# Patient Record
Sex: Male | Born: 1950 | Race: White | Hispanic: No | Marital: Married | Smoking: Never smoker
Health system: Southern US, Community
[De-identification: ages and names within clinical notes are randomized; demographics above are authoritative.]

## PROBLEM LIST (undated history)

## (undated) DIAGNOSIS — I219 Acute myocardial infarction, unspecified: Secondary | ICD-10-CM

## (undated) DIAGNOSIS — E119 Type 2 diabetes mellitus without complications: Secondary | ICD-10-CM

## (undated) DIAGNOSIS — E785 Hyperlipidemia, unspecified: Secondary | ICD-10-CM

## (undated) DIAGNOSIS — K219 Gastro-esophageal reflux disease without esophagitis: Secondary | ICD-10-CM

## (undated) DIAGNOSIS — I10 Essential (primary) hypertension: Secondary | ICD-10-CM

## (undated) DIAGNOSIS — M25512 Pain in left shoulder: Secondary | ICD-10-CM

## (undated) DIAGNOSIS — C4491 Basal cell carcinoma of skin, unspecified: Secondary | ICD-10-CM

## (undated) HISTORY — PX: CATARACT EXTRACTION: SUR2

## (undated) HISTORY — DX: Hyperlipidemia, unspecified: E78.5

## (undated) HISTORY — PX: EYE SURGERY: SHX253

## (undated) HISTORY — PX: CORONARY ANGIOPLASTY: SHX604

## (undated) HISTORY — DX: Pain in left shoulder: M25.512

## (undated) HISTORY — DX: Gastro-esophageal reflux disease without esophagitis: K21.9

## (undated) HISTORY — PX: RETINAL DETACHMENT SURGERY: SHX105

## (undated) HISTORY — DX: Basal cell carcinoma of skin, unspecified: C44.91

## (undated) HISTORY — DX: Acute myocardial infarction, unspecified: I21.9

## (undated) HISTORY — DX: Essential (primary) hypertension: I10

---

## 2006-04-26 ENCOUNTER — Ambulatory Visit: Payer: Self-pay | Admitting: Unknown Physician Specialty

## 2012-05-30 DIAGNOSIS — E669 Obesity, unspecified: Secondary | ICD-10-CM | POA: Insufficient documentation

## 2014-06-06 DIAGNOSIS — I2510 Atherosclerotic heart disease of native coronary artery without angina pectoris: Secondary | ICD-10-CM | POA: Insufficient documentation

## 2014-06-06 DIAGNOSIS — I10 Essential (primary) hypertension: Secondary | ICD-10-CM | POA: Insufficient documentation

## 2017-02-10 DIAGNOSIS — N5203 Combined arterial insufficiency and corporo-venous occlusive erectile dysfunction: Secondary | ICD-10-CM | POA: Insufficient documentation

## 2017-02-11 DIAGNOSIS — E119 Type 2 diabetes mellitus without complications: Secondary | ICD-10-CM | POA: Insufficient documentation

## 2017-04-13 ENCOUNTER — Other Ambulatory Visit: Payer: Self-pay | Admitting: Neurology

## 2017-04-13 DIAGNOSIS — R29898 Other symptoms and signs involving the musculoskeletal system: Secondary | ICD-10-CM

## 2017-04-20 ENCOUNTER — Ambulatory Visit
Admission: RE | Admit: 2017-04-20 | Discharge: 2017-04-20 | Disposition: A | Discharge status: Home / Self Care | Payer: Medicare Other | Source: Ambulatory Visit | Attending: Neurology | Admitting: Neurology

## 2017-04-20 DIAGNOSIS — R29898 Other symptoms and signs involving the musculoskeletal system: Secondary | ICD-10-CM

## 2017-05-04 ENCOUNTER — Encounter: Payer: Self-pay | Admitting: Dietician

## 2017-05-04 ENCOUNTER — Encounter: Payer: Medicare Other | Attending: Internal Medicine | Admitting: Dietician

## 2017-05-04 VITALS — Ht 74.0 in | Wt 260.2 lb

## 2017-05-04 DIAGNOSIS — Z713 Dietary counseling and surveillance: Secondary | ICD-10-CM | POA: Diagnosis present

## 2017-05-04 DIAGNOSIS — E119 Type 2 diabetes mellitus without complications: Secondary | ICD-10-CM

## 2017-05-04 NOTE — Progress Notes (Signed)
Diabetes Self-Management Education  Visit Type: First/Initial  Appt. Start Time: 0920 Appt. End Time: 1020  05/04/2017  Mr. Tammi Klippel, identified by name and date of birth, is a 66 y.o. male with a diagnosis of Diabetes: Type 2.   ASSESSMENT  Height 6\' 2"  (1.88 m), weight 260 lb 3.2 oz (118 kg). Body mass index is 33.41 kg/m.      Diabetes Self-Management Education - 05/04/17 1058      Visit Information   Visit Type First/Initial     Initial Visit   Diabetes Type Type 2     Psychosocial Assessment   Patient Belief/Attitude about Diabetes Motivated to manage diabetes   Self-care barriers None   Self-management support Doctor's office;Family   Other persons present Patient   Patient Concerns Glycemic Control;Weight Control;Monitoring;Healthy Lifestyle;Problem Solving;Nutrition/Meal planning;Medication  become more fit; prevent complications   Special Needs None   Preferred Learning Style Auditory;Visual;Hands on   Learning Readiness Ready   What is the last grade level you completed in school? college      Pre-Education Assessment   Patient understands the diabetes disease and treatment process. Needs Instruction   Patient understands incorporating nutritional management into lifestyle. Needs Instruction   Patient undertands incorporating physical activity into lifestyle. Needs Instruction   Patient understands using medications safely. Needs Instruction   Patient understands monitoring blood glucose, interpreting and using results Needs Review  uses One Touch Ultra 2 meter and checks FBG's daily with recent results mostly 100's-120's   Patient understands prevention, detection, and treatment of acute complications. Needs Instruction   Patient understands prevention, detection, and treatment of chronic complications. Needs Instruction   Patient understands how to develop strategies to address psychosocial issues. Needs Instruction   Patient understands how to develop  strategies to promote health/change behavior. Needs Instruction     Complications   Last HgB A1C per patient/outside source 6 %  04-19-17   How often do you check your blood sugar? 1-2 times/day   Fasting Blood glucose range (mg/dL) 70-129   Have you had a dilated eye exam in the past 12 months? No  spring 2017   Have you had a dental exam in the past 12 months? Yes  12-2016   Are you checking your feet? Yes   How many days per week are you checking your feet? 5     Dietary Intake   Breakfast --  often skips breakfast-eats breakfast at 7a if he eats   Snack (morning) --  none   Lunch --  eats lunch at 12p-eats snack foods 2-3x/wk.; pt limiting intake of carbohydrates and sugars and eating more fruits and vegetabes   Snack (afternoon) --  none   Dinner --  supper time varies 6p-8p   Snack (evening) --  eats occasional fruit    Beverage(s) --  drinks water 4-5x/day     Exercise   Exercise Type ADL's  active job     Patient Education   Previous Diabetes Education No   Disease state  Definition of diabetes, type 1 and 2, and the diagnosis of diabetes;Factors that contribute to the development of diabetes;Explored patient's options for treatment of their diabetes   Nutrition management  Role of diet in the treatment of diabetes and the relationship between the three main macronutrients and blood glucose level;Food label reading, portion sizes and measuring food.;Carbohydrate counting   Physical activity and exercise  Role of exercise on diabetes management, blood pressure control and cardiac health.;Helped patient identify  appropriate exercises in relation to his/her diabetes, diabetes complications and other health issue.   Medications Reviewed patients medication for diabetes, action, purpose, timing of dose and side effects.   Monitoring Purpose and frequency of SMBG.;Taught/discussed recording of test results and interpretation of SMBG.;Identified appropriate SMBG and/or A1C  goals.;Yearly dilated eye exam   Chronic complications Relationship between chronic complications and blood glucose control;Lipid levels, blood glucose control and heart disease;Dental care;Retinopathy and reason for yearly dilated eye exams;Reviewed with patient heart disease, higher risk of, and prevention   Personal strategies to promote health Lifestyle issues that need to be addressed for better diabetes care;Helped patient develop diabetes management plan for (enter comment)     Outcomes   Expected Outcomes Demonstrated interest in learning. Expect positive outcomes      Individualized Plan for Diabetes Self-Management Training:   Learning Objective:  Patient will have a greater understanding of diabetes self-management. Patient education plan is to attend individual and/or group sessions per assessed needs and concerns.   Plan:   Patient Instructions   Check blood sugars 1-2 x day before breakfast and 2 hrs after supper every day and record  Bring blood sugar records to the next appointment/class  Exercise: walk  for    15  minutes  3-4  days a week  Eat 3 meals day and 1  snack a day at bedtime or in afternoon if eating late supper  Eat 3 carbohydrate servings/meal + protein  Eat 1 carbohydrate serving/snack + protein  Space meals 4-6 hours apart  Avoid sugar sweetened drinks (soda, tea, coffee, sports drinks, juices)  Limit intake of fried foods, snack foods and sweets  Make an eye doctor appointment  Return for appointment/classes on:  05-31-17   Expected Outcomes:  Demonstrated interest in learning. Expect positive outcomes  Education material provided: General mel planning guidelines  If problems or questions, patient to contact team via: [phone]  Future DSME appointment:  05-31-17

## 2017-05-04 NOTE — Patient Instructions (Signed)
  Check blood sugars 1-2 x day before breakfast and 2 hrs after supper every day and record  Bring blood sugar records to the next appointment/class  Exercise: walk  for    15  minutes  3-4  days a week  Eat 3 meals day and 1  snack a day at bedtime or in afternoon if eating late supper  Eat 3 carbohydrate servings/meal + protein  Eat 1 carbohydrate serving/snack + protein  Space meals 4-6 hours apart  Avoid sugar sweetened drinks (soda, tea, coffee, sports drinks, juices)  Limit intake of fried foods, snack foods and sweets  Make an eye doctor appointment  Return for appointment/classes on:  05-31-17

## 2017-05-31 ENCOUNTER — Encounter: Payer: Medicare Other | Admitting: Dietician

## 2017-05-31 ENCOUNTER — Encounter: Payer: Self-pay | Admitting: Dietician

## 2017-05-31 VITALS — Ht 74.0 in | Wt 257.4 lb

## 2017-05-31 DIAGNOSIS — E119 Type 2 diabetes mellitus without complications: Secondary | ICD-10-CM

## 2017-05-31 DIAGNOSIS — Z713 Dietary counseling and surveillance: Secondary | ICD-10-CM | POA: Diagnosis not present

## 2017-05-31 NOTE — Progress Notes (Signed)

## 2017-06-04 ENCOUNTER — Other Ambulatory Visit: Payer: Self-pay | Admitting: Neurology

## 2017-06-04 DIAGNOSIS — M542 Cervicalgia: Secondary | ICD-10-CM

## 2017-06-04 DIAGNOSIS — M79602 Pain in left arm: Secondary | ICD-10-CM

## 2017-06-04 DIAGNOSIS — R29898 Other symptoms and signs involving the musculoskeletal system: Secondary | ICD-10-CM

## 2017-06-07 ENCOUNTER — Encounter: Payer: Self-pay | Admitting: Dietician

## 2017-06-07 ENCOUNTER — Encounter: Payer: Medicare Other | Attending: Internal Medicine | Admitting: Dietician

## 2017-06-07 VITALS — Wt 259.0 lb

## 2017-06-07 DIAGNOSIS — Z713 Dietary counseling and surveillance: Secondary | ICD-10-CM | POA: Insufficient documentation

## 2017-06-07 DIAGNOSIS — E119 Type 2 diabetes mellitus without complications: Secondary | ICD-10-CM

## 2017-06-07 NOTE — Progress Notes (Signed)

## 2017-06-14 ENCOUNTER — Encounter: Payer: Self-pay | Admitting: Dietician

## 2017-06-14 ENCOUNTER — Ambulatory Visit: Payer: Medicare Other

## 2017-06-14 ENCOUNTER — Encounter: Payer: Medicare Other | Admitting: Dietician

## 2017-06-14 VITALS — BP 142/66 | Ht 74.0 in | Wt 263.9 lb

## 2017-06-14 DIAGNOSIS — Z713 Dietary counseling and surveillance: Secondary | ICD-10-CM | POA: Diagnosis not present

## 2017-06-14 DIAGNOSIS — E119 Type 2 diabetes mellitus without complications: Secondary | ICD-10-CM

## 2017-06-14 NOTE — Progress Notes (Signed)

## 2017-06-15 ENCOUNTER — Ambulatory Visit
Admission: RE | Admit: 2017-06-15 | Discharge: 2017-06-15 | Disposition: A | Discharge status: Home / Self Care | Payer: Medicare Other | Source: Ambulatory Visit | Attending: Neurology | Admitting: Neurology

## 2017-06-15 DIAGNOSIS — M5021 Other cervical disc displacement,  high cervical region: Secondary | ICD-10-CM | POA: Diagnosis not present

## 2017-06-15 DIAGNOSIS — M50222 Other cervical disc displacement at C5-C6 level: Secondary | ICD-10-CM | POA: Diagnosis not present

## 2017-06-15 DIAGNOSIS — R29898 Other symptoms and signs involving the musculoskeletal system: Secondary | ICD-10-CM | POA: Diagnosis not present

## 2017-06-15 DIAGNOSIS — M4802 Spinal stenosis, cervical region: Secondary | ICD-10-CM | POA: Insufficient documentation

## 2017-06-15 DIAGNOSIS — M542 Cervicalgia: Secondary | ICD-10-CM

## 2017-06-15 DIAGNOSIS — M79602 Pain in left arm: Secondary | ICD-10-CM | POA: Diagnosis present

## 2017-06-21 ENCOUNTER — Encounter: Payer: Self-pay | Admitting: Dietician

## 2018-03-28 IMAGING — MR MR CERVICAL SPINE W/O CM
5 series · 33 of 48 positions shown · non-contrast
Comparison: None.

CLINICAL DATA: Neck and shoulder pain. Left greater than right.
Pain radiates to the elbow. Weakness in both arms.

EXAM:
MRI CERVICAL SPINE WITHOUT CONTRAST
TECHNIQUE: Multiplanar, multisequence MR imaging of the cervical spine was
performed. No intravenous contrast was administered.

[Series 4: T2 · sagittal · 3.0mm · 0.70mm/px · 6 of 13 slices shown (1 of 2)]
[im 1/13]
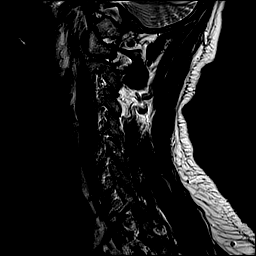
[im 3/13]
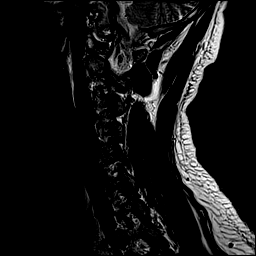
[im 5/13]
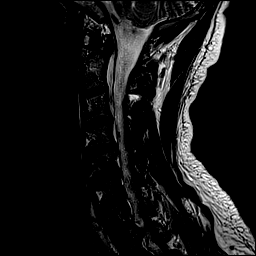
[im 8/13]
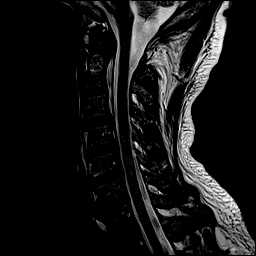
[im 10/13]
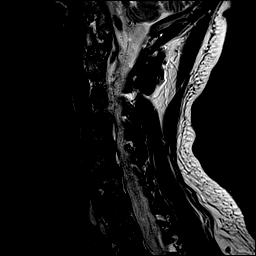
[im 13/13]
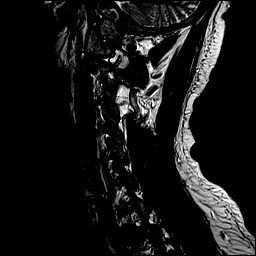

[Series 5: T1 · sagittal · 3.0mm · 0.70mm/px · 7 of 13 slices shown]
[im 1/13]
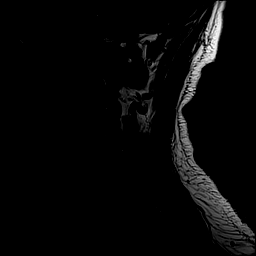
[im 3/13]
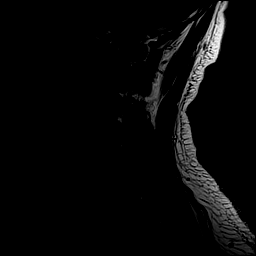
[im 5/13]
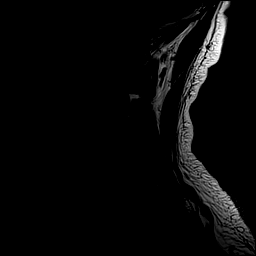
[im 7/13]
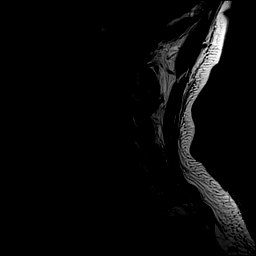
[im 9/13]
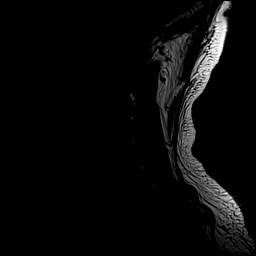
[im 11/13]
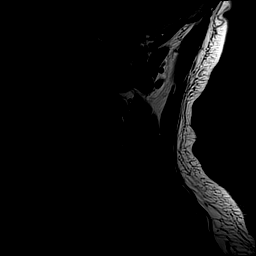
[im 13/13]
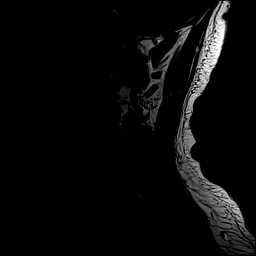

[Series 6: STIR · sagittal · 3.0mm · 0.35mm/px · 7 of 13 slices shown]
[im 1/13]
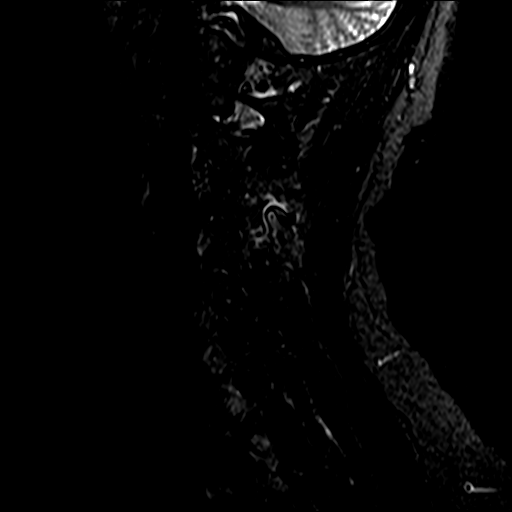
[im 3/13]
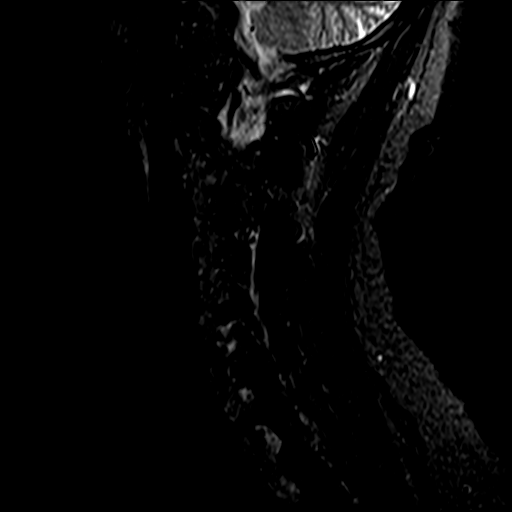
[im 5/13]
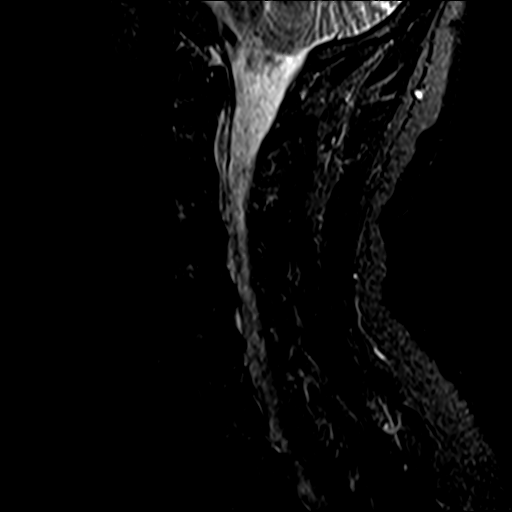
[im 7/13]
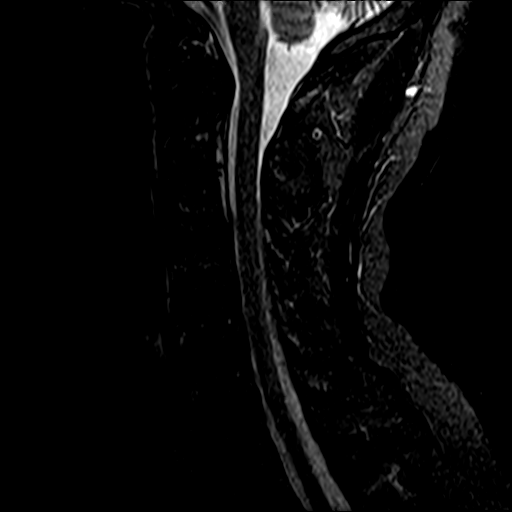
[im 9/13]
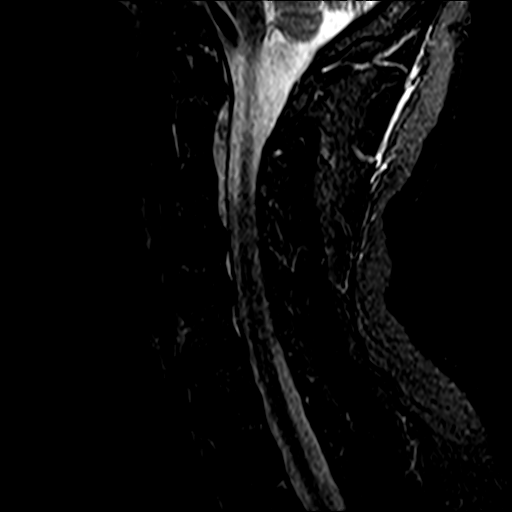
[im 11/13]
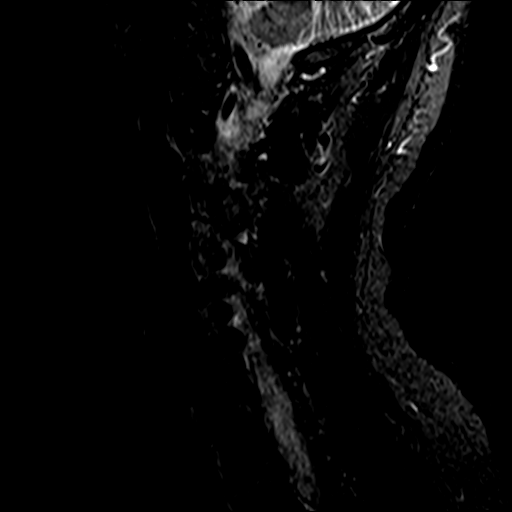
[im 13/13]
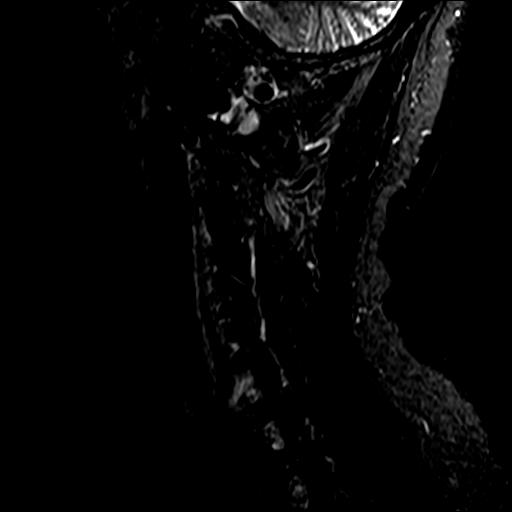

[Series 7: T2 · axial · 3.0mm · 0.70mm/px · z∈[-23,+80]mm · 8 of 28 slices shown (2 of 2)]
[im 1/28]
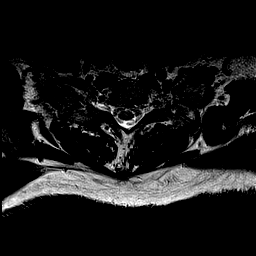
[im 5/28]
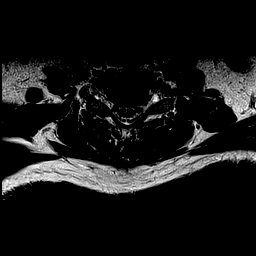
[im 9/28]
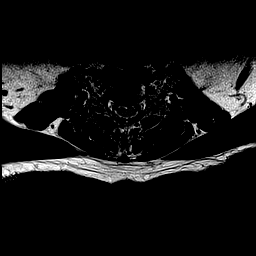
[im 13/28]
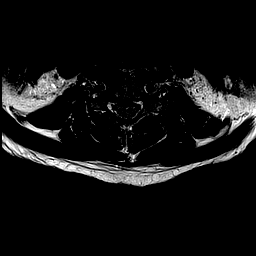
[im 15/28]
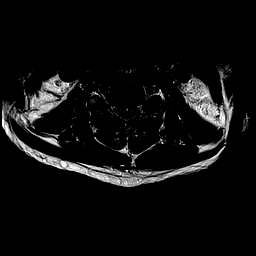
[im 19/28]
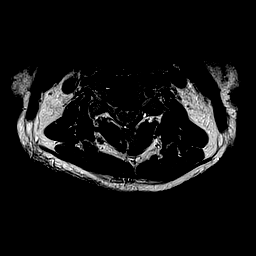
[im 23/28]
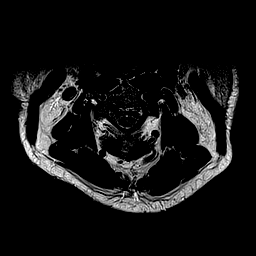
[im 28/28]
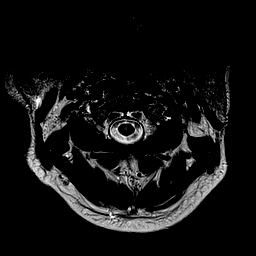

[Series 8: mpgr ax · axial · 3.0mm · 0.35mm/px · z∈[-23,+31]mm · 5 of 28 slices shown]
[im 1/28]
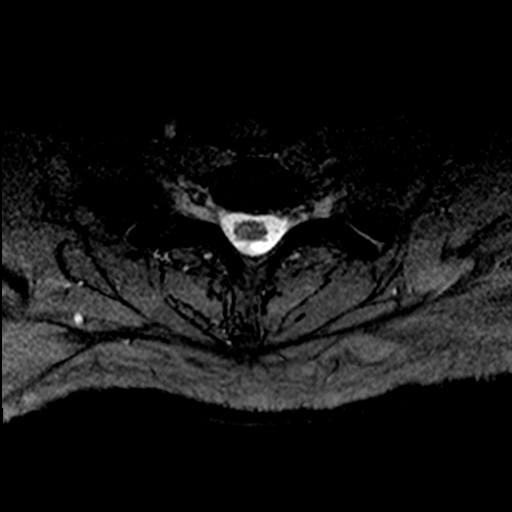
[im 5/28]
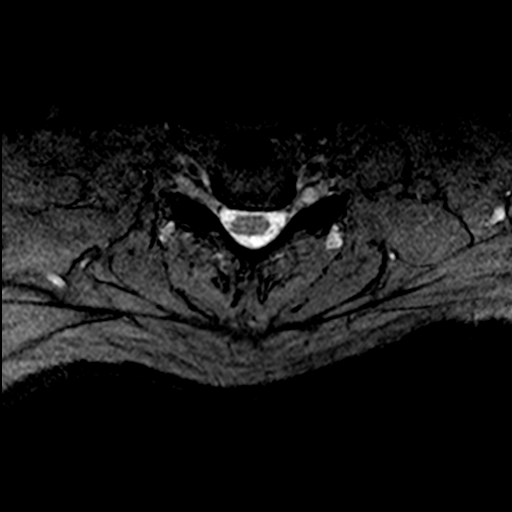
[im 9/28]
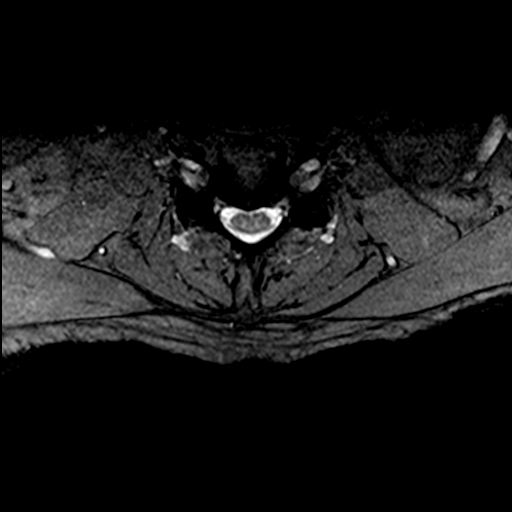
[im 13/28]
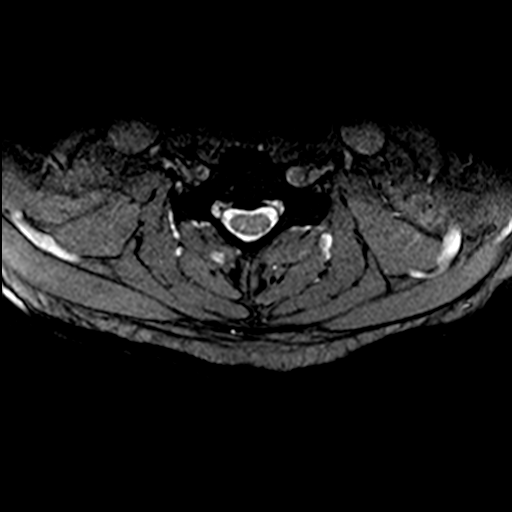
[im 15/28]
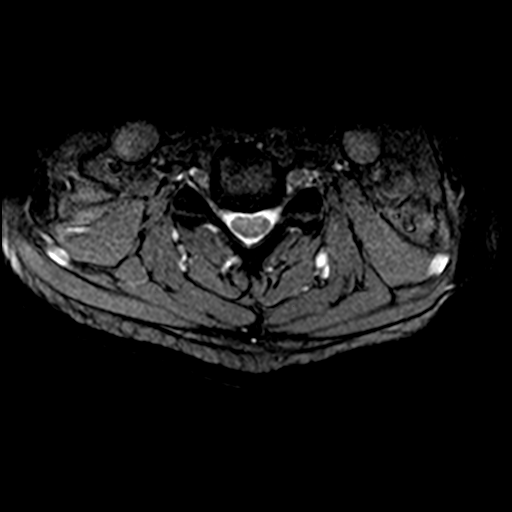

[33 of 48 positions shown; findings below may reference images not displayed]

FINDINGS: Alignment: Physiologic.

Vertebrae: No fracture, evidence of discitis, or bone lesion.

Cord: Normal signal and morphology.

Posterior Fossa, vertebral arteries, paraspinal tissues: Posterior
fossa demonstrates no focal abnormality. Vertebral artery flow voids
are maintained. Paraspinal soft tissues are unremarkable.

Disc levels:

Discs: Degenerative disc disease with disc height loss at C5-6.

C2-3: No significant disc bulge. No neural foraminal stenosis. No
central canal stenosis.

C3-4: Small central disc protrusion. Left uncovertebral degenerative
changes. Mild left foraminal stenosis. No right foraminal stenosis.
No central canal stenosis.

C4-5: Broad-based disc bulge. Bilateral uncovertebral degenerative
changes. Moderate right and mild-moderate left foraminal stenosis.
No central canal stenosis.

C5-6: Broad-based disc bulge. Bilateral uncovertebral degenerative
changes. Moderate bilateral foraminal stenosis. No central canal
stenosis.

C6-7: No significant disc bulge. No neural foraminal stenosis. No
central canal stenosis.

C7-T1: No significant disc bulge. No neural foraminal stenosis. No
central canal stenosis.
IMPRESSION: 1. At C3-4 there is a small central disc protrusion. Left
uncovertebral degenerative changes with mild left foraminal
stenosis.
2. At C4-5 there is a broad-based disc bulge. Bilateral
uncovertebral degenerative changes. Moderate right and mild-moderate
left foraminal stenosis.
3. At C5-6 there is a broad-based disc bulge. Bilateral
uncovertebral degenerative changes. Moderate bilateral foraminal
stenosis.

## 2019-08-21 ENCOUNTER — Other Ambulatory Visit: Payer: Self-pay | Admitting: Family Medicine

## 2019-08-21 ENCOUNTER — Other Ambulatory Visit: Payer: Self-pay | Admitting: Gastroenterology

## 2019-08-21 DIAGNOSIS — Z860101 Personal history of adenomatous and serrated colon polyps: Secondary | ICD-10-CM

## 2019-08-21 DIAGNOSIS — Z86010 Personal history of colonic polyps: Secondary | ICD-10-CM

## 2019-08-22 ENCOUNTER — Other Ambulatory Visit: Payer: Self-pay | Admitting: Gastroenterology

## 2019-08-22 DIAGNOSIS — Z86010 Personal history of colonic polyps: Secondary | ICD-10-CM

## 2019-08-22 DIAGNOSIS — R748 Abnormal levels of other serum enzymes: Secondary | ICD-10-CM

## 2019-08-30 ENCOUNTER — Ambulatory Visit
Admission: RE | Admit: 2019-08-30 | Discharge: 2019-08-30 | Disposition: A | Discharge status: Home / Self Care | Payer: Medicare Other | Source: Ambulatory Visit | Attending: Gastroenterology | Admitting: Gastroenterology

## 2019-08-30 ENCOUNTER — Other Ambulatory Visit: Payer: Self-pay

## 2019-08-30 DIAGNOSIS — R748 Abnormal levels of other serum enzymes: Secondary | ICD-10-CM | POA: Diagnosis present

## 2019-08-30 DIAGNOSIS — Z86010 Personal history of colonic polyps: Secondary | ICD-10-CM | POA: Insufficient documentation

## 2019-11-02 ENCOUNTER — Emergency Department: Payer: Medicare Other

## 2019-11-02 ENCOUNTER — Other Ambulatory Visit: Payer: Self-pay

## 2019-11-02 ENCOUNTER — Inpatient Hospital Stay
Admission: EM | Admit: 2019-11-02 | Discharge: 2019-11-05 | DRG: 871 | Disposition: A | Discharge status: Home / Self Care | Payer: Medicare Other | Attending: Internal Medicine | Admitting: Internal Medicine

## 2019-11-02 DIAGNOSIS — Z7984 Long term (current) use of oral hypoglycemic drugs: Secondary | ICD-10-CM

## 2019-11-02 DIAGNOSIS — K219 Gastro-esophageal reflux disease without esophagitis: Secondary | ICD-10-CM | POA: Diagnosis present

## 2019-11-02 DIAGNOSIS — Z79899 Other long term (current) drug therapy: Secondary | ICD-10-CM

## 2019-11-02 DIAGNOSIS — E872 Acidosis: Secondary | ICD-10-CM | POA: Diagnosis present

## 2019-11-02 DIAGNOSIS — E162 Hypoglycemia, unspecified: Secondary | ICD-10-CM | POA: Diagnosis not present

## 2019-11-02 DIAGNOSIS — E785 Hyperlipidemia, unspecified: Secondary | ICD-10-CM | POA: Diagnosis present

## 2019-11-02 DIAGNOSIS — B9620 Unspecified Escherichia coli [E. coli] as the cause of diseases classified elsewhere: Secondary | ICD-10-CM | POA: Diagnosis present

## 2019-11-02 DIAGNOSIS — G9341 Metabolic encephalopathy: Secondary | ICD-10-CM | POA: Diagnosis present

## 2019-11-02 DIAGNOSIS — L03115 Cellulitis of right lower limb: Secondary | ICD-10-CM | POA: Diagnosis not present

## 2019-11-02 DIAGNOSIS — E669 Obesity, unspecified: Secondary | ICD-10-CM | POA: Diagnosis present

## 2019-11-02 DIAGNOSIS — E876 Hypokalemia: Secondary | ICD-10-CM | POA: Diagnosis present

## 2019-11-02 DIAGNOSIS — I252 Old myocardial infarction: Secondary | ICD-10-CM

## 2019-11-02 DIAGNOSIS — Z20822 Contact with and (suspected) exposure to covid-19: Secondary | ICD-10-CM | POA: Diagnosis present

## 2019-11-02 DIAGNOSIS — A419 Sepsis, unspecified organism: Secondary | ICD-10-CM | POA: Diagnosis not present

## 2019-11-02 DIAGNOSIS — Z7289 Other problems related to lifestyle: Secondary | ICD-10-CM

## 2019-11-02 DIAGNOSIS — E119 Type 2 diabetes mellitus without complications: Secondary | ICD-10-CM

## 2019-11-02 DIAGNOSIS — M25473 Effusion, unspecified ankle: Secondary | ICD-10-CM

## 2019-11-02 DIAGNOSIS — R7401 Elevation of levels of liver transaminase levels: Secondary | ICD-10-CM | POA: Diagnosis present

## 2019-11-02 DIAGNOSIS — I10 Essential (primary) hypertension: Secondary | ICD-10-CM | POA: Diagnosis present

## 2019-11-02 DIAGNOSIS — Z79890 Hormone replacement therapy (postmenopausal): Secondary | ICD-10-CM

## 2019-11-02 DIAGNOSIS — I2510 Atherosclerotic heart disease of native coronary artery without angina pectoris: Secondary | ICD-10-CM | POA: Diagnosis present

## 2019-11-02 DIAGNOSIS — Z7982 Long term (current) use of aspirin: Secondary | ICD-10-CM

## 2019-11-02 DIAGNOSIS — E11649 Type 2 diabetes mellitus with hypoglycemia without coma: Secondary | ICD-10-CM | POA: Diagnosis not present

## 2019-11-02 DIAGNOSIS — D696 Thrombocytopenia, unspecified: Secondary | ICD-10-CM | POA: Diagnosis present

## 2019-11-02 DIAGNOSIS — Z888 Allergy status to other drugs, medicaments and biological substances status: Secondary | ICD-10-CM

## 2019-11-02 DIAGNOSIS — Z6835 Body mass index (BMI) 35.0-35.9, adult: Secondary | ICD-10-CM

## 2019-11-02 DIAGNOSIS — L0390 Cellulitis, unspecified: Secondary | ICD-10-CM | POA: Diagnosis present

## 2019-11-02 DIAGNOSIS — N390 Urinary tract infection, site not specified: Secondary | ICD-10-CM | POA: Diagnosis present

## 2019-11-02 HISTORY — DX: Type 2 diabetes mellitus without complications: E11.9

## 2019-11-02 LAB — CBC WITH DIFFERENTIAL/PLATELET
Abs Immature Granulocytes: 0.3 10*3/uL — ABNORMAL HIGH (ref 0.00–0.07)
Basophils Absolute: 0.1 10*3/uL (ref 0.0–0.1)
Basophils Relative: 0 %
Eosinophils Absolute: 0 10*3/uL (ref 0.0–0.5)
Eosinophils Relative: 0 %
HCT: 43.5 % (ref 39.0–52.0)
Hemoglobin: 14.8 g/dL (ref 13.0–17.0)
Immature Granulocytes: 2 %
Lymphocytes Relative: 18 %
Lymphs Abs: 3.2 10*3/uL (ref 0.7–4.0)
MCH: 35.2 pg — ABNORMAL HIGH (ref 26.0–34.0)
MCHC: 34 g/dL (ref 30.0–36.0)
MCV: 103.6 fL — ABNORMAL HIGH (ref 80.0–100.0)
Monocytes Absolute: 1.5 10*3/uL — ABNORMAL HIGH (ref 0.1–1.0)
Monocytes Relative: 8 %
Neutro Abs: 13.1 10*3/uL — ABNORMAL HIGH (ref 1.7–7.7)
Neutrophils Relative %: 72 %
Platelets: 80 10*3/uL — ABNORMAL LOW (ref 150–400)
RBC: 4.2 MIL/uL — ABNORMAL LOW (ref 4.22–5.81)
RDW: 14.4 % (ref 11.5–15.5)
WBC: 18.2 10*3/uL — ABNORMAL HIGH (ref 4.0–10.5)
nRBC: 0 % (ref 0.0–0.2)

## 2019-11-02 LAB — URINALYSIS, COMPLETE (UACMP) WITH MICROSCOPIC
Bacteria, UA: NONE SEEN
Bilirubin Urine: NEGATIVE
Glucose, UA: 50 mg/dL — AB
Ketones, ur: NEGATIVE mg/dL
Leukocytes,Ua: NEGATIVE
Nitrite: NEGATIVE
Protein, ur: NEGATIVE mg/dL
Specific Gravity, Urine: 1.012 (ref 1.005–1.030)
Squamous Epithelial / LPF: NONE SEEN (ref 0–5)
pH: 6 (ref 5.0–8.0)

## 2019-11-02 LAB — COMPREHENSIVE METABOLIC PANEL
ALT: 67 U/L — ABNORMAL HIGH (ref 0–44)
AST: 98 U/L — ABNORMAL HIGH (ref 15–41)
Albumin: 3.1 g/dL — ABNORMAL LOW (ref 3.5–5.0)
Alkaline Phosphatase: 45 U/L (ref 38–126)
Anion gap: 7 (ref 5–15)
BUN: 24 mg/dL — ABNORMAL HIGH (ref 8–23)
CO2: 24 mmol/L (ref 22–32)
Calcium: 8.6 mg/dL — ABNORMAL LOW (ref 8.9–10.3)
Chloride: 103 mmol/L (ref 98–111)
Creatinine, Ser: 0.99 mg/dL (ref 0.61–1.24)
GFR calc Af Amer: >60 mL/min (ref >60)
GFR calc non Af Amer: >60 mL/min (ref >60)
Glucose, Bld: 243 mg/dL — ABNORMAL HIGH (ref 70–99)
Potassium: 4.1 mmol/L (ref 3.5–5.1)
Sodium: 134 mmol/L — ABNORMAL LOW (ref 135–145)
Total Bilirubin: 1.7 mg/dL — ABNORMAL HIGH (ref 0.3–1.2)
Total Protein: 6.6 g/dL (ref 6.5–8.1)

## 2019-11-02 LAB — PROTIME-INR
INR: 1.3 — ABNORMAL HIGH (ref 0.8–1.2)
Prothrombin Time: 16.5 seconds — ABNORMAL HIGH (ref 11.4–15.2)

## 2019-11-02 LAB — APTT: aPTT: 37 seconds — ABNORMAL HIGH (ref 24–36)

## 2019-11-02 LAB — RESPIRATORY PANEL BY RT PCR (FLU A&B, COVID)
Influenza A by PCR: NEGATIVE
Influenza B by PCR: NEGATIVE
SARS Coronavirus 2 by RT PCR: NEGATIVE

## 2019-11-02 LAB — GLUCOSE, CAPILLARY
Glucose-Capillary: 34 mg/dL — CL (ref 70–99)
Glucose-Capillary: 125 mg/dL — ABNORMAL HIGH (ref 70–99)
Glucose-Capillary: 44 mg/dL — CL (ref 70–99)
Glucose-Capillary: 112 mg/dL — ABNORMAL HIGH (ref 70–99)
Glucose-Capillary: 61 mg/dL — ABNORMAL LOW (ref 70–99)
Glucose-Capillary: 75 mg/dL (ref 70–99)

## 2019-11-02 LAB — LACTATE DEHYDROGENASE: LDH: 169 U/L (ref 98–192)

## 2019-11-02 LAB — LACTIC ACID, PLASMA: Lactic Acid, Venous: 2.8 mmol/L (ref 0.5–1.9)

## 2019-11-02 LAB — SEDIMENTATION RATE: Sed Rate: 70 mm/hr — ABNORMAL HIGH (ref 0–20)

## 2019-11-02 LAB — SAVE SMEAR(SSMR), FOR PROVIDER SLIDE REVIEW

## 2019-11-02 LAB — HIV ANTIBODY (ROUTINE TESTING W REFLEX): HIV Screen 4th Generation wRfx: NONREACTIVE

## 2019-11-02 LAB — C-REACTIVE PROTEIN: CRP: 8.9 mg/dL — ABNORMAL HIGH (ref <1.0)

## 2019-11-02 MED ORDER — VITAMIN B-12 1000 MCG PO TABS
2000.0000 ug | ORAL_TABLET | Freq: Every day | ORAL | Status: DC
  Administered 2019-11-02 – 2019-11-05 (×4): 2000 ug via ORAL
  Filled 2019-11-02 (×4): qty 2

## 2019-11-02 MED ORDER — HYDROMORPHONE HCL 1 MG/ML IJ SOLN: 1.0000 mg | INTRAMUSCULAR | Status: DC | PRN

## 2019-11-02 MED ORDER — LEVOTHYROXINE SODIUM 50 MCG PO TABS
50.0000 ug | ORAL_TABLET | Freq: Every morning | ORAL | Status: DC
  Administered 2019-11-03 – 2019-11-05 (×3): 50 ug via ORAL
  Filled 2019-11-02 (×3): qty 1

## 2019-11-02 MED ORDER — RAMIPRIL 2.5 MG PO CAPS
2.5000 mg | ORAL_CAPSULE | Freq: Every day | ORAL | Status: DC
  Administered 2019-11-02 – 2019-11-05 (×4): 2.5 mg via ORAL
  Filled 2019-11-02 (×4): qty 1

## 2019-11-02 MED ORDER — FOLIC ACID 1 MG PO TABS
2.5000 mg | ORAL_TABLET | Freq: Every day | ORAL | Status: DC
  Administered 2019-11-02 – 2019-11-05 (×4): 2.5 mg via ORAL
  Filled 2019-11-02 (×4): qty 3

## 2019-11-02 MED ORDER — INSULIN ASPART 100 UNIT/ML ~~LOC~~ SOLN: 0.0000 [IU] | Freq: Every day | SUBCUTANEOUS | Status: DC

## 2019-11-02 MED ORDER — SIMVASTATIN 40 MG PO TABS
40.0000 mg | ORAL_TABLET | Freq: Every day | ORAL | Status: DC
  Administered 2019-11-02 – 2019-11-04 (×3): 40 mg via ORAL
  Filled 2019-11-02: qty 1
  Filled 2019-11-02: qty 4
  Filled 2019-11-02 (×2): qty 1

## 2019-11-02 MED ORDER — FA-PYRIDOXINE-CYANOCOBALAMIN 2.5-25-2 MG PO TABS: 1.0000 | ORAL_TABLET | Freq: Every day | ORAL | Status: DC

## 2019-11-02 MED ORDER — DEXTROSE 50 % IV SOLN
50.0000 mL | INTRAVENOUS | Status: DC | PRN
  Administered 2019-11-02: 50 mL via INTRAVENOUS
  Filled 2019-11-02: qty 50

## 2019-11-02 MED ORDER — GLUCOSE 40 % PO GEL
ORAL | Status: AC
  Administered 2019-11-02: 37.5 g
  Filled 2019-11-02: qty 1

## 2019-11-02 MED ORDER — OXYCODONE-ACETAMINOPHEN 5-325 MG PO TABS: 1.0000 | ORAL_TABLET | ORAL | Status: DC | PRN

## 2019-11-02 MED ORDER — SENNOSIDES-DOCUSATE SODIUM 8.6-50 MG PO TABS: 1.0000 | ORAL_TABLET | Freq: Every evening | ORAL | Status: DC | PRN

## 2019-11-02 MED ORDER — VANCOMYCIN HCL IN DEXTROSE 1-5 GM/200ML-% IV SOLN
1000.0000 mg | Freq: Once | INTRAVENOUS | Status: AC
  Administered 2019-11-02: 1000 mg via INTRAVENOUS
  Filled 2019-11-02: qty 200

## 2019-11-02 MED ORDER — SODIUM CHLORIDE 0.9 % IV SOLN
2.0000 g | Freq: Once | INTRAVENOUS | Status: AC
  Administered 2019-11-02: 2 g via INTRAVENOUS
  Filled 2019-11-02: qty 2

## 2019-11-02 MED ORDER — SODIUM CHLORIDE 0.9 % IV SOLN: INTRAVENOUS | Status: DC

## 2019-11-02 MED ORDER — DEXTROSE-NACL 5-0.9 % IV SOLN: INTRAVENOUS | Status: DC

## 2019-11-02 MED ORDER — INSULIN ASPART 100 UNIT/ML ~~LOC~~ SOLN: 0.0000 [IU] | Freq: Three times a day (TID) | SUBCUTANEOUS | Status: DC

## 2019-11-02 MED ORDER — ONDANSETRON HCL 4 MG/2ML IJ SOLN: 4.0000 mg | Freq: Three times a day (TID) | INTRAMUSCULAR | Status: DC | PRN

## 2019-11-02 MED ORDER — VANCOMYCIN HCL 1500 MG/300ML IV SOLN
1500.0000 mg | INTRAVENOUS | Status: DC
  Administered 2019-11-02 – 2019-11-04 (×3): 1500 mg via INTRAVENOUS
  Filled 2019-11-02 (×3): qty 300

## 2019-11-02 MED ORDER — SODIUM CHLORIDE 0.9 % IV BOLUS
1000.0000 mL | Freq: Once | INTRAVENOUS | Status: AC
  Administered 2019-11-02: 1000 mL via INTRAVENOUS

## 2019-11-02 MED ORDER — DEXTROSE 50 % IV SOLN: 50.0000 mL | INTRAVENOUS | Status: DC | PRN

## 2019-11-02 MED ORDER — VITAMIN B-6 50 MG PO TABS
25.0000 mg | ORAL_TABLET | Freq: Every day | ORAL | Status: DC
  Administered 2019-11-02 – 2019-11-05 (×4): 25 mg via ORAL
  Filled 2019-11-02 (×4): qty 0.5

## 2019-11-02 MED ORDER — VITAMIN B-12 1000 MCG PO TABS
1000.0000 ug | ORAL_TABLET | Freq: Every day | ORAL | Status: DC
  Administered 2019-11-02: 1000 ug via ORAL
  Filled 2019-11-02 (×2): qty 1

## 2019-11-02 MED ORDER — ACETAMINOPHEN 325 MG PO TABS
650.0000 mg | ORAL_TABLET | Freq: Four times a day (QID) | ORAL | Status: DC | PRN
  Administered 2019-11-02: 650 mg via ORAL
  Filled 2019-11-02: qty 2

## 2019-11-02 MED ORDER — HYDRALAZINE HCL 25 MG PO TABS: 25.0000 mg | ORAL_TABLET | Freq: Three times a day (TID) | ORAL | Status: DC | PRN

## 2019-11-02 NOTE — Progress Notes (Signed)
Pharmacy Antibiotic Note  Nicholas Abbott is a 68 y.o. male admitted on 11/02/2019 with cellulitis.  Pharmacy has been consulted for Vancomycin dosing.  Plan: Vancomycin 1500 mg IV Q 18 hrs. Goal AUC 400-550. Expected AUC: 435.3 SCr used: 0.99 Expected Cmin: 9.8 mcg/ml   Height: 6\' 2"  (188 cm) Weight: 280 lb (127 kg) IBW/kg (Calculated) : 82.2  Temp (24hrs), Avg:99 F (37.2 C), Min:99 F (37.2 C), Max:99 F (37.2 C)  Recent Labs  Lab 11/02/19 0941 11/02/19 0956  WBC 18.2*  --   CREATININE 0.99  --   LATICACIDVEN  --  2.8*    Estimated Creatinine Clearance: 99.7 mL/min (by C-G formula based on SCr of 0.99 mg/dL).    Allergies  Allergen Reactions  . Pioglitazone Rash    Antimicrobials this admission: Cefepime 12/31 >>  Vancomycin 12/31 >>   Dose adjustments this admission:  Microbiology results:  Thank you for allowing pharmacy to be a part of this patient's care.  Paulina Fusi, PharmD, BCPS 11/02/2019 3:09 PM

## 2019-11-02 NOTE — ED Notes (Signed)
Dr. Blaine Hamper notified of pt CBG, order to give D50 now

## 2019-11-02 NOTE — ED Notes (Signed)
Pt CBG 44. Pt alert and oriented, not symptomatic. Given graham crackers and peanut butter. Dr. Blaine Hamper notified. Will keep rechecking pt CBG

## 2019-11-02 NOTE — ED Provider Notes (Signed)
[facility] Emergency Department Provider Note  ____________________________________________   First MD Initiated Contact with Patient 11/02/19 1157     (approximate)  I have reviewed the triage vital signs and the nursing notes.   HISTORY  Chief Complaint Knee Pain and Hypoglycemia    HPI Nicholas Abbott is a 68 y.o. male  Here with right leg pain and swelling. Pt reports that over the past week, he's had mild increase in R>L leg pain and swelling. Over the past 2 days, he's noticed redness and warmth that has spread from his ankle up to his knee. He's had associated fevers to 104F at home with chills. The redness Is rapidly spreading. He has some mild anterior knee pain 2/2 swelling but can passively  range it. Denies any trauma. No h/o DVT/PE. No h/o recurrent cellulitis. Pain worse w/ movement, weightbearing. No alleviating factors.       Past Medical History:  Diagnosis Date  . Diabetes mellitus without complication ([place])   . GERD (gastroesophageal reflux disease)   . Hyperlipidemia   . Hypertension   . Left shoulder pain   . Myocardial infarction [facility])     Patient Active Problem List   Diagnosis Date Noted  . Hyperlipidemia   . Hypertension   . Diabetes mellitus without complication ([place])   . CAD (coronary artery disease)   . Thrombocytopenia ([place])   . Acute metabolic encephalopathy   . Hypoglycemia   . Sepsis ([place])   . Cellulitis of right lower extremity     Past Surgical History:  Procedure Laterality Date  . CATARACT EXTRACTION    . RETINAL DETACHMENT SURGERY      Prior to Admission medications   Medication Sig Start Date End Date Taking? Authorizing Provider  folic acid-pyridoxine-cyancobalamin (FOLBIC) 2.5-25-2 MG TABS tablet Take 1 tablet by mouth daily. 02/08/17  Yes [provider]  furosemide (LASIX) 40 MG tablet Take 40 mg by mouth daily. 09/15/19  Yes [provider]  glipiZIDE (GLUCOTROL XL) 10 MG 24  hr tablet Take 10 mg by mouth 2 (two) times daily. 08/08/19  Yes [provider]  levothyroxine (SYNTHROID) 50 MCG tablet Take 50 mcg by mouth every morning.  10/03/19  Yes [provider]  metFORMIN (GLUCOPHAGE-XR) 500 MG 24 hr tablet Take 1,000 tablets by mouth 2 (two) times daily. At supper 01/14/17 11/02/19 Yes [provider]  Nutritional Supplements (ANTIOXIDANT WITH CO Q-10 PO) Take 1 tablet by mouth daily.   Yes [provider]  ramipril (ALTACE) 2.5 MG capsule Take 2.5 mg by mouth daily. 10/03/19  Yes [provider]  sildenafil (REVATIO) 20 MG tablet Take 1 tablet by mouth daily.  11/16/16  Yes [provider]  simvastatin (ZOCOR) 40 MG tablet Take 1 tablet by mouth daily at 6 PM.  01/30/16  Yes [provider]  vitamin B-12 (CYANOCOBALAMIN) 1000 MCG tablet Take 1,000 mcg by mouth daily.   Yes [provider]    Allergies Pioglitazone  No family history on file.  Social History Social History   Tobacco Use  . Smoking status: Never Smoker  . Smokeless tobacco: Never Used  Substance Use Topics  . Alcohol use: Yes    Alcohol/week: 5.0 standard drinks    Types: 5 Shots of liquor per week  . Drug use: Not on file    Review of Systems  Review of Systems  Constitutional: Positive for chills, fatigue and fever.  HENT: Negative for sore throat.  Respiratory: Negative for shortness of breath.   Cardiovascular: Positive for leg swelling. Negative for chest pain.  Gastrointestinal: Positive for nausea. Negative for abdominal pain.  Genitourinary: Negative for flank pain.  Musculoskeletal: Negative for neck pain.  Skin: Positive for rash. Negative for wound.  Allergic/Immunologic: Negative for immunocompromised state.  Neurological: Positive for weakness. Negative for numbness.  Hematological: Does not bruise/bleed easily.  All other systems reviewed and are negative.     ____________________________________________  PHYSICAL EXAM:      VITAL SIGNS: ED Triage Vitals  Enc Vitals Group     BP --      Pulse Rate 11/02/19 0930 72     Resp 11/02/19 0930 17     Temp 11/02/19 0935 99 F (37.2 C)     Temp Source 11/02/19 0930 Oral     SpO2 11/02/19 0930 97 %     Weight 11/02/19 0930 280 lb (127 kg)     Height 11/02/19 0930 6\' 2"  (1.88 m)     Head Circumference --      Peak Flow --      Pain Score 11/02/19 0930 9     Pain Loc --      Pain Edu? --      Excl. in [place]? --      Physical Exam Vitals and nursing note reviewed.  Constitutional:      General: He is not in acute distress.    Appearance: He is well-developed.  HENT:     Head: Normocephalic and atraumatic.     Mouth/Throat:     Mouth: Mucous membranes are dry.  Eyes:     Conjunctiva/sclera: Conjunctivae normal.  Cardiovascular:     Rate and Rhythm: Normal rate and regular rhythm.     Heart sounds: Normal heart sounds. No murmur. No friction rub.     Comments: 1+ DP/PT bilaterally Pulmonary:     Effort: Pulmonary effort is normal. No respiratory distress.     Breath sounds: Normal breath sounds. No wheezing or rales.  Abdominal:     General: Abdomen is flat. There is no distension.     Palpations: Abdomen is soft.     Tenderness: There is no abdominal tenderness.  Musculoskeletal:     Cervical back: Neck supple.  Skin:    General: Skin is warm.     Capillary Refill: Capillary refill takes less than 2 seconds.     Comments: Bilateral 2+ pitting edema, but R>L. Erythema extending from right knee to foot with marked warmth, tenderness. There is slight increase in redness overlying the knee but no appreciable joint effusion, and patient is able to passively range knee >45 degrees w/o significant pain.   Neurological:     Mental Status: He is alert and oriented to person, place, and time.     Motor: No abnormal muscle tone.       ____________________________________________    LABS (all labs ordered are listed, but only abnormal results are displayed)  Labs Reviewed  COMPREHENSIVE METABOLIC PANEL - Abnormal; Notable for the following components:      Result Value   Sodium 134 (*)    Glucose, Bld 243 (*)    BUN 24 (*)    Calcium 8.6 (*)    Albumin 3.1 (*)    AST 98 (*)    ALT 67 (*)    Total Bilirubin 1.7 (*)    All other components within normal limits  LACTIC ACID, PLASMA - Abnormal; Notable for the following  components:   Lactic Acid, Venous 2.8 (*)    All other components within normal limits  CBC WITH DIFFERENTIAL/PLATELET - Abnormal; Notable for the following components:   WBC 18.2 (*)    RBC 4.20 (*)    MCV 103.6 (*)    MCH 35.2 (*)    Platelets 80 (*)    Neutro Abs 13.1 (*)    Monocytes Absolute 1.5 (*)    Abs Immature Granulocytes 0.30 (*)    All other components within normal limits  PROTIME-INR - Abnormal; Notable for the following components:   Prothrombin Time 16.5 (*)    INR 1.3 (*)    All other components within normal limits  URINALYSIS, COMPLETE (UACMP) WITH MICROSCOPIC - Abnormal; Notable for the following components:   Color, Urine YELLOW (*)    APPearance HAZY (*)    Glucose, UA 50 (*)    Hgb urine dipstick LARGE (*)    All other components within normal limits  APTT - Abnormal; Notable for the following components:   aPTT 37 (*)    All other components within normal limits  SEDIMENTATION RATE - Abnormal; Notable for the following components:   Sed Rate 70 (*)    All other components within normal limits  RESPIRATORY PANEL BY RT PCR (FLU A&B, COVID)  CULTURE, BLOOD (ROUTINE X 2)  CULTURE, BLOOD (ROUTINE X 2)  URINE CULTURE  LACTATE DEHYDROGENASE  SAVE SMEAR (SSMR)  LACTIC ACID, PLASMA  URINALYSIS, ROUTINE W REFLEX MICROSCOPIC  PATHOLOGIST SMEAR REVIEW  C-REACTIVE PROTEIN  HIV ANTIBODY (ROUTINE TESTING W REFLEX)  BASIC METABOLIC PANEL  CBC    ____________________________________________  EKG: Normal sinus  rhythm, VR 80. QRS 110, QTc 430. Multiform PVCs. No acute ST elevations.   EKG Interpretation  Date/Time:  Thursday November 02 2019 12:47:38 EST Ventricular Rate:  80 PR Interval:    QRS Duration: 110 QT Interval:  372 QTC Calculation: 430 R Axis:   107 Text Interpretation: Sinus rhythm Multiform ventricular premature complexes Short PR interval Anterior infarct, old Confirmed by UNCONFIRMED, DOCTOR ([id number]), editor Mel Almond, Tammy [phone]) on 11/02/2019 12:57:42 PM       ________________________________________  RADIOLOGY All imaging, including plain films, CT scans, and ultrasounds, independently reviewed by me, and interpretations confirmed via formal radiology reads.  ED MD interpretation:   DVT study: No evidence of DVT XR Knee: no fx, no effusion  Official radiology report(s): US Venous Img Lower Unilateral Right  Result Date: 11/02/2019 CLINICAL DATA:  Right leg swelling and pain EXAM: RIGHT LOWER EXTREMITY VENOUS DOPPLER ULTRASOUND TECHNIQUE: Gray-scale sonography with graded compression, as well as color Doppler and duplex ultrasound were performed to evaluate the lower extremity deep venous systems from the level of the common femoral vein and including the common femoral, femoral, profunda femoral, popliteal and calf veins including the posterior tibial, peroneal and gastrocnemius veins when visible. The superficial great saphenous vein was also interrogated. Spectral Doppler was utilized to evaluate flow at rest and with distal augmentation maneuvers in the common femoral, femoral and popliteal veins. COMPARISON:  None. FINDINGS: Contralateral Common Femoral Vein: Respiratory phasicity is normal and symmetric with the symptomatic side. No evidence of thrombus. Normal compressibility. Common Femoral Vein: No evidence of thrombus. Normal compressibility, respiratory phasicity and response to augmentation. Saphenofemoral Junction: No evidence of thrombus. Normal compressibility and  flow on color Doppler imaging. Profunda Femoral Vein: No evidence of thrombus. Normal compressibility and flow on color Doppler imaging. Femoral Vein: No evidence of thrombus. Normal compressibility, respiratory  phasicity and response to augmentation. Popliteal Vein: No evidence of thrombus. Normal compressibility, respiratory phasicity and response to augmentation. Calf Veins: No evidence of thrombus. Normal compressibility and flow on color Doppler imaging. IMPRESSION: No evidence of deep venous thrombosis. Electronically Signed   By: Jerilynn Mages.  Shick M.D.   On: 11/02/2019 14:56   DG Knee Complete 4 Views Right  Result Date: 11/02/2019 CLINICAL DATA:  Redness, swelling of right knee EXAM: RIGHT KNEE - COMPLETE 4+ VIEW COMPARISON:  None. FINDINGS: No acute bony abnormality. Specifically, no fracture, subluxation, or dislocation. No joint effusion. IMPRESSION: No acute bony abnormality. Electronically Signed   By: Rolm Baptise M.D.   On: 11/02/2019 10:41    ____________________________________________  PROCEDURES   Procedure(s) performed (including Critical Care):  .Critical Care Performed by: Duffy Bruce, MD Authorized by: Duffy Bruce, MD   Critical care provider statement:    Critical care time (minutes):  35   Critical care time was exclusive of:  Separately billable procedures and treating other patients and teaching time   Critical care was necessary to treat or prevent imminent or life-threatening deterioration of the following conditions:  Cardiac failure, circulatory failure and sepsis   Critical care was time spent personally by me on the following activities:  Development of treatment plan with patient or surrogate, discussions with consultants, evaluation of patient's response to treatment, examination of patient, obtaining history from patient or surrogate, ordering and performing treatments and interventions, ordering and review of laboratory studies, ordering and review of  radiographic studies, pulse oximetry, re-evaluation of patient's condition and review of old charts   I assumed direction of critical care for this patient from another provider in my specialty: no      ____________________________________________  INITIAL IMPRESSION / MDM / Agenda / ED COURSE  As part of my medical decision making, I reviewed the following data within the [place] notes reviewed and incorporated, Old chart reviewed, Notes from prior ED visits, and [place] Controlled Substance Database       *Nicholas Abbott was evaluated in Emergency Department on 11/02/2019 for the symptoms described in the history of present illness. He was evaluated in the context of the global COVID-19 pandemic, which necessitated consideration that the patient might be at risk for infection with the SARS-CoV-2 virus that causes COVID-19. Institutional protocols and algorithms that pertain to the evaluation of patients at risk for COVID-19 are in a state of rapid change based on information released by regulatory bodies including the CDC and federal and state organizations. These policies and algorithms were followed during the patient's care in the ED.  Some ED evaluations and interventions may be delayed as a result of limited staffing during the pandemic.*     Medical Decision Making:  68 yo M here with RLE redness, warmth, and swelling with fevers to >104F at home. Suspect early sepsis 2/2 cellulitis of RLE. There is no appreciable joint effusion on XR or exam, and he is able to passively range it w/o marked pain - doubt septic arthritis at this time. Will admit for IV ABX, fluids. LA mildly elevated. Labs show leukocytosis c/w sepsis but no signs of end-organ dysfunction or severe sepsis. Will be cautious with fluids in setting of CHF, edema and cellulitis.   ____________________________________________  FINAL CLINICAL IMPRESSION(S) / ED DIAGNOSES  Final diagnoses:   Sepsis due to cellulitis ([place])     MEDICATIONS GIVEN DURING THIS VISIT:  Medications  0.9 %  sodium  chloride infusion ( Intravenous New Bag/Given 11/02/19 1540)  oxyCODONE-acetaminophen (PERCOCET/ROXICET) 5-325 MG per tablet 1 tablet (has no administration in time range)  ondansetron (ZOFRAN) injection 4 mg (has no administration in time range)  hydrALAZINE (APRESOLINE) tablet 25 mg (has no administration in time range)  acetaminophen (TYLENOL) tablet 650 mg (has no administration in time range)  dextrose 50 % solution 50 mL (has no administration in time range)  insulin aspart (novoLOG) injection 0-9 Units (has no administration in time range)  insulin aspart (novoLOG) injection 0-5 Units (has no administration in time range)  senna-docusate (Senokot-S) tablet 1 tablet (has no administration in time range)  vancomycin (VANCOREADY) IVPB 1500 mg/300 mL (has no administration in time range)  HYDROmorphone (DILAUDID) injection 1 mg (has no administration in time range)  sodium chloride 0.9 % bolus 1,000 mL (0 mLs Intravenous Stopped 11/02/19 1417)  sodium chloride 0.9 % bolus 1,000 mL (0 mLs Intravenous Stopped 11/02/19 1531)  ceFEPIme (MAXIPIME) 2 g in sodium chloride 0.9 % 100 mL IVPB (0 g Intravenous Stopped 11/02/19 1417)  vancomycin (VANCOCIN) IVPB 1000 mg/200 mL premix (0 mg Intravenous Stopped 11/02/19 1527)     ED Discharge Orders    None       Note:  This document was prepared using Dragon voice recognition software and may include unintentional dictation errors.   Duffy Bruce, MD 11/02/19 [phone]

## 2019-11-02 NOTE — ED Notes (Addendum)
Pt CBG 34,  Pt alert and oriented, states he "feels ok". 8 oz orange juice given. Will recheck CBG in 10 min

## 2019-11-02 NOTE — ED Notes (Signed)
Pt reports high fever for a couple days max 104, that broke last night. Reports calling EMS for hypoglycemia at 0400 this AM and receiving dextrose. Pt reporting pain to right knee that started on Tuesday, knee is noted to be swollen and red with heat.,  Pt in NAD at this time

## 2019-11-02 NOTE — ED Notes (Signed)
US at bedside

## 2019-11-02 NOTE — Plan of Care (Signed)
Patient is alert and oriented with complaints of redness and swelling to RLE. Patient's blood sugar was 61, gave glucose gel and rechecked in 15 minutes. Blood sugar increased to 75. Patient is on D5 NS @ 75.  Will reach out to NP to see about increasing the rate.  Nicholas Abbott

## 2019-11-02 NOTE — ED Triage Notes (Signed)
Pt sent from [facility], pt c/o having swelling of the right knee with redness since Tuesday, states he has been running a fever over the past couple of days, and the wife noticed he was confused this morning and called EMS, states his CBG was 51 and gave him some gel and it got up to 99, ate a PBJ sandwich.  Pt is a/ox4 on arrival.. states he takes metformin 500mg  bid, states his last dose was last night.

## 2019-11-02 NOTE — ED Notes (Signed)
Pt ambulated to restroom, NAD noted 

## 2019-11-02 NOTE — ED Notes (Signed)
CBG 125. Pt given dinner tray at this time

## 2019-11-02 NOTE — Progress Notes (Signed)
CODE SEPSIS - PHARMACY COMMUNICATION  **Broad Spectrum Antibiotics should be administered within 1 hour of Sepsis diagnosis**  Time Code Sepsis Called/Page Received: 12:33  Antibiotics Ordered: Cefepime and Vancomycin  Time of 1st antibiotic administration: Cefepime given at 12:56  Additional action taken by pharmacy: n/a  If necessary, Name of Provider/Nurse Contacted: n/a   Paulina Fusi, PharmD, BCPS 11/02/2019 1:34 PM

## 2019-11-02 NOTE — ED Notes (Signed)
Pt ambulatory to the bathroom. NAD at this time.

## 2019-11-02 NOTE — H&P (Addendum)
History and Physical    Nicholas Abbott DJS:[id number] DOB: 01-26-1951 DOA: 11/02/2019  Referring MD/NP/PA:   PCP: Valera Castle, MD   Patient coming from:  The patient is coming from home.  At baseline, pt is independent for most of ADL.        Chief Complaint: right lower leg pain and swelling  HPI: Nicholas Abbott is a 68 y.o. male with medical history significant of hypertension, hyperlipidemia, diabetes mellitus, GERD, CAD, who presents with right lower leg pain and swelling.  Patient states that he has chronic mild bilateral lower leg edema.  In the past 2 or 3 days, his right lower leg become more swollen, erythematous and painful. It is spreading from right ankle up to knee level. He had chills and fever of 104.4 at home.  Patient denies chest pain, shortness of breath, cough.  No nausea vomiting, diarrhea, abdominal, symptoms of UTI or unilateral weakness. Per report, his wife noticed he was confused this morning and called EMS, states his CBG was 51 and gave him some gel and it got up to 99, ate a PBJ sandwich. His blood sugar is 243 by BMP. When I saw pt in ED, he is alert, oriented x3.  His mental status is normal currently.  Denies unilateral weakness, numbness or tingling his extremities.  No facial droop or slurred speech.  ED Course: pt was found to have WBC 18.2, lactic acid 2.8, INR 1.3, thrombocytopenia with platelet 80, negative urinalysis, pending RVP for COVID-19, electrolytes renal function okay, temperature 99, blood pressure 153/91, heart rate is 74, oxygen saturation 97 to 100% on room air.  X-ray of right knee is negative.  Right lower extremity venous Dopplers negative for DVT.  Patient is placed on MedSurg bed for observation.  Review of Systems:   General: has fevers, chills, no body weight gain, has poor appetite, has fatigue HEENT: no blurry vision, hearing changes or sore throat Respiratory: no dyspnea, coughing, wheezing CV: no chest pain, no  palpitations GI: no nausea, vomiting, abdominal pain, diarrhea, constipation GU: no dysuria, burning on urination, increased urinary frequency, hematuria  Ext: has leg edema, right lower leg pain and erythema Neuro: no unilateral weakness, numbness, or tingling, no vision change or hearing loss Skin: no rash, no skin tear. MSK: No muscle spasm, no deformity, no limitation of range of movement in spin Heme: No easy bruising.  Travel history: No recent long distant travel.  Allergy:  Allergies  Allergen Reactions  . Pioglitazone Rash    Past Medical History:  Diagnosis Date  . Diabetes mellitus without complication ([place])   . GERD (gastroesophageal reflux disease)   . Hyperlipidemia   . Hypertension   . Left shoulder pain   . Myocardial infarction [facility])     Past Surgical History:  Procedure Laterality Date  . CATARACT EXTRACTION    . RETINAL DETACHMENT SURGERY      Social History:  reports that he has never smoked. He has never used smokeless tobacco. He reports current alcohol use of about 5.0 standard drinks of alcohol per week. No history on file for drug.  Family History:  Family History  Problem Relation Age of Onset  . Leukemia Father      Prior to Admission medications   Medication Sig Start Date End Date Taking? Authorizing Provider  aspirin EC 81 MG tablet Take 1 tablet by mouth daily.    [provider]  folic acid-pyridoxine-cyancobalamin (FOLBIC) 2.5-25-2 MG TABS tablet Take 1  tablet by mouth daily. 02/08/17   [provider]  gabapentin (NEURONTIN) 100 MG capsule Take 100 mg by mouth 2 (two) times daily.    [provider]  metFORMIN (GLUCOPHAGE-XR) 500 MG 24 hr tablet Take 4 tablets by mouth daily. At supper 01/14/17 01/14/18  [provider]  Nutritional Supplements (ANTIOXIDANT WITH CO Q-10 PO) Take 1 tablet by mouth daily.    [provider]  sildenafil (REVATIO) 20 MG tablet Take 1 tablet by mouth as needed.  11/16/16   [provider]  simvastatin (ZOCOR) 40 MG tablet Take 1 tablet by mouth daily. 01/30/16   [provider]    Physical Exam: Vitals:   11/02/19 1230 11/02/19 1300 11/02/19 1330 11/02/19 1400  BP: 139/77 (!) 145/87 (!) 172/86 (!) 153/91  Pulse: 74 74 75 74  Resp: _0 Temp:      TempSrc:      SpO2: 100% 99% 99% 100%  Weight:      Height:       General: Not in acute distress HEENT:       Eyes: PERRL, EOMI, no scleral icterus.       ENT: No discharge from the ears and nose, no pharynx injection, no tonsillar enlargement.        Neck: No JVD, no bruit, no mass felt. Heme: No neck lymph node enlargement. Cardiac: S1/S2, RRR, No murmurs, No gallops or rubs. Respiratory: No rales, wheezing, rhonchi or rubs. GI: Soft, nondistended, nontender, no rebound pain, no organomegaly, BS present. GU: No hematuria Ext: 1+DP/PT pulse bilaterally. Has erythema, warmth, tenderness and swelling in the right lower leg, from ankle to knee level. Musculoskeletal: No joint deformities, No joint redness or warmth, no limitation of ROM in spin. Skin: No rashes.  Neuro: Alert, oriented X3, cranial nerves II-XII grossly intact, moves all extremities normally.  Psych: Patient is not psychotic, no suicidal or hemocidal ideation.  Labs on Admission: I have personally reviewed following labs and imaging studies  CBC: Recent Labs  Lab 11/02/19 0941  WBC 18.2*  NEUTROABS 13.1*  HGB 14.8  HCT 43.5  MCV 103.6*  PLT 80*   Basic Metabolic Panel: Recent Labs  Lab 11/02/19 0941  NA 134*  K 4.1  CL 103  CO2 24  GLUCOSE 243*  BUN 24*  CREATININE 0.99  CALCIUM 8.6*   GFR: Estimated Creatinine Clearance: 99.7 mL/min (by C-G formula based on SCr of 0.99 mg/dL). Liver Function Tests: Recent Labs  Lab 11/02/19 0941  AST 98*  ALT 67*  ALKPHOS 45  BILITOT 1.7*  PROT 6.6  ALBUMIN 3.1*   No results for input(s): LIPASE, AMYLASE in the last 168 hours. No results  for input(s): AMMONIA in the last 168 hours. Coagulation Profile: Recent Labs  Lab 11/02/19 0941  INR 1.3*   Cardiac Enzymes: No results for input(s): CKTOTAL, CKMB, CKMBINDEX, TROPONINI in the last 168 hours. BNP (last 3 results) No results for input(s): PROBNP in the last 8760 hours. HbA1C: No results for input(s): HGBA1C in the last 72 hours. CBG: Recent Labs  Lab 11/02/19 1728  GLUCAP 34*   Lipid Profile: No results for input(s): CHOL, HDL, LDLCALC, TRIG, CHOLHDL, LDLDIRECT in the last 72 hours. Thyroid Function Tests: No results for input(s): TSH, T4TOTAL, FREET4, T3FREE, THYROIDAB in the last 72 hours. Anemia Panel: No results for input(s): VITAMINB12, FOLATE, FERRITIN, TIBC, IRON, RETICCTPCT in the last 72 hours. Urine analysis:    Component Value Date/Time  COLORURINE YELLOW (A) 11/02/2019 0956   APPEARANCEUR HAZY (A) 11/02/2019 0956   LABSPEC 1.012 11/02/2019 0956   PHURINE 6.0 11/02/2019 0956   GLUCOSEU 50 (A) 11/02/2019 0956   HGBUR LARGE (A) 11/02/2019 0956   BILIRUBINUR NEGATIVE 11/02/2019 0956   KETONESUR NEGATIVE 11/02/2019 0956   PROTEINUR NEGATIVE 11/02/2019 0956   NITRITE NEGATIVE 11/02/2019 0956   LEUKOCYTESUR NEGATIVE 11/02/2019 0956   Sepsis Labs: _0 (procalcitonin:4,lacticidven:4) ) Recent Results (from the past 240 hour(s))  Respiratory Panel by RT PCR (Flu A&B, Covid) - Nasopharyngeal Swab     Status: None   Collection Time: 11/02/19  1:42 PM   Specimen: Nasopharyngeal Swab  Result Value Ref Range Status   SARS Coronavirus 2 by RT PCR NEGATIVE NEGATIVE Final    Comment: (NOTE) SARS-CoV-2 target nucleic acids are NOT DETECTED. The SARS-CoV-2 RNA is generally detectable in upper respiratoy specimens during the acute phase of infection. The lowest concentration of SARS-CoV-2 viral copies this assay can detect is 131 copies/mL. A negative result does not preclude SARS-Cov-2 infection and should not be used as the sole basis for  treatment or other patient management decisions. A negative result may occur with  improper specimen collection/handling, submission of specimen other than nasopharyngeal swab, presence of viral mutation(s) within the areas targeted by this assay, and inadequate number of viral copies (<131 copies/mL). A negative result must be combined with clinical observations, patient history, and epidemiological information. The expected result is Negative. Fact Sheet for Patients:  PinkCheek.be Fact Sheet for Healthcare Providers:  GravelBags.it This test is not yet ap proved or cleared by the [place] FDA and  has been authorized for detection and/or diagnosis of SARS-CoV-2 by FDA under an Emergency Use Authorization (EUA). This EUA will remain  in effect (meaning this test can be used) for the duration of the COVID-19 declaration under Section 564(b)(1) of the Act, 21 U.S.C. section 360bbb-3(b)(1), unless the authorization is terminated or revoked sooner.    Influenza A by PCR NEGATIVE NEGATIVE Final   Influenza B by PCR NEGATIVE NEGATIVE Final    Comment: (NOTE) The Xpert Xpress SARS-CoV-2/FLU/RSV assay is intended as an aid in  the diagnosis of influenza from Nasopharyngeal swab specimens and  should not be used as a sole basis for treatment. Nasal washings and  aspirates are unacceptable for Xpert Xpress SARS-CoV-2/FLU/RSV  testing. Fact Sheet for Patients: PinkCheek.be Fact Sheet for Healthcare Providers: GravelBags.it This test is not yet approved or cleared by the [place] FDA and  has been authorized for detection and/or diagnosis of SARS-CoV-2 by  FDA under an Emergency Use Authorization (EUA). This EUA will remain  in effect (meaning this test can be used) for the duration of the  Covid-19 declaration under Section 564(b)(1) of the Act, 21  U.S.C. section  360bbb-3(b)(1), unless the authorization is  terminated or revoked. Performed at [facility], [address] [place], [place] [address]      Radiological Exams on Admission: US Venous Img Lower Unilateral Right  Result Date: 11/02/2019 CLINICAL DATA:  Right leg swelling and pain EXAM: RIGHT LOWER EXTREMITY VENOUS DOPPLER ULTRASOUND TECHNIQUE: Gray-scale sonography with graded compression, as well as color Doppler and duplex ultrasound were performed to evaluate the lower extremity deep venous systems from the level of the common femoral vein and including the common femoral, femoral, profunda femoral, popliteal and calf veins including the posterior tibial, peroneal and gastrocnemius veins when visible. The superficial great saphenous vein was also interrogated. Spectral Doppler was  utilized to evaluate flow at rest and with distal augmentation maneuvers in the common femoral, femoral and popliteal veins. COMPARISON:  None. FINDINGS: Contralateral Common Femoral Vein: Respiratory phasicity is normal and symmetric with the symptomatic side. No evidence of thrombus. Normal compressibility. Common Femoral Vein: No evidence of thrombus. Normal compressibility, respiratory phasicity and response to augmentation. Saphenofemoral Junction: No evidence of thrombus. Normal compressibility and flow on color Doppler imaging. Profunda Femoral Vein: No evidence of thrombus. Normal compressibility and flow on color Doppler imaging. Femoral Vein: No evidence of thrombus. Normal compressibility, respiratory phasicity and response to augmentation. Popliteal Vein: No evidence of thrombus. Normal compressibility, respiratory phasicity and response to augmentation. Calf Veins: No evidence of thrombus. Normal compressibility and flow on color Doppler imaging. IMPRESSION: No evidence of deep venous thrombosis. Electronically Signed   By: Jerilynn Mages.  Shick M.D.   On: 11/02/2019 14:56   DG Knee Complete 4 Views  Right  Result Date: 11/02/2019 CLINICAL DATA:  Redness, swelling of right knee EXAM: RIGHT KNEE - COMPLETE 4+ VIEW COMPARISON:  None. FINDINGS: No acute bony abnormality. Specifically, no fracture, subluxation, or dislocation. No joint effusion. IMPRESSION: No acute bony abnormality. Electronically Signed   By: Rolm Baptise M.D.   On: 11/02/2019 10:41     EKG: Independently reviewed.  Sinus rhythm, QTC 430, RAD, multiple PVC   Assessment/Plan Principal Problem:   Cellulitis of right lower extremity Active Problems:   Hyperlipidemia   Hypertension   Diabetes mellitus without complication (H[facility])   CAD (coronary artery disease)   Thrombocytopenia ([facility])   Acute metabolic encephalopathy   Hypoglycemia   Sepsis ([place])   Sepsis due to cellulitis of right lower extremity: Patient admits critical for sepsis with leukocytosis and fever.  Lactic acid is elevated 2.8.  Currently hemodynamically stable  - will place on med-surg bed for obs - Empiric antimicrobial treatment with vancomycin (pt also received 1 dose of cefepime by ED) - PRN Zofran for nausea, dilaudid and Percocet for pain - Blood cultures x 2  - ESR and CRP - IVF: 2.0L of NS bolus in ED, followed by 125 cc/h  Hyperlipidemia: -zocor  HTN:  -Continue home ramipril -hold lasix since pt needs IVF due to sepsis -hydralazine prn  Diabetes mellitus without complication ([place]): Last A1c 5.5 on 09/05/19, well controled. Patient is taking glipizide, Metformin at home. Now pt has hypoglycemia. -hold home meds  CAD (coronary artery disease): no CP -on ASA and zocor  Thrombocytopenia ([place]): -check LDH and peripheral smear  Acute metabolic encephalopathy: has resolved. -observe -f/u peripheral smear to r/o TTP  Hypoglycemia: resolved initially, but then developed hypoglycemia again with blood sugar 30-40s. -check CBG q1h -D50 prn -changed IVF from NS to D5-NS at 75cc/h     DVT ppx: SCD Code Status: Full code Family  Communication: None at bed side. Disposition Plan:  Anticipate discharge back to previous home environment Consults called:  None Admission status: Med-surg bed for obs    Date of Service 11/02/2019    [facility]   If 7PM-7AM, please contact night-coverage [email or web address] Password [facility] 11/02/2019, 5:34 PM

## 2019-11-02 NOTE — Progress Notes (Signed)
PHARMACY -  BRIEF ANTIBIOTIC NOTE   Pharmacy has received consult(s) for Cefepime and Vancomycin from an ED provider.  The patient's profile has been reviewed for ht/wt/allergies/indication/available labs.    One time order(s) placed for Cefepime and Vancomycin  Further antibiotics/pharmacy consults should be ordered by admitting physician if indicated.                       Thank you, Vira Blanco 11/02/2019  12:36 PM

## 2019-11-02 NOTE — ED Notes (Signed)
Prn tylenol given for 100.5 temp

## 2019-11-03 ENCOUNTER — Inpatient Hospital Stay: Payer: Medicare Other

## 2019-11-03 DIAGNOSIS — Z888 Allergy status to other drugs, medicaments and biological substances status: Secondary | ICD-10-CM | POA: Diagnosis not present

## 2019-11-03 DIAGNOSIS — B9620 Unspecified Escherichia coli [E. coli] as the cause of diseases classified elsewhere: Secondary | ICD-10-CM | POA: Diagnosis present

## 2019-11-03 DIAGNOSIS — E872 Acidosis: Secondary | ICD-10-CM | POA: Diagnosis present

## 2019-11-03 DIAGNOSIS — G9341 Metabolic encephalopathy: Secondary | ICD-10-CM | POA: Diagnosis present

## 2019-11-03 DIAGNOSIS — A419 Sepsis, unspecified organism: Secondary | ICD-10-CM | POA: Diagnosis present

## 2019-11-03 DIAGNOSIS — I2510 Atherosclerotic heart disease of native coronary artery without angina pectoris: Secondary | ICD-10-CM | POA: Diagnosis present

## 2019-11-03 DIAGNOSIS — R7401 Elevation of levels of liver transaminase levels: Secondary | ICD-10-CM | POA: Diagnosis present

## 2019-11-03 DIAGNOSIS — I10 Essential (primary) hypertension: Secondary | ICD-10-CM | POA: Diagnosis present

## 2019-11-03 DIAGNOSIS — Z6835 Body mass index (BMI) 35.0-35.9, adult: Secondary | ICD-10-CM | POA: Diagnosis not present

## 2019-11-03 DIAGNOSIS — D696 Thrombocytopenia, unspecified: Secondary | ICD-10-CM | POA: Diagnosis present

## 2019-11-03 DIAGNOSIS — Z7982 Long term (current) use of aspirin: Secondary | ICD-10-CM | POA: Diagnosis not present

## 2019-11-03 DIAGNOSIS — E669 Obesity, unspecified: Secondary | ICD-10-CM | POA: Diagnosis present

## 2019-11-03 DIAGNOSIS — K219 Gastro-esophageal reflux disease without esophagitis: Secondary | ICD-10-CM | POA: Diagnosis present

## 2019-11-03 DIAGNOSIS — Z79890 Hormone replacement therapy (postmenopausal): Secondary | ICD-10-CM | POA: Diagnosis not present

## 2019-11-03 DIAGNOSIS — I252 Old myocardial infarction: Secondary | ICD-10-CM | POA: Diagnosis not present

## 2019-11-03 DIAGNOSIS — E11649 Type 2 diabetes mellitus with hypoglycemia without coma: Secondary | ICD-10-CM | POA: Diagnosis not present

## 2019-11-03 DIAGNOSIS — L03115 Cellulitis of right lower limb: Secondary | ICD-10-CM | POA: Diagnosis not present

## 2019-11-03 DIAGNOSIS — Z7984 Long term (current) use of oral hypoglycemic drugs: Secondary | ICD-10-CM | POA: Diagnosis not present

## 2019-11-03 DIAGNOSIS — Z7289 Other problems related to lifestyle: Secondary | ICD-10-CM | POA: Diagnosis not present

## 2019-11-03 DIAGNOSIS — Z20822 Contact with and (suspected) exposure to covid-19: Secondary | ICD-10-CM | POA: Diagnosis present

## 2019-11-03 DIAGNOSIS — E876 Hypokalemia: Secondary | ICD-10-CM | POA: Diagnosis present

## 2019-11-03 DIAGNOSIS — L0390 Cellulitis, unspecified: Secondary | ICD-10-CM | POA: Diagnosis present

## 2019-11-03 DIAGNOSIS — N390 Urinary tract infection, site not specified: Secondary | ICD-10-CM | POA: Diagnosis present

## 2019-11-03 DIAGNOSIS — Z79899 Other long term (current) drug therapy: Secondary | ICD-10-CM | POA: Diagnosis not present

## 2019-11-03 DIAGNOSIS — E785 Hyperlipidemia, unspecified: Secondary | ICD-10-CM | POA: Diagnosis present

## 2019-11-03 LAB — GLUCOSE, CAPILLARY
Glucose-Capillary: 108 mg/dL — ABNORMAL HIGH (ref 70–99)
Glucose-Capillary: 125 mg/dL — ABNORMAL HIGH (ref 70–99)
Glucose-Capillary: 175 mg/dL — ABNORMAL HIGH (ref 70–99)
Glucose-Capillary: 72 mg/dL (ref 70–99)
Glucose-Capillary: 85 mg/dL (ref 70–99)
Glucose-Capillary: 93 mg/dL (ref 70–99)
Glucose-Capillary: 95 mg/dL (ref 70–99)

## 2019-11-03 LAB — BASIC METABOLIC PANEL
Anion gap: 4 — ABNORMAL LOW (ref 5–15)
BUN: 16 mg/dL (ref 8–23)
CO2: 21 mmol/L — ABNORMAL LOW (ref 22–32)
Calcium: 8.3 mg/dL — ABNORMAL LOW (ref 8.9–10.3)
Chloride: 113 mmol/L — ABNORMAL HIGH (ref 98–111)
Creatinine, Ser: 0.95 mg/dL (ref 0.61–1.24)
GFR calc Af Amer: >60 mL/min (ref >60)
GFR calc non Af Amer: >60 mL/min (ref >60)
Glucose, Bld: 121 mg/dL — ABNORMAL HIGH (ref 70–99)
Potassium: 3.9 mmol/L (ref 3.5–5.1)
Sodium: 138 mmol/L (ref 135–145)

## 2019-11-03 LAB — CBC
HCT: 36.9 % — ABNORMAL LOW (ref 39.0–52.0)
Hemoglobin: 13.5 g/dL (ref 13.0–17.0)
MCH: 36.1 pg — ABNORMAL HIGH (ref 26.0–34.0)
MCHC: 36.6 g/dL — ABNORMAL HIGH (ref 30.0–36.0)
MCV: 98.7 fL (ref 80.0–100.0)
Platelets: 84 10*3/uL — ABNORMAL LOW (ref 150–400)
RBC: 3.74 MIL/uL — ABNORMAL LOW (ref 4.22–5.81)
RDW: 14.1 % (ref 11.5–15.5)
WBC: 14.4 10*3/uL — ABNORMAL HIGH (ref 4.0–10.5)
nRBC: 0 % (ref 0.0–0.2)

## 2019-11-03 LAB — LACTIC ACID, PLASMA: Lactic Acid, Venous: 1.2 mmol/L (ref 0.5–1.9)

## 2019-11-03 MED ORDER — TRAMADOL HCL 50 MG PO TABS
100.0000 mg | ORAL_TABLET | Freq: Four times a day (QID) | ORAL | Status: DC | PRN
  Administered 2019-11-03 – 2019-11-05 (×4): 100 mg via ORAL
  Filled 2019-11-03 (×4): qty 2

## 2019-11-03 MED ORDER — INSULIN ASPART 100 UNIT/ML ~~LOC~~ SOLN
0.0000 [IU] | Freq: Three times a day (TID) | SUBCUTANEOUS | Status: DC
  Administered 2019-11-04: 2 [IU] via SUBCUTANEOUS
  Filled 2019-11-03: qty 1

## 2019-11-03 MED ORDER — FUROSEMIDE 40 MG PO TABS
40.0000 mg | ORAL_TABLET | Freq: Every day | ORAL | Status: DC
  Administered 2019-11-03: 40 mg via ORAL
  Filled 2019-11-03 (×2): qty 1

## 2019-11-03 MED ORDER — TRAMADOL HCL 50 MG PO TABS
50.0000 mg | ORAL_TABLET | Freq: Four times a day (QID) | ORAL | Status: DC | PRN
  Administered 2019-11-03: 50 mg via ORAL
  Filled 2019-11-03: qty 1

## 2019-11-03 MED ORDER — DEXTROSE 10 % IV SOLN: INTRAVENOUS | Status: DC

## 2019-11-03 MED ORDER — SODIUM CHLORIDE 0.9 % IV SOLN: INTRAVENOUS | Status: DC

## 2019-11-03 NOTE — Progress Notes (Signed)
PROGRESS NOTE    Nicholas Abbott  [id number] DOB: 28-Sep-1951 DOA: 11/02/2019 PCP: Valera Castle, MD      Assessment & Plan:   Principal Problem:   Cellulitis of right lower extremity Active Problems:   Hyperlipidemia   Hypertension   Diabetes mellitus without complication ([place])   CAD (coronary artery disease)   Thrombocytopenia ([place])   Acute metabolic encephalopathy   Hypoglycemia   Sepsis ([place])   Sepsis due to cellulitis of right lower extremity: Continue on IV vanco. Zofran prn for nausea. Tramadol/dilaudid prn for pain. Continue on IVFs. Blood cx pending  RLE cellulitis: still significantly swollen, decreased erythema. Continue on IV vanco. [place] of RLE neg for DVT. XR right ankle ordered.   Lactic acidosis: resolved  Leukocytosis: secondary to infection. Continue on IV abxs  Hyperlipidemia: continue on statin   HTN: continue home dose of ramipril. Will restart home dose of lasix   DM2: without complication. Last A1c 5.5 on 09/05/19, well controled. Will hold home dose of oral hypoglycemic meds. Will start SSI w/ accuchecks   CAD: no CP. Continue on aspirin and statin   Thrombocytopenia: etiology unclear. Trending up slightly today. Unlikely TTP as platelets often <20K w/ MAHA and pt is not anemic. Will continue to monitor   Acute metabolic encephalopathy: resolved.   Hypoglycemia: resolved    DVT prophylaxis: SCDs Code Status:  Full  Family Communication: Disposition Plan:    Consultants:   none   Procedures:    Antimicrobials: vanco   Subjective: Pt c/o right leg pain and swelling   Objective: Vitals:   11/02/19 2000 11/02/19 2055 11/02/19 2123 11/03/19 0410  BP: (!) 177/104  (!) 159/81 132/69  Pulse: 77  70 71  Resp: 16  (!) 24 (!) 24  Temp:  (!) 100.5 F (38.1 C) 98.5 F (36.9 C) 98.8 F (37.1 C)  TempSrc:  Oral Oral Oral  SpO2: 98%  97% 95%  Weight:   126.3 kg   Height:   6\' 2"  (1.88 m)     Intake/Output  Summary (Last 24 hours) at 11/03/2019 0806 Last data filed at 11/03/2019 0714 Gross per 24 hour  Intake 3443.19 ml  Output 1450 ml  Net 1993.19 ml   Filed Weights   11/02/19 0930 11/02/19 2123  Weight: 127 kg 126.3 kg    Examination:  General exam: Appears calm and comfortable. Obese. Respiratory system: diminished breath sounds b/l otherwise clear Cardiovascular system: S1 & S2 +. No , rubs, gallops or clicks. B/L LE edema R>L Gastrointestinal system: Abdomen is obese, soft and nontender.Normal bowel sounds heard. Central nervous system: Alert and oriented. Moves all 4 extremities Psychiatry: Judgement and insight appear normal. Flat mood and affect     Data Reviewed: I have personally reviewed following labs and imaging studies  CBC: Recent Labs  Lab 11/02/19 0941 11/03/19 0623  WBC 18.2* 14.4*  NEUTROABS 13.1*  --   HGB 14.8 13.5  HCT 43.5 36.9*  MCV 103.6* 98.7  PLT 80* 84*   Basic Metabolic Panel: Recent Labs  Lab 11/02/19 0941 11/03/19 0623  NA 134* 138  K 4.1 3.9  CL 103 113*  CO2 24 21*  GLUCOSE 243* 121*  BUN 24* 16  CREATININE 0.99 0.95  CALCIUM 8.6* 8.3*   GFR: Estimated Creatinine Clearance: 103.6 mL/min (by C-G formula based on SCr of 0.95 mg/dL). Liver Function Tests: Recent Labs  Lab 11/02/19 0941  AST 98*  ALT 67*  ALKPHOS 45  BILITOT 1.7*  PROT 6.6  ALBUMIN 3.1*   No results for input(s): LIPASE, AMYLASE in the last 168 hours. No results for input(s): AMMONIA in the last 168 hours. Coagulation Profile: Recent Labs  Lab 11/02/19 0941  INR 1.3*   Cardiac Enzymes: No results for input(s): CKTOTAL, CKMB, CKMBINDEX, TROPONINI in the last 168 hours. BNP (last 3 results) No results for input(s): PROBNP in the last 8760 hours. HbA1C: No results for input(s): HGBA1C in the last 72 hours. CBG: Recent Labs  Lab 11/02/19 2205 11/02/19 2248 11/03/19 0011 11/03/19 0406 11/03/19 0742  GLUCAP 61* 75 85 72 108*   Lipid Profile: No  results for input(s): CHOL, HDL, LDLCALC, TRIG, CHOLHDL, LDLDIRECT in the last 72 hours. Thyroid Function Tests: No results for input(s): TSH, T4TOTAL, FREET4, T3FREE, THYROIDAB in the last 72 hours. Anemia Panel: No results for input(s): VITAMINB12, FOLATE, FERRITIN, TIBC, IRON, RETICCTPCT in the last 72 hours. Sepsis Labs: Recent Labs  Lab 11/02/19 0956 11/03/19 0624  LATICACIDVEN 2.8* 1.2    Recent Results (from the past 240 hour(s))  Respiratory Panel by RT PCR (Flu A&B, Covid) - Nasopharyngeal Swab     Status: None   Collection Time: 11/02/19  1:42 PM   Specimen: Nasopharyngeal Swab  Result Value Ref Range Status   SARS Coronavirus 2 by RT PCR NEGATIVE NEGATIVE Final    Comment: (NOTE) SARS-CoV-2 target nucleic acids are NOT DETECTED. The SARS-CoV-2 RNA is generally detectable in upper respiratoy specimens during the acute phase of infection. The lowest concentration of SARS-CoV-2 viral copies this assay can detect is 131 copies/mL. A negative result does not preclude SARS-Cov-2 infection and should not be used as the sole basis for treatment or other patient management decisions. A negative result may occur with  improper specimen collection/handling, submission of specimen other than nasopharyngeal swab, presence of viral mutation(s) within the areas targeted by this assay, and inadequate number of viral copies (<131 copies/mL). A negative result must be combined with clinical observations, patient history, and epidemiological information. The expected result is Negative. Fact Sheet for Patients:  PinkCheek.be Fact Sheet for Healthcare Providers:  GravelBags.it This test is not yet ap proved or cleared by the [place] FDA and  has been authorized for detection and/or diagnosis of SARS-CoV-2 by FDA under an Emergency Use Authorization (EUA). This EUA will remain  in effect (meaning this test can be used) for  the duration of the COVID-19 declaration under Section 564(b)(1) of the Act, 21 U.S.C. section 360bbb-3(b)(1), unless the authorization is terminated or revoked sooner.    Influenza A by PCR NEGATIVE NEGATIVE Final   Influenza B by PCR NEGATIVE NEGATIVE Final    Comment: (NOTE) The Xpert Xpress SARS-CoV-2/FLU/RSV assay is intended as an aid in  the diagnosis of influenza from Nasopharyngeal swab specimens and  should not be used as a sole basis for treatment. Nasal washings and  aspirates are unacceptable for Xpert Xpress SARS-CoV-2/FLU/RSV  testing. Fact Sheet for Patients: PinkCheek.be Fact Sheet for Healthcare Providers: GravelBags.it This test is not yet approved or cleared by the [place] FDA and  has been authorized for detection and/or diagnosis of SARS-CoV-2 by  FDA under an Emergency Use Authorization (EUA). This EUA will remain  in effect (meaning this test can be used) for the duration of the  Covid-19 declaration under Section 564(b)(1) of the Act, 21  U.S.C. section 360bbb-3(b)(1), unless the authorization is  terminated or revoked. Performed at [facility], [address] [place], [place] [address]  Radiology Studies: US Venous Img Lower Unilateral Right  Result Date: 11/02/2019 CLINICAL DATA:  Right leg swelling and pain EXAM: RIGHT LOWER EXTREMITY VENOUS DOPPLER ULTRASOUND TECHNIQUE: Gray-scale sonography with graded compression, as well as color Doppler and duplex ultrasound were performed to evaluate the lower extremity deep venous systems from the level of the common femoral vein and including the common femoral, femoral, profunda femoral, popliteal and calf veins including the posterior tibial, peroneal and gastrocnemius veins when visible. The superficial great saphenous vein was also interrogated. Spectral Doppler was utilized to evaluate flow at rest and with distal  augmentation maneuvers in the common femoral, femoral and popliteal veins. COMPARISON:  None. FINDINGS: Contralateral Common Femoral Vein: Respiratory phasicity is normal and symmetric with the symptomatic side. No evidence of thrombus. Normal compressibility. Common Femoral Vein: No evidence of thrombus. Normal compressibility, respiratory phasicity and response to augmentation. Saphenofemoral Junction: No evidence of thrombus. Normal compressibility and flow on color Doppler imaging. Profunda Femoral Vein: No evidence of thrombus. Normal compressibility and flow on color Doppler imaging. Femoral Vein: No evidence of thrombus. Normal compressibility, respiratory phasicity and response to augmentation. Popliteal Vein: No evidence of thrombus. Normal compressibility, respiratory phasicity and response to augmentation. Calf Veins: No evidence of thrombus. Normal compressibility and flow on color Doppler imaging. IMPRESSION: No evidence of deep venous thrombosis. Electronically Signed   By: Jerilynn Mages.  Shick M.D.   On: 11/02/2019 14:56   DG Knee Complete 4 Views Right  Result Date: 11/02/2019 CLINICAL DATA:  Redness, swelling of right knee EXAM: RIGHT KNEE - COMPLETE 4+ VIEW COMPARISON:  None. FINDINGS: No acute bony abnormality. Specifically, no fracture, subluxation, or dislocation. No joint effusion. IMPRESSION: No acute bony abnormality. Electronically Signed   By: Rolm Baptise M.D.   On: 11/02/2019 10:41        Scheduled Meds: . folic acid  2.5 mg Oral Daily   And  . vitamin B-6  25 mg Oral Daily   And  . vitamin B-12  2,000 mcg Oral Daily  . levothyroxine  50 mcg Oral q morning - 10a  . ramipril  2.5 mg Oral Daily  . simvastatin  40 mg Oral q1800  . vitamin B-12  1,000 mcg Oral Daily   Continuous Infusions: . sodium chloride 75 mL/hr at 11/03/19 0445  . dextrose 50 mL/hr at 11/03/19 0445  . vancomycin Stopped (11/03/19 0032)     LOS: 0 days    Time spent: 33 mins     Wyvonnia Dusky, MD [facility] Pager [phone]  If 7PM-7AM, please contact night-coverage [email or web address] Password [email or web address] 11/03/2019, 8:06 AM

## 2019-11-03 NOTE — Progress Notes (Signed)
Rechecked blood sugar @ 0000 and it increased to 85. Instructed by NP Randol Kern to keep D5NS at current rate of 75 mL/hr and orders were placed for q4 CBG instead of q1.  Will recheck blood sugar at 0400.  Continue to monitor.  Phoebe Sharps N  11/03/2019  3:15 AM

## 2019-11-03 NOTE — Progress Notes (Signed)
Patient's blood sugar was 72 @ 0400 so messaged NP Randol Kern and she ordered D10 @50  and NS @ 75. I also gave patient some cranberry juice. Will continue to monitor.  Christene Slates  11/03/2019  4:51 AM

## 2019-11-04 LAB — GLUCOSE, CAPILLARY
Glucose-Capillary: 103 mg/dL — ABNORMAL HIGH (ref 70–99)
Glucose-Capillary: 122 mg/dL — ABNORMAL HIGH (ref 70–99)
Glucose-Capillary: 125 mg/dL — ABNORMAL HIGH (ref 70–99)
Glucose-Capillary: 151 mg/dL — ABNORMAL HIGH (ref 70–99)
Glucose-Capillary: 81 mg/dL (ref 70–99)
Glucose-Capillary: 86 mg/dL (ref 70–99)

## 2019-11-04 LAB — BASIC METABOLIC PANEL
Anion gap: 7 (ref 5–15)
BUN: 16 mg/dL (ref 8–23)
CO2: 22 mmol/L (ref 22–32)
Calcium: 7.8 mg/dL — ABNORMAL LOW (ref 8.9–10.3)
Chloride: 110 mmol/L (ref 98–111)
Creatinine, Ser: 0.94 mg/dL (ref 0.61–1.24)
GFR calc Af Amer: >60 mL/min (ref >60)
GFR calc non Af Amer: >60 mL/min (ref >60)
Glucose, Bld: 103 mg/dL — ABNORMAL HIGH (ref 70–99)
Potassium: 3.6 mmol/L (ref 3.5–5.1)
Sodium: 139 mmol/L (ref 135–145)

## 2019-11-04 LAB — URINE CULTURE: Culture: 10000 — AB

## 2019-11-04 LAB — CBC
HCT: 36.3 % — ABNORMAL LOW (ref 39.0–52.0)
Hemoglobin: 13.1 g/dL (ref 13.0–17.0)
MCH: 36.1 pg — ABNORMAL HIGH (ref 26.0–34.0)
MCHC: 36.1 g/dL — ABNORMAL HIGH (ref 30.0–36.0)
MCV: 100 fL (ref 80.0–100.0)
Platelets: 92 10*3/uL — ABNORMAL LOW (ref 150–400)
RBC: 3.63 MIL/uL — ABNORMAL LOW (ref 4.22–5.81)
RDW: 14.3 % (ref 11.5–15.5)
WBC: 11.7 10*3/uL — ABNORMAL HIGH (ref 4.0–10.5)
nRBC: 0 % (ref 0.0–0.2)

## 2019-11-04 MED ORDER — SODIUM CHLORIDE 0.9 % IV SOLN
INTRAVENOUS | Status: DC | PRN
  Administered 2019-11-04: 250 mL via INTRAVENOUS

## 2019-11-04 MED ORDER — FUROSEMIDE 10 MG/ML IJ SOLN
40.0000 mg | Freq: Every day | INTRAMUSCULAR | Status: DC
  Administered 2019-11-04 – 2019-11-05 (×2): 40 mg via INTRAVENOUS
  Filled 2019-11-04 (×2): qty 4

## 2019-11-04 MED ORDER — CEFAZOLIN SODIUM-DEXTROSE 1-4 GM/50ML-% IV SOLN
1.0000 g | Freq: Three times a day (TID) | INTRAVENOUS | Status: DC
  Administered 2019-11-04 – 2019-11-05 (×3): 1 g via INTRAVENOUS
  Filled 2019-11-04 (×5): qty 50

## 2019-11-04 NOTE — Progress Notes (Signed)
PROGRESS NOTE    Nicholas Abbott  [id number] DOB: September 09, 1951 DOA: 11/02/2019 PCP: Valera Castle, MD      Assessment & Plan:   Principal Problem:   Cellulitis of right lower extremity Active Problems:   Hyperlipidemia   Hypertension   Diabetes mellitus without complication ([place])   CAD (coronary artery disease)   Thrombocytopenia ([place])   Acute metabolic encephalopathy   Hypoglycemia   Sepsis ([place])   Cellulitis   Sepsis due to cellulitis of right lower extremity & UTI: resolved. Change abx to IV cefazolin to cover both cellulitis & e.coli UTI. Zofran prn for nausea. Tramadol/dilaudid prn for pain. Continue on IVFs. Blood cx NGTD  RLE cellulitis: still significantly swollen, decreased erythema. Changed abx to IV cefazolin. [place] of RLE neg for DVT. XR right ankle shows diffuse soft tissue swelling and edema, nonspecific. No acute osseous abnormality of the right ankle. Continue on lasix but IV today for significant LE edema   UTI: urine cx is growing e. coli. Changed abx to IV cefazolin as per sens   Lactic acidosis: resolved  Leukocytosis: secondary to infection. Continue on IV abxs  Hyperlipidemia: continue on statin   HTN: continue home dose of ramipril. Will continue on home dose of lasix   DM2: without complication. Last A1c 5.5 on 09/05/19, well controled. Will hold home dose of oral hypoglycemic meds. Will continue on SSI w/ accuchecks   CAD: no CP. Continue on aspirin and statin   Thrombocytopenia: etiology unclear. Trending up again today. Unlikely TTP as platelets often <20K w/ MAHA and pt is not anemic. Will continue to monitor   Transaminitis: etiology unclear. Will continue to monitor   Acute metabolic encephalopathy: resolved.   Hypoglycemia: resolved    DVT prophylaxis: SCDs Code Status:  Full  Family Communication: Disposition Plan:    Consultants:   none   Procedures:    Antimicrobials: cefazolin   Subjective: Pt c/o  right leg pain which slightly improved from day prior.   Objective: Vitals:   11/03/19 0410 11/03/19 1207 11/03/19 2009 11/04/19 0420  BP: 132/69 (!) 158/94 (!) 147/92 136/85  Pulse: 71 66 64 (!) 56  Resp: (!) 24 20 20 20   Temp: 98.8 F (37.1 C) 98.9 F (37.2 C) 98.9 F (37.2 C) 98.2 F (36.8 C)  TempSrc: Oral Oral Oral Oral  SpO2: 95% 97% 96% 98%  Weight:      Height:        Intake/Output Summary (Last 24 hours) at 11/04/2019 0752 Last data filed at 11/03/2019 2003 Gross per 24 hour  Intake 1379.98 ml  Output 1350 ml  Net 29.98 ml   Filed Weights   11/02/19 0930 11/02/19 2123  Weight: 127 kg 126.3 kg    Examination:  General exam: Appears calm and comfortable. Obese. Respiratory system: decreased breath sounds b/l otherwise clear Cardiovascular system: S1 & S2 +. No , rubs, gallops or clicks. B/L +2 pitting LE edema R>L Gastrointestinal system: Abdomen is obese, soft and nontender.Normal bowel sounds heard. Central nervous system: Alert and oriented. Moves all 4 extremities Psychiatry: Judgement and insight appear normal. Flat mood and affect     Data Reviewed: I have personally reviewed following labs and imaging studies  CBC: Recent Labs  Lab 11/02/19 0941 11/03/19 0623 11/04/19 0320  WBC 18.2* 14.4* 11.7*  NEUTROABS 13.1*  --   --   HGB 14.8 13.5 13.1  HCT 43.5 36.9* 36.3*  MCV 103.6* 98.7 100.0  PLT 80* 84* 92*  Basic Metabolic Panel: Recent Labs  Lab 11/02/19 0941 11/03/19 0623 11/04/19 0320  NA 134* 138 139  K 4.1 3.9 3.6  CL 103 113* 110  CO2 24 21* 22  GLUCOSE 243* 121* 103*  BUN 24* 16 16  CREATININE 0.99 0.95 0.94  CALCIUM 8.6* 8.3* 7.8*   GFR: Estimated Creatinine Clearance: 104.7 mL/min (by C-G formula based on SCr of 0.94 mg/dL). Liver Function Tests: Recent Labs  Lab 11/02/19 0941  AST 98*  ALT 67*  ALKPHOS 45  BILITOT 1.7*  PROT 6.6  ALBUMIN 3.1*   No results for input(s): LIPASE, AMYLASE in the last 168 hours. No  results for input(s): AMMONIA in the last 168 hours. Coagulation Profile: Recent Labs  Lab 11/02/19 0941  INR 1.3*   Cardiac Enzymes: No results for input(s): CKTOTAL, CKMB, CKMBINDEX, TROPONINI in the last 168 hours. BNP (last 3 results) No results for input(s): PROBNP in the last 8760 hours. HbA1C: No results for input(s): HGBA1C in the last 72 hours. CBG: Recent Labs  Lab 11/03/19 1722 11/03/19 1957 11/03/19 2216 11/04/19 0007 11/04/19 0420  GLUCAP 125* 95 93 81 103*   Lipid Profile: No results for input(s): CHOL, HDL, LDLCALC, TRIG, CHOLHDL, LDLDIRECT in the last 72 hours. Thyroid Function Tests: No results for input(s): TSH, T4TOTAL, FREET4, T3FREE, THYROIDAB in the last 72 hours. Anemia Panel: No results for input(s): VITAMINB12, FOLATE, FERRITIN, TIBC, IRON, RETICCTPCT in the last 72 hours. Sepsis Labs: Recent Labs  Lab 11/02/19 0956 11/03/19 0624  LATICACIDVEN 2.8* 1.2    Recent Results (from the past 240 hour(s))  Culture, blood (Routine x 2)     Status: None (Preliminary result)   Collection Time: 11/02/19  9:41 AM   Specimen: BLOOD  Result Value Ref Range Status   Specimen Description BLOOD LEFT ANTECUBITAL  Final   Special Requests   Final    BOTTLES DRAWN AEROBIC AND ANAEROBIC Blood Culture adequate volume   Culture   Final    NO GROWTH 2 DAYS Performed at [facility], [address], [place], [place] [address]    Report Status PENDING  Incomplete  Urine culture     Status: Abnormal (Preliminary result)   Collection Time: 11/02/19 12:35 PM   Specimen: In/Out Cath Urine  Result Value Ref Range Status   Specimen Description   Final    IN/OUT CATH URINE Performed at [facility], [address] [place], [place] [address]    Special Requests   Final    NONE Performed at [facility], [address] [place], [place] [address]    Culture (A)  Final    7,000 COLONIES/mL GRAM NEGATIVE RODS IDENTIFICATION AND  SUSCEPTIBILITIES TO FOLLOW Performed at [facility], [place] [address], [place], [place] [address]    Report Status PENDING  Incomplete  Respiratory Panel by RT PCR (Flu A&B, Covid) - Nasopharyngeal Swab     Status: None   Collection Time: 11/02/19  1:42 PM   Specimen: Nasopharyngeal Swab  Result Value Ref Range Status   SARS Coronavirus 2 by RT PCR NEGATIVE NEGATIVE Final    Comment: (NOTE) SARS-CoV-2 target nucleic acids are NOT DETECTED. The SARS-CoV-2 RNA is generally detectable in upper respiratoy specimens during the acute phase of infection. The lowest concentration of SARS-CoV-2 viral copies this assay can detect is 131 copies/mL. A negative result does not preclude SARS-Cov-2 infection and should not be used as the sole basis for treatment or other patient management  decisions. A negative result may occur with  improper specimen collection/handling, submission of specimen other than nasopharyngeal swab, presence of viral mutation(s) within the areas targeted by this assay, and inadequate number of viral copies (<131 copies/mL). A negative result must be combined with clinical observations, patient history, and epidemiological information. The expected result is Negative. Fact Sheet for Patients:  PinkCheek.be Fact Sheet for Healthcare Providers:  GravelBags.it This test is not yet ap proved or cleared by the [place] FDA and  has been authorized for detection and/or diagnosis of SARS-CoV-2 by FDA under an Emergency Use Authorization (EUA). This EUA will remain  in effect (meaning this test can be used) for the duration of the COVID-19 declaration under Section 564(b)(1) of the Act, 21 U.S.C. section 360bbb-3(b)(1), unless the authorization is terminated or revoked sooner.    Influenza A by PCR NEGATIVE NEGATIVE Final   Influenza B by PCR NEGATIVE NEGATIVE Final    Comment: (NOTE) The Xpert Xpress  SARS-CoV-2/FLU/RSV assay is intended as an aid in  the diagnosis of influenza from Nasopharyngeal swab specimens and  should not be used as a sole basis for treatment. Nasal washings and  aspirates are unacceptable for Xpert Xpress SARS-CoV-2/FLU/RSV  testing. Fact Sheet for Patients: PinkCheek.be Fact Sheet for Healthcare Providers: GravelBags.it This test is not yet approved or cleared by the [place] FDA and  has been authorized for detection and/or diagnosis of SARS-CoV-2 by  FDA under an Emergency Use Authorization (EUA). This EUA will remain  in effect (meaning this test can be used) for the duration of the  Covid-19 declaration under Section 564(b)(1) of the Act, 21  U.S.C. section 360bbb-3(b)(1), unless the authorization is  terminated or revoked. Performed at [facility], [address] [place], [place] [address]   Culture, blood (Routine x 2)     Status: None (Preliminary result)   Collection Time: 11/03/19  6:23 AM   Specimen: BLOOD  Result Value Ref Range Status   Specimen Description BLOOD LEFT ANTECUBITAL  Final   Special Requests   Final    BOTTLES DRAWN AEROBIC AND ANAEROBIC Blood Culture adequate volume   Culture   Final    NO GROWTH 1 DAY Performed at [facility], [address] [place], [place] [address]    Report Status PENDING  Incomplete         Radiology Studies: DG Ankle 2 Views Right  Result Date: 11/03/2019 CLINICAL DATA:  Medial right ankle pain and swelling EXAM: RIGHT ANKLE - 2 VIEW COMPARISON:  None. FINDINGS: No acute fracture or malalignment. Ankle mortise is congruent. Tibiotalar joint space is maintained. Small plantar calcaneal spur. Diffuse soft tissue edema, most pronounced over the medial aspect of the ankle. No soft tissue gas. Scattered vascular calcifications. IMPRESSION: 1. Diffuse soft tissue swelling and edema, nonspecific. 2. No acute osseous  abnormality of the right ankle. Electronically Signed   By: Davina Poke D.O.   On: 11/03/2019 14:14   US Venous Img Lower Unilateral Right  Result Date: 11/02/2019 CLINICAL DATA:  Right leg swelling and pain EXAM: RIGHT LOWER EXTREMITY VENOUS DOPPLER ULTRASOUND TECHNIQUE: Gray-scale sonography with graded compression, as well as color Doppler and duplex ultrasound were performed to evaluate the lower extremity deep venous systems from the level of the common femoral vein and including the common femoral, femoral, profunda femoral, popliteal and calf veins including the posterior tibial, peroneal and gastrocnemius veins when visible. The superficial great saphenous vein was also interrogated. Spectral Doppler  was utilized to evaluate flow at rest and with distal augmentation maneuvers in the common femoral, femoral and popliteal veins. COMPARISON:  None. FINDINGS: Contralateral Common Femoral Vein: Respiratory phasicity is normal and symmetric with the symptomatic side. No evidence of thrombus. Normal compressibility. Common Femoral Vein: No evidence of thrombus. Normal compressibility, respiratory phasicity and response to augmentation. Saphenofemoral Junction: No evidence of thrombus. Normal compressibility and flow on color Doppler imaging. Profunda Femoral Vein: No evidence of thrombus. Normal compressibility and flow on color Doppler imaging. Femoral Vein: No evidence of thrombus. Normal compressibility, respiratory phasicity and response to augmentation. Popliteal Vein: No evidence of thrombus. Normal compressibility, respiratory phasicity and response to augmentation. Calf Veins: No evidence of thrombus. Normal compressibility and flow on color Doppler imaging. IMPRESSION: No evidence of deep venous thrombosis. Electronically Signed   By: Jerilynn Mages.  Shick M.D.   On: 11/02/2019 14:56   DG Knee Complete 4 Views Right  Result Date: 11/02/2019 CLINICAL DATA:  Redness, swelling of right knee EXAM: RIGHT  KNEE - COMPLETE 4+ VIEW COMPARISON:  None. FINDINGS: No acute bony abnormality. Specifically, no fracture, subluxation, or dislocation. No joint effusion. IMPRESSION: No acute bony abnormality. Electronically Signed   By: Rolm Baptise M.D.   On: 11/02/2019 10:41        Scheduled Meds: . folic acid  2.5 mg Oral Daily   And  . vitamin B-6  25 mg Oral Daily   And  . vitamin B-12  2,000 mcg Oral Daily  . furosemide  40 mg Oral Daily  . insulin aspart  0-9 Units Subcutaneous TID WC  . levothyroxine  50 mcg Oral q morning - 10a  . ramipril  2.5 mg Oral Daily  . simvastatin  40 mg Oral q1800   Continuous Infusions: . sodium chloride 75 mL/hr at 11/03/19 2015  . dextrose 50 mL/hr at 11/04/19 0128  . vancomycin 1,500 mg (11/03/19 1608)     LOS: 1 day    Time spent: 30 mins     Wyvonnia Dusky, MD [facility] Pager [phone]  If 7PM-7AM, please contact night-coverage [email or web address] Password [facility] 11/04/2019, 7:52 AM

## 2019-11-05 LAB — COMPREHENSIVE METABOLIC PANEL
ALT: 50 U/L — ABNORMAL HIGH (ref 0–44)
AST: 57 U/L — ABNORMAL HIGH (ref 15–41)
Albumin: 2.4 g/dL — ABNORMAL LOW (ref 3.5–5.0)
Alkaline Phosphatase: 49 U/L (ref 38–126)
Anion gap: 5 (ref 5–15)
BUN: 16 mg/dL (ref 8–23)
CO2: 25 mmol/L (ref 22–32)
Calcium: 8.1 mg/dL — ABNORMAL LOW (ref 8.9–10.3)
Chloride: 109 mmol/L (ref 98–111)
Creatinine, Ser: 0.84 mg/dL (ref 0.61–1.24)
GFR calc Af Amer: >60 mL/min (ref >60)
GFR calc non Af Amer: >60 mL/min (ref >60)
Glucose, Bld: 90 mg/dL (ref 70–99)
Potassium: 3.3 mmol/L — ABNORMAL LOW (ref 3.5–5.1)
Sodium: 139 mmol/L (ref 135–145)
Total Bilirubin: 1.3 mg/dL — ABNORMAL HIGH (ref 0.3–1.2)
Total Protein: 5.5 g/dL — ABNORMAL LOW (ref 6.5–8.1)

## 2019-11-05 LAB — CBC
HCT: 39.1 % (ref 39.0–52.0)
Hemoglobin: 13.4 g/dL (ref 13.0–17.0)
MCH: 35.3 pg — ABNORMAL HIGH (ref 26.0–34.0)
MCHC: 34.3 g/dL (ref 30.0–36.0)
MCV: 102.9 fL — ABNORMAL HIGH (ref 80.0–100.0)
Platelets: 99 10*3/uL — ABNORMAL LOW (ref 150–400)
RBC: 3.8 MIL/uL — ABNORMAL LOW (ref 4.22–5.81)
RDW: 13.8 % (ref 11.5–15.5)
WBC: 10.4 10*3/uL (ref 4.0–10.5)
nRBC: 0 % (ref 0.0–0.2)

## 2019-11-05 LAB — GLUCOSE, CAPILLARY
Glucose-Capillary: 132 mg/dL — ABNORMAL HIGH (ref 70–99)
Glucose-Capillary: 86 mg/dL (ref 70–99)
Glucose-Capillary: 94 mg/dL (ref 70–99)

## 2019-11-05 MED ORDER — TRAMADOL HCL 50 MG PO TABS: 50.0000 mg | ORAL_TABLET | Freq: Four times a day (QID) | ORAL | 0 refills | Status: AC | PRN

## 2019-11-05 MED ORDER — ACETAMINOPHEN 325 MG PO TABS: 650.0000 mg | ORAL_TABLET | Freq: Four times a day (QID) | ORAL | 0 refills | Status: AC | PRN

## 2019-11-05 MED ORDER — TRAMADOL HCL ER 100 MG PO TB24: 100.0000 mg | ORAL_TABLET | Freq: Four times a day (QID) | ORAL | 0 refills | Status: DC | PRN

## 2019-11-05 MED ORDER — POTASSIUM CHLORIDE CRYS ER 20 MEQ PO TBCR
40.0000 meq | EXTENDED_RELEASE_TABLET | Freq: Once | ORAL | Status: AC
  Administered 2019-11-05: 40 meq via ORAL
  Filled 2019-11-05: qty 2

## 2019-11-05 MED ORDER — CEPHALEXIN 500 MG PO CAPS: 500.0000 mg | ORAL_CAPSULE | Freq: Four times a day (QID) | ORAL | 0 refills | Status: AC

## 2019-11-05 MED ORDER — METFORMIN HCL ER 500 MG PO TB24: ORAL_TABLET | ORAL | 34 refills | Status: DC

## 2019-11-05 NOTE — Discharge Summary (Signed)
Physician Discharge Summary  Nicholas Abbott [facility] [id number] DOB: [birth year] DOA: 11/02/2019  PCP: Nicholas Castle, MD  Admit date: 11/02/2019 Discharge date: 11/05/2019  Admitted From: home Disposition: home  Recommendations for Outpatient Follow-up:  1. Follow up with PCP in 1-2 weeks  Home Health: no Equipment/Devices: n/a  Discharge Condition: stable CODE STATUS:full  Diet recommendation: Heart Healthy / Carb Modified   Brief/Interim Summary: HPI was taken from Dr. Blaine Abbott: Nicholas Abbott is a 69 y.o. male with medical history significant of hypertension, hyperlipidemia, diabetes mellitus, GERD, CAD, who presents with right lower leg pain and swelling.  Patient states that he has chronic mild bilateral lower leg edema.  In the past 2 or 3 days, his right lower leg become more swollen, erythematous and painful. It is spreading from right ankle up to knee level. He had chills and fever of 104.4 at home.  Patient denies chest pain, shortness of breath, cough.  No nausea vomiting, diarrhea, abdominal, symptoms of UTI or unilateral weakness. Per report, his wife noticed he was confused this morning and called EMS, states his CBG was 51 and gave him some gel and it got up to 99, ate a PBJ sandwich. His blood sugar is 243 by BMP. When I saw pt in ED, he is alert, oriented x3.  His mental status is normal currently.  Denies unilateral weakness, numbness or tingling his extremities.  No facial droop or slurred speech.  ED Course: pt was found to have WBC 18.2, lactic acid 2.8, INR 1.3, thrombocytopenia with platelet 80, negative urinalysis, pending RVP for COVID-19, electrolytes renal function okay, temperature 99, blood pressure 153/91, heart rate is 74, oxygen saturation 97 to 100% on room air.  X-ray of right knee is negative.  Right lower extremity venous Dopplers negative for DVT.  Patient is placed on MedSurg bed for observation.  Hospital Course from Dr. Lenise Abbott 11/03/19-11/05/19: Pt  was treated initially w/ IV vanco for right leg cellulitis. [place] of RLE was done and was neg for DVT. Pt was continued on home dose of lasix for significant LE edema. Of note, pt's urine cx grew e.coli but pt denied any urinary symptoms. Pt was then switched to IV cefazolin. Pt's RLE cellulitis improved while inpatient but was not resolved at the time of d/c. Pt was d/c home po keflex x 5 days more.    Discharge Diagnoses:  Principal Problem:   Cellulitis of right lower extremity Active Problems:   Hyperlipidemia   Hypertension   Diabetes mellitus without complication (HCC)   CAD (coronary artery disease)   Thrombocytopenia (HCC)   Acute metabolic encephalopathy   Hypoglycemia   Sepsis ([place])   Cellulitis   Sepsis due to cellulitis of right lower extremity & US:[id number]. Continue on IV cefazolin cover both cellulitis & e.coli UTI. Zofran prn for nausea. Tramadol/dilaudid prn for pain. Continue on IVFs. Blood cx NGTD  RLE cellulitis: still significantly swollen, decreased erythema. Continue on IV cefazolin. [place] of RLE neg for DVT. XR right ankle shows diffuse soft tissue swelling and edema, nonspecific. No acute osseous abnormality of the right ankle. Continue on lasix but IV today for significant LE edema   UTI: urine cx is growing e. coli. Changed abx to IV cefazolin as per sens   Lactic acidosis: resolved  Leukocytosis: secondary to infection. Continue on IV abxs  Hyperlipidemia: continue on statin   Hypokalemia: KCl repleated. Will continue to monitor   HTN: continue home dose oframipril. Will continue on home dose  of lasix   DM2: without complication.Last A1c5.5 on 09/05/19, well controled. Will hold home dose of oral hypoglycemic meds. Will continue on SSI w/ accuchecks   CAD: no CP. Continue on aspirin and statin   Thrombocytopenia: etiology unclear. Trending up again today. Unlikely TTP as platelets often <20K w/ MAHA and pt is not anemic. Will continue to monitor    Transaminitis: etiology unclear. Elevated still but trending down. Will continue to monitor   Acute metabolic encephalopathy: resolved.   Hypoglycemia: resolved   Discharge Instructions  Discharge Instructions    Diet - low sodium heart healthy   Complete by: As directed    Diet Carb Modified   Complete by: As directed    Discharge instructions   Complete by: As directed    F/u w/ PCP in 1-2 weeks   Increase activity slowly   Complete by: As directed      Allergies as of 11/05/2019      Reactions   Pioglitazone Rash      Medication List    TAKE these medications   acetaminophen 325 MG tablet Commonly known as: TYLENOL Take 2 tablets (650 mg total) by mouth every 6 (six) hours as needed for mild pain or fever. Notes to patient: As needed   ANTIOXIDANT WITH CO Q-10 PO Take 1 tablet by mouth daily. Notes to patient: Morning 11/06/19   cephALEXin 500 MG capsule Commonly known as: KEFLEX Take 1 capsule (500 mg total) by mouth 4 (four) times daily for 5 days. Notes to patient: Afternoon 11/06/19   Folbic 2.5-25-2 MG Tabs tablet Generic drug: folic acid-pyridoxine-cyancobalamin Take 1 tablet by mouth daily. Notes to patient: Morning 11/06/19   furosemide 40 MG tablet Commonly known as: LASIX Take 40 mg by mouth daily. Notes to patient: Morning 11/06/19   glipiZIDE 10 MG 24 hr tablet Commonly known as: GLUCOTROL XL Take 10 mg by mouth 2 (two) times daily. Notes to patient: Before bed 11/05/19   levothyroxine 50 MCG tablet Commonly known as: SYNTHROID Take 50 mcg by mouth every morning. Notes to patient: Morning 11/06/19   metFORMIN 500 MG 24 hr tablet Commonly known as: GLUCOPHAGE-XR Take 500mg  BID or whatever pt was taking at home What changed:   how much to take  how to take this  when to take this  additional instructions Notes to patient: Keep to home schedule and dose   ramipril 2.5 MG capsule Commonly known as: ALTACE Take 2.5 mg by mouth  daily. Notes to patient: Morning 11/06/19   sildenafil 20 MG tablet Commonly known as: REVATIO Take 1 tablet by mouth daily. Notes to patient: Morning 11/06/19   simvastatin 40 MG tablet Commonly known as: ZOCOR Take 1 tablet by mouth daily at 6 PM. Notes to patient: Evening 11/05/19   traMADol 50 MG tablet Commonly known as: Ultram Take 1 tablet (50 mg total) by mouth every 6 (six) hours as needed for up to 5 days for moderate pain or severe pain.   vitamin B-12 1000 MCG tablet Commonly known as: CYANOCOBALAMIN Take 1,000 mcg by mouth daily. Notes to patient: Morning 11/06/19            Durable Medical Equipment  (From admission, onward)         Start     Ordered   11/02/19 2313  For home use only DME continuous positive airway pressure (CPAP)  Once    Question Answer Comment  Length of Need Lifetime   Patient has OSA  or probable OSA Yes   Is the patient currently using CPAP in the home Yes   If yes (to question two) Determine DME provider and inform them of any new orders/settings   Settings Other see comments   CPAP supplies needed Mask, headgear, cushions, filters, heated tubing and water chamber      11/02/19 2316          Allergies  Allergen Reactions  . Pioglitazone Rash    Consultations:  none   Procedures/Studies: DG Ankle 2 Views Right  Result Date: 11/03/2019 CLINICAL DATA:  Medial right ankle pain and swelling EXAM: RIGHT ANKLE - 2 VIEW COMPARISON:  None. FINDINGS: No acute fracture or malalignment. Ankle mortise is congruent. Tibiotalar joint space is maintained. Small plantar calcaneal spur. Diffuse soft tissue edema, most pronounced over the medial aspect of the ankle. No soft tissue gas. Scattered vascular calcifications. IMPRESSION: 1. Diffuse soft tissue swelling and edema, nonspecific. 2. No acute osseous abnormality of the right ankle. Electronically Signed   By: Davina Poke D.O.   On: 11/03/2019 14:14   US Venous Img Lower Unilateral  Right  Result Date: 11/02/2019 CLINICAL DATA:  Right leg swelling and pain EXAM: RIGHT LOWER EXTREMITY VENOUS DOPPLER ULTRASOUND TECHNIQUE: Gray-scale sonography with graded compression, as well as color Doppler and duplex ultrasound were performed to evaluate the lower extremity deep venous systems from the level of the common femoral vein and including the common femoral, femoral, profunda femoral, popliteal and calf veins including the posterior tibial, peroneal and gastrocnemius veins when visible. The superficial great saphenous vein was also interrogated. Spectral Doppler was utilized to evaluate flow at rest and with distal augmentation maneuvers in the common femoral, femoral and popliteal veins. COMPARISON:  None. FINDINGS: Contralateral Common Femoral Vein: Respiratory phasicity is normal and symmetric with the symptomatic side. No evidence of thrombus. Normal compressibility. Common Femoral Vein: No evidence of thrombus. Normal compressibility, respiratory phasicity and response to augmentation. Saphenofemoral Junction: No evidence of thrombus. Normal compressibility and flow on color Doppler imaging. Profunda Femoral Vein: No evidence of thrombus. Normal compressibility and flow on color Doppler imaging. Femoral Vein: No evidence of thrombus. Normal compressibility, respiratory phasicity and response to augmentation. Popliteal Vein: No evidence of thrombus. Normal compressibility, respiratory phasicity and response to augmentation. Calf Veins: No evidence of thrombus. Normal compressibility and flow on color Doppler imaging. IMPRESSION: No evidence of deep venous thrombosis. Electronically Signed   By: Jerilynn Mages.  Shick M.D.   On: 11/02/2019 14:56   DG Knee Complete 4 Views Right  Result Date: 11/02/2019 CLINICAL DATA:  Redness, swelling of right knee EXAM: RIGHT KNEE - COMPLETE 4+ VIEW COMPARISON:  None. FINDINGS: No acute bony abnormality. Specifically, no fracture, subluxation, or dislocation. No  joint effusion. IMPRESSION: No acute bony abnormality. Electronically Signed   By: Rolm Baptise M.D.   On: 11/02/2019 10:41     Subjective: Pt c/o right knee pain   Discharge Exam: Vitals:   11/04/19 2109 11/05/19 0529  BP: (!) 147/82 (!) 142/83  Pulse: 61 (!) 59  Resp: 20 20  Temp: 99.1 F (37.3 C) 98.4 F (36.9 C)  SpO2: 97% 96%   Vitals:   11/04/19 0420 11/04/19 1151 11/04/19 2109 11/05/19 0529  BP: 136/85 (!) 150/81 (!) 147/82 (!) 142/83  Pulse: (!) 56 64 61 (!) 59  Resp: 20 16 20 20   Temp: 98.2 F (36.8 C) 98.3 F (36.8 C) 99.1 F (37.3 C) 98.4 F (36.9 C)  TempSrc: Oral Oral Oral  Oral  SpO2: 98% 95% 97% 96%  Weight:    125.3 kg  Height:        General: Pt is alert, awake, not in acute distress Cardiovascular:  S1/S2 +, no rubs, no gallops. B/l LE edema R>L Respiratory: diminished breath sounds b/l otherwise clear no wheezing, no rhonchi Abdominal: Soft, NT, obese, bowel sounds + Extremities: no edema, no cyanosis    The results of significant diagnostics from this hospitalization (including imaging, microbiology, ancillary and laboratory) are listed below for reference.     Microbiology: Recent Results (from the past 240 hour(s))  Culture, blood (Routine x 2)     Status: None (Preliminary result)   Collection Time: 11/02/19  9:41 AM   Specimen: BLOOD  Result Value Ref Range Status   Specimen Description BLOOD LEFT ANTECUBITAL  Final   Special Requests   Final    BOTTLES DRAWN AEROBIC AND ANAEROBIC Blood Culture adequate volume   Culture   Final    NO GROWTH 3 DAYS Performed at [facility], [address] [place], [place] [address]    Report Status PENDING  Incomplete  Urine culture     Status: Abnormal   Collection Time: 11/02/19 12:35 PM   Specimen: In/Out Cath Urine  Result Value Ref Range Status   Specimen Description   Final    IN/OUT CATH URINE Performed at [facility], [address] [place], [place] [address]     Special Requests   Final    NONE Performed at [facility], [address], [place], [place] [address]    Culture 10,000 COLONIES/mL ESCHERICHIA COLI (A)  Final   Report Status 11/04/2019 FINAL  Final   Organism ID, Bacteria ESCHERICHIA COLI (A)  Final      Susceptibility   Escherichia coli - MIC*    AMPICILLIN <=2 SENSITIVE Sensitive     CEFAZOLIN <=4 SENSITIVE Sensitive     CEFTRIAXONE <=0.25 SENSITIVE Sensitive     CIPROFLOXACIN <=0.25 SENSITIVE Sensitive     GENTAMICIN <=1 SENSITIVE Sensitive     IMIPENEM <=0.25 SENSITIVE Sensitive     NITROFURANTOIN <=16 SENSITIVE Sensitive     TRIMETH/SULFA <=20 SENSITIVE Sensitive     AMPICILLIN/SULBACTAM <=2 SENSITIVE Sensitive     PIP/TAZO <=4 SENSITIVE Sensitive     * 10,000 COLONIES/mL ESCHERICHIA COLI  Respiratory Panel by RT PCR (Flu A&B, Covid) - Nasopharyngeal Swab     Status: None   Collection Time: 11/02/19  1:42 PM   Specimen: Nasopharyngeal Swab  Result Value Ref Range Status   SARS Coronavirus 2 by RT PCR NEGATIVE NEGATIVE Final    Comment: (NOTE) SARS-CoV-2 target nucleic acids are NOT DETECTED. The SARS-CoV-2 RNA is generally detectable in upper respiratoy specimens during the acute phase of infection. The lowest concentration of SARS-CoV-2 viral copies this assay can detect is 131 copies/mL. A negative result does not preclude SARS-Cov-2 infection and should not be used as the sole basis for treatment or other patient management decisions. A negative result may occur with  improper specimen collection/handling, submission of specimen other than nasopharyngeal swab, presence of viral mutation(s) within the areas targeted by this assay, and inadequate number of viral copies (<131 copies/mL). A negative result must be combined with clinical observations, patient history, and epidemiological information. The expected result is Negative. Fact Sheet for Patients:  PinkCheek.be Fact Sheet  for Healthcare Providers:  GravelBags.it This test is not yet ap proved or cleared by the [place] and  has been authorized for detection and/or diagnosis of SARS-CoV-2 by FDA under an Emergency Use Authorization (EUA). This EUA will remain  in effect (meaning this test can be used) for the duration of the COVID-19 declaration under Section 564(b)(1) of the Act, 21 U.S.C. section 360bbb-3(b)(1), unless the authorization is terminated or revoked sooner.    Influenza A by PCR NEGATIVE NEGATIVE Final   Influenza B by PCR NEGATIVE NEGATIVE Final    Comment: (NOTE) The Xpert Xpress SARS-CoV-2/FLU/RSV assay is intended as an aid in  the diagnosis of influenza from Nasopharyngeal swab specimens and  should not be used as a sole basis for treatment. Nasal washings and  aspirates are unacceptable for Xpert Xpress SARS-CoV-2/FLU/RSV  testing. Fact Sheet for Patients: PinkCheek.be Fact Sheet for Healthcare Providers: GravelBags.it This test is not yet approved or cleared by the [place] FDA and  has been authorized for detection and/or diagnosis of SARS-CoV-2 by  FDA under an Emergency Use Authorization (EUA). This EUA will remain  in effect (meaning this test can be used) for the duration of the  Covid-19 declaration under Section 564(b)(1) of the Act, 21  U.S.C. section 360bbb-3(b)(1), unless the authorization is  terminated or revoked. Performed at [facility], [address] [place], [place] [address]   Culture, blood (Routine x 2)     Status: None (Preliminary result)   Collection Time: 11/03/19  6:23 AM   Specimen: BLOOD  Result Value Ref Range Status   Specimen Description BLOOD LEFT ANTECUBITAL  Final   Special Requests   Final    BOTTLES DRAWN AEROBIC AND ANAEROBIC Blood Culture adequate volume   Culture   Final    NO GROWTH 2 DAYS Performed at [facility],  [address], [place], [place] [address]    Report Status PENDING  Incomplete     Labs: BNP (last 3 results) No results for input(s): BNP in the last 8760 hours. Basic Metabolic Panel: Recent Labs  Lab 11/02/19 0941 11/03/19 0623 11/04/19 0320 11/05/19 0536  NA 134* 138 139 139  K 4.1 3.9 3.6 3.3*  CL 103 113* 110 109  CO2 24 21* 22 25  GLUCOSE 243* 121* 103* 90  BUN 24* 16 16 16   CREATININE 0.99 0.95 0.94 0.84  CALCIUM 8.6* 8.3* 7.8* 8.1*   Liver Function Tests: Recent Labs  Lab 11/02/19 0941 11/05/19 0536  AST 98* 57*  ALT 67* 50*  ALKPHOS 45 49  BILITOT 1.7* 1.3*  PROT 6.6 5.5*  ALBUMIN 3.1* 2.4*   No results for input(s): LIPASE, AMYLASE in the last 168 hours. No results for input(s): AMMONIA in the last 168 hours. CBC: Recent Labs  Lab 11/02/19 0941 11/03/19 0623 11/04/19 0320 11/05/19 0536  WBC 18.2* 14.4* 11.7* 10.4  NEUTROABS 13.1*  --   --   --   HGB 14.8 13.5 13.1 13.4  HCT 43.5 36.9* 36.3* 39.1  MCV 103.6* 98.7 100.0 102.9*  PLT 80* 84* 92* 99*   Cardiac Enzymes: No results for input(s): CKTOTAL, CKMB, CKMBINDEX, TROPONINI in the last 168 hours. BNP: Invalid input(s): POCBNP CBG: Recent Labs  Lab 11/04/19 1640 11/04/19 2152 11/05/19 0053 11/05/19 0746 11/05/19 0841  GLUCAP 122* 86 132* 94 86   D-Dimer No results for input(s): DDIMER in the last 72 hours. Hgb A1c No results for input(s): HGBA1C in the last 72 hours. Lipid Profile No results for input(s): CHOL, HDL, LDLCALC, TRIG, CHOLHDL, LDLDIRECT in the last 72 hours. Thyroid function studies  No results for input(s): TSH, T4TOTAL, T3FREE, THYROIDAB in the last 72 hours.  Invalid input(s): FREET3 Anemia work up No results for input(s): VITAMINB12, FOLATE, FERRITIN, TIBC, IRON, RETICCTPCT in the last 72 hours. Urinalysis    Component Value Date/Time   COLORURINE YELLOW (A) 11/02/2019 0956   APPEARANCEUR HAZY (A) 11/02/2019 0956   LABSPEC 1.012 11/02/2019 0956   PHURINE  6.0 11/02/2019 0956   GLUCOSEU 50 (A) 11/02/2019 0956   HGBUR LARGE (A) 11/02/2019 0956   BILIRUBINUR NEGATIVE 11/02/2019 0956   KETONESUR NEGATIVE 11/02/2019 0956   PROTEINUR NEGATIVE 11/02/2019 0956   NITRITE NEGATIVE 11/02/2019 0956   LEUKOCYTESUR NEGATIVE 11/02/2019 0956   Sepsis Labs Invalid input(s): PROCALCITONIN,  WBC,  LACTICIDVEN Microbiology Recent Results (from the past 240 hour(s))  Culture, blood (Routine x 2)     Status: None (Preliminary result)   Collection Time: 11/02/19  9:41 AM   Specimen: BLOOD  Result Value Ref Range Status   Specimen Description BLOOD LEFT ANTECUBITAL  Final   Special Requests   Final    BOTTLES DRAWN AEROBIC AND ANAEROBIC Blood Culture adequate volume   Culture   Final    NO GROWTH 3 DAYS Performed at [facility], [address], [place], [place] [address]    Report Status PENDING  Incomplete  Urine culture     Status: Abnormal   Collection Time: 11/02/19 12:35 PM   Specimen: In/Out Cath Urine  Result Value Ref Range Status   Specimen Description   Final    IN/OUT CATH URINE Performed at [facility], [address] [place], [place] [address]    Special Requests   Final    NONE Performed at [facility], [address] [place], [place] [address]    Culture 10,000 COLONIES/mL ESCHERICHIA COLI (A)  Final   Report Status 11/04/2019 FINAL  Final   Organism ID, Bacteria ESCHERICHIA COLI (A)  Final      Susceptibility   Escherichia coli - MIC*    AMPICILLIN <=2 SENSITIVE Sensitive     CEFAZOLIN <=4 SENSITIVE Sensitive     CEFTRIAXONE <=0.25 SENSITIVE Sensitive     CIPROFLOXACIN <=0.25 SENSITIVE Sensitive     GENTAMICIN <=1 SENSITIVE Sensitive     IMIPENEM <=0.25 SENSITIVE Sensitive     NITROFURANTOIN <=16 SENSITIVE Sensitive     TRIMETH/SULFA <=20 SENSITIVE Sensitive     AMPICILLIN/SULBACTAM <=2 SENSITIVE Sensitive     PIP/TAZO <=4 SENSITIVE Sensitive     * 10,000 COLONIES/mL ESCHERICHIA COLI   Respiratory Panel by RT PCR (Flu A&B, Covid) - Nasopharyngeal Swab     Status: None   Collection Time: 11/02/19  1:42 PM   Specimen: Nasopharyngeal Swab  Result Value Ref Range Status   SARS Coronavirus 2 by RT PCR NEGATIVE NEGATIVE Final    Comment: (NOTE) SARS-CoV-2 target nucleic acids are NOT DETECTED. The SARS-CoV-2 RNA is generally detectable in upper respiratoy specimens during the acute phase of infection. The lowest concentration of SARS-CoV-2 viral copies this assay can detect is 131 copies/mL. A negative result does not preclude SARS-Cov-2 infection and should not be used as the sole basis for treatment or other patient management decisions. A negative result may occur with  improper specimen collection/handling, submission of specimen other than nasopharyngeal swab, presence of viral mutation(s) within the areas targeted by this assay, and inadequate number of viral copies (<131 copies/mL). A negative result must be combined with clinical observations, patient history, and epidemiological information. The expected result is  Negative. Fact Sheet for Patients:  PinkCheek.be Fact Sheet for Healthcare Providers:  GravelBags.it This test is not yet ap proved or cleared by the [place] FDA and  has been authorized for detection and/or diagnosis of SARS-CoV-2 by FDA under an Emergency Use Authorization (EUA). This EUA will remain  in effect (meaning this test can be used) for the duration of the COVID-19 declaration under Section 564(b)(1) of the Act, 21 U.S.C. section 360bbb-3(b)(1), unless the authorization is terminated or revoked sooner.    Influenza A by PCR NEGATIVE NEGATIVE Final   Influenza B by PCR NEGATIVE NEGATIVE Final    Comment: (NOTE) The Xpert Xpress SARS-CoV-2/FLU/RSV assay is intended as an aid in  the diagnosis of influenza from Nasopharyngeal swab specimens and  should not be used as a sole  basis for treatment. Nasal washings and  aspirates are unacceptable for Xpert Xpress SARS-CoV-2/FLU/RSV  testing. Fact Sheet for Patients: PinkCheek.be Fact Sheet for Healthcare Providers: GravelBags.it This test is not yet approved or cleared by the [place] FDA and  has been authorized for detection and/or diagnosis of SARS-CoV-2 by  FDA under an Emergency Use Authorization (EUA). This EUA will remain  in effect (meaning this test can be used) for the duration of the  Covid-19 declaration under Section 564(b)(1) of the Act, 21  U.S.C. section 360bbb-3(b)(1), unless the authorization is  terminated or revoked. Performed at [facility], [address] [place], [place] [address]   Culture, blood (Routine x 2)     Status: None (Preliminary result)   Collection Time: 11/03/19  6:23 AM   Specimen: BLOOD  Result Value Ref Range Status   Specimen Description BLOOD LEFT ANTECUBITAL  Final   Special Requests   Final    BOTTLES DRAWN AEROBIC AND ANAEROBIC Blood Culture adequate volume   Culture   Final    NO GROWTH 2 DAYS Performed at [facility], [address], [place], [place] [address]    Report Status PENDING  Incomplete     Time coordinating discharge: Over 30 minutes  SIGNED:   Wyvonnia Dusky, MD  [facility] 11/05/2019, 3:06 PM Pager   If 7PM-7AM, please contact night-coverage [email or web address] Password [email or web address]

## 2019-11-06 LAB — PATHOLOGIST SMEAR REVIEW

## 2019-11-07 LAB — CULTURE, BLOOD (ROUTINE X 2)
Culture: NO GROWTH
Special Requests: ADEQUATE

## 2019-11-08 LAB — CULTURE, BLOOD (ROUTINE X 2)
Culture: NO GROWTH
Special Requests: ADEQUATE

## 2019-12-28 ENCOUNTER — Other Ambulatory Visit
Admission: RE | Admit: 2019-12-28 | Discharge: 2019-12-28 | Disposition: A | Discharge status: Home / Self Care | Payer: Medicare Other | Source: Ambulatory Visit | Attending: Internal Medicine | Admitting: Internal Medicine

## 2019-12-28 ENCOUNTER — Other Ambulatory Visit: Payer: Self-pay

## 2019-12-28 DIAGNOSIS — Z20822 Contact with and (suspected) exposure to covid-19: Secondary | ICD-10-CM | POA: Insufficient documentation

## 2019-12-28 DIAGNOSIS — Z01812 Encounter for preprocedural laboratory examination: Secondary | ICD-10-CM | POA: Diagnosis present

## 2019-12-28 LAB — SARS CORONAVIRUS 2 (TAT 6-24 HRS): SARS Coronavirus 2: NEGATIVE

## 2019-12-29 ENCOUNTER — Encounter: Payer: Self-pay | Admitting: Internal Medicine

## 2020-01-01 ENCOUNTER — Encounter
Admission: RE | Disposition: A | Discharge status: Home / Self Care | Payer: Self-pay | Source: Home / Self Care | Attending: Internal Medicine

## 2020-01-01 ENCOUNTER — Other Ambulatory Visit: Payer: Self-pay

## 2020-01-01 ENCOUNTER — Ambulatory Visit
Admission: RE | Admit: 2020-01-01 | Discharge: 2020-01-01 | Disposition: A | Discharge status: Home / Self Care | Payer: Medicare Other | Attending: Internal Medicine | Admitting: Internal Medicine

## 2020-01-01 ENCOUNTER — Ambulatory Visit: Payer: Medicare Other | Admitting: Anesthesiology

## 2020-01-01 ENCOUNTER — Encounter: Payer: Self-pay | Admitting: Internal Medicine

## 2020-01-01 DIAGNOSIS — I10 Essential (primary) hypertension: Secondary | ICD-10-CM | POA: Insufficient documentation

## 2020-01-01 DIAGNOSIS — G4730 Sleep apnea, unspecified: Secondary | ICD-10-CM | POA: Diagnosis not present

## 2020-01-01 DIAGNOSIS — K2970 Gastritis, unspecified, without bleeding: Secondary | ICD-10-CM | POA: Diagnosis not present

## 2020-01-01 DIAGNOSIS — Z1211 Encounter for screening for malignant neoplasm of colon: Secondary | ICD-10-CM | POA: Diagnosis not present

## 2020-01-01 DIAGNOSIS — Z9861 Coronary angioplasty status: Secondary | ICD-10-CM | POA: Insufficient documentation

## 2020-01-01 DIAGNOSIS — Z7984 Long term (current) use of oral hypoglycemic drugs: Secondary | ICD-10-CM | POA: Diagnosis not present

## 2020-01-01 DIAGNOSIS — Z79890 Hormone replacement therapy (postmenopausal): Secondary | ICD-10-CM | POA: Diagnosis not present

## 2020-01-01 DIAGNOSIS — I252 Old myocardial infarction: Secondary | ICD-10-CM | POA: Diagnosis not present

## 2020-01-01 DIAGNOSIS — E785 Hyperlipidemia, unspecified: Secondary | ICD-10-CM | POA: Diagnosis not present

## 2020-01-01 DIAGNOSIS — K5730 Diverticulosis of large intestine without perforation or abscess without bleeding: Secondary | ICD-10-CM | POA: Diagnosis not present

## 2020-01-01 DIAGNOSIS — K7469 Other cirrhosis of liver: Secondary | ICD-10-CM | POA: Diagnosis not present

## 2020-01-01 DIAGNOSIS — E119 Type 2 diabetes mellitus without complications: Secondary | ICD-10-CM | POA: Insufficient documentation

## 2020-01-01 DIAGNOSIS — K222 Esophageal obstruction: Secondary | ICD-10-CM | POA: Diagnosis not present

## 2020-01-01 DIAGNOSIS — I2510 Atherosclerotic heart disease of native coronary artery without angina pectoris: Secondary | ICD-10-CM | POA: Diagnosis not present

## 2020-01-01 DIAGNOSIS — Z86010 Personal history of colonic polyps: Secondary | ICD-10-CM | POA: Insufficient documentation

## 2020-01-01 DIAGNOSIS — K640 First degree hemorrhoids: Secondary | ICD-10-CM | POA: Diagnosis not present

## 2020-01-01 DIAGNOSIS — Z79899 Other long term (current) drug therapy: Secondary | ICD-10-CM | POA: Insufficient documentation

## 2020-01-01 DIAGNOSIS — K449 Diaphragmatic hernia without obstruction or gangrene: Secondary | ICD-10-CM | POA: Insufficient documentation

## 2020-01-01 HISTORY — PX: COLONOSCOPY WITH PROPOFOL: SHX5780

## 2020-01-01 HISTORY — PX: ESOPHAGOGASTRODUODENOSCOPY (EGD) WITH PROPOFOL: SHX5813

## 2020-01-01 LAB — GLUCOSE, CAPILLARY: Glucose-Capillary: 137 mg/dL — ABNORMAL HIGH (ref 70–99)

## 2020-01-01 SURGERY — COLONOSCOPY WITH PROPOFOL
Anesthesia: General

## 2020-01-01 MED ORDER — SODIUM CHLORIDE 0.9 % IV SOLN
INTRAVENOUS | Status: DC
  Administered 2020-01-01: 1000 mL via INTRAVENOUS

## 2020-01-01 MED ORDER — MIDAZOLAM HCL 5 MG/5ML IJ SOLN
INTRAMUSCULAR | Status: DC | PRN
  Administered 2020-01-01: 2 mg via INTRAVENOUS

## 2020-01-01 MED ORDER — PROPOFOL 10 MG/ML IV BOLUS
INTRAVENOUS | Status: DC | PRN
  Administered 2020-01-01: 20 mg via INTRAVENOUS
  Administered 2020-01-01: 30 mg via INTRAVENOUS

## 2020-01-01 MED ORDER — LIDOCAINE HCL (PF) 2 % IJ SOLN
INTRAMUSCULAR | Status: DC | PRN
  Administered 2020-01-01: 100 mg

## 2020-01-01 MED ORDER — MIDAZOLAM HCL 2 MG/2ML IJ SOLN
INTRAMUSCULAR | Status: AC
  Filled 2020-01-01: qty 2

## 2020-01-01 MED ORDER — FENTANYL CITRATE (PF) 100 MCG/2ML IJ SOLN
INTRAMUSCULAR | Status: DC | PRN
  Administered 2020-01-01 (×2): 50 ug via INTRAVENOUS

## 2020-01-01 MED ORDER — FENTANYL CITRATE (PF) 100 MCG/2ML IJ SOLN
INTRAMUSCULAR | Status: AC
  Filled 2020-01-01: qty 2

## 2020-01-01 MED ORDER — PROPOFOL 500 MG/50ML IV EMUL
INTRAVENOUS | Status: DC | PRN
  Administered 2020-01-01: 50 ug/kg/min via INTRAVENOUS

## 2020-01-01 NOTE — Transfer of Care (Signed)
Immediate Anesthesia Transfer of Care Note  Patient: Nicholas Abbott  Procedure(s) Performed: COLONOSCOPY WITH PROPOFOL (N/A ) ESOPHAGOGASTRODUODENOSCOPY (EGD) WITH PROPOFOL (N/A )  Patient Location: PACU  Anesthesia Type:General  Level of Consciousness: sedated  Airway & Oxygen Therapy: Patient Spontanous Breathing and Patient connected to nasal cannula oxygen  Post-op Assessment: Report given to RN and Post -op Vital signs reviewed and stable  Post vital signs: Reviewed and stable  Last Vitals:  Vitals Value Taken Time  BP    Temp    Pulse 67 01/01/20 1049  Resp 13 01/01/20 1049  SpO2 97 % 01/01/20 1049  Vitals shown include unvalidated device data.  Last Pain:  Vitals:   01/01/20 0938  TempSrc: Temporal  PainSc: 0-No pain         Complications: No apparent anesthesia complications

## 2020-01-01 NOTE — Op Note (Addendum)
[facility] Patient Name: Nicholas [facility] Procedure Date: 01/01/2020 10:00 AM MRN: [id number] Account #: [id number] Date of Birth: 01-13-1951 Admit Type: Outpatient Age: 69 Room: [facility] ENDO ROOM 3 Gender: Male Note Status: Supervisor Override Procedure:             Colonoscopy Indications:           Personal history of colonic polyps Providers:             Benay Pike. Alice Reichert MD, MD Referring MD:          Wynona Canes. Kym Groom, MD (Referring MD) Medicines:             Propofol per Anesthesia Complications:         No immediate complications. Procedure:             Pre-Anesthesia Assessment:                        - The risks and benefits of the procedure and the                         sedation options and risks were discussed with the                         patient. All questions were answered and informed                         consent was obtained.                        - Patient identification and proposed procedure were                         verified prior to the procedure by the nurse. The                         procedure was verified in the procedure room.                        - ASA Grade Assessment: III - A patient with severe                         systemic disease.                        After obtaining informed consent, the colonoscope was                         passed under direct vision. Throughout the procedure,                         the patient's blood pressure, pulse, and oxygen                         saturations were monitored continuously. The                         Colonoscope was introduced through the anus and  advanced to the the cecum, identified by appendiceal                         orifice and ileocecal valve. The colonoscopy was                         performed without difficulty. The patient tolerated                         the procedure well. The quality of the bowel                          preparation was adequate. The ileocecal valve,                         appendiceal orifice, and rectum were photographed. Findings:      The perianal and digital rectal examinations were normal. Pertinent       negatives include normal sphincter tone and no palpable rectal lesions.      Many medium-mouthed diverticula were found in the sigmoid colon.      Non-bleeding internal hemorrhoids were found during retroflexion. The       hemorrhoids were Grade I (internal hemorrhoids that do not prolapse).      The exam was otherwise without abnormality. Impression:            - Diverticulosis in the sigmoid colon.                        - Non-bleeding internal hemorrhoids.                        - The examination was otherwise normal.                        - No specimens collected. Recommendation:        - Patient has a contact number available for                         emergencies. The signs and symptoms of potential                         delayed complications were discussed with the patient.                         Return to normal activities tomorrow. Written                         discharge instructions were provided to the patient.                        - Resume previous diet.                        - Continue present medications.                        - Repeat colonoscopy in 10 years for screening                         purposes.                        -  Return to nurse practitioner in 6 months.                        - The findings and recommendations were discussed with                         the patient.                        - Follow up with Stephens November, GI Nurse                         Practioner, in office to discuss results and monitor                         progress. Procedure Code(s):     --- Professional ---                        [id number], Colorectal cancer screening; colonoscopy on                         individual not meeting criteria for high  risk Diagnosis Code(s):     --- Professional ---                        K57.30, Diverticulosis of large intestine without                         perforation or abscess without bleeding                        K64.0, First degree hemorrhoids                        Z12.11, Encounter for screening for malignant neoplasm                         of colon CPT copyright 2019 [employer]. All rights reserved. The codes documented in this report are preliminary and upon coder review may  be revised to meet current compliance requirements. Efrain Sella MD, MD 01/01/2020 10:47:59 AM This report has been signed electronically. Number of Addenda: 0 Note Initiated On: 01/01/2020 10:00 AM Scope Withdrawal Time: 0 hours 7 minutes 3 seconds  Total Procedure Duration: 0 hours 15 minutes 25 seconds  Estimated Blood Loss:  Estimated blood loss: none.      [facility]

## 2020-01-01 NOTE — Op Note (Signed)
[facility] Patient Name: Even Polio [facility] Procedure Date: 01/01/2020 10:00 AM MRN: [id number] Account #: [id number] Date of Birth: 1951/06/08 Admit Type: Outpatient Age: 69 Room: [facility] ENDO ROOM 3 Gender: Male Note Status: Finalized Procedure:             Upper GI endoscopy Indications:           Variceal screening (no known varices or prior                         bleeding), Suspected portal hypertensive gastropathy,                         Cirrhosis Providers:             Benay Pike. Alice Reichert MD, MD Referring MD:          Wynona Canes. Kym Groom, MD (Referring MD) Medicines:             Propofol per Anesthesia Complications:         No immediate complications. Procedure:             Pre-Anesthesia Assessment:                        - The risks and benefits of the procedure and the                         sedation options and risks were discussed with the                         patient. All questions were answered and informed                         consent was obtained.                        - Patient identification and proposed procedure were                         verified prior to the procedure by the nurse. The                         procedure was verified in the procedure room.                        - ASA Grade Assessment: III - A patient with severe                         systemic disease.                        - After reviewing the risks and benefits, the patient                         was deemed in satisfactory condition to undergo the                         procedure.                        After obtaining informed consent, the endoscope was  passed under direct vision. Throughout the procedure,                         the patient's blood pressure, pulse, and oxygen                         saturations were monitored continuously. The Endoscope                         was introduced through the mouth, and advanced to  the                         third part of duodenum. The upper GI endoscopy was                         accomplished without difficulty. The patient tolerated                         the procedure well. Findings:      A widely patent Schatzki ring was found in the distal esophagus.      There is no endoscopic evidence of salmon-colored mucosa, stricture or       varices in the entire esophagus.      A 3 cm hiatal hernia was present.      Patchy moderate inflammation characterized by erosions and erythema was       found in the gastric antrum.      The examined duodenum was normal.      The exam was otherwise without abnormality. Impression:            - Widely patent Schatzki ring.                        - 3 cm hiatal hernia.                        - Gastritis.                        - Normal examined duodenum.                        - The examination was otherwise normal.                        - No specimens collected. Recommendation:        - Repeat upper endoscopy in 3 years for screening                         purposes.                        - Proceed with colonoscopy Procedure Code(s):     --- Professional ---                        [id number], Esophagogastroduodenoscopy, flexible,                         transoral; diagnostic, including collection of  specimen(s) by brushing or washing, when performed                         (separate procedure) Diagnosis Code(s):     --- Professional ---                        Z13.810, Encounter for screening for upper                         gastrointestinal disorder                        K29.70, Gastritis, unspecified, without bleeding                        K44.9, Diaphragmatic hernia without obstruction or                         gangrene                        K22.2, Esophageal obstruction CPT copyright 2019 American Medical Association. All rights reserved. The codes documented in this report are preliminary and  upon coder review may  be revised to meet current compliance requirements. Efrain Sella MD, MD 01/01/2020 10:28:39 AM This report has been signed electronically. Number of Addenda: 0 Note Initiated On: 01/01/2020 10:00 AM Estimated Blood Loss:  Estimated blood loss: none.      [facility]

## 2020-01-01 NOTE — H&P (Signed)
Outpatient short stay form Pre-procedure 01/01/2020 9:51 AM Nicholas Abbott K. Alice Reichert, M.D.  Primary Physician: Johny Drilling, M.D.  Reason for visit:  Portal hypertension, cryptogenic cirrhosis, Personal hx of adenomatous colon polyps -04/26/06  History of present illness:  Patient with cryptogenic cirrhosis and elevated liver enzymes presents for screening/surveillance for esophageal varices. Patient denies intractable heartburn, dysphagia, hemetemesis, abdominal pain, nausea or vomiting.                            Patient presents for colonoscopy for a personal hx of colon polyps. The patient denies abdominal pain, abnormal weight loss or rectal bleeding.      Current Facility-Administered Medications:  .  0.9 %  sodium chloride infusion, , Intravenous, Continuous, Liticia Gasior, Benay Pike, MD  Medications Prior to Admission  Medication Sig Dispense Refill Last Dose  . folic acid-pyridoxine-cyancobalamin (FOLBIC) 2.5-25-2 MG TABS tablet Take 1 tablet by mouth daily.   12/31/2019 at Unknown time  . furosemide (LASIX) 40 MG tablet Take 40 mg by mouth daily.   12/31/2019 at Unknown time  . levothyroxine (SYNTHROID) 50 MCG tablet Take 50 mcg by mouth every morning.    01/01/2020 at 0720  . metFORMIN (GLUCOPHAGE-XR) 500 MG 24 hr tablet Take 500mg  BID or whatever pt was taking at home 60 tablet 34 12/31/2019 at Unknown time  . ramipril (ALTACE) 2.5 MG capsule Take 2.5 mg by mouth daily.   01/01/2020 at 0720  . simvastatin (ZOCOR) 40 MG tablet Take 1 tablet by mouth daily at 6 PM.    12/31/2019 at Unknown time  . vitamin B-12 (CYANOCOBALAMIN) 1000 MCG tablet Take 1,000 mcg by mouth daily.   Past Week at Unknown time  . glipiZIDE (GLUCOTROL XL) 10 MG 24 hr tablet Take 10 mg by mouth 2 (two) times daily.   Not Taking at Unknown time  . Nutritional Supplements (ANTIOXIDANT WITH CO Q-10 PO) Take 1 tablet by mouth daily.     . sildenafil (REVATIO) 20 MG tablet Take 1 tablet by mouth daily.      . traMADol (ULTRAM) 50 MG  tablet Take by mouth every 6 (six) hours as needed.   Not Taking at Unknown time     Allergies  Allergen Reactions  . Pioglitazone Rash     Past Medical History:  Diagnosis Date  . Diabetes mellitus without complication ([place])   . GERD (gastroesophageal reflux disease)   . Hyperlipidemia   . Hypertension   . Left shoulder pain   . Myocardial infarction [facility])     Review of systems:  Otherwise negative.    Physical Exam  Gen: Alert, oriented. Appears stated age.  HEENT: [place]/AT. PERRLA. Lungs: CTA, no wheezes. CV: RR nl S1, S2. Abd: soft, benign, no masses. BS+ Ext: No edema. Pulses 2+    Planned procedures: Proceed with EGD and  colonoscopy. The patient understands the nature of the planned procedure, indications, risks, alternatives and potential complications including but not limited to bleeding, infection, perforation, damage to internal organs and possible oversedation/side effects from anesthesia. The patient agrees and gives consent to proceed.  Please refer to procedure notes for findings, recommendations and patient disposition/instructions.     Evelen Vazguez K. Alice Reichert, M.D. Gastroenterology 01/01/2020  9:51 AM

## 2020-01-01 NOTE — Interval H&P Note (Signed)
History and Physical Interval Note:  01/01/2020 9:53 AM  Nicholas Abbott  has presented today for surgery, with the diagnosis of SCREENING ELEVATED LIVER ENZYMES.  The various methods of treatment have been discussed with the patient and family. After consideration of risks, benefits and other options for treatment, the patient has consented to  Procedure(s): COLONOSCOPY WITH PROPOFOL (N/A) ESOPHAGOGASTRODUODENOSCOPY (EGD) WITH PROPOFOL (N/A) as a surgical intervention.  The patient's history has been reviewed, patient examined, no change in status, stable for surgery.  I have reviewed the patient's chart and labs.  Questions were answered to the patient's satisfaction.     [place], [place]

## 2020-01-01 NOTE — Anesthesia Preprocedure Evaluation (Addendum)
Anesthesia Evaluation  Patient identified by MRN, date of birth, ID band Patient awake    Reviewed: Allergy & Precautions, H&P , NPO status , Patient's Chart, lab work & pertinent test results  History of Anesthesia Complications Negative for: history of anesthetic complications  Airway Mallampati: III  TM Distance: >3 FB Neck ROM: full    Dental  (+) Chipped   Pulmonary sleep apnea and Continuous Positive Airway Pressure Ventilation ,           Cardiovascular hypertension, + CAD and + Past MI       Neuro/Psych negative neurological ROS  negative psych ROS   GI/Hepatic Neg liver ROS, GERD  ,  Endo/Other  diabetes  Renal/GU negative Renal ROS  negative genitourinary   Musculoskeletal   Abdominal   Peds  Hematology negative hematology ROS (+) thrombocytopenia   Anesthesia Other Findings Past Medical History: No date: Diabetes mellitus without complication (HCC) No date: GERD (gastroesophageal reflux disease) No date: Hyperlipidemia No date: Hypertension No date: Left shoulder pain No date: Myocardial infarction [facility])  Past Surgical History: No date: CATARACT EXTRACTION No date: CORONARY ANGIOPLASTY No date: RETINAL DETACHMENT SURGERY     Reproductive/Obstetrics negative OB ROS                            Anesthesia Physical Anesthesia Plan  ASA: III  Anesthesia Plan: General   Post-op Pain Management:    Induction:   PONV Risk Score and Plan: Propofol infusion and TIVA  Airway Management Planned:   Additional Equipment:   Intra-op Plan:   Post-operative Plan:   Informed Consent: I have reviewed the patients History and Physical, chart, labs and discussed the procedure including the risks, benefits and alternatives for the proposed anesthesia with the patient or authorized representative who has indicated his/her understanding and acceptance.     Dental Advisory  Given  Plan Discussed with: Anesthesiologist  Anesthesia Plan Comments:         Anesthesia Quick Evaluation

## 2020-01-01 NOTE — Anesthesia Postprocedure Evaluation (Signed)
Anesthesia Post Note  Patient: Tammi Klippel  Procedure(s) Performed: COLONOSCOPY WITH PROPOFOL (N/A ) ESOPHAGOGASTRODUODENOSCOPY (EGD) WITH PROPOFOL (N/A )  Patient location during evaluation: PACU Anesthesia Type: General Level of consciousness: awake and alert Pain management: pain level controlled Vital Signs Assessment: post-procedure vital signs reviewed and stable Respiratory status: spontaneous breathing, nonlabored ventilation and respiratory function stable Cardiovascular status: blood pressure returned to baseline and stable Postop Assessment: no apparent nausea or vomiting Anesthetic complications: no     Last Vitals:  Vitals:   01/01/20 1049 01/01/20 1109  BP: 120/73 (!) 126/95  Pulse:    Resp:    Temp: (!) 36.3 C   SpO2:      Last Pain:  Vitals:   01/01/20 1119  TempSrc:   PainSc: 0-No pain                 Tera Mater

## 2020-01-02 ENCOUNTER — Encounter: Payer: Self-pay | Admitting: *Deleted

## 2020-01-28 ENCOUNTER — Encounter: Payer: Self-pay | Admitting: Emergency Medicine

## 2020-01-28 ENCOUNTER — Other Ambulatory Visit: Payer: Self-pay

## 2020-01-28 ENCOUNTER — Inpatient Hospital Stay
Admission: EM | Admit: 2020-01-28 | Discharge: 2020-01-31 | DRG: 603 | Disposition: A | Discharge status: Home / Self Care | Payer: Medicare Other | Attending: Hospitalist | Admitting: Hospitalist

## 2020-01-28 DIAGNOSIS — R7989 Other specified abnormal findings of blood chemistry: Secondary | ICD-10-CM | POA: Diagnosis present

## 2020-01-28 DIAGNOSIS — L03115 Cellulitis of right lower limb: Principal | ICD-10-CM

## 2020-01-28 DIAGNOSIS — E669 Obesity, unspecified: Secondary | ICD-10-CM | POA: Diagnosis present

## 2020-01-28 DIAGNOSIS — E785 Hyperlipidemia, unspecified: Secondary | ICD-10-CM

## 2020-01-28 DIAGNOSIS — E1165 Type 2 diabetes mellitus with hyperglycemia: Secondary | ICD-10-CM | POA: Diagnosis present

## 2020-01-28 DIAGNOSIS — R739 Hyperglycemia, unspecified: Secondary | ICD-10-CM

## 2020-01-28 DIAGNOSIS — Z79890 Hormone replacement therapy (postmenopausal): Secondary | ICD-10-CM

## 2020-01-28 DIAGNOSIS — I252 Old myocardial infarction: Secondary | ICD-10-CM

## 2020-01-28 DIAGNOSIS — D696 Thrombocytopenia, unspecified: Secondary | ICD-10-CM | POA: Diagnosis not present

## 2020-01-28 DIAGNOSIS — Z888 Allergy status to other drugs, medicaments and biological substances status: Secondary | ICD-10-CM

## 2020-01-28 DIAGNOSIS — L0390 Cellulitis, unspecified: Secondary | ICD-10-CM | POA: Diagnosis present

## 2020-01-28 DIAGNOSIS — I2510 Atherosclerotic heart disease of native coronary artery without angina pectoris: Secondary | ICD-10-CM | POA: Diagnosis present

## 2020-01-28 DIAGNOSIS — K219 Gastro-esophageal reflux disease without esophagitis: Secondary | ICD-10-CM | POA: Diagnosis present

## 2020-01-28 DIAGNOSIS — Z6835 Body mass index (BMI) 35.0-35.9, adult: Secondary | ICD-10-CM

## 2020-01-28 DIAGNOSIS — Z23 Encounter for immunization: Secondary | ICD-10-CM

## 2020-01-28 DIAGNOSIS — Z79899 Other long term (current) drug therapy: Secondary | ICD-10-CM

## 2020-01-28 DIAGNOSIS — E119 Type 2 diabetes mellitus without complications: Secondary | ICD-10-CM | POA: Diagnosis not present

## 2020-01-28 DIAGNOSIS — Z20822 Contact with and (suspected) exposure to covid-19: Secondary | ICD-10-CM | POA: Diagnosis present

## 2020-01-28 DIAGNOSIS — Z806 Family history of leukemia: Secondary | ICD-10-CM

## 2020-01-28 DIAGNOSIS — I10 Essential (primary) hypertension: Secondary | ICD-10-CM | POA: Diagnosis present

## 2020-01-28 LAB — COMPREHENSIVE METABOLIC PANEL
ALT: 63 U/L — ABNORMAL HIGH (ref 0–44)
AST: 67 U/L — ABNORMAL HIGH (ref 15–41)
Albumin: 3.2 g/dL — ABNORMAL LOW (ref 3.5–5.0)
Alkaline Phosphatase: 66 U/L (ref 38–126)
Anion gap: 7 (ref 5–15)
BUN: 24 mg/dL — ABNORMAL HIGH (ref 8–23)
CO2: 21 mmol/L — ABNORMAL LOW (ref 22–32)
Calcium: 8.7 mg/dL — ABNORMAL LOW (ref 8.9–10.3)
Chloride: 108 mmol/L (ref 98–111)
Creatinine, Ser: 0.8 mg/dL (ref 0.61–1.24)
GFR calc Af Amer: >60 mL/min (ref >60)
GFR calc non Af Amer: >60 mL/min (ref >60)
Glucose, Bld: 294 mg/dL — ABNORMAL HIGH (ref 70–99)
Potassium: 4.2 mmol/L (ref 3.5–5.1)
Sodium: 136 mmol/L (ref 135–145)
Total Bilirubin: 2.7 mg/dL — ABNORMAL HIGH (ref 0.3–1.2)
Total Protein: 6.3 g/dL — ABNORMAL LOW (ref 6.5–8.1)

## 2020-01-28 LAB — GLUCOSE, CAPILLARY
Glucose-Capillary: 174 mg/dL — ABNORMAL HIGH (ref 70–99)
Glucose-Capillary: 144 mg/dL — ABNORMAL HIGH (ref 70–99)

## 2020-01-28 LAB — SARS CORONAVIRUS 2 (TAT 6-24 HRS): SARS Coronavirus 2: NEGATIVE

## 2020-01-28 LAB — CBC WITH DIFFERENTIAL/PLATELET
Abs Immature Granulocytes: 0.03 10*3/uL (ref 0.00–0.07)
Basophils Absolute: 0.1 10*3/uL (ref 0.0–0.1)
Basophils Relative: 1 %
Eosinophils Absolute: 0.3 10*3/uL (ref 0.0–0.5)
Eosinophils Relative: 3 %
HCT: 40.2 % (ref 39.0–52.0)
Hemoglobin: 14.3 g/dL (ref 13.0–17.0)
Immature Granulocytes: 0 %
Lymphocytes Relative: 34 %
Lymphs Abs: 4 10*3/uL (ref 0.7–4.0)
MCH: 35.9 pg — ABNORMAL HIGH (ref 26.0–34.0)
MCHC: 35.6 g/dL (ref 30.0–36.0)
MCV: 101 fL — ABNORMAL HIGH (ref 80.0–100.0)
Monocytes Absolute: 0.9 10*3/uL (ref 0.1–1.0)
Monocytes Relative: 7 %
Neutro Abs: 6.5 10*3/uL (ref 1.7–7.7)
Neutrophils Relative %: 55 %
Platelets: 92 10*3/uL — ABNORMAL LOW (ref 150–400)
RBC: 3.98 MIL/uL — ABNORMAL LOW (ref 4.22–5.81)
RDW: 13.8 % (ref 11.5–15.5)
Smear Review: NORMAL
WBC: 11.9 10*3/uL — ABNORMAL HIGH (ref 4.0–10.5)
nRBC: 0 % (ref 0.0–0.2)

## 2020-01-28 LAB — LACTIC ACID, PLASMA
Lactic Acid, Venous: 2.3 mmol/L (ref 0.5–1.9)
Lactic Acid, Venous: 3.1 mmol/L (ref 0.5–1.9)
Lactic Acid, Venous: 1.4 mmol/L (ref 0.5–1.9)
Lactic Acid, Venous: 1.4 mmol/L (ref 0.5–1.9)

## 2020-01-28 LAB — PROCALCITONIN: Procalcitonin: 0.79 ng/mL

## 2020-01-28 LAB — PROTIME-INR
INR: 1.4 — ABNORMAL HIGH (ref 0.8–1.2)
Prothrombin Time: 16.6 seconds — ABNORMAL HIGH (ref 11.4–15.2)

## 2020-01-28 LAB — C-REACTIVE PROTEIN: CRP: 4.2 mg/dL — ABNORMAL HIGH (ref <1.0)

## 2020-01-28 LAB — SEDIMENTATION RATE: Sed Rate: 10 mm/hr (ref 0–20)

## 2020-01-28 LAB — BRAIN NATRIURETIC PEPTIDE: B Natriuretic Peptide: 63 pg/mL (ref 0.0–100.0)

## 2020-01-28 MED ORDER — SODIUM CHLORIDE 0.9 % IV SOLN: INTRAVENOUS | Status: AC

## 2020-01-28 MED ORDER — HYDRALAZINE HCL 25 MG PO TABS
25.0000 mg | ORAL_TABLET | Freq: Three times a day (TID) | ORAL | Status: DC | PRN
  Administered 2020-01-28: 25 mg via ORAL
  Filled 2020-01-28: qty 1

## 2020-01-28 MED ORDER — INSULIN ASPART 100 UNIT/ML ~~LOC~~ SOLN: 0.0000 [IU] | Freq: Every day | SUBCUTANEOUS | Status: DC

## 2020-01-28 MED ORDER — RAMIPRIL 2.5 MG PO CAPS
2.5000 mg | ORAL_CAPSULE | Freq: Every day | ORAL | Status: DC
  Administered 2020-01-29 – 2020-01-31 (×3): 2.5 mg via ORAL
  Filled 2020-01-28 (×3): qty 1

## 2020-01-28 MED ORDER — OXYCODONE-ACETAMINOPHEN 5-325 MG PO TABS: 1.0000 | ORAL_TABLET | ORAL | Status: DC | PRN

## 2020-01-28 MED ORDER — ASPIRIN EC 81 MG PO TBEC
81.0000 mg | DELAYED_RELEASE_TABLET | Freq: Every day | ORAL | Status: DC
  Administered 2020-01-29 – 2020-01-31 (×3): 81 mg via ORAL
  Filled 2020-01-28 (×3): qty 1

## 2020-01-28 MED ORDER — SODIUM CHLORIDE 0.9 % IV BOLUS
1000.0000 mL | Freq: Once | INTRAVENOUS | Status: AC
  Administered 2020-01-28: 1000 mL via INTRAVENOUS

## 2020-01-28 MED ORDER — INSULIN ASPART 100 UNIT/ML ~~LOC~~ SOLN
0.0000 [IU] | Freq: Three times a day (TID) | SUBCUTANEOUS | Status: DC
  Filled 2020-01-28: qty 1

## 2020-01-28 MED ORDER — LEVOTHYROXINE SODIUM 50 MCG PO TABS
75.0000 ug | ORAL_TABLET | Freq: Every day | ORAL | Status: DC
  Administered 2020-01-29 – 2020-01-31 (×3): 75 ug via ORAL
  Filled 2020-01-28 (×3): qty 2

## 2020-01-28 MED ORDER — ACETAMINOPHEN 325 MG PO TABS
650.0000 mg | ORAL_TABLET | Freq: Four times a day (QID) | ORAL | Status: DC | PRN
  Administered 2020-01-28 – 2020-01-30 (×3): 650 mg via ORAL
  Filled 2020-01-28 (×3): qty 2

## 2020-01-28 MED ORDER — VITAMIN B-12 1000 MCG PO TABS
2000.0000 ug | ORAL_TABLET | Freq: Every day | ORAL | Status: DC
  Administered 2020-01-29 – 2020-01-31 (×3): 2000 ug via ORAL
  Filled 2020-01-28 (×3): qty 2

## 2020-01-28 MED ORDER — HYDRALAZINE HCL 20 MG/ML IJ SOLN: 5.0000 mg | INTRAMUSCULAR | Status: DC | PRN

## 2020-01-28 MED ORDER — FA-PYRIDOXINE-CYANOCOBALAMIN 2.5-25-2 MG PO TABS: 1.0000 | ORAL_TABLET | Freq: Every day | ORAL | Status: DC

## 2020-01-28 MED ORDER — SIMVASTATIN 40 MG PO TABS
40.0000 mg | ORAL_TABLET | Freq: Every day | ORAL | Status: DC
  Administered 2020-01-28 – 2020-01-30 (×3): 40 mg via ORAL
  Filled 2020-01-28 (×2): qty 4
  Filled 2020-01-28: qty 1
  Filled 2020-01-28: qty 4
  Filled 2020-01-28: qty 1

## 2020-01-28 MED ORDER — FOLIC ACID 1 MG PO TABS
2.5000 mg | ORAL_TABLET | Freq: Every day | ORAL | Status: DC
  Administered 2020-01-29 – 2020-01-31 (×3): 2.5 mg via ORAL
  Filled 2020-01-28 (×3): qty 3

## 2020-01-28 MED ORDER — VITAMIN B-12 1000 MCG PO TABS
5000.0000 ug | ORAL_TABLET | Freq: Every day | ORAL | Status: DC
  Administered 2020-01-29 – 2020-01-31 (×3): 5000 ug via ORAL
  Filled 2020-01-28 (×3): qty 5

## 2020-01-28 MED ORDER — SODIUM CHLORIDE 0.9 % IV SOLN
2.0000 g | INTRAVENOUS | Status: DC
  Administered 2020-01-28 – 2020-01-31 (×4): 2 g via INTRAVENOUS
  Filled 2020-01-28 (×3): qty 2
  Filled 2020-01-28: qty 20

## 2020-01-28 MED ORDER — PNEUMOCOCCAL VAC POLYVALENT 25 MCG/0.5ML IJ INJ
0.5000 mL | INJECTION | INTRAMUSCULAR | Status: AC
  Administered 2020-01-30: 0.5 mL via INTRAMUSCULAR
  Filled 2020-01-28: qty 0.5

## 2020-01-28 MED ORDER — VITAMIN B-6 50 MG PO TABS
25.0000 mg | ORAL_TABLET | Freq: Every day | ORAL | Status: DC
  Administered 2020-01-29 – 2020-01-31 (×3): 25 mg via ORAL
  Filled 2020-01-28 (×3): qty 0.5

## 2020-01-28 MED ORDER — ONDANSETRON HCL 4 MG/2ML IJ SOLN: 4.0000 mg | Freq: Three times a day (TID) | INTRAMUSCULAR | Status: DC | PRN

## 2020-01-28 NOTE — H&P (Signed)
History and Physical    Nicholas Abbott XJD:[id number] DOB: 11/04/1950 DOA: 01/28/2020  Referring MD/NP/PA:   PCP: Valera Castle, MD   Patient coming from:  The patient is coming from home.  At baseline, pt is independent for most of ADL.        Chief Complaint: right lower leg pain and fever  HPI: Nicholas Abbott is a 69 y.o. male with medical history significant of hypertension, hyperlipidemia, diabetes mellitus, GERD, CAD, who presents with right lower leg pain and fever.  Pt had hx of right lower leg cellulitis.  Patient was hospitalized from 12/30//20-11/05/2019.  Patient states that after he was treated antibiotics, he initially improved significantly, but feels like it never completely resolved.  In the past several days, he developed worsening right lower leg pain and swelling.  The pain is constant, sharp, 5 out of 10 severity, nonradiating.  He developed fever and chills since yesterday.  He had temperature 102 at one time. Patient does not have chest pain, shortness breath, cough.  No nausea, vomiting, diarrhea, abdominal pain, symptoms of UTI or unilateral weakness.  ED Course: pt was found to have WBC 11.9, pending COVID-19 PCR, lactic acid 2.3, INR 1.4, electrolytes renal function okay, temperature 98.8, blood pressure 134/68, heart rate 59, oxygen saturation 97% on room air.  Review of Systems:   General: has fevers, chills, no body weight gain, has fatigue HEENT: no blurry vision, hearing changes or sore throat Respiratory: no dyspnea, coughing, wheezing CV: no chest pain, no palpitations GI: no nausea, vomiting, abdominal pain, diarrhea, constipation GU: no dysuria, burning on urination, increased urinary frequency, hematuria  Ext: has right lower leg swelling, tenderness Neuro: no unilateral weakness, numbness, or tingling, no vision change or hearing loss Skin: no rash, no skin tear. MSK: No muscle spasm, no deformity, no limitation of range of movement in  spin Heme: No easy bruising.  Travel history: No recent long distant travel.  Allergy:  Allergies  Allergen Reactions  . Pioglitazone Rash    Past Medical History:  Diagnosis Date  . Diabetes mellitus without complication ([place])   . GERD (gastroesophageal reflux disease)   . Hyperlipidemia   . Hypertension   . Left shoulder pain   . Myocardial infarction [facility])     Past Surgical History:  Procedure Laterality Date  . CATARACT EXTRACTION    . COLONOSCOPY WITH PROPOFOL N/A 01/01/2020   Procedure: COLONOSCOPY WITH PROPOFOL;  Surgeon: [name], Benay Pike, MD;  Location: [facility] ENDOSCOPY;  Service: Gastroenterology;  Laterality: N/A;  . CORONARY ANGIOPLASTY    . ESOPHAGOGASTRODUODENOSCOPY (EGD) WITH PROPOFOL N/A 01/01/2020   Procedure: ESOPHAGOGASTRODUODENOSCOPY (EGD) WITH PROPOFOL;  Surgeon: [name], Benay Pike, MD;  Location: [facility] ENDOSCOPY;  Service: Gastroenterology;  Laterality: N/A;  . EYE SURGERY    . RETINAL DETACHMENT SURGERY      Social History:  reports that he has never smoked. He has never used smokeless tobacco. He reports current alcohol use of about 5.0 standard drinks of alcohol per week. He reports that he does not use drugs.  Family History:  Family History  Problem Relation Age of Onset  . Leukemia Father      Prior to Admission medications   Medication Sig Start Date End Date Taking? Authorizing Provider  folic acid-pyridoxine-cyancobalamin (FOLBIC) 2.5-25-2 MG TABS tablet Take 1 tablet by mouth daily. 02/08/17   [provider]  furosemide (LASIX) 40 MG tablet Take 40 mg by mouth daily. 09/15/19   [provider]  glipiZIDE (GLUCOTROL XL) 10 MG 24 hr tablet Take 10 mg by mouth 2 (two) times daily. 08/08/19   [provider]  levothyroxine (SYNTHROID) 50 MCG tablet Take 50 mcg by mouth every morning.  10/03/19   [provider]  metFORMIN (GLUCOPHAGE-XR) 500 MG 24 hr tablet Take 545m BID or whatever pt was taking at home 11/05/19    WWyvonnia Dusky MD  Nutritional Supplements (ANTIOXIDANT WITH CO Q-10 PO) Take 1 tablet by mouth daily.    [provider]  ramipril (ALTACE) 2.5 MG capsule Take 2.5 mg by mouth daily. 10/03/19   [provider]  sildenafil (REVATIO) 20 MG tablet Take 1 tablet by mouth daily.  11/16/16   [provider]  simvastatin (ZOCOR) 40 MG tablet Take 1 tablet by mouth daily at 6 PM.  01/30/16   [provider]  traMADol (ULTRAM) 50 MG tablet Take by mouth every 6 (six) hours as needed.    [provider]  vitamin B-12 (CYANOCOBALAMIN) 1000 MCG tablet Take 1,000 mcg by mouth daily.    [provider]    Physical Exam: Vitals:   01/28/20 1148 01/28/20 1150 01/28/20 1320 01/28/20 1558  BP: (!) 154/78  134/68   Pulse: 65  (!) 59 60  Resp: '18  16 16  ' Temp: 98.5 F (36.9 C)  98.8 F (37.1 C)   TempSrc: Oral  Oral   SpO2: 97%  99% 98%  Weight:  127 kg    Height:  '6\' 2"'  (1.88 m)     General: Not in acute distress HEENT:       Eyes: PERRL, EOMI, no scleral icterus.       ENT: No discharge from the ears and nose, no pharynx injection, no tonsillar enlargement.        Neck: No JVD, no bruit, no mass felt. Heme: No neck lymph node enlargement. Cardiac: S1/S2, RRR, No murmurs, No gallops or rubs. Respiratory: No rales, wheezing, rhonchi or rubs. GI: Soft, nondistended, nontender, no rebound pain, no organomegaly, BS present. GU: No hematuria Ext: 1+DP/PT pulse bilaterally.  Has swelling, tenderness, warmth, erythema in right lower leg from ankle to knee Musculoskeletal: No joint deformities, No joint redness or warmth, no limitation of ROM in spin. Skin: No rashes.  Neuro: Alert, oriented X3, cranial nerves II-XII grossly intact, moves all extremities normally.  Psych: Patient is not psychotic, no suicidal or hemocidal ideation.  Labs on Admission: I have personally reviewed following labs and imaging studies  CBC: Recent Labs  Lab  01/28/20 1202  WBC 11.9*  NEUTROABS 6.5  HGB 14.3  HCT 40.2  MCV 101.0*  PLT 92*   Basic Metabolic Panel: Recent Labs  Lab 01/28/20 1202  NA 136  K 4.2  CL 108  CO2 21*  GLUCOSE 294*  BUN 24*  CREATININE 0.80  CALCIUM 8.7*   GFR: Estimated Creatinine Clearance: 121.6 mL/min (by C-G formula based on SCr of 0.8 mg/dL). Liver Function Tests: Recent Labs  Lab 01/28/20 1202  AST 67*  ALT 63*  ALKPHOS 66  BILITOT 2.7*  PROT 6.3*  ALBUMIN 3.2*   No results for input(s): LIPASE, AMYLASE in the last 168 hours. No results for input(s): AMMONIA in the last 168 hours. Coagulation Profile: Recent Labs  Lab 01/28/20 1202  INR 1.4*   Cardiac Enzymes: No results for input(s): CKTOTAL, CKMB, CKMBINDEX, TROPONINI in the last 168 hours. BNP (last 3 results) No results for input(s): PROBNP in the last 8760 hours.  HbA1C: No results for input(s): HGBA1C in the last 72 hours. CBG: No results for input(s): GLUCAP in the last 168 hours. Lipid Profile: No results for input(s): CHOL, HDL, LDLCALC, TRIG, CHOLHDL, LDLDIRECT in the last 72 hours. Thyroid Function Tests: No results for input(s): TSH, T4TOTAL, FREET4, T3FREE, THYROIDAB in the last 72 hours. Anemia Panel: No results for input(s): VITAMINB12, FOLATE, FERRITIN, TIBC, IRON, RETICCTPCT in the last 72 hours. Urine analysis:    Component Value Date/Time   COLORURINE YELLOW (A) 11/02/2019 0956   APPEARANCEUR HAZY (A) 11/02/2019 0956   LABSPEC 1.012 11/02/2019 0956   PHURINE 6.0 11/02/2019 0956   GLUCOSEU 50 (A) 11/02/2019 0956   HGBUR LARGE (A) 11/02/2019 0956   BILIRUBINUR NEGATIVE 11/02/2019 0956   KETONESUR NEGATIVE 11/02/2019 0956   PROTEINUR NEGATIVE 11/02/2019 0956   NITRITE NEGATIVE 11/02/2019 0956   LEUKOCYTESUR NEGATIVE 11/02/2019 0956   Sepsis Labs: '@LABRCNTIP' (procalcitonin:4,lacticidven:4) )No results found for this or any previous visit (from the past 240 hour(s)).   Radiological Exams on  Admission: No results found.   EKG: Not done in ED, will get one.   Assessment/Plan Principal Problem:   Cellulitis of right lower extremity Active Problems:   Hyperlipidemia   Hypertension   Diabetes mellitus without complication (HCC)   CAD (coronary artery disease)   Thrombocytopenia (HCC)   Cellulitis of right leg   Cellulitis of right lower extremity: Patient dose not meet criteria for sepsis, but has elevated lactic acid of 2.3, WBC 11.9, patient is at high risk of developing sepsis.  Currently hemodynamically stable.  This is a recurrent issue.  - will place on med-surg bed for obs - Empiric antimicrobial treatment with Rocephin - PRN Zofran for nausea, Percocet for pain - Blood cultures x 2  - ESR and CRP - LE doppler to r/o DVT - IVF: 2.0L of NS bolus in ED, followed by 75 cc/h  Hyperlipidemia: -zocor  HTN:  -Continue home ramipril -hydralazine prn  Diabetes mellitus without complication ([place]): Last A1c 5.5 on 09/05/19, well controled. Patient is taking glipizide, Metformin at home. - -SSI  CAD (coronary artery disease): no CP -on zocor and ASA  Thrombocytopenia ([place]): This is a chronic issue.  Platelet 92.  Etiology is not clear. -f/u by CBC   DVT ppx: SCD Code Status: Full code Family Communication: not done, no family member is at bed side.    Disposition Plan:  Anticipate discharge back to previous home environment Consults called:  none Admission status: Med-surg bed for obs    Date of Service 01/28/2020    [facility]   If 7PM-7AM, please contact night-coverage [email or web address] 01/28/2020, 4:39 PM

## 2020-01-28 NOTE — ED Notes (Signed)
First Nurse Note: Pt to ED via POV for pain and swelling in his right knee. Hx/o Cellulitis a few months ago. Pt is in NAD.

## 2020-01-28 NOTE — ED Notes (Signed)
Pt notified of admission status

## 2020-01-28 NOTE — ED Triage Notes (Addendum)
Pt arrived via POV with reports of right lower leg cellulitis. Pt states he had temp of 102 yesterday at home and was able to take tylenol to decrease temp.  Pt states he was admittted with cellulitis to right knee in January.  Pt states he noticed the cellulitis in lower leg yesterday. Pt states the redness started in the ankle and streaking up towards knee.

## 2020-01-28 NOTE — ED Notes (Addendum)
Pt's right eyelid appears swollen, pt denies discomfort. Pt states he only takes metformin for diabetes.

## 2020-01-28 NOTE — ED Notes (Signed)
Report given to inpatient RN, transport requested.

## 2020-01-28 NOTE — ED Provider Notes (Signed)
[facility] Emergency Department Provider Note  ____________________________________________   First MD Initiated Contact with Patient 01/28/20 1206     (approximate)  I have reviewed the triage vital signs and the nursing notes.   HISTORY  Chief Complaint Cellulitis    HPI Nicholas Abbott is a 69 y.o. male with diabetes, prior R knee cellulitis unclear cause in Jan who on review of records was getting better on ancef and dc on 5 days keflex who comes in for R leg new cellulitis.  Cellulitis in Jan resolved but continued to have low grade temps. Then yesterday had temps of 102 by ear thermo. And noted new rash on the R ankle and streaking up the leg almost to the knee, constant, has not taking anything for it, nothing makes it worse.  Does have swelling.  Denies prior surgery in leg. When he had similar admission DVT US was negative.          Past Medical History:  Diagnosis Date  . Diabetes mellitus without complication ([place])   . GERD (gastroesophageal reflux disease)   . Hyperlipidemia   . Hypertension   . Left shoulder pain   . Myocardial infarction [facility])     Patient Active Problem List   Diagnosis Date Noted  . Cellulitis 11/03/2019  . Hyperlipidemia   . Hypertension   . Diabetes mellitus without complication ([place])   . CAD (coronary artery disease)   . Thrombocytopenia ([place])   . Acute metabolic encephalopathy   . Hypoglycemia   . Sepsis ([place])   . Cellulitis of right lower extremity     Past Surgical History:  Procedure Laterality Date  . CATARACT EXTRACTION    . COLONOSCOPY WITH PROPOFOL N/A 01/01/2020   Procedure: COLONOSCOPY WITH PROPOFOL;  Surgeon: [name], Benay Pike, MD;  Location: [facility] ENDOSCOPY;  Service: Gastroenterology;  Laterality: N/A;  . CORONARY ANGIOPLASTY    . ESOPHAGOGASTRODUODENOSCOPY (EGD) WITH PROPOFOL N/A 01/01/2020   Procedure: ESOPHAGOGASTRODUODENOSCOPY (EGD) WITH PROPOFOL;  Surgeon: [name], Benay Pike, MD;   Location: [facility] ENDOSCOPY;  Service: Gastroenterology;  Laterality: N/A;  . EYE SURGERY    . RETINAL DETACHMENT SURGERY      Prior to Admission medications   Medication Sig Start Date End Date Taking? Authorizing Provider  folic acid-pyridoxine-cyancobalamin (FOLBIC) 2.5-25-2 MG TABS tablet Take 1 tablet by mouth daily. 02/08/17   [provider]  furosemide (LASIX) 40 MG tablet Take 40 mg by mouth daily. 09/15/19   [provider]  glipiZIDE (GLUCOTROL XL) 10 MG 24 hr tablet Take 10 mg by mouth 2 (two) times daily. 08/08/19   [provider]  levothyroxine (SYNTHROID) 50 MCG tablet Take 50 mcg by mouth every morning.  10/03/19   [provider]  metFORMIN (GLUCOPHAGE-XR) 500 MG 24 hr tablet Take 500mg  BID or whatever pt was taking at home 11/05/19   Wyvonnia Dusky, MD  Nutritional Supplements (ANTIOXIDANT WITH CO Q-10 PO) Take 1 tablet by mouth daily.    [provider]  ramipril (ALTACE) 2.5 MG capsule Take 2.5 mg by mouth daily. 10/03/19   [provider]  sildenafil (REVATIO) 20 MG tablet Take 1 tablet by mouth daily.  11/16/16   [provider]  simvastatin (ZOCOR) 40 MG tablet Take 1 tablet by mouth daily at 6 PM.  01/30/16   [provider]  traMADol (ULTRAM) 50 MG tablet Take by mouth every 6 (six) hours as needed.    [provider]  vitamin B-12 (  CYANOCOBALAMIN) 1000 MCG tablet Take 1,000 mcg by mouth daily.    [provider]    Allergies Pioglitazone  Family History  Problem Relation Age of Onset  . Leukemia Father     Social History Social History   Tobacco Use  . Smoking status: Never Smoker  . Smokeless tobacco: Never Used  Substance Use Topics  . Alcohol use: Yes    Alcohol/week: 5.0 standard drinks    Types: 5 Shots of liquor per week  . Drug use: Never      Review of Systems Constitutional: +fevers Eyes: No visual changes. ENT: No sore throat. Cardiovascular: Denies  chest pain. Respiratory: Denies shortness of breath. Gastrointestinal: No abdominal pain.  No nausea, no vomiting.  No diarrhea.  No constipation. Genitourinary: Negative for dysuria. Musculoskeletal: Negative for back pain. + Left leg swelling Skin: +rash Neurological: Negative for headaches, focal weakness or numbness. All other ROS negative ____________________________________________   PHYSICAL EXAM:  VITAL SIGNS: ED Triage Vitals  Enc Vitals Group     BP 01/28/20 1148 (!) 154/78     Pulse Rate 01/28/20 1148 65     Resp 01/28/20 1148 18     Temp 01/28/20 1148 98.5 F (36.9 C)     Temp Source 01/28/20 1148 Oral     SpO2 01/28/20 1148 97 %     Weight 01/28/20 1150 280 lb (127 kg)     Height 01/28/20 1150 6\' 2"  (1.88 m)     Head Circumference --      Peak Flow --      Pain Score 01/28/20 1150 5     Pain Loc --      Pain Edu? --      Excl. in [place]? --     Constitutional: Alert and oriented. Well appearing and in no acute distress. Eyes: Conjunctivae are normal. EOMI. Head: Atraumatic. Nose: No congestion/rhinnorhea. Mouth/Throat: Mucous membranes are moist.   Neck: No stridor. Trachea Midline. FROM Cardiovascular: Normal rate, regular rhythm. Grossly normal heart sounds.  Good peripheral circulation. Respiratory: Normal respiratory effort.  No retractions. Lungs CTAB. Gastrointestinal: Soft and nontender. No distention. No abdominal bruits.  Musculoskeletal: 1+ edema the right leg with warmth and erythem in ankle going up leg. Does not hit knee. Full ROM of ankle without pain.  Neurologic:  Normal speech and language. No gross focal neurologic deficits are appreciated.  Skin:  Skin is warm, dry and intact. rash noted. Psychiatric: Mood and affect are normal. Speech and behavior are normal. GU: Deferred   ____________________________________________   LABS (all labs ordered are listed, but only abnormal results are displayed)  Labs Reviewed  COMPREHENSIVE METABOLIC  PANEL - Abnormal; Notable for the following components:      Result Value   CO2 21 (*)    Glucose, Bld 294 (*)    BUN 24 (*)    Calcium 8.7 (*)    Total Protein 6.3 (*)    Albumin 3.2 (*)    AST 67 (*)    ALT 63 (*)    Total Bilirubin 2.7 (*)    All other components within normal limits  LACTIC ACID, PLASMA - Abnormal; Notable for the following components:   Lactic Acid, Venous 2.3 (*)    All other components within normal limits  LACTIC ACID, PLASMA - Abnormal; Notable for the following components:   Lactic Acid, Venous 3.1 (*)    All other components within normal limits  CBC WITH DIFFERENTIAL/PLATELET - Abnormal; Notable for the  following components:   WBC 11.9 (*)    RBC 3.98 (*)    MCV 101.0 (*)    MCH 35.9 (*)    Platelets 92 (*)    All other components within normal limits  PROTIME-INR - Abnormal; Notable for the following components:   Prothrombin Time 16.6 (*)    INR 1.4 (*)    All other components within normal limits  CULTURE, BLOOD (ROUTINE X 2)  CULTURE, BLOOD (ROUTINE X 2)  SARS CORONAVIRUS 2 (TAT 6-24 HRS)  SEDIMENTATION RATE  PROCALCITONIN  BRAIN NATRIURETIC PEPTIDE  PROCALCITONIN  BASIC METABOLIC PANEL  CBC  C-REACTIVE PROTEIN  LACTIC ACID, PLASMA  LACTIC ACID, PLASMA   ____________________________________________  PROCEDURES  Procedure(s) performed (including Critical Care):  Procedures   ____________________________________________   INITIAL IMPRESSION / ASSESSMENT AND PLAN / ED COURSE  Tammi Klippel was evaluated in Emergency Department on 01/28/2020 for the symptoms described in the history of present illness. He was evaluated in the context of the global COVID-19 pandemic, which necessitated consideration that the patient might be at risk for infection with the SARS-CoV-2 virus that causes COVID-19. Institutional protocols and algorithms that pertain to the evaluation of patients at risk for COVID-19 are in a state of rapid change based  on information released by regulatory bodies including the CDC and federal and state organizations. These policies and algorithms were followed during the patient's care in the ED.    New cellulitis in same leg. Will get labs to evaluate for bactermia, aki, dehydration> NO signs of septic joint. Prior DVT US negative and this looks exactly like last time so dont think repeating is necessary.  Unclear why he is having recurrent infections.  Labs show elevted white count/lactate. Will give some fluid and start on ceftriaxone.  Pt hyperglycemic. Given extent of cellulitis will dw hospital team for admission.   Admit.       ____________________________________________   FINAL CLINICAL IMPRESSION(S) / ED DIAGNOSES   Final diagnoses:  Cellulitis of leg, right  Hyperglycemia      MEDICATIONS GIVEN DURING THIS VISIT:  Medications  sodium chloride 0.9 % bolus 1,000 mL (has no administration in time range)  cefTRIAXone (ROCEPHIN) 2 g in sodium chloride 0.9 % 100 mL IVPB (has no administration in time range)     ED Discharge Orders    None       Note:  This document was prepared using Dragon voice recognition software and may include unintentional dictation errors.   Vanessa La Minita, MD 01/28/20 [phone]

## 2020-01-29 ENCOUNTER — Observation Stay: Payer: Medicare Other

## 2020-01-29 ENCOUNTER — Inpatient Hospital Stay: Payer: Medicare Other

## 2020-01-29 DIAGNOSIS — E1165 Type 2 diabetes mellitus with hyperglycemia: Secondary | ICD-10-CM | POA: Diagnosis present

## 2020-01-29 DIAGNOSIS — Z6835 Body mass index (BMI) 35.0-35.9, adult: Secondary | ICD-10-CM | POA: Diagnosis not present

## 2020-01-29 DIAGNOSIS — I10 Essential (primary) hypertension: Secondary | ICD-10-CM | POA: Diagnosis present

## 2020-01-29 DIAGNOSIS — Z23 Encounter for immunization: Secondary | ICD-10-CM | POA: Diagnosis not present

## 2020-01-29 DIAGNOSIS — Z79899 Other long term (current) drug therapy: Secondary | ICD-10-CM | POA: Diagnosis not present

## 2020-01-29 DIAGNOSIS — Z888 Allergy status to other drugs, medicaments and biological substances status: Secondary | ICD-10-CM | POA: Diagnosis not present

## 2020-01-29 DIAGNOSIS — I2510 Atherosclerotic heart disease of native coronary artery without angina pectoris: Secondary | ICD-10-CM | POA: Diagnosis present

## 2020-01-29 DIAGNOSIS — D696 Thrombocytopenia, unspecified: Secondary | ICD-10-CM | POA: Diagnosis present

## 2020-01-29 DIAGNOSIS — K219 Gastro-esophageal reflux disease without esophagitis: Secondary | ICD-10-CM | POA: Diagnosis present

## 2020-01-29 DIAGNOSIS — I252 Old myocardial infarction: Secondary | ICD-10-CM | POA: Diagnosis not present

## 2020-01-29 DIAGNOSIS — Z20822 Contact with and (suspected) exposure to covid-19: Secondary | ICD-10-CM | POA: Diagnosis present

## 2020-01-29 DIAGNOSIS — E669 Obesity, unspecified: Secondary | ICD-10-CM | POA: Diagnosis present

## 2020-01-29 DIAGNOSIS — R7989 Other specified abnormal findings of blood chemistry: Secondary | ICD-10-CM | POA: Diagnosis present

## 2020-01-29 DIAGNOSIS — L03115 Cellulitis of right lower limb: Secondary | ICD-10-CM | POA: Diagnosis not present

## 2020-01-29 DIAGNOSIS — Z79890 Hormone replacement therapy (postmenopausal): Secondary | ICD-10-CM | POA: Diagnosis not present

## 2020-01-29 DIAGNOSIS — Z806 Family history of leukemia: Secondary | ICD-10-CM | POA: Diagnosis not present

## 2020-01-29 DIAGNOSIS — E785 Hyperlipidemia, unspecified: Secondary | ICD-10-CM | POA: Diagnosis present

## 2020-01-29 LAB — CBC
HCT: 38.1 % — ABNORMAL LOW (ref 39.0–52.0)
Hemoglobin: 13.2 g/dL (ref 13.0–17.0)
MCH: 36 pg — ABNORMAL HIGH (ref 26.0–34.0)
MCHC: 34.6 g/dL (ref 30.0–36.0)
MCV: 103.8 fL — ABNORMAL HIGH (ref 80.0–100.0)
Platelets: 88 10*3/uL — ABNORMAL LOW (ref 150–400)
RBC: 3.67 MIL/uL — ABNORMAL LOW (ref 4.22–5.81)
RDW: 13.7 % (ref 11.5–15.5)
WBC: 10.8 10*3/uL — ABNORMAL HIGH (ref 4.0–10.5)
nRBC: 0 % (ref 0.0–0.2)

## 2020-01-29 LAB — BASIC METABOLIC PANEL
Anion gap: 6 (ref 5–15)
BUN: 19 mg/dL (ref 8–23)
CO2: 22 mmol/L (ref 22–32)
Calcium: 8 mg/dL — ABNORMAL LOW (ref 8.9–10.3)
Chloride: 111 mmol/L (ref 98–111)
Creatinine, Ser: 0.78 mg/dL (ref 0.61–1.24)
GFR calc Af Amer: >60 mL/min (ref >60)
GFR calc non Af Amer: >60 mL/min (ref >60)
Glucose, Bld: 152 mg/dL — ABNORMAL HIGH (ref 70–99)
Potassium: 4.1 mmol/L (ref 3.5–5.1)
Sodium: 139 mmol/L (ref 135–145)

## 2020-01-29 LAB — GLUCOSE, CAPILLARY
Glucose-Capillary: 124 mg/dL — ABNORMAL HIGH (ref 70–99)
Glucose-Capillary: 222 mg/dL — ABNORMAL HIGH (ref 70–99)
Glucose-Capillary: 200 mg/dL — ABNORMAL HIGH (ref 70–99)
Glucose-Capillary: 201 mg/dL — ABNORMAL HIGH (ref 70–99)

## 2020-01-29 LAB — URINALYSIS, COMPLETE (UACMP) WITH MICROSCOPIC
Bacteria, UA: NONE SEEN
Bilirubin Urine: NEGATIVE
Glucose, UA: 50 mg/dL — AB
Ketones, ur: NEGATIVE mg/dL
Leukocytes,Ua: NEGATIVE
Nitrite: NEGATIVE
Protein, ur: NEGATIVE mg/dL
Specific Gravity, Urine: 1.014 (ref 1.005–1.030)
pH: 6 (ref 5.0–8.0)

## 2020-01-29 LAB — PROCALCITONIN: Procalcitonin: 0.58 ng/mL

## 2020-01-29 MED ORDER — GADOBUTROL 1 MMOL/ML IV SOLN
10.0000 mL | Freq: Once | INTRAVENOUS | Status: AC | PRN
  Administered 2020-01-29: 10 mL via INTRAVENOUS

## 2020-01-29 MED ORDER — ENOXAPARIN SODIUM 40 MG/0.4ML ~~LOC~~ SOLN
40.0000 mg | SUBCUTANEOUS | Status: DC
  Administered 2020-01-29 – 2020-01-30 (×2): 40 mg via SUBCUTANEOUS
  Filled 2020-01-29 (×2): qty 0.4

## 2020-01-29 NOTE — Progress Notes (Addendum)
PROGRESS NOTE    Nicholas Abbott  [id number] DOB: 1951/10/06 DOA: 01/28/2020 PCP: Valera Castle, MD   Brief Narrative:  HPI: Nicholas Abbott is a 69 y.o. male with medical history significant of hypertension, hyperlipidemia, diabetes mellitus, GERD, CAD, who presents with right lower leg pain and fever.  Pt had hx of right lower leg cellulitis.  Patient was hospitalized from 12/30//20-11/05/2019.  Patient states that after he was treated antibiotics, he initially improved significantly, but feels like it never completely resolved.  In the past several days, he developed worsening right lower leg pain and swelling.  The pain is constant, sharp, 5 out of 10 severity, nonradiating.  He developed fever and chills since yesterday.  He had temperature 102 at one time. Patient does not have chest pain, shortness breath, cough.  No nausea, vomiting, diarrhea, abdominal pain, symptoms of UTI or unilateral weakness.  3/29: Patient seen and examined.  Return from right lower extremity ultrasound.  Ultrasound negative for VTE.  Still with significant significant swelling of the right lower extremity and associated cellulitic change on the medial aspect of the right ankle.  Upon further interview patient states that he feels his infection never entirely resolved after his initial hospitalization in late December 2020.  He also endorses low-grade elevated temperatures at home.  Assessment & Plan:   Principal Problem:   Cellulitis of right lower extremity Active Problems:   Hyperlipidemia   Hypertension   Diabetes mellitus without complication (HCC)   CAD (coronary artery disease)   Thrombocytopenia (HCC)   Cellulitis of right leg  Cellulitis of right lower extremity  Patient dose not meet criteria for sepsis, but has elevated lactic acid of 2.3,  WBC 11.9, patient is at high risk of developing sepsis.   Currently hemodynamically stable.  This is a recurrent issue. Patient appears to be  responding to IV Rocephin however the etiology of his recurrent cellulitis is unknown Per the patient's wife the patient has been having low-grade temperatures every night for the past 3 months She has previously addressed this with outpatient physicians but thus far the work-up has been initiated Right lower extremity negative for VTE Plan: Continue Rocephin for now MRI right ankle with and without contrast ordered.  Concern for possible osteomyelitis As needed pain control As needed nausea control Follow-up blood cultures, no growth to date Supplemental IV fluids for now, stop tonight  Hyperlipidemia: -zocor  HTN:  -Continue homeramipril -hydralazine prn  Diabetes mellitus without complication (HCC):Last [id number] on 09/05/19, well controled. Patient is takingglipizide, Metforminat home. - -SSI  CAD (coronary artery disease): no CP -on zocor and ASA  Thrombocytopenia ([place]):  This is a chronic issue.  Platelet 92.   Etiology is not clear. 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  Currently receiving IV antibiotics.  MRI ordered and pending for further investigation of deep tissue infection..  Consultants:   none  Procedures:   none  Antimicrobials:   Ceftriaxone (01/28/2020-  )   Subjective: Patient seen and examined Area of cellulitic change improving No other complaints  Objective: Vitals:   01/28/20 1814 01/28/20 2023 01/28/20 2059 01/29/20 0448  BP: (!) 183/90 (!) 159/80 Marland Kitchen)  155/84 (!) 126/56  Pulse: 64 (!) 58 63 (!) 57  Resp: 20  18 20   Temp: 97.7 F  (36.5 C)  99.1 F (37.3 C) 98 F (36.7 C)  TempSrc: Oral  Oral Oral  SpO2: 98%  99% 98%  Weight:      Height:        Intake/Output Summary (Last 24 hours) at 01/29/2020 1135 Last data filed at 01/29/2020 1124 Gross per 24 hour  Intake 3795 ml  Output --  Net 3795 ml   Filed Weights   01/28/20 1150  Weight: 127 kg    Examination:  General exam: Appears calm and comfortable  Respiratory system: Clear to auscultation. Respiratory effort normal. Cardiovascular system: S1 & S2 heard, RRR. No JVD, murmurs, rubs, gallops or clicks. No pedal edema. Gastrointestinal system: Obese, abdomen is nondistended, soft and nontender. No organomegaly or masses felt. Normal bowel sounds heard. Central nervous system: Alert and oriented. No focal neurological deficits. Extremities: Symmetric 5 x 5 power. Skin: Erythematous changes medial aspect of right ankle.  Right lower extremity swollen and tender to palpation Psychiatry: Judgement and insight appear normal. Mood & affect appropriate.     Data Reviewed: I have personally reviewed following labs and imaging studies  CBC: Recent Labs  Lab 01/28/20 1202 01/29/20 0501  WBC 11.9* 10.8*  NEUTROABS 6.5  --   HGB 14.3 13.2  HCT 40.2 38.1*  MCV 101.0* 103.8*  PLT 92* 88*   Basic Metabolic Panel: Recent Labs  Lab 01/28/20 1202 01/29/20 0501  NA 136 139  K 4.2 4.1  CL 108 111  CO2 21* 22  GLUCOSE 294* 152*  BUN 24* 19  CREATININE 0.80 0.78  CALCIUM 8.7* 8.0*   GFR: Estimated Creatinine Clearance: 121.6 mL/min (by C-G formula based on SCr of 0.78 mg/dL). Liver Function Tests: Recent Labs  Lab 01/28/20 1202  AST 67*  ALT 63*  ALKPHOS 66  BILITOT 2.7*  PROT 6.3*  ALBUMIN 3.2*   No results for input(s): LIPASE, AMYLASE in the last 168 hours. No results for input(s): AMMONIA in the last 168 hours. Coagulation Profile: Recent Labs  Lab 01/28/20 1202  INR 1.4*   Cardiac Enzymes: No results for input(s): CKTOTAL, CKMB,  CKMBINDEX, TROPONINI in the last 168 hours. BNP (last 3 results) No results for input(s): PROBNP in the last 8760 hours. HbA1C: No results for input(s): HGBA1C in the last 72 hours. CBG: Recent Labs  Lab 01/28/20 1804 01/28/20 2215 01/29/20 0810  GLUCAP 174* 144* 124*   Lipid Profile: No results for input(s): CHOL, HDL, LDLCALC, TRIG, CHOLHDL, LDLDIRECT in the last 72 hours. Thyroid Function Tests: No results for input(s): TSH, T4TOTAL, FREET4, T3FREE, THYROIDAB in the last 72 hours. Anemia Panel: No results for input(s): VITAMINB12, FOLATE, FERRITIN, TIBC, IRON, RETICCTPCT in the last 72 hours. Sepsis Labs: Recent Labs  Lab 01/28/20 1202 01/28/20 1400 01/28/20 1743 01/28/20 1945 01/29/20 0501  PROCALCITON 0.79  --   --   --  0.58  LATICACIDVEN 2.3* 3.1* 1.4 1.4  --     Recent Results (from the past 240 hour(s))  Culture, blood (Routine x 2)     Status: None (Preliminary result)   Collection Time: 01/28/20 11:55 AM   Specimen: BLOOD  Result Value Ref Range Status   Specimen Description BLOOD BLOOD RIGHT FOREARM  Final   Special Requests   Final    BOTTLES DRAWN AEROBIC AND ANAEROBIC Blood Culture adequate volume   Culture   Final  NO GROWTH < 24 HOURS Performed at [facility], [address] [place], [place] [address]    Report Status PENDING  Incomplete  Culture, blood (Routine x 2)     Status: None (Preliminary result)   Collection Time: 01/28/20  2:00 PM   Specimen: BLOOD  Result Value Ref Range Status   Specimen Description BLOOD BLOOD LEFT HAND  Final   Special Requests   Final    BOTTLES DRAWN AEROBIC AND ANAEROBIC Blood Culture results may not be optimal due to an inadequate volume of blood received in culture bottles   Culture   Final    NO GROWTH < 24 HOURS Performed at [facility], [address], [place], [place] [address]    Report Status PENDING  Incomplete  SARS CORONAVIRUS 2 (TAT 6-24 HRS) Nasopharyngeal Nasopharyngeal  Swab     Status: None   Collection Time: 01/28/20  2:00 PM   Specimen: Nasopharyngeal Swab  Result Value Ref Range Status   SARS Coronavirus 2 NEGATIVE NEGATIVE Final    Comment: (NOTE) SARS-CoV-2 target nucleic acids are NOT DETECTED. The SARS-CoV-2 RNA is generally detectable in upper and lower respiratory specimens during the acute phase of infection. Negative results do not preclude SARS-CoV-2 infection, do not rule out co-infections with other pathogens, and should not be used as the sole basis for treatment or other patient management decisions. Negative results must be combined with clinical observations, patient history, and epidemiological information. The expected result is Negative. Fact Sheet for Patients: SugarRoll.be Fact Sheet for Healthcare Providers: https://www.woods-mathews.com/ This test is not yet approved or cleared by the [place] FDA and  has been authorized for detection and/or diagnosis of SARS-CoV-2 by FDA under an Emergency Use Authorization (EUA). This EUA will remain  in effect (meaning this test can be used) for the duration of the COVID-19 declaration under Section 56 4(b)(1) of the Act, 21 U.S.C. section 360bbb-3(b)(1), unless the authorization is terminated or revoked sooner. Performed at [facility], [place] [address] [place],  [address]          Radiology Studies: US Venous Img Lower Unilateral Right (DVT)  Result Date: 01/29/2020 CLINICAL DATA:  69 year old with right leg cellulitis and pain. EXAM: RIGHT LOWER EXTREMITY VENOUS DOPPLER ULTRASOUND TECHNIQUE: Gray-scale sonography with graded compression, as well as color Doppler and duplex ultrasound were performed to evaluate the lower extremity deep venous systems from the level of the common femoral vein and including the common femoral, femoral, profunda femoral, popliteal and calf veins including the posterior tibial, peroneal and  gastrocnemius veins when visible. The superficial great saphenous vein was also interrogated. Spectral Doppler was utilized to evaluate flow at rest and with distal augmentation maneuvers in the common femoral, femoral and popliteal veins. COMPARISON:  11/02/2019 FINDINGS: Contralateral Common Femoral Vein: Respiratory phasicity is normal and symmetric with the symptomatic side. No evidence of thrombus. Normal compressibility. Common Femoral Vein: No evidence of thrombus. Normal compressibility, respiratory phasicity and response to augmentation. Saphenofemoral Junction: No evidence of thrombus. Normal compressibility and flow on color Doppler imaging. Profunda Femoral Vein: No evidence of thrombus. Normal compressibility and flow on color Doppler imaging. Femoral Vein: No evidence of thrombus. Normal compressibility, respiratory phasicity and response to augmentation. Popliteal Vein: No evidence of thrombus. Normal compressibility, respiratory phasicity and response to augmentation. Calf Veins: No evidence of thrombus. Normal compressibility and flow on color Doppler imaging. Other Findings: Prominent lymph node in the right groin with a normal fatty hilum.  This lymph node measures 1.3 cm in the short axis and likely reactive based on the history of cellulitis. IMPRESSION: Negative for deep venous thrombosis in right lower extremity. Electronically Signed   By: Markus Daft M.D.   On: 01/29/2020 09:50        Scheduled Meds: . aspirin EC  81 mg Oral Daily  . folic acid  2.5 mg Oral Daily   And  . vitamin B-6  25 mg Oral Daily   And  . vitamin B-12  2,000 mcg Oral Daily  . insulin aspart  0-5 Units Subcutaneous QHS  . insulin aspart  0-9 Units Subcutaneous TID WC  . levothyroxine  75 mcg Oral Q0600  . pneumococcal 23 valent vaccine  0.5 mL Intramuscular Tomorrow-1000  . ramipril  2.5 mg Oral Daily  . simvastatin  40 mg Oral QHS  . vitamin B-12  5,000 mcg Oral Daily   Continuous Infusions: .  sodium chloride 75 mL/hr at 01/29/20 1124  . cefTRIAXone (ROCEPHIN)  IV Stopped (01/28/20 1503)     LOS: 0 days    Time spent: 35 minutes    Sidney Ace, MD [facility] Pager [phone]  If 7PM-7AM, please contact night-coverage 01/29/2020, 11:35 AM

## 2020-01-30 LAB — PATHOLOGIST SMEAR REVIEW

## 2020-01-30 LAB — GLUCOSE, CAPILLARY
Glucose-Capillary: 210 mg/dL — ABNORMAL HIGH (ref 70–99)
Glucose-Capillary: 162 mg/dL — ABNORMAL HIGH (ref 70–99)
Glucose-Capillary: 222 mg/dL — ABNORMAL HIGH (ref 70–99)
Glucose-Capillary: 171 mg/dL — ABNORMAL HIGH (ref 70–99)

## 2020-01-30 LAB — PROCALCITONIN: Procalcitonin: 0.34 ng/mL

## 2020-01-30 MED ORDER — SODIUM CHLORIDE 0.9 % IV SOLN
INTRAVENOUS | Status: DC | PRN
  Administered 2020-01-30: 1000 mL via INTRAVENOUS
  Administered 2020-01-31: 250 mL via INTRAVENOUS

## 2020-01-30 MED ORDER — KETOROLAC TROMETHAMINE 15 MG/ML IJ SOLN
15.0000 mg | Freq: Four times a day (QID) | INTRAMUSCULAR | Status: DC
  Administered 2020-01-30 – 2020-01-31 (×3): 15 mg via INTRAVENOUS
  Filled 2020-01-30 (×3): qty 1

## 2020-01-30 NOTE — Progress Notes (Signed)
PROGRESS NOTE    Nicholas Abbott  [id number] DOB: June 20, 1951 DOA: 01/28/2020 PCP: Valera Castle, MD   Brief Narrative:  HPI: Nicholas Abbott is a 69 y.o. male with medical history significant of hypertension, hyperlipidemia, diabetes mellitus, GERD, CAD, who presents with right lower leg pain and fever.  Pt had hx of right lower leg cellulitis.  Patient was hospitalized from 12/30//20-11/05/2019.  Patient states that after he was treated antibiotics, he initially improved significantly, but feels like it never completely resolved.  In the past several days, he developed worsening right lower leg pain and swelling.  The pain is constant, sharp, 5 out of 10 severity, nonradiating.  He developed fever and chills since yesterday.  He had temperature 102 at one time. Patient does not have chest pain, shortness breath, cough.  No nausea, vomiting, diarrhea, abdominal pain, symptoms of UTI or unilateral weakness.  3/29: Patient seen and examined.  Return from right lower extremity ultrasound.  Ultrasound negative for VTE.  Still with significant significant swelling of the right lower extremity and associated cellulitic change on the medial aspect of the right ankle.  Upon further interview patient states that he feels his infection never entirely resolved after his initial hospitalization in late December 2020.  He also endorses low-grade elevated temperatures at home.  3/30: Patient seen and examined.  Area of cellulitic change on right lower extremity has improved.  The foot is still edematous but erythema nearly resolved.  MRI demonstrates soft tissue edema without evidence of osteomyelitis, septic arthritis, abscess formation.  Assessment & Plan:   Principal Problem:   Cellulitis of right lower extremity Active Problems:   Hyperlipidemia   Hypertension   Diabetes mellitus without complication (HCC)   CAD (coronary artery disease)   Thrombocytopenia (HCC)   Cellulitis   Cellulitis  of right leg  Cellulitis of right lower extremity  Patient dose not meet criteria for sepsis, but has elevated lactic acid of 2.3,  WBC 11.9, patient is at high risk of developing sepsis.   Currently hemodynamically stable.  This is a recurrent issue. Patient appears to be responding to IV Rocephin however the etiology of his recurrent cellulitis is unknown Per the patient's wife the patient has been having low-grade temperatures every night for the past 3 months She has previously addressed this with outpatient physicians but thus far no work-up has been initiated Right lower extremity negative for VTE MRI negative for osteomyelitis, septic arthritis, abscess Plan: Continue Rocephin for now Transition to a oral agent at time of discharge.  I recommend cefdinir or similar third-generation cephalosporin as there is no culture data to guide antibiotic therapy and patient seems to be responding to Rocephin As needed pain control As needed nausea control Follow-up blood cultures, no growth to date  Thrombocytopenia [facility]):  This is a chronic issue.    Etiology is not clear. Possibly related to underlying chronic liver disease? Peripheral blood smear , ordered and pending Consider referral to outpatient hematology at the time of discharge  Hyperlipidemia: -zocor  HTN:  -Continue homeramipril -hydralazine prn  Diabetes mellitus without complication (HCC):Last [id number] on 09/05/19, well controled. Patient is takingglipizide, Metforminat home. 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  Currently receiving IV  antibiotics.  MRI demonstrates only cellulitis with no involvement of bone or septic joint.  Anticipate 1 additional day of IV antibiotics.  If exam continues to improve consider transition to oral antibiotics and discharge home on 01/31/2020  Consultants:   none  Procedures:   none  Antimicrobials:   Ceftriaxone (01/28/2020-  )   Subjective: Patient seen and examined Area of cellulitic change improving No other complaints  Objective: Vitals:   01/29/20 1154 01/29/20 2124 01/30/20 0623 01/30/20 1202  BP: (!) 157/90 (!) 145/71 138/88 (!) 162/78  Pulse: 61 62 (!) 50 (!) 52  Resp: 20 20 20 20   Temp: 97.9 F (36.6 C) 98.6 F (37 C) (!) 97.5 F (36.4 C) 98 F (36.7 C)  TempSrc: Oral Oral Oral Oral  SpO2: 100% 99% 100% 98%  Weight:      Height:        Intake/Output Summary (Last 24 hours) at 01/30/2020 1316 Last data filed at 01/29/2020 2030 Gross per 24 hour  Intake 646.49 ml  Output --  Net 646.49 ml   Filed Weights   01/28/20 1150  Weight: 127 kg    Examination:  General exam: Appears calm and comfortable  Respiratory system: Clear to auscultation. Respiratory effort normal. Cardiovascular system: S1 & S2 heard, RRR. No JVD, murmurs, rubs, gallops or clicks. No pedal edema. Gastrointestinal system: Obese, abdomen is nondistended, soft and nontender. No organomegaly or masses felt. Normal bowel sounds heard. Central nervous system: Alert and oriented. No focal neurological deficits. Extremities: Symmetric 5 x 5 power. Skin: Erythematous changes medial aspect of right ankle.  Right lower extremity swollen and tender to palpation Psychiatry: Judgement and insight appear normal. Mood & affect appropriate.     Data Reviewed: I have personally reviewed following labs and imaging studies  CBC: Recent Labs  Lab 01/28/20 1202 01/29/20 0501  WBC 11.9* 10.8*  NEUTROABS 6.5  --   HGB 14.3 13.2  HCT 40.2 38.1*  MCV 101.0* 103.8*  PLT 92* 88*   Basic Metabolic  Panel: Recent Labs  Lab 01/28/20 1202 01/29/20 0501  NA 136 139  K 4.2 4.1  CL 108 111  CO2 21* 22  GLUCOSE 294* 152*  BUN 24* 19  CREATININE 0.80 0.78  CALCIUM 8.7* 8.0*   GFR: Estimated Creatinine Clearance: 121.6 mL/min (by C-G formula based on SCr of 0.78 mg/dL). Liver Function Tests: Recent Labs  Lab 01/28/20 1202  AST 67*  ALT 63*  ALKPHOS 66  BILITOT 2.7*  PROT 6.3*  ALBUMIN 3.2*   No results for input(s): LIPASE, AMYLASE in the last 168 hours. No results for input(s): AMMONIA in the last 168 hours. Coagulation Profile: Recent Labs  Lab 01/28/20 1202  INR 1.4*   Cardiac Enzymes: No results for input(s): CKTOTAL, CKMB, CKMBINDEX, TROPONINI in the last 168 hours. BNP (last 3 results) No results for input(s): PROBNP in the last 8760 hours. HbA1C: No results for input(s): HGBA1C in the last 72 hours. CBG: Recent Labs  Lab 01/29/20 1152 01/29/20 1644 01/29/20 2125 01/30/20 0900 01/30/20 1158  GLUCAP 222* 200* 201* 210* 162*   Lipid Profile: No results for input(s): CHOL, HDL, LDLCALC, TRIG, CHOLHDL, LDLDIRECT in the last 72 hours. Thyroid Function Tests: No results for input(s): TSH, T4TOTAL, FREET4, T3FREE, THYROIDAB in the last 72 hours. Anemia Panel: No results for input(s): VITAMINB12, FOLATE, FERRITIN, TIBC, IRON, RETICCTPCT in the last 72 hours. Sepsis Labs: Recent Labs  Lab 01/28/20 1202 01/28/20 1400 01/28/20 1743 01/28/20 1945 01/29/20 0501 01/30/20 0614  PROCALCITON 0.79  --   --   --  0.58 0.34  LATICACIDVEN 2.3* 3.1* 1.4 1.4  --   --     Recent Results (from the past 240 hour(s))  Culture, blood (Routine x 2)     Status: None (Preliminary result)   Collection Time: 01/28/20 11:55 AM   Specimen: BLOOD  Result Value Ref Range Status   Specimen Description BLOOD BLOOD RIGHT FOREARM  Final   Special Requests   Final    BOTTLES DRAWN AEROBIC AND ANAEROBIC Blood Culture adequate volume   Culture   Final    NO GROWTH 2  DAYS Performed at [facility], [address], [place], [place] [address]    Report Status PENDING  Incomplete  Culture, blood (Routine x 2)     Status: None (Preliminary result)   Collection Time: 01/28/20  2:00 PM   Specimen: BLOOD  Result Value Ref Range Status   Specimen Description BLOOD BLOOD LEFT HAND  Final   Special Requests   Final    BOTTLES DRAWN AEROBIC AND ANAEROBIC Blood Culture results may not be optimal due to an inadequate volume of blood received in culture bottles   Culture   Final    NO GROWTH 2 DAYS Performed at [facility], [address] [place], [place] [address]    Report Status PENDING  Incomplete  SARS CORONAVIRUS 2 (TAT 6-24 HRS) Nasopharyngeal Nasopharyngeal Swab     Status: None   Collection Time: 01/28/20  2:00 PM   Specimen: Nasopharyngeal Swab  Result Value Ref Range Status   SARS Coronavirus 2 NEGATIVE NEGATIVE Final    Comment: (NOTE) SARS-CoV-2 target nucleic acids are NOT DETECTED. The SARS-CoV-2 RNA is generally detectable in upper and lower respiratory specimens during the acute phase of infection. Negative results do not preclude SARS-CoV-2 infection, do not rule out co-infections with other pathogens, and should not be used as the sole basis for treatment or other patient management decisions. Negative results must be combined with clinical observations, patient history, and epidemiological information. The expected result is Negative. Fact Sheet for Patients: SugarRoll.be Fact Sheet for Healthcare Providers: https://www.woods-mathews.com/ This test is not yet approved or cleared by the [place] FDA and  has been authorized for detection and/or diagnosis of SARS-CoV-2 by FDA under an Emergency Use Authorization (EUA). This EUA will remain  in effect (meaning this test can be used) for the duration of the COVID-19 declaration under Section 56 4(b)(1) of the Act, 21  U.S.C. section 360bbb-3(b)(1), unless the authorization is terminated or revoked sooner. Performed at [facility], [place] [address], [place], [place] [address]          Radiology Studies: MR ANKLE RIGHT W WO CONTRAST  Result Date: 01/29/2020 CLINICAL DATA:  Cellulitis of the medial aspect of the right ankle since January 2021. EXAM: MRI OF THE RIGHT ANKLE WITHOUT AND WITH CONTRAST TECHNIQUE: Multiplanar, multisequence MR imaging of the ankle was performed before and after the administration of intravenous contrast. CONTRAST:  75mL GADAVIST GADOBUTROL 1 MMOL/ML IV SOLN COMPARISON:  Radiographs dated 11/03/2019 FINDINGS: TENDONS Peroneal: There is a small amount of fluid in the peroneal tendon sheath just proximal to the tip of the lateral malleolus. Otherwise normal. Posteromedial: Normal. Anterior: Normal. Achilles: Normal. Plantar Fascia: Normal. LIGAMENTS Lateral: Intact. Medial: Intact. CARTILAGE Ankle Joint: Normal. Subtalar Joints/Sinus Tarsi: Minimal degenerative changes of the subtalar  joint with a small posterior subtalar joint effusion. No significant abnormality of the sinus tarsi. Bones: Negative. Other: Circumferential prominent subcutaneous edema of the distal lower leg and around the ankle and extending onto the dorsum of the foot. No definable abscesses. Slight enhancement of the interstices of the subcutaneous fat around the ankle consistent with cellulitis. IMPRESSION: IMPRESSION Circumferential edema and cellulitis of the lower leg extending around the ankle. No abscess or osteomyelitis or septic arthritis. Electronically Signed   By: Lorriane Shire M.D.   On: 01/29/2020 14:24   US Venous Img Lower Unilateral Right (DVT)  Result Date: 01/29/2020 CLINICAL DATA:  69 year old with right leg cellulitis and pain. EXAM: RIGHT LOWER EXTREMITY VENOUS DOPPLER ULTRASOUND TECHNIQUE: Gray-scale sonography with graded compression, as well as color Doppler and duplex ultrasound were  performed to evaluate the lower extremity deep venous systems from the level of the common femoral vein and including the common femoral, femoral, profunda femoral, popliteal and calf veins including the posterior tibial, peroneal and gastrocnemius veins when visible. The superficial great saphenous vein was also interrogated. Spectral Doppler was utilized to evaluate flow at rest and with distal augmentation maneuvers in the common femoral, femoral and popliteal veins. COMPARISON:  11/02/2019 FINDINGS: Contralateral Common Femoral Vein: Respiratory phasicity is normal and symmetric with the symptomatic side. No evidence of thrombus. Normal compressibility. Common Femoral Vein: No evidence of thrombus. Normal compressibility, respiratory phasicity and response to augmentation. Saphenofemoral Junction: No evidence of thrombus. Normal compressibility and flow on color Doppler imaging. Profunda Femoral Vein: No evidence of thrombus. Normal compressibility and flow on color Doppler imaging. Femoral Vein: No evidence of thrombus. Normal compressibility, respiratory phasicity and response to augmentation. Popliteal Vein: No evidence of thrombus. Normal compressibility, respiratory phasicity and response to augmentation. Calf Veins: No evidence of thrombus. Normal compressibility and flow on color Doppler imaging. Other Findings: Prominent lymph node in the right groin with a normal fatty hilum. This lymph node measures 1.3 cm in the short axis and likely reactive based on the history of cellulitis. IMPRESSION: Negative for deep venous thrombosis in right lower extremity. Electronically Signed   By: Markus Daft M.D.   On: 01/29/2020 09:50        Scheduled Meds: . aspirin EC  81 mg Oral Daily  . enoxaparin (LOVENOX) injection  40 mg Subcutaneous Q24H  . folic acid  2.5 mg Oral Daily   And  . vitamin B-6  25 mg Oral Daily   And  . vitamin B-12  2,000 mcg Oral Daily  . insulin aspart  0-5 Units Subcutaneous QHS   . insulin aspart  0-9 Units Subcutaneous TID WC  . levothyroxine  75 mcg Oral Q0600  . ramipril  2.5 mg Oral Daily  . simvastatin  40 mg Oral QHS  . vitamin B-12  5,000 mcg Oral Daily   Continuous Infusions: . cefTRIAXone (ROCEPHIN)  IV Stopped (01/29/20 1511)     LOS: 1 day    Time spent: 35 minutes    Sidney Ace, MD [facility] Pager [phone]  If 7PM-7AM, please contact night-coverage 01/30/2020, 1:16 PM

## 2020-01-31 LAB — CBC
HCT: 39.5 % (ref 39.0–52.0)
Hemoglobin: 13.8 g/dL (ref 13.0–17.0)
MCH: 35.8 pg — ABNORMAL HIGH (ref 26.0–34.0)
MCHC: 34.9 g/dL (ref 30.0–36.0)
MCV: 102.3 fL — ABNORMAL HIGH (ref 80.0–100.0)
Platelets: 104 10*3/uL — ABNORMAL LOW (ref 150–400)
RBC: 3.86 MIL/uL — ABNORMAL LOW (ref 4.22–5.81)
RDW: 13.7 % (ref 11.5–15.5)
WBC: 7.2 10*3/uL (ref 4.0–10.5)
nRBC: 0 % (ref 0.0–0.2)

## 2020-01-31 LAB — GLUCOSE, CAPILLARY
Glucose-Capillary: 115 mg/dL — ABNORMAL HIGH (ref 70–99)
Glucose-Capillary: 178 mg/dL — ABNORMAL HIGH (ref 70–99)

## 2020-01-31 LAB — BASIC METABOLIC PANEL
Anion gap: 5 (ref 5–15)
BUN: 15 mg/dL (ref 8–23)
CO2: 22 mmol/L (ref 22–32)
Calcium: 8.2 mg/dL — ABNORMAL LOW (ref 8.9–10.3)
Chloride: 110 mmol/L (ref 98–111)
Creatinine, Ser: 0.76 mg/dL (ref 0.61–1.24)
GFR calc Af Amer: >60 mL/min (ref >60)
GFR calc non Af Amer: >60 mL/min (ref >60)
Glucose, Bld: 221 mg/dL — ABNORMAL HIGH (ref 70–99)
Potassium: 4 mmol/L (ref 3.5–5.1)
Sodium: 137 mmol/L (ref 135–145)

## 2020-01-31 LAB — MAGNESIUM: Magnesium: 1.8 mg/dL (ref 1.7–2.4)

## 2020-01-31 MED ORDER — CEPHALEXIN 500 MG PO CAPS: 500.0000 mg | ORAL_CAPSULE | Freq: Three times a day (TID) | ORAL | Status: DC

## 2020-01-31 MED ORDER — CEFDINIR 300 MG PO CAPS: 300.0000 mg | ORAL_CAPSULE | Freq: Two times a day (BID) | ORAL | 0 refills | Status: AC

## 2020-01-31 MED ORDER — CEFDINIR 300 MG PO CAPS: 300.0000 mg | ORAL_CAPSULE | Freq: Two times a day (BID) | ORAL | Status: DC

## 2020-01-31 NOTE — Progress Notes (Signed)
01/31/2020 [place] to be D/C'd Home per MD order.  Discussed prescriptions and follow up appointments with the patient. Prescriptions given to patient, medication list explained in detail. Pt verbalized understanding.  Allergies as of 01/31/2020      Reactions   Pioglitazone Rash      Medication List    TAKE these medications   aspirin EC 81 MG tablet Take 81 mg by mouth daily. Notes to patient: Morning 02/01/20   cefdinir 300 MG capsule Commonly known as: OMNICEF Take 1 capsule (300 mg total) by mouth every 12 (twelve) hours for 10 days. Antibiotic for cellulitis. Start taking on: February 01, 2020 Notes to patient: Night 01/31/20   Cinnamon 500 MG capsule Take 1,000 mg by mouth 2 (two) times daily as needed (blood sugar control). Notes to patient: As needed   Folbic 2.5-25-2 MG Tabs tablet Generic drug: folic acid-pyridoxine-cyancobalamin Take 1 tablet by mouth daily. Notes to patient: Morning 02/01/20   furosemide 40 MG tablet Commonly known as: LASIX Take 40 mg by mouth daily. Notes to patient: Morning 02/01/20   levothyroxine 75 MCG tablet Commonly known as: SYNTHROID Take 75 mcg by mouth daily. Notes to patient: Morning 02/01/20   metFORMIN 500 MG 24 hr tablet Commonly known as: GLUCOPHAGE-XR Take 500 mg by mouth 2 (two) times daily with a meal. Notes to patient: With evening meal 01/31/20   ramipril 2.5 MG capsule Commonly known as: ALTACE Take 2.5 mg by mouth daily. Notes to patient: Morning 02/01/20   sildenafil 20 MG tablet Commonly known as: REVATIO Take 20 mg by mouth daily as needed. Notes to patient: As needed   simvastatin 40 MG tablet Commonly known as: ZOCOR Take 40 mg by mouth at bedtime. Notes to patient: Before bed 01/31/20   Vitamin B-12 5000 MCG Tbdp Take 5,000 mcg by mouth daily. Notes to patient: Morning 02/01/20       Vitals:   01/31/20 0413 01/31/20 1138  BP: (!) 105/40 (!) 155/76  Pulse: (!) 51 (!) 51  Resp: 18   Temp: (!)  97.5 F (36.4 C) 97.9 F (36.6 C)  SpO2: 99% 100%    Skin clean, dry and intact without evidence of skin break down, no evidence of skin tears noted. IV catheter discontinued intact. Site without signs and symptoms of complications. Dressing and pressure applied. Pt denies pain at this time. No complaints noted.  An After Visit Summary was printed and given to the patient. Patient escorted via [place], and D/C home via private auto.  Dola Argyle

## 2020-01-31 NOTE — Discharge Summary (Signed)
Physician Discharge Summary   Nicholas Abbott  male DOB: July 28, 1951  [id number]  PCP: Valera Castle, MD  Admit date: 01/28/2020 Discharge date: 01/31/2020  Admitted From: home Disposition:  home CODE STATUS: Full code  Discharge Instructions    Ambulatory referral to Infectious Disease   Complete by: As directed    Persistent low-grade fevers only at night.   Diet - low sodium heart healthy   Complete by: As directed    Discharge instructions   Complete by: As directed    I am sending you home on Cefdinir 300 mg twice a day for 10 more days, because you took Keflex last time and you said your infection didn't quick go away.    Please follow up with Infectious Disease doctor if your fever returns after finishing 10 more days of antibiotics.  Your blood smear didn't show anything acutely abnormal.   Dr. Enzo Bi - -   Increase activity slowly   Complete by: As directed        Hospital Course:  For full details, please see H&P, progress notes, consult notes and ancillary notes.  Briefly,  Nicholas Abbott a 69 y.o.malewith medical history significant ofhypertension, hyperlipidemia, diabetes mellitus, GERD, CAD, who presented with right lower leg pain andfever.  Pt had hx ofright lower leg cellulitis. Patient was hospitalized from 12/30//20-11/05/2019.Patient stated that after he was treated with antibiotics, he initially improved significantly, but felt like itnever completely resolved. In the past several days prior to presentation, he developed worsening right lower leg pain andswelling.  Cellulitis of right lower extremity Patientdid not meetcriteria for sepsis, but has elevated lactic acid of 2.3,  WBC 11.9.  Right lower extremity negative for VTE.  MRI negative for osteomyelitis, septic arthritis, abscess.  Pt received 4 days of IV ceftriaxone with improvement in erythema, edema and pain in his right leg.  Pt was discharged on 10 more days  of oral abx with cefdinir.  Pt was advised to follow up with outpatient ID if fever and cellulitis symptoms return after he finishes his abx course.  Thrombocytopenia ([place]), chronic Etiology is not clear.  Peripheral blood smear unremarkable.  Plt 104 on the day of discharge.  Hyperlipidemia: Continued zocor  HTN:  Continued homeramipril  Diabetes mellitus without complication ([place]) Last [id number] on 09/05/19, well controled.  Continued home regimen.  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   cefdinir 300 MG capsule Commonly known as: OMNICEF Take 1 capsule (300 mg total) by mouth every 12 (twelve) hours for 10 days. Antibiotic for cellulitis. Start taking on: February 01, 2020   Cinnamon 500 MG capsule Take 1,000 mg by mouth 2 (two) times daily as needed (blood sugar control).   Folbic 2.5-25-2 MG Tabs tablet Generic drug: folic acid-pyridoxine-cyancobalamin Take 1 tablet by mouth daily.   furosemide 40 MG tablet Commonly known as: LASIX  Take 40 mg by mouth daily.   levothyroxine 75 MCG tablet Commonly known as: SYNTHROID Take 75 mcg by mouth daily.   metFORMIN 500 MG 24 hr tablet Commonly known as: GLUCOPHAGE-XR Take 500 mg by mouth 2 (two) times daily with a meal.   ramipril 2.5 MG capsule Commonly known as: ALTACE Take 2.5 mg by mouth daily.   sildenafil 20 MG tablet Commonly known as:  REVATIO Take 20 mg by mouth daily as needed.   simvastatin 40 MG tablet Commonly known as: ZOCOR Take 40 mg by mouth at bedtime.   Vitamin B-12 5000 MCG Tbdp Take 5,000 mcg by mouth daily.       Follow-up Information    Leonel Ramsay, MD Follow up.   Specialty: Infectious Diseases Why: follow up with infection disease if your nightly low-grade fever persists. Contact information: [place] [address] [phone]           Allergies  Allergen Reactions  . Pioglitazone Rash     The results of significant diagnostics from this hospitalization (including imaging, microbiology, ancillary and laboratory) are listed below for reference.   Consultations:   Procedures/Studies: MR ANKLE RIGHT W WO CONTRAST  Result Date: 01/29/2020 CLINICAL DATA:  Cellulitis of the medial aspect of the right ankle since January 2021. EXAM: MRI OF THE RIGHT ANKLE WITHOUT AND WITH CONTRAST TECHNIQUE: Multiplanar, multisequence MR imaging of the ankle was performed before and after the administration of intravenous contrast. CONTRAST:  34mL GADAVIST GADOBUTROL 1 MMOL/ML IV SOLN COMPARISON:  Radiographs dated 11/03/2019 FINDINGS: TENDONS Peroneal: There is a small amount of fluid in the peroneal tendon sheath just proximal to the tip of the lateral malleolus. Otherwise normal. Posteromedial: Normal. Anterior: Normal. Achilles: Normal. Plantar Fascia: Normal. LIGAMENTS Lateral: Intact. Medial: Intact. CARTILAGE Ankle Joint: Normal. Subtalar Joints/Sinus Tarsi: Minimal degenerative changes of the subtalar joint with a small posterior subtalar joint effusion. No significant abnormality of the sinus tarsi. Bones: Negative. Other: Circumferential prominent subcutaneous edema of the distal lower leg and around the ankle and extending onto the dorsum of the foot. No definable abscesses. Slight enhancement of the interstices of the subcutaneous fat around the ankle consistent with  cellulitis. IMPRESSION: IMPRESSION Circumferential edema and cellulitis of the lower leg extending around the ankle. No abscess or osteomyelitis or septic arthritis. Electronically Signed   By: Lorriane Shire M.D.   On: 01/29/2020 14:24   US Venous Img Lower Unilateral Right (DVT)  Result Date: 01/29/2020 CLINICAL DATA:  69 year old with right leg cellulitis and pain. EXAM: RIGHT LOWER EXTREMITY VENOUS DOPPLER ULTRASOUND TECHNIQUE: Gray-scale sonography with graded compression, as well as color Doppler and duplex ultrasound were performed to evaluate the lower extremity deep venous systems from the level of the common femoral vein and including the common femoral, femoral, profunda femoral, popliteal and calf veins including the posterior tibial, peroneal and gastrocnemius veins when visible. The superficial great saphenous vein was also interrogated. Spectral Doppler was utilized to evaluate flow at rest and with distal augmentation maneuvers in the common femoral, femoral and popliteal veins. COMPARISON:  11/02/2019 FINDINGS: Contralateral Common Femoral Vein: Respiratory phasicity is normal and symmetric with the symptomatic side. No evidence of thrombus. Normal compressibility. Common Femoral Vein: No evidence of thrombus. Normal compressibility, respiratory phasicity and response to augmentation. Saphenofemoral Junction: No evidence of thrombus. Normal compressibility and flow on color Doppler imaging. Profunda Femoral Vein: No evidence of thrombus. Normal compressibility and flow on color Doppler imaging. Femoral Vein: No  evidence of thrombus. Normal compressibility, respiratory phasicity and response to augmentation. Popliteal Vein: No evidence of thrombus. Normal compressibility, respiratory phasicity and response to augmentation. Calf Veins: No evidence of thrombus. Normal compressibility and flow on color Doppler imaging. Other Findings: Prominent lymph node in the right groin with a normal fatty  hilum. This lymph node measures 1.3 cm in the short axis and likely reactive based on the history of cellulitis. IMPRESSION: Negative for deep venous thrombosis in right lower extremity. Electronically Signed   By: Markus Daft M.D.   On: 01/29/2020 09:50      Labs: BNP (last 3 results) Recent Labs    01/28/20 1202  BNP 99991111   Basic Metabolic Panel: Recent Labs  Lab 01/28/20 1202 01/29/20 0501 01/31/20 1043  NA 136 139 137  K 4.2 4.1 4.0  CL 108 111 110  CO2 21* 22 22  GLUCOSE 294* 152* 221*  BUN 24* 19 15  CREATININE 0.80 0.78 0.76  CALCIUM 8.7* 8.0* 8.2*  MG  --   --  1.8   Liver Function Tests: Recent Labs  Lab 01/28/20 1202  AST 67*  ALT 63*  ALKPHOS 66  BILITOT 2.7*  PROT 6.3*  ALBUMIN 3.2*   No results for input(s): LIPASE, AMYLASE in the last 168 hours. No results for input(s): AMMONIA in the last 168 hours. CBC: Recent Labs  Lab 01/28/20 1202 01/29/20 0501 01/31/20 1043  WBC 11.9* 10.8* 7.2  NEUTROABS 6.5  --   --   HGB 14.3 13.2 13.8  HCT 40.2 38.1* 39.5  MCV 101.0* 103.8* 102.3*  PLT 92* 88* 104*   Cardiac Enzymes: No results for input(s): CKTOTAL, CKMB, CKMBINDEX, TROPONINI in the last 168 hours. BNP: Invalid input(s): POCBNP CBG: Recent Labs  Lab 01/30/20 1158 01/30/20 1648 01/30/20 2122 01/31/20 0732 01/31/20 1136  GLUCAP 162* 222* 171* 115* 178*   D-Dimer No results for input(s): DDIMER in the last 72 hours. Hgb A1c No results for input(s): HGBA1C in the last 72 hours. Lipid Profile No results for input(s): CHOL, HDL, LDLCALC, TRIG, CHOLHDL, LDLDIRECT in the last 72 hours. Thyroid function studies No results for input(s): TSH, T4TOTAL, T3FREE, THYROIDAB in the last 72 hours.  Invalid input(s): FREET3 Anemia work up No results for input(s): VITAMINB12, FOLATE, FERRITIN, TIBC, IRON, RETICCTPCT in the last 72 hours. Urinalysis    Component Value Date/Time   COLORURINE YELLOW (A) 01/29/2020 1557   APPEARANCEUR CLEAR (A)  01/29/2020 1557   LABSPEC 1.014 01/29/2020 1557   PHURINE 6.0 01/29/2020 1557   GLUCOSEU 50 (A) 01/29/2020 1557   HGBUR MODERATE (A) 01/29/2020 1557   BILIRUBINUR NEGATIVE 01/29/2020 1557   KETONESUR NEGATIVE 01/29/2020 1557   PROTEINUR NEGATIVE 01/29/2020 1557   NITRITE NEGATIVE 01/29/2020 1557   LEUKOCYTESUR NEGATIVE 01/29/2020 1557   Sepsis Labs Invalid input(s): PROCALCITONIN,  WBC,  LACTICIDVEN Microbiology Recent Results (from the past 240 hour(s))  Culture, blood (Routine x 2)     Status: None (Preliminary result)   Collection Time: 01/28/20 11:55 AM   Specimen: BLOOD  Result Value Ref Range Status   Specimen Description BLOOD BLOOD RIGHT FOREARM  Final   Special Requests   Final    BOTTLES DRAWN AEROBIC AND ANAEROBIC Blood Culture adequate volume   Culture   Final    NO GROWTH 3 DAYS Performed at [facility], [address] [place], [place] [address]    Report Status PENDING  Incomplete  Culture, blood (Routine x 2)  Status: None (Preliminary result)   Collection Time: 01/28/20  2:00 PM   Specimen: BLOOD  Result Value Ref Range Status   Specimen Description BLOOD BLOOD LEFT HAND  Final   Special Requests   Final    BOTTLES DRAWN AEROBIC AND ANAEROBIC Blood Culture results may not be optimal due to an inadequate volume of blood received in culture bottles   Culture   Final    NO GROWTH 3 DAYS Performed at [facility], [address] [place], [place] [address]    Report Status PENDING  Incomplete  SARS CORONAVIRUS 2 (TAT 6-24 HRS) Nasopharyngeal Nasopharyngeal Swab     Status: None   Collection Time: 01/28/20  2:00 PM   Specimen: Nasopharyngeal Swab  Result Value Ref Range Status   SARS Coronavirus 2 NEGATIVE NEGATIVE Final    Comment: (NOTE) SARS-CoV-2 target nucleic acids are NOT DETECTED. The SARS-CoV-2 RNA is generally detectable in upper and lower respiratory specimens during the acute phase of infection. Negative results do not  preclude SARS-CoV-2 infection, do not rule out co-infections with other pathogens, and should not be used as the sole basis for treatment or other patient management decisions. Negative results must be combined with clinical observations, patient history, and epidemiological information. The expected result is Negative. Fact Sheet for Patients: SugarRoll.be Fact Sheet for Healthcare Providers: https://www.woods-mathews.com/ This test is not yet approved or cleared by the [place] FDA and  has been authorized for detection and/or diagnosis of SARS-CoV-2 by FDA under an Emergency Use Authorization (EUA). This EUA will remain  in effect (meaning this test can be used) for the duration of the COVID-19 declaration under Section 56 4(b)(1) of the Act, 21 U.S.C. section 360bbb-3(b)(1), unless the authorization is terminated or revoked sooner. Performed at [facility], [place] [address] [place], [place] [address]      Total time spend on discharging this patient, including the last patient exam, discussing the hospital stay, instructions for ongoing care as it relates to all pertinent caregivers, as well as preparing the medical discharge records, prescriptions, and/or referrals as applicable, is 40 minutes.    Enzo Bi, MD  [facility] 01/31/2020, 12:47 PM  If 7PM-7AM, please contact night-coverage

## 2020-02-02 LAB — CULTURE, BLOOD (ROUTINE X 2)
Culture: NO GROWTH
Culture: NO GROWTH
Special Requests: ADEQUATE

## 2020-02-20 ENCOUNTER — Telehealth: Payer: Self-pay | Admitting: Infectious Diseases

## 2020-02-20 NOTE — Telephone Encounter (Signed)
Called patient about setting up an appt - said he will call back to set appt up if he needs [place]

## 2021-04-26 NOTE — Progress Notes (Signed)
[facility] [place], [place] [address]  Pulmonary Sleep Medicine   Office Visit Note  Patient Name: Nicholas Abbott DOB: 1951/09/30 MRN [id number]    Chief Complaint: Obstructive Sleep Apnea visit  Brief History:  Natale is seen today for initial consult to establish care for ongoing PAP therapy. The patient has a 3 year history of sleep apnea and is currently on CPAP @ 10 cmH2O.  Prior to therapy, patient had a history of snoring, HTN, GERD.  Patient is using PAP nightly and feels better after sleeping with PAP.  The patient reports benefiting from PAP use. Reported sleepiness is  no longer a problem and the Epworth Sleepiness Score is 0 out of 24. The patient never take naps. The patient complains of the following: mold in water chmber. Reviewed regular cleaning disinfection.  The compliance download shows  an average usage time of 9:02 hours, 99%. The AHI is 0.5  The patient does not complain of limb movements disrupting sleep.  ROS  General: (-) fever, (-) chills, (-) night sweat Nose and Sinuses: (-) nasal stuffiness or itchiness, (-) postnasal drip, (-) nosebleeds, (-) sinus trouble. Mouth and Throat: (-) sore throat, (-) hoarseness. Neck: (-) swollen glands, (-) enlarged thyroid, (-) neck pain. Respiratory: - cough, - shortness of breath, - wheezing. Neurologic: - numbness, - tingling. Psychiatric: - anxiety, - depression   Current Medication: Outpatient Encounter Medications as of 04/28/2021  Medication Sig   levothyroxine (SYNTHROID) 100 MCG tablet Take 1 tablet by mouth daily.   rosuvastatin (CRESTOR) 20 MG tablet Take by mouth.   sildenafil (REVATIO) 20 MG tablet Take by mouth.   aspirin EC 81 MG tablet Take 81 mg by mouth daily.   Cinnamon 500 MG capsule Take 1,000 mg by mouth 2 (two) times daily as needed (blood sugar control).   Cinnamon 500 MG capsule Take by mouth.   Cyanocobalamin (VITAMIN B-12) 5000 MCG TBDP Take 5,000 mcg by mouth  daily.    cyanocobalamin 1000 MCG tablet Take by mouth.   folic acid-pyridoxine-cyancobalamin (FOLBIC) 2.5-25-2 MG TABS tablet Take 1 tablet by mouth daily.   furosemide (LASIX) 40 MG tablet Take 40 mg by mouth daily.   JARDIANCE 10 MG TABS tablet Take 10 mg by mouth every morning.   levothyroxine (SYNTHROID) 75 MCG tablet Take 75 mcg by mouth daily.    metFORMIN (GLUCOPHAGE-XR) 500 MG 24 hr tablet Take 500 mg by mouth 2 (two) times daily with a meal.   ramipril (ALTACE) 2.5 MG capsule Take 2.5 mg by mouth daily.   sildenafil (REVATIO) 20 MG tablet Take 20 mg by mouth daily as needed.    simvastatin (ZOCOR) 40 MG tablet Take 40 mg by mouth at bedtime.    traMADol (ULTRAM) 50 MG tablet    No facility-administered encounter medications on file as of 04/28/2021.    Surgical History: Past Surgical History:  Procedure Laterality Date   CATARACT EXTRACTION     COLONOSCOPY WITH PROPOFOL N/A 01/01/2020   Procedure: COLONOSCOPY WITH PROPOFOL;  Surgeon: [name], Benay Pike, MD;  Location: [facility] ENDOSCOPY;  Service: Gastroenterology;  Laterality: N/A;   CORONARY ANGIOPLASTY     ESOPHAGOGASTRODUODENOSCOPY (EGD) WITH PROPOFOL N/A 01/01/2020   Procedure: ESOPHAGOGASTRODUODENOSCOPY (EGD) WITH PROPOFOL;  Surgeon: [name], Benay Pike, MD;  Location: [facility] ENDOSCOPY;  Service: Gastroenterology;  Laterality: N/A;   EYE SURGERY     RETINAL DETACHMENT SURGERY      Medical History: Past Medical History:  Diagnosis Date  Diabetes mellitus without complication (HCC)    GERD (gastroesophageal reflux disease)    Hyperlipidemia    Hypertension    Left shoulder pain    Myocardial infarction ([place])     Family History: Non contributory to the present illness  Social History: Social History   Socioeconomic History   Marital status: Married    Spouse name: Not on file   Number of children: Not on file   Years of education: Not on file   Highest education level: Not on file  Occupational History   Not on  file  Tobacco Use   Smoking status: Never   Smokeless tobacco: Never  Vaping Use   Vaping Use: Never used  Substance and Sexual Activity   Alcohol use: Yes    Alcohol/week: 5.0 standard drinks    Types: 5 Shots of liquor per week   Drug use: Never   Sexual activity: Yes    Birth control/protection: Post-menopausal  Other Topics Concern   Not on file  Social History Narrative   Not on file   Social Determinants of Health   Financial Resource Strain: Not on file  Food Insecurity: Not on file  Transportation Needs: Not on file  Physical Activity: Not on file  Stress: Not on file  Social Connections: Not on file  Intimate Partner Violence: Not on file    Vital Signs: Blood pressure (!) 147/72, pulse 64, resp. rate 18, height 6\' 2"  (1.88 m), weight 263 lb 4.8 oz (119.4 kg), SpO2 97 %.  Examination: General Appearance: The patient is well-developed, well-nourished, and in no distress. Neck Circumference: 44 Skin: Gross inspection of skin unremarkable. Head: normocephalic, no gross deformities. Eyes: no gross deformities noted. ENT: ears appear grossly normal Neurologic: Alert and oriented. No involuntary movements.    EPWORTH SLEEPINESS SCALE:  Scale:  (0)= no chance of dozing; (1)= slight chance of dozing; (2)= moderate chance of dozing; (3)= high chance of dozing  Chance  Situtation    Sitting and reading: 0    Watching TV: 0    Sitting Inactive in public: 0    As a passenger in car: 0      Lying down to rest: 0    Sitting and talking: 0    Sitting quielty after lunch: 0    In a car, stopped in traffic: 0   TOTAL SCORE:   0 out of 24    SLEEP STUDIES:  PSG 03/29/17 - AHI 73, low SpO102min 82%   CPAP COMPLIANCE DATA:  Date Range: 04/25/20 - 04/24/21  Average Daily Use: 9:02 hours  Median Use: 9:13 hours  Compliance for > 4 Hours: 99%  AHI: 0.5 respiratory events per hour  Days Used: 360/365  Mask Leak: 19.7 Lpm  95th Percentile  Pressure: 10 cmH2O         LABS: No results found for this or any previous visit (from the past 2160 hour(s)).  Radiology: MR ANKLE RIGHT W WO CONTRAST  Result Date: 01/29/2020 CLINICAL DATA:  Cellulitis of the medial aspect of the right ankle since January 2021. EXAM: MRI OF THE RIGHT ANKLE WITHOUT AND WITH CONTRAST TECHNIQUE: Multiplanar, multisequence MR imaging of the ankle was performed before and after the administration of intravenous contrast. CONTRAST:  75mL GADAVIST GADOBUTROL 1 MMOL/ML IV SOLN COMPARISON:  Radiographs dated 11/03/2019 FINDINGS: TENDONS Peroneal: There is a small amount of fluid in the peroneal tendon sheath just proximal to the tip of the lateral malleolus. Otherwise normal. Posteromedial: Normal. Anterior:  Normal. Achilles: Normal. Plantar Fascia: Normal. LIGAMENTS Lateral: Intact. Medial: Intact. CARTILAGE Ankle Joint: Normal. Subtalar Joints/Sinus Tarsi: Minimal degenerative changes of the subtalar joint with a small posterior subtalar joint effusion. No significant abnormality of the sinus tarsi. Bones: Negative. Other: Circumferential prominent subcutaneous edema of the distal lower leg and around the ankle and extending onto the dorsum of the foot. No definable abscesses. Slight enhancement of the interstices of the subcutaneous fat around the ankle consistent with cellulitis. IMPRESSION: IMPRESSION Circumferential edema and cellulitis of the lower leg extending around the ankle. No abscess or osteomyelitis or septic arthritis. Electronically Signed   By: Lorriane Shire M.D.   On: 01/29/2020 14:24   US Venous Img Lower Unilateral Right (DVT)  Result Date: 01/29/2020 CLINICAL DATA:  70 year old with right leg cellulitis and pain. EXAM: RIGHT LOWER EXTREMITY VENOUS DOPPLER ULTRASOUND TECHNIQUE: Gray-scale sonography with graded compression, as well as color Doppler and duplex ultrasound were performed to evaluate the lower extremity deep venous systems from the  level of the common femoral vein and including the common femoral, femoral, profunda femoral, popliteal and calf veins including the posterior tibial, peroneal and gastrocnemius veins when visible. The superficial great saphenous vein was also interrogated. Spectral Doppler was utilized to evaluate flow at rest and with distal augmentation maneuvers in the common femoral, femoral and popliteal veins. COMPARISON:  11/02/2019 FINDINGS: Contralateral Common Femoral Vein: Respiratory phasicity is normal and symmetric with the symptomatic side. No evidence of thrombus. Normal compressibility. Common Femoral Vein: No evidence of thrombus. Normal compressibility, respiratory phasicity and response to augmentation. Saphenofemoral Junction: No evidence of thrombus. Normal compressibility and flow on color Doppler imaging. Profunda Femoral Vein: No evidence of thrombus. Normal compressibility and flow on color Doppler imaging. Femoral Vein: No evidence of thrombus. Normal compressibility, respiratory phasicity and response to augmentation. Popliteal Vein: No evidence of thrombus. Normal compressibility, respiratory phasicity and response to augmentation. Calf Veins: No evidence of thrombus. Normal compressibility and flow on color Doppler imaging. Other Findings: Prominent lymph node in the right groin with a normal fatty hilum. This lymph node measures 1.3 cm in the short axis and likely reactive based on the history of cellulitis. IMPRESSION: Negative for deep venous thrombosis in right lower extremity. Electronically Signed   By: Markus Daft M.D.   On: 01/29/2020 09:50    No results found.  No results found.    Assessment and Plan: Patient Active Problem List   Diagnosis Date Noted   GERD (gastroesophageal reflux disease) 04/28/2021   Sleep apnea 04/28/2021   Hyperlipidemia 04/28/2021   Cellulitis of right leg 01/28/2020   Cellulitis 11/03/2019   Hyperlipidemia    Hypertension    Diabetes mellitus without  complication (HCC)    CAD (coronary artery disease)    Thrombocytopenia (HCC)    Acute metabolic encephalopathy    Hypoglycemia    Sepsis ([place])    Cellulitis of right lower extremity    Type 2 diabetes mellitus without complication, without long-term current use of insulin ([place]) 02/11/2017   Coronary artery disease 06/06/2014   Hypertension 06/06/2014   Obesity 05/30/2012   Myocardial infarction ([place]) 03/02/2000   1. OSA on CPAP The patient does tolerate PAP and reports  benefit from PAP use. The patient was reminded how to clean equipment  and advised to replace supplies routinely. The patient was also counselled on weight loss. The compliance is excellent. The AHI is 0.5.   OSA- continue excellent compliance with PAP. F/u one year.  2. CPAP use counseling CPAP Counseling: had a lengthy discussion with the patient regarding the importance of PAP therapy in management of the sleep apnea. Patient appears to understand the risk factor reduction and also understands the risks associated with untreated sleep apnea. Patient will try to make a good faith effort to remain compliant with therapy. Also instructed the patient on proper cleaning of the device including the water must be changed daily if possible and use of distilled water is preferred. Patient understands that the machine should be regularly cleaned with appropriate recommended cleaning solutions that do not damage the PAP machine for example given white vinegar and water rinses. Other methods such as ozone treatment may not be as good as these simple methods to achieve cleaning.   3. Class 1 obesity with serious comorbidity and body mass index (BMI) of 33.0 to 33.9 in adult, unspecified obesity type Obesity Counseling: Had a lengthy discussion regarding patients BMI and weight issues. Patient was instructed on portion control as well as increased activity. Also discussed caloric restrictions with trying to maintain intake less than  2000 Kcal. Discussions were made in accordance with the 5As of weight management. Simple actions such as not eating late and if able to, taking a walk is suggested.     The patient does tolerate PAP and reports  benefit from PAP use. The patient was reminded how to clean equipment  and advised to replace supplies routinely. The patient was also counselled on weight loss. The compliance is excellent. The AHI is 0.5.   OSA- continue excellent compliance with PAP. F/u one year.   General Counseling: I have discussed the findings of the evaluation and examination with Lynann Bologna.  I have also discussed any further diagnostic evaluation thatmay be needed or ordered today. Masaichi verbalizes understanding of the findings of todays visit. We also reviewed his medications today and discussed drug interactions and side effects including but not limited excessive drowsiness and altered mental states. We also discussed that there is always a risk not just to him but also people around him. he has been encouraged to call the office with any questions or concerns that should arise related to todays visit.  No orders of the defined types were placed in this encounter.       I have personally obtained a history, examined the patient, evaluated laboratory and imaging results, formulated the assessment and plan and placed orders.   This patient was seen today by Tressie Ellis, PA-C in collaboration with Dr. Devona Konig.    Allyne Gee, MD [facility] Diplomate ABMS Pulmonary and Critical Care Medicine Sleep medicine

## 2021-04-28 ENCOUNTER — Ambulatory Visit (INDEPENDENT_AMBULATORY_CARE_PROVIDER_SITE_OTHER): Payer: Medicare Other | Admitting: Internal Medicine

## 2021-04-28 VITALS — BP 147/72 | HR 64 | Resp 18 | Ht 74.0 in | Wt 263.3 lb

## 2021-04-28 DIAGNOSIS — G4733 Obstructive sleep apnea (adult) (pediatric): Secondary | ICD-10-CM

## 2021-04-28 DIAGNOSIS — Z7189 Other specified counseling: Secondary | ICD-10-CM

## 2021-04-28 DIAGNOSIS — G4730 Sleep apnea, unspecified: Secondary | ICD-10-CM | POA: Insufficient documentation

## 2021-04-28 DIAGNOSIS — E669 Obesity, unspecified: Secondary | ICD-10-CM | POA: Diagnosis not present

## 2021-04-28 DIAGNOSIS — Z9989 Dependence on other enabling machines and devices: Secondary | ICD-10-CM | POA: Diagnosis not present

## 2021-04-28 DIAGNOSIS — K219 Gastro-esophageal reflux disease without esophagitis: Secondary | ICD-10-CM | POA: Insufficient documentation

## 2021-04-28 DIAGNOSIS — E785 Hyperlipidemia, unspecified: Secondary | ICD-10-CM | POA: Insufficient documentation

## 2021-04-28 DIAGNOSIS — Z6833 Body mass index (BMI) 33.0-33.9, adult: Secondary | ICD-10-CM

## 2021-04-28 NOTE — Patient Instructions (Signed)

## 2021-06-19 ENCOUNTER — Ambulatory Visit (INDEPENDENT_AMBULATORY_CARE_PROVIDER_SITE_OTHER): Payer: Medicare Other | Admitting: Dermatology

## 2021-06-19 ENCOUNTER — Other Ambulatory Visit: Payer: Self-pay

## 2021-06-19 DIAGNOSIS — B353 Tinea pedis: Secondary | ICD-10-CM | POA: Diagnosis not present

## 2021-06-19 DIAGNOSIS — L814 Other melanin hyperpigmentation: Secondary | ICD-10-CM

## 2021-06-19 DIAGNOSIS — Z1283 Encounter for screening for malignant neoplasm of skin: Secondary | ICD-10-CM

## 2021-06-19 DIAGNOSIS — L304 Erythema intertrigo: Secondary | ICD-10-CM

## 2021-06-19 DIAGNOSIS — L309 Dermatitis, unspecified: Secondary | ICD-10-CM

## 2021-06-19 DIAGNOSIS — L081 Erythrasma: Secondary | ICD-10-CM

## 2021-06-19 DIAGNOSIS — L570 Actinic keratosis: Secondary | ICD-10-CM | POA: Diagnosis not present

## 2021-06-19 DIAGNOSIS — D1800 Hemangioma unspecified site: Secondary | ICD-10-CM

## 2021-06-19 DIAGNOSIS — D229 Melanocytic nevi, unspecified: Secondary | ICD-10-CM

## 2021-06-19 DIAGNOSIS — Z85828 Personal history of other malignant neoplasm of skin: Secondary | ICD-10-CM

## 2021-06-19 DIAGNOSIS — L821 Other seborrheic keratosis: Secondary | ICD-10-CM

## 2021-06-19 DIAGNOSIS — L578 Other skin changes due to chronic exposure to nonionizing radiation: Secondary | ICD-10-CM

## 2021-06-19 MED ORDER — CLINDAMYCIN PHOSPHATE 1 % EX SOLN: CUTANEOUS | 3 refills | Status: DC

## 2021-06-19 MED ORDER — TERBINAFINE HCL 250 MG PO TABS: 250.0000 mg | ORAL_TABLET | Freq: Every day | ORAL | 0 refills | Status: DC

## 2021-06-19 NOTE — Patient Instructions (Addendum)

## 2021-06-19 NOTE — Progress Notes (Signed)
New Patient Visit  Subjective  Nicholas Abbott is a 70 y.o. male who presents for the following: Annual Exam (Mole check ). 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  The patient presents for Total-Body Skin Exam (TBSE) for skin cancer screening and mole check.   The following portions of the chart were reviewed this encounter and updated as appropriate:   Tobacco  Allergies  Meds  Problems  Med Hx  Surg Hx  Fam Hx     Review of Systems:  No other skin or systemic complaints except as noted in HPI or Assessment and Plan.  Objective  Well appearing patient in no apparent distress; mood and affect are within normal limits.  A full examination was performed including scalp, head, eyes, ears, nose, lips, neck, chest, axillae, abdomen, back, buttocks, bilateral upper extremities, bilateral lower extremities, hands, feet, fingers, toes, fingernails, and toenails. All findings within normal limits unless otherwise noted below.  axillae, groin Mild erythema and scale   scalp (8) Erythematous thin papules/macules with gritty scale.   feet and groin Scaling and maceration web spaces and over distal and lateral soles.   hands, elbows Scaly erythematous papules and plaques +/- edema and vesiculation.    Assessment & Plan  Erythema intertrigo with erythrasma axillae, groin  Intertrigo is a chronic recurrent rash that occurs in skin fold areas that may be associated with friction; heat; moisture; yeast; fungus; and bacteria.  It is exacerbated by increased movement / activity; sweating; and higher atmospheric temperature.   Start Clindamycin sol apply to arms daily for rash   Related Medications clindamycin (CLEOCIN T) 1 % external solution Apply to axillae and groin once  a day  AK (actinic keratosis) (8) scalp  Destruction of lesion - scalp Complexity: simple   Destruction method: cryotherapy   Informed consent: discussed and consent obtained    Timeout:  patient name, date of birth, surgical site, and procedure verified Lesion destroyed using liquid nitrogen: Yes   Region frozen until ice ball extended beyond lesion: Yes   Outcome: patient tolerated procedure well with no complications   Post-procedure details: wound care instructions given    Tinea pedis of feet -severe feet and groin Tinea pedis  Chronic and persistent  Patient report to history of liver problems Current medication list reviewed   Start Lamisil 250 mg take 1 tablet daily   Related Medications terbinafine (LAMISIL) 250 MG tablet Take 1 tablet (250 mg total) by mouth daily.  Hand dermatitis hands, elbows Hand dermatitis/Psoriasis  No treatment today will treat at follow up visit   Skin cancer screening  Lentigines - Scattered tan macules - Due to sun exposure - Benign-appering, observe - Recommend daily broad spectrum sunscreen SPF 30+ to sun-exposed areas, reapply every 2 hours as needed. - Call for any changes  Seborrheic Keratoses - Stuck-on, waxy, tan-brown papules and/or plaques  - Benign-appearing - Discussed benign etiology and prognosis. - Observe - Call for any changes  Melanocytic Nevi - Tan-brown and/or pink-flesh-colored symmetric macules and papules - Benign appearing on exam today - Observation - Call clinic for new or changing moles - Recommend daily use of broad spectrum spf 30+ sunscreen to sun-exposed areas.   Hemangiomas - Red papules - Discussed benign nature - Observe - Call for any changes  Actinic Damage - Chronic condition, secondary to cumulative UV/sun exposure - diffuse scaly erythematous macules with underlying dyspigmentation - Recommend daily broad spectrum sunscreen SPF 30+ to  sun-exposed areas, reapply every 2 hours as needed.  - Staying in the shade or wearing long sleeves, sun glasses (UVA+UVB protection) and wide brim hats (4-inch brim around the entire circumference of the hat) are also  recommended for sun protection.  - Call for new or changing lesions.  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  - Call if any new or changing lesions are noted between office visits  Skin cancer screening performed today.   Return in about 6 weeks (around 07/31/2021) for Tinea pedis .  IMarye Round, CMA, am acting as scribe for Sarina Ser, MD .  Documentation: I have reviewed the above documentation for accuracy and completeness, and I agree with the above.  Sarina Ser, MD

## 2021-06-23 ENCOUNTER — Encounter: Payer: Self-pay | Admitting: Dermatology

## 2021-07-30 ENCOUNTER — Other Ambulatory Visit: Payer: Self-pay

## 2021-07-30 ENCOUNTER — Ambulatory Visit (INDEPENDENT_AMBULATORY_CARE_PROVIDER_SITE_OTHER): Payer: Medicare Other | Admitting: Dermatology

## 2021-07-30 DIAGNOSIS — L409 Psoriasis, unspecified: Secondary | ICD-10-CM

## 2021-07-30 DIAGNOSIS — B356 Tinea cruris: Secondary | ICD-10-CM

## 2021-07-30 DIAGNOSIS — B351 Tinea unguium: Secondary | ICD-10-CM | POA: Diagnosis not present

## 2021-07-30 DIAGNOSIS — B353 Tinea pedis: Secondary | ICD-10-CM

## 2021-07-30 MED ORDER — TERBINAFINE HCL 250 MG PO TABS: 250.0000 mg | ORAL_TABLET | Freq: Every day | ORAL | 1 refills | Status: DC

## 2021-07-30 MED ORDER — HYDROCORTISONE 2.5 % EX CREA: TOPICAL_CREAM | CUTANEOUS | 1 refills | Status: DC

## 2021-07-30 MED ORDER — KETOCONAZOLE 2 % EX CREA: TOPICAL_CREAM | CUTANEOUS | 1 refills | Status: DC

## 2021-07-30 NOTE — Patient Instructions (Signed)

## 2021-07-30 NOTE — Progress Notes (Signed)
   Follow-Up Visit   Subjective  Nicholas Abbott is a 70 y.o. male who presents for the following: tinea pedis, unguium, and cruris  (S/P one months of Lamisil - patient tolerated medication well with no s/e, but rash in groin has been persistent.).  The following portions of the chart were reviewed this encounter and updated as appropriate:   Tobacco  Allergies  Meds  Problems  Med Hx  Surg Hx  Fam Hx     Review of Systems:  No other skin or systemic complaints except as noted in HPI or Assessment and Plan.  Objective  Well appearing patient in no apparent distress; mood and affect are within normal limits.  A focused examination was performed including the groin, feet, . Relevant physical exam findings are noted in the Assessment and Plan.  B/L feet, hands, and elbows Finger and toenail dystrophy, thick scale of the feet, hands, and elbows.  B/L foot and toenails Thickened scale and nail dystrophy.  Groin Erythema of the groin.   Assessment & Plan  Psoriasis B/L feet, hands, and elbows  Psoriasis is a chronic non-curable, but treatable genetic/hereditary disease that may have other systemic features affecting other organ systems such as joints (Psoriatic Arthritis). It is associated with an increased risk of inflammatory bowel disease, heart disease, non-alcoholic fatty liver disease, and depression.    Plan to treat at follow up once patient has finished Terbinafine treatment.  Tinea pedis of both feet B/L foot and toenails And unguium - slight elevation of AST in the past but normal per patient.  Chronic and persistent.  Tolerating oral Lamisil well. Continue Lamisil 250mg  po QD #30 1RF.   Tinea cruris Groin Persistent and not to goal.   Continue oral Lamisil  start Ketoconazole 2% cream QHS.  Start HC 2.5% cream on M, W, F  ketoconazole (NIZORAL) 2 % cream - Groin Apply to the groin QHS.  hydrocortisone 2.5 % cream - Groin Apply to the groin QD on  Monday, Wednesday, and Friday.  Related Medications terbinafine (LAMISIL) 250 MG tablet Take 1 tablet (250 mg total) by mouth daily.  Return in about 4 months (around 11/29/2021) for follow up - tx psoriasis.  Luther Redo, CMA, am acting as scribe for Sarina Ser, MD . Documentation: I have reviewed the above documentation for accuracy and completeness, and I agree with the above.  Sarina Ser, MD

## 2021-07-31 ENCOUNTER — Encounter: Payer: Self-pay | Admitting: Dermatology

## 2021-09-25 ENCOUNTER — Other Ambulatory Visit: Payer: Self-pay | Admitting: Dermatology

## 2021-09-25 DIAGNOSIS — B353 Tinea pedis: Secondary | ICD-10-CM

## 2021-12-04 ENCOUNTER — Other Ambulatory Visit: Payer: Self-pay

## 2021-12-04 ENCOUNTER — Ambulatory Visit (INDEPENDENT_AMBULATORY_CARE_PROVIDER_SITE_OTHER): Payer: Medicare Other | Admitting: Dermatology

## 2021-12-04 DIAGNOSIS — B356 Tinea cruris: Secondary | ICD-10-CM | POA: Diagnosis not present

## 2021-12-04 DIAGNOSIS — L409 Psoriasis, unspecified: Secondary | ICD-10-CM | POA: Diagnosis not present

## 2021-12-04 DIAGNOSIS — B353 Tinea pedis: Secondary | ICD-10-CM | POA: Diagnosis not present

## 2021-12-04 MED ORDER — TERBINAFINE HCL 250 MG PO TABS: 250.0000 mg | ORAL_TABLET | Freq: Every day | ORAL | 0 refills | Status: DC

## 2021-12-04 NOTE — Progress Notes (Incomplete)
° °  Follow-Up Visit   Subjective  Nicholas Abbott is a 71 y.o. male who presents for the following: Follow-up (4 months f/u tinea in the groin area treated with Lamisil tablets x 2 months then stopped, currently using Ketoconazole cream daily  with a good response ).    The following portions of the chart were reviewed this encounter and updated as appropriate:       Review of Systems:  No other skin or systemic complaints except as noted in HPI or Assessment and Plan.  Objective  Well appearing patient in no apparent distress; mood and affect are within normal limits.  A focused examination was performed including feet,groin,hands. Relevant physical exam findings are noted in the Assessment and Plan.  groin Erythema of the groin improved  feet, hands, and elbows. Finger and toenail dystrophy, thick scale of the feet, hands, and elbows.  B/L foot and toenails Thickened scale and nail dystrophy.    Assessment & Plan  Tinea cruris groin  Tinea cruris-improved  Persistent and not to goal   Continue oral Lamisil for 1 month then stop  start Ketoconazole 2% cream QHS for 1 month then stop    Related Medications ketoconazole (NIZORAL) 2 % cream Apply to the groin QHS.  terbinafine (LAMISIL) 250 MG tablet Take 1 tablet (250 mg total) by mouth daily.  Psoriasis feet, hands, and elbows.  Psoriasis is a chronic non-curable, but treatable genetic/hereditary disease that may have other systemic features affecting other organ systems such as joints (Psoriatic Arthritis). It is associated with an increased risk of inflammatory bowel disease, heart disease, non-alcoholic fatty liver disease, and depression.     Samples of Vtama cream use once a day  Pt decline a rx for psoriasis   Tinea pedis of both feet B/L foot and toenails  Chronic and persistent.  Tolerating oral Lamisil well. Continue Lamisil 250mg  po QD #30 0RF  Related Medications terbinafine (LAMISIL) 250 MG  tablet Take 1 tablet (250 mg total) by mouth daily.   Return in about 1 year (around 12/04/2022) for Tinea pedis, tinea cruris, Psoriasis.  IMarye Round, CMA, am acting as scribe for Sarina Ser, MD .

## 2021-12-04 NOTE — Progress Notes (Signed)
° °  Follow-Up Visit   Subjective  Nicholas Abbott is a 71 y.o. male who presents for the following: Follow-up (4 months f/u tinea in the groin area treated with Lamisil tablets x 2 months then stopped, currently using Ketoconazole cream daily  with a good response ). Pt also with Psoriasis - currently no treatment. Recent worsening.  The following portions of the chart were reviewed this encounter and updated as appropriate:   Tobacco   Allergies   Meds   Problems   Med Hx   Surg Hx   Fam Hx      Review of Systems:  No other skin or systemic complaints except as noted in HPI or Assessment and Plan.  Objective  Well appearing patient in no apparent distress; mood and affect are within normal limits.  A focused examination was performed including groin,feet. Relevant physical exam findings are noted in the Assessment and Plan.  groin Erythema of the groin improved  feet, hands, and elbows. Finger and toenail dystrophy, thick scale of the feet, hands, and elbows.  B/L foot and toenails Thickened scale and nail dystrophy.   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  Tolerating oral Lamisil well. Continue Lamisil 250mg  po QD #30 0RF Persistent and not to goal  but improving. Continue oral Lamisil for 1 month then stop  start Ketoconazole 2% cream QHS for 1 month then stop   Related Medications ketoconazole (NIZORAL) 2 % cream Apply to the groin QHS.  terbinafine (LAMISIL) 250 MG tablet Take 1 tablet (250 mg total) by mouth daily.  Psoriasis feet, hands, and elbows. Psoriasis is a chronic non-curable, but treatable genetic/hereditary disease that may have other systemic features affecting other organ systems such as joints (Psoriatic Arthritis). It is associated with an increased risk of inflammatory bowel disease, heart disease, non-alcoholic fatty liver disease, and depression.     Samples of Vtama cream  use once a day  Pt declines a rx for psoriasis   Return in about 1 year (around 12/04/2022) for Tinea pedis, tinea cruris, Psoriasis.  IMarye Round, CMA, am acting as scribe for Sarina Ser, MD .  Documentation: I have reviewed the above documentation for accuracy and completeness, and I agree with the above.  Sarina Ser, MD

## 2021-12-04 NOTE — Patient Instructions (Signed)

## 2021-12-05 ENCOUNTER — Telehealth: Payer: Self-pay

## 2021-12-05 NOTE — Telephone Encounter (Signed)
Pharmacy states insurance will cover Terbinafine 250mg  po QD for a total quantity of 90 per 365 days, and they are requesting an alternative. Please advise.

## 2021-12-07 ENCOUNTER — Encounter: Payer: Self-pay | Admitting: Dermatology

## 2021-12-09 NOTE — Telephone Encounter (Signed)
Patient informed of insurance restrictions. He prefers to wait until August if needed then. He states he is doing fine right now.

## 2022-02-11 ENCOUNTER — Other Ambulatory Visit: Payer: Self-pay | Admitting: Internal Medicine

## 2022-02-11 DIAGNOSIS — E782 Mixed hyperlipidemia: Secondary | ICD-10-CM

## 2022-02-11 DIAGNOSIS — K7400 Hepatic fibrosis, unspecified: Secondary | ICD-10-CM

## 2022-02-17 ENCOUNTER — Ambulatory Visit
Admission: RE | Admit: 2022-02-17 | Discharge: 2022-02-17 | Disposition: A | Discharge status: Home / Self Care | Payer: Medicare Other | Source: Ambulatory Visit | Attending: Internal Medicine | Admitting: Internal Medicine

## 2022-02-17 DIAGNOSIS — K7400 Hepatic fibrosis, unspecified: Secondary | ICD-10-CM | POA: Insufficient documentation

## 2022-02-17 DIAGNOSIS — E782 Mixed hyperlipidemia: Secondary | ICD-10-CM | POA: Insufficient documentation

## 2022-02-24 ENCOUNTER — Other Ambulatory Visit: Payer: Self-pay | Admitting: Dermatology

## 2022-02-24 DIAGNOSIS — B356 Tinea cruris: Secondary | ICD-10-CM

## 2022-04-27 ENCOUNTER — Ambulatory Visit (INDEPENDENT_AMBULATORY_CARE_PROVIDER_SITE_OTHER): Payer: Medicare Other | Admitting: Internal Medicine

## 2022-04-27 VITALS — BP 134/74 | HR 43 | Resp 16 | Ht 74.0 in | Wt 266.8 lb

## 2022-04-27 DIAGNOSIS — G4733 Obstructive sleep apnea (adult) (pediatric): Secondary | ICD-10-CM | POA: Diagnosis not present

## 2022-04-27 DIAGNOSIS — Z7189 Other specified counseling: Secondary | ICD-10-CM

## 2022-04-27 DIAGNOSIS — I10 Essential (primary) hypertension: Secondary | ICD-10-CM | POA: Diagnosis not present

## 2022-04-27 DIAGNOSIS — Z9989 Dependence on other enabling machines and devices: Secondary | ICD-10-CM | POA: Diagnosis not present

## 2022-04-27 NOTE — Progress Notes (Signed)
[facility] [place], [place] [address]  Pulmonary Sleep Medicine   Office Visit Note  Patient Name: Nicholas Abbott DOB: 04/20/1951 MRN [id number]    Chief Complaint: Obstructive Sleep Apnea visit  Brief History:  Fard is seen today for a one year follow up on CPAP'@10cmh20'$ .  The patient has a 5 year history of sleep apnea. Patient is using PAP nightly.  The patient feels somewhat rested after sleeping with PAP. The patient reports benefit from PAP use. Reported sleepiness is  improved and the Epworth Sleepiness Score is 1 out of 24. The patient does not take naps. The patient complains of the following: some oral dryness very occasionally. Struggles sometimes when he changes positions in getting the mask repositioned.  The compliance download shows 98% compliance with an average use time of 8  hours 38 min.  The AHI is 0.5  The patient does not complain of limb movements disrupting sleep.  ROS  General: (-) fever, (-) chills, (-) night sweat Nose and Sinuses: (-) nasal stuffiness or itchiness, (-) postnasal drip, (-) nosebleeds, (-) sinus trouble. Mouth and Throat: (-) sore throat, (-) hoarseness. Neck: (-) swollen glands, (-) enlarged thyroid, (-) neck pain. Respiratory: - cough, - shortness of breath, - wheezing. Neurologic: - numbness, - tingling. Psychiatric: - anxiety, - depression   Current Medication: Outpatient Encounter Medications as of 04/27/2022  Medication Sig   aspirin EC 81 MG tablet Take 81 mg by mouth daily.   Cinnamon 500 MG capsule Take 1,000 mg by mouth 2 (two) times daily as needed (blood sugar control).   Cinnamon 500 MG capsule Take by mouth.   clindamycin (CLEOCIN T) 1 % external solution Apply to axillae and groin once  a day   Cyanocobalamin (VITAMIN B-12) 5000 MCG TBDP Take 5,000 mcg by mouth daily.    cyanocobalamin 1000 MCG tablet Take by mouth.   folic acid-pyridoxine-cyancobalamin (FOLTX) 2.5-25-2 MG TABS tablet Take 1  tablet by mouth daily.   furosemide (LASIX) 40 MG tablet Take 40 mg by mouth daily.   JARDIANCE 10 MG TABS tablet Take 10 mg by mouth every morning.   ketoconazole (NIZORAL) 2 % cream APPLY TO THE GROIN AT BEDTIME   levothyroxine (SYNTHROID) 100 MCG tablet Take 1 tablet by mouth daily.   levothyroxine (SYNTHROID) 75 MCG tablet Take 75 mcg by mouth daily.    metFORMIN (GLUCOPHAGE-XR) 500 MG 24 hr tablet Take 500 mg by mouth 2 (two) times daily with a meal.   ramipril (ALTACE) 2.5 MG capsule Take 2.5 mg by mouth daily.   rosuvastatin (CRESTOR) 20 MG tablet Take by mouth.   sildenafil (REVATIO) 20 MG tablet Take 20 mg by mouth daily as needed.    sildenafil (REVATIO) 20 MG tablet Take by mouth.   simvastatin (ZOCOR) 40 MG tablet Take 40 mg by mouth at bedtime.    terbinafine (LAMISIL) 250 MG tablet Take 1 tablet (250 mg total) by mouth daily.   traMADol (ULTRAM) 50 MG tablet    No facility-administered encounter medications on file as of 04/27/2022.    Surgical History: Past Surgical History:  Procedure Laterality Date   CATARACT EXTRACTION     COLONOSCOPY WITH PROPOFOL N/A 01/01/2020   Procedure: COLONOSCOPY WITH PROPOFOL;  Surgeon: [name], Benay Pike, MD;  Location: [facility] ENDOSCOPY;  Service: Gastroenterology;  Laterality: N/A;   CORONARY ANGIOPLASTY     ESOPHAGOGASTRODUODENOSCOPY (EGD) WITH PROPOFOL N/A 01/01/2020   Procedure: ESOPHAGOGASTRODUODENOSCOPY (EGD) WITH PROPOFOL;  Surgeon: [place], Providence  K, MD;  Location: [facility] ENDOSCOPY;  Service: Gastroenterology;  Laterality: N/A;   EYE SURGERY     RETINAL DETACHMENT SURGERY      Medical History: Past Medical History:  Diagnosis Date   Diabetes mellitus without complication (HCC)    GERD (gastroesophageal reflux disease)    Hyperlipidemia    Hypertension    Left shoulder pain    Myocardial infarction ([place])     Family History: Non contributory to the present illness  Social History: Social History   Socioeconomic History    Marital status: Married    Spouse name: Not on file   Number of children: Not on file   Years of education: Not on file   Highest education level: Not on file  Occupational History   Not on file  Tobacco Use   Smoking status: Never   Smokeless tobacco: Never  Vaping Use   Vaping Use: Never used  Substance and Sexual Activity   Alcohol use: Yes    Alcohol/week: 5.0 standard drinks of alcohol    Types: 5 Shots of liquor per week   Drug use: Never   Sexual activity: Yes    Birth control/protection: Post-menopausal  Other Topics Concern   Not on file  Social History Narrative   Not on file   Social Determinants of Health   Financial Resource Strain: Not on file  Food Insecurity: Not on file  Transportation Needs: Not on file  Physical Activity: Not on file  Stress: Not on file  Social Connections: Not on file  Intimate Partner Violence: Not on file    Vital Signs: There were no vitals taken for this visit. There is no height or weight on file to calculate BMI.    Examination: General Appearance: The patient is well-developed, well-nourished, and in no distress. Neck Circumference: 46cm Skin: Gross inspection of skin unremarkable. Head: normocephalic, no gross deformities. Eyes: no gross deformities noted. ENT: ears appear grossly normal Neurologic: Alert and oriented. No involuntary movements.    EPWORTH SLEEPINESS SCALE:  Scale:  (0)= no chance of dozing; (1)= slight chance of dozing; (2)= moderate chance of dozing; (3)= high chance of dozing  Chance  Situtation    Sitting and reading: 0    Watching TV: 0    Sitting Inactive in public: 0    As a passenger in car: 0      Lying down to rest: 1    Sitting and talking: 0    Sitting quielty after lunch: 0    In a car, stopped in traffic: 0   TOTAL SCORE:   1 out of 24    SLEEP STUDIES:  PSG 03/29/17 - AHI 73, low SpO57mn 82%   CPAP COMPLIANCE DATA:  Date Range:  04/24/2021-04/23/2022  Average Daily Use: 8 hours  49 min  Median Use: 8 hrs 53 min  Compliance for > 4 Hours: 98% days  AHI: 0.5 respiratory events per hour  Days Used: 358/365  Mask Leak: 18.6  95th Percentile Pressure: 10         LABS: No results found for this or any previous visit (from the past 2160 hour(s)).  Radiology: [place]ABDOMEN COMPLETE W/ELASTOGRAPHY  Result Date: 02/17/2022 CLINICAL DATA:  Cirrhosis, hepatic fibrosis.  Mixed hyperlipidemia EXAM: ULTRASOUND ABDOMEN ULTRASOUND HEPATIC ELASTOGRAPHY TECHNIQUE: Sonography of the upper abdomen was performed. In addition, ultrasound elastography evaluation of the liver was performed. A region of interest was placed within the right lobe of the liver. Following application of  a compressive sonographic pulse, tissue compressibility was assessed. Multiple assessments were performed at the selected site. Median tissue compressibility was determined. Previously, hepatic stiffness was assessed by shear wave velocity. Based on recently published Society of Radiologists in Ultrasound consensus article, reporting is now recommended to be performed in the SI units of pressure (kiloPascals) representing hepatic stiffness/elasticity. The obtained result is compared to the published reference standards. (cACLD = compensated Advanced Chronic Liver Disease) COMPARISON:  08/30/2019 FINDINGS: ULTRASOUND ABDOMEN Gallbladder: 13 mm stone in the gallbladder neck. No wall thickening or sonographic Murphy sign. Common bile duct: Diameter: Normal caliber, 6 mm. Liver: No focal lesion identified. Within normal limits in parenchymal echogenicity. Portal vein is patent on color Doppler imaging with normal direction of blood flow towards the liver. IVC: No abnormality visualized. Pancreas: Not well visualized. Spleen: Enlarged with a craniocaudal length of 13 cm and a splenic volume of 688 mL. Scattered calcifications compatible with old granulomatous disease.  Right Kidney: Length: 15.5 cm. 7.3 cm cyst appears simple. Normal echotexture. No hydronephrosis. Left Kidney: Length: 15.0 cm. 1.6 cm cyst in the midpole appears simple. Normal echotexture. No hydronephrosis. Abdominal aorta: No aneurysm visualized. Other findings: Recanalized umbilical vein noted compatible with portal venous hypertension. ULTRASOUND HEPATIC ELASTOGRAPHY Device: Siemens Helix VTQ Patient position: Supine Transducer DAX Number of measurements: 10 Hepatic segment:  8 Median kPa: 6.6 IQR: 3.6 IQR/Median kPa ratio: 0.6 Data quality: IQR/Median kPa ratio of 0.3 or greater indicates reduced accuracy Diagnostic category: < or = 9 kPa: in the absence of other known clinical signs, rules out cACLD The use of hepatic elastography is applicable to patients with viral hepatitis and non-alcoholic fatty liver disease. At this time, there is insufficient data for the referenced cut-off values and use in other causes of liver disease, including alcoholic liver disease. Patients, however, may be assessed by elastography and serve as their own reference standard/baseline. In patients with non-alcoholic liver disease, the values suggesting compensated advanced chronic liver disease (cACLD) may be lower, and patients may need additional testing with elasticity results of 7-9 kPa. Please note that abnormal hepatic elasticity and shear wave velocities may also be identified in clinical settings other than with hepatic fibrosis, such as: acute hepatitis, elevated right heart and central venous pressures including use of beta blockers, veno-occlusive disease (Budd-Chiari), infiltrative processes such as mastocytosis/amyloidosis/infiltrative tumor/lymphoma, extrahepatic cholestasis, with hyperemia in the post-prandial state, and with liver transplantation. Correlation with patient history, laboratory data, and clinical condition recommended. Diagnostic Categories: < or =5 kPa: high probability of being normal < or =9 kPa:  in the absence of other known clinical signs, rules out cACLD >9 kPa and ?13 kPa: suggestive of cACLD, but needs further testing >13 kPa: highly suggestive of cACLD > or =17 kPa: highly suggestive of cACLD with an increased probability of clinically significant portal hypertension IMPRESSION: ULTRASOUND ABDOMEN: Splenomegaly.  Recanalized umbilical vein. Cholelithiasis. Bilateral renal cysts. ULTRASOUND HEPATIC ELASTOGRAPHY: Median kPa:  6.6 Diagnostic category: < or = 9 kPa: in the absence of other known clinical signs, rules out cACLD Electronically Signed   By: Rolm Baptise M.D.   On: 02/17/2022 16:27    No results found.  No results found.    Assessment and Plan: Patient Active Problem List   Diagnosis Date Noted   GERD (gastroesophageal reflux disease) 04/28/2021   Sleep apnea 04/28/2021   Hyperlipidemia 04/28/2021   Cellulitis of right leg 01/28/2020   Cellulitis 11/03/2019   Hyperlipidemia    Hypertension    Diabetes  mellitus without complication (HCC)    CAD (coronary artery disease)    Thrombocytopenia (HCC)    Acute metabolic encephalopathy    Hypoglycemia    Sepsis ([place])    Cellulitis of right lower extremity    Type 2 diabetes mellitus without complication, without long-term current use of insulin ([place]) 02/11/2017   Coronary artery disease 06/06/2014   Hypertension 06/06/2014   Obesity 05/30/2012   Myocardial infarction ([place]) 03/02/2000  1. OSA on CPAP The patient does tolerate PAP and reports  benefit from PAP use. The patient was reminded how to clean equipment and advised to replace supplies routinely. The patient was also counselled on weight loss. The compliance is excellent. The AHI is 0.5.   OSA on cpap- CPAP continues to be medically necessary to treat this patient's OSA.  Continue with excellent compliance with pap. F/u one year.   2. CPAP use counseling CPAP Counseling: had a lengthy discussion with the patient regarding the importance of PAP therapy in  management of the sleep apnea. Patient appears to understand the risk factor reduction and also understands the risks associated with untreated sleep apnea. Patient will try to make a good faith effort to remain compliant with therapy. Also instructed the patient on proper cleaning of the device including the water must be changed daily if possible and use of distilled water is preferred. Patient understands that the machine should be regularly cleaned with appropriate recommended cleaning solutions that do not damage the PAP machine for example given white vinegar and water rinses. Other methods such as ozone treatment may not be as good as these simple methods to achieve cleaning.   3. Hypertension, unspecified type Hypertension Counseling:   The following hypertensive lifestyle modification were recommended and discussed:  1. Limiting alcohol intake to less than 1 oz/day of ethanol:(24 oz of beer or 8 oz of wine or 2 oz of 100-proof whiskey). 2. Take baby ASA 81 mg daily. 3. Importance of regular aerobic exercise and losing weight. 4. Reduce dietary saturated fat and cholesterol intake for overall cardiovascular health. 5. Maintaining adequate dietary potassium, calcium, and magnesium intake. 6. Regular monitoring of the blood pressure. 7. Reduce sodium intake to less than 100 mmol/day (less than 2.3 gm of sodium or less than 6 gm of sodium choride)      General Counseling: I have discussed the findings of the evaluation and examination with Lynann Bologna.  I have also discussed any further diagnostic evaluation thatmay be needed or ordered today. Chinonso verbalizes understanding of the findings of todays visit. We also reviewed his medications today and discussed drug interactions and side effects including but not limited excessive drowsiness and altered mental states. We also discussed that there is always a risk not just to him but also people around him. he has been encouraged to call the office with any  questions or concerns that should arise related to todays visit.  No orders of the defined types were placed in this encounter.       I have personally obtained a history, examined the patient, evaluated laboratory and imaging results, formulated the assessment and plan and placed orders. This patient was seen today by Tressie Ellis, PA-C in collaboration with Dr. Devona Konig.   Allyne Gee, MD [facility] Diplomate ABMS Pulmonary Critical Care Medicine and Sleep Medicine

## 2022-04-29 ENCOUNTER — Encounter
Admission: RE | Disposition: A | Discharge status: Home / Self Care | Payer: Self-pay | Source: Ambulatory Visit | Attending: Cardiology

## 2022-04-29 ENCOUNTER — Encounter: Payer: Self-pay | Admitting: Cardiology

## 2022-04-29 ENCOUNTER — Ambulatory Visit
Admission: RE | Admit: 2022-04-29 | Discharge: 2022-04-29 | Disposition: A | Discharge status: Home / Self Care | Payer: Medicare Other | Source: Ambulatory Visit | Attending: Cardiology | Admitting: Cardiology

## 2022-04-29 ENCOUNTER — Other Ambulatory Visit: Payer: Self-pay

## 2022-04-29 DIAGNOSIS — Y832 Surgical operation with anastomosis, bypass or graft as the cause of abnormal reaction of the patient, or of later complication, without mention of misadventure at the time of the procedure: Secondary | ICD-10-CM | POA: Diagnosis not present

## 2022-04-29 DIAGNOSIS — Z955 Presence of coronary angioplasty implant and graft: Secondary | ICD-10-CM | POA: Diagnosis not present

## 2022-04-29 DIAGNOSIS — I252 Old myocardial infarction: Secondary | ICD-10-CM | POA: Diagnosis not present

## 2022-04-29 DIAGNOSIS — Z7984 Long term (current) use of oral hypoglycemic drugs: Secondary | ICD-10-CM | POA: Diagnosis not present

## 2022-04-29 DIAGNOSIS — T82855A Stenosis of coronary artery stent, initial encounter: Secondary | ICD-10-CM | POA: Diagnosis not present

## 2022-04-29 DIAGNOSIS — E785 Hyperlipidemia, unspecified: Secondary | ICD-10-CM | POA: Diagnosis not present

## 2022-04-29 DIAGNOSIS — I10 Essential (primary) hypertension: Secondary | ICD-10-CM | POA: Insufficient documentation

## 2022-04-29 DIAGNOSIS — I2510 Atherosclerotic heart disease of native coronary artery without angina pectoris: Secondary | ICD-10-CM | POA: Insufficient documentation

## 2022-04-29 DIAGNOSIS — I2584 Coronary atherosclerosis due to calcified coronary lesion: Secondary | ICD-10-CM | POA: Diagnosis not present

## 2022-04-29 DIAGNOSIS — R9431 Abnormal electrocardiogram [ECG] [EKG]: Secondary | ICD-10-CM | POA: Diagnosis present

## 2022-04-29 DIAGNOSIS — E119 Type 2 diabetes mellitus without complications: Secondary | ICD-10-CM | POA: Insufficient documentation

## 2022-04-29 DIAGNOSIS — R9430 Abnormal result of cardiovascular function study, unspecified: Secondary | ICD-10-CM

## 2022-04-29 HISTORY — PX: LEFT HEART CATH AND CORONARY ANGIOGRAPHY: CATH118249

## 2022-04-29 LAB — GLUCOSE, CAPILLARY: Glucose-Capillary: 112 mg/dL — ABNORMAL HIGH (ref 70–99)

## 2022-04-29 SURGERY — LEFT HEART CATH AND CORONARY ANGIOGRAPHY
Anesthesia: Moderate Sedation

## 2022-04-29 MED ORDER — ASPIRIN 81 MG PO CHEW
CHEWABLE_TABLET | ORAL | Status: AC
  Administered 2022-04-29: 324 mg via ORAL
  Filled 2022-04-29: qty 4

## 2022-04-29 MED ORDER — HEPARIN SODIUM (PORCINE) 1000 UNIT/ML IJ SOLN
INTRAMUSCULAR | Status: AC
  Filled 2022-04-29: qty 10

## 2022-04-29 MED ORDER — CLOPIDOGREL BISULFATE 75 MG PO TABS
ORAL_TABLET | ORAL | Status: AC
  Administered 2022-04-29: 300 mg via ORAL
  Filled 2022-04-29: qty 4

## 2022-04-29 MED ORDER — VERAPAMIL HCL 2.5 MG/ML IV SOLN
INTRAVENOUS | Status: AC
  Filled 2022-04-29: qty 2

## 2022-04-29 MED ORDER — ASPIRIN 81 MG PO CHEW: 324.0000 mg | CHEWABLE_TABLET | ORAL | Status: AC

## 2022-04-29 MED ORDER — ONDANSETRON HCL 4 MG/2ML IJ SOLN: 4.0000 mg | Freq: Four times a day (QID) | INTRAMUSCULAR | Status: DC | PRN

## 2022-04-29 MED ORDER — SODIUM CHLORIDE 0.9 % IV SOLN: 250.0000 mL | INTRAVENOUS | Status: DC | PRN

## 2022-04-29 MED ORDER — HEPARIN SODIUM (PORCINE) 1000 UNIT/ML IJ SOLN
INTRAMUSCULAR | Status: DC | PRN
  Administered 2022-04-29: 5000 [IU] via INTRAVENOUS

## 2022-04-29 MED ORDER — METOPROLOL SUCCINATE ER 25 MG PO TB24
12.5000 mg | ORAL_TABLET | Freq: Every day | ORAL | 11 refills | Status: DC
Start: 2022-04-29 — End: 2024-06-21

## 2022-04-29 MED ORDER — IOHEXOL 300 MG/ML  SOLN
INTRAMUSCULAR | Status: DC | PRN
  Administered 2022-04-29: 65 mL

## 2022-04-29 MED ORDER — SODIUM CHLORIDE 0.9 % IV SOLN: INTRAVENOUS | Status: DC

## 2022-04-29 MED ORDER — MIDAZOLAM HCL 2 MG/2ML IJ SOLN
INTRAMUSCULAR | Status: DC | PRN
  Administered 2022-04-29: 1 mg via INTRAVENOUS

## 2022-04-29 MED ORDER — HEPARIN (PORCINE) IN NACL 1000-0.9 UT/500ML-% IV SOLN
INTRAVENOUS | Status: AC
  Filled 2022-04-29: qty 1000

## 2022-04-29 MED ORDER — VERAPAMIL HCL 2.5 MG/ML IV SOLN
INTRAVENOUS | Status: DC | PRN
  Administered 2022-04-29: 2.5 mg via INTRA_ARTERIAL

## 2022-04-29 MED ORDER — LIDOCAINE HCL (PF) 1 % IJ SOLN
INTRAMUSCULAR | Status: DC | PRN
  Administered 2022-04-29: 2 mL

## 2022-04-29 MED ORDER — SODIUM CHLORIDE 0.9% FLUSH: 3.0000 mL | Freq: Two times a day (BID) | INTRAVENOUS | Status: DC

## 2022-04-29 MED ORDER — SODIUM CHLORIDE 0.9% FLUSH: 3.0000 mL | INTRAVENOUS | Status: DC | PRN

## 2022-04-29 MED ORDER — LIDOCAINE HCL 1 % IJ SOLN
INTRAMUSCULAR | Status: AC
  Filled 2022-04-29: qty 20

## 2022-04-29 MED ORDER — CLOPIDOGREL BISULFATE 75 MG PO TABS: 75.0000 mg | ORAL_TABLET | Freq: Every day | ORAL | 11 refills | Status: AC

## 2022-04-29 MED ORDER — MIDAZOLAM HCL 2 MG/2ML IJ SOLN
INTRAMUSCULAR | Status: AC
  Filled 2022-04-29: qty 2

## 2022-04-29 MED ORDER — CLOPIDOGREL BISULFATE 300 MG PO TABS: 300.0000 mg | ORAL_TABLET | Freq: Once | ORAL | Status: AC

## 2022-04-29 MED ORDER — SODIUM CHLORIDE 0.9 % IV SOLN: INTRAVENOUS | Status: AC

## 2022-04-29 MED ORDER — FENTANYL CITRATE (PF) 100 MCG/2ML IJ SOLN
INTRAMUSCULAR | Status: AC
  Filled 2022-04-29: qty 2

## 2022-04-29 MED ORDER — ACETAMINOPHEN 325 MG PO TABS: 650.0000 mg | ORAL_TABLET | ORAL | Status: DC | PRN

## 2022-04-29 MED ORDER — FENTANYL CITRATE (PF) 100 MCG/2ML IJ SOLN
INTRAMUSCULAR | Status: DC | PRN
  Administered 2022-04-29: 25 ug via INTRAVENOUS

## 2022-04-29 MED ORDER — HEPARIN (PORCINE) IN NACL 2000-0.9 UNIT/L-% IV SOLN
INTRAVENOUS | Status: DC | PRN
  Administered 2022-04-29: 1000 mL

## 2022-04-29 SURGICAL SUPPLY — 11 items
BAND ZEPHYR COMPRESS 30 LONG (HEMOSTASIS) ×1 IMPLANT
CATH 5FR JL3.5 JR4 ANG PIG MP (CATHETERS) ×1 IMPLANT
DRAPE BRACHIAL (DRAPES) ×1 IMPLANT
GLIDESHEATH SLEND SS 6F .021 (SHEATH) ×1 IMPLANT
GUIDEWIRE INQWIRE 1.5J.035X260 (WIRE) IMPLANT
INQWIRE 1.5J .035X260CM (WIRE) ×2
KIT SYRINGE INJ CVI SPIKEX1 (MISCELLANEOUS) ×1 IMPLANT
PACK CARDIAC CATH (CUSTOM PROCEDURE TRAY) ×2 IMPLANT
PROTECTION STATION PRESSURIZED (MISCELLANEOUS) ×2
SET ATX SIMPLICITY (MISCELLANEOUS) ×1 IMPLANT
STATION PROTECTION PRESSURIZED (MISCELLANEOUS) IMPLANT

## 2022-04-29 NOTE — H&P (Signed)
[facility] Cardiology History and Physical  Patient ID: Nicholas Abbott MRN: [id number] DOB/AGE: February 07, 1951 71 y.o. Admit date: 04/29/2022  Primary Care Physician: Gladstone Lighter, MD Primary Cardiologist Bartholome Bill  HPI:   [name] is a 71 y.o.male with a history of CAD s/p PCI to LAD 2003, hypertension, hyperlipidemia, type 2 diabetes who was referred urgently to cardiology for high risk stress test.  He says that ever sense some recent hospitalization with cellulitis a few years ago he has slowed down significantly. He has not had endurance recently, and decreased mobility. He has not had chest pain, though he didn't have any with his prior MI. He does have neck pain/ jaw pain when he heavily exerts himself. This always resolves with stopping. He does have shortness of breath with exertion. He can climb stairs ok, but more heavy exertion is an issue. Ultimately did not have any acute complaints prompting his stress test, but was due for his every 3-year stress test which she completed today. Stress test was high risk and that there were predominantly dyskinesis in multiple vascular territories and frequent multiform PVCs with exertion.   Past Medical History:  Diagnosis Date   Diabetes mellitus without complication (HCC)    GERD (gastroesophageal reflux disease)    Hyperlipidemia    Hypertension    Left shoulder pain    Myocardial infarction [facility])     Past Surgical History:  Procedure Laterality Date   CATARACT EXTRACTION     COLONOSCOPY WITH PROPOFOL N/A 01/01/2020   Procedure: COLONOSCOPY WITH PROPOFOL;  Surgeon: [name], Benay Pike, MD;  Location: [facility] ENDOSCOPY;  Service: Gastroenterology;  Laterality: N/A;   CORONARY ANGIOPLASTY     ESOPHAGOGASTRODUODENOSCOPY (EGD) WITH PROPOFOL N/A 01/01/2020   Procedure: ESOPHAGOGASTRODUODENOSCOPY (EGD) WITH PROPOFOL;  Surgeon: [name], Benay Pike, MD;  Location: [facility] ENDOSCOPY;  Service: Gastroenterology;  Laterality: N/A;   EYE SURGERY      RETINAL DETACHMENT SURGERY      Medications Prior to Admission  Medication Sig Dispense Refill Last Dose   aspirin EC 81 MG tablet Take 81 mg by mouth daily.   04/28/2022   Cinnamon 500 MG capsule Take 1,000 mg by mouth 2 (two) times daily as needed (blood sugar control).   04/28/2022   Cyanocobalamin (VITAMIN B-12) 5000 MCG TBDP Take 5,000 mcg by mouth daily.    04/28/2022   empagliflozin (JARDIANCE) 10 MG TABS tablet Take 1 tablet by mouth daily with breakfast.   2/87/8676   folic acid-pyridoxine-cyancobalamin (FOLTX) 2.5-25-2 MG TABS tablet Take 1 tablet by mouth daily.   04/28/2022   ketoconazole (NIZORAL) 2 % cream APPLY TO THE GROIN AT BEDTIME 60 g 1 04/28/2022   levothyroxine (SYNTHROID) 112 MCG tablet Take 112 mcg by mouth daily.   04/28/2022   metoprolol succinate (TOPROL-XL) 25 MG 24 hr tablet Take 1 tablet by mouth daily.   04/28/2022   ramipril (ALTACE) 2.5 MG capsule Take 2.5 mg by mouth daily.   04/28/2022   rosuvastatin (CRESTOR) 40 MG tablet Take by mouth.   04/28/2022   tadalafil (CIALIS) 5 MG tablet Take by mouth.   04/28/2022   terbinafine (LAMISIL) 250 MG tablet Take 1 tablet (250 mg total) by mouth daily. 30 tablet 0 04/28/2022   traMADol (ULTRAM) 50 MG tablet    Past Month   rosuvastatin (CRESTOR) 20 MG tablet Take by mouth.      sildenafil (REVATIO) 20 MG tablet Take by mouth.      Social History   Socioeconomic  History   Marital status: Married    Spouse name: Not on file   Number of children: Not on file   Years of education: Not on file   Highest education level: Not on file  Occupational History   Not on file  Tobacco Use   Smoking status: Never   Smokeless tobacco: Never  Vaping Use   Vaping Use: Never used  Substance and Sexual Activity   Alcohol use: Yes    Alcohol/week: 5.0 standard drinks of alcohol    Types: 5 Shots of liquor per week   Drug use: Never   Sexual activity: Yes    Birth control/protection: Post-menopausal  Other Topics Concern   Not on  file  Social History Narrative   Not on file   Social Determinants of Health   Financial Resource Strain: Not on file  Food Insecurity: Not on file  Transportation Needs: Not on file  Physical Activity: Not on file  Stress: Not on file  Social Connections: Not on file  Intimate Partner Violence: Not on file    Family History  Problem Relation Age of Onset   Leukemia Father       Review of systems complete and found to be negative unless listed above      Physical Exam:  General: Well developed, well nourished, in no acute distress HEENT:  Normocephalic and atramatic Neck:  No JVD.  Lungs: Clear bilaterally to auscultation and percussion. Heart: HRRR . Normal S1 and S2 without gallops or murmurs.  Abdomen: Bowel sounds are positive, abdomen soft and non-tender  Msk:  Back normal, normal gait. Normal strength and tone for age. Extremities: No clubbing, cyanosis or edema.   Neuro: Alert and oriented X 3. Psych:  Good affect, responds appropriately   Labs:   Lab Results  Component Value Date   WBC 7.2 01/31/2020   HGB 13.8 01/31/2020   HCT 39.5 01/31/2020   MCV 102.3 (H) 01/31/2020   PLT 104 (L) 01/31/2020   No results for input(s): "NA", "K", "CL", "CO2", "BUN", "CREATININE", "CALCIUM", "PROT", "BILITOT", "ALKPHOS", "ALT", "AST", "GLUCOSE" in the last 168 hours.  Invalid input(s): "LABALBU" No results found for: "CKTOTAL", "CKMB", "CKMBINDEX", "TROPONINI" No results found for: "CHOL" No results found for: "HDL" No results found for: "LDLCALC" No results found for: "TRIG" No results found for: "CHOLHDL" No results found for: "LDLDIRECT"    Radiology: No results found.  EKG: Sinus brady with PVCs. Poor R wave progression  ASSESSMENT AND PLAN:  71 year old male with history of type 2 diabetes, CAD s/p PCI to LAD in 2003 who has been experiencing shortness of breath and jaw pain with exertion and underwent stress test which was high risk.  We are proceeding  with heart catheterization for further evaluation, possible PCI.  The risks and benefits were discussed at length with the patient and his wife who are agreeable to proceed.  We will attempt a right radial artery approach.  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  Signed: Andrez Grime MD 04/29/2022, 7:48 AM

## 2022-05-01 DIAGNOSIS — Z95820 Peripheral vascular angioplasty status with implants and grafts: Secondary | ICD-10-CM | POA: Insufficient documentation

## 2022-05-27 ENCOUNTER — Encounter: Payer: Medicare Other | Attending: Cardiology | Admitting: *Deleted

## 2022-05-27 ENCOUNTER — Encounter: Payer: Self-pay | Admitting: *Deleted

## 2022-05-27 DIAGNOSIS — Z955 Presence of coronary angioplasty implant and graft: Secondary | ICD-10-CM

## 2022-05-27 NOTE — Progress Notes (Signed)
Virtual orientation call completed today. he has an appointment on Date: 06/04/2022  for EP eval and gym Orientation.  Documentation of diagnosis can be found in [facility] Date: 05/13/2022 .

## 2022-06-04 ENCOUNTER — Encounter: Payer: Medicare Other | Attending: Cardiology

## 2022-06-04 VITALS — Ht 73.75 in | Wt 264.9 lb

## 2022-06-04 DIAGNOSIS — Z955 Presence of coronary angioplasty implant and graft: Secondary | ICD-10-CM | POA: Insufficient documentation

## 2022-06-04 NOTE — Patient Instructions (Signed)
Patient Instructions  Patient Details  Name: Nicholas Abbott MRN: [id number] Date of Birth: 10/19/1951 Referring Provider:  Andrez Grime, MD  Below are your personal goals for exercise, nutrition, and risk factors. Our goal is to help you stay on track towards obtaining and maintaining these goals. We will be discussing your progress on these goals with you throughout the program.  Initial Exercise Prescription:  Initial Exercise Prescription - 06/04/22 1300       Date of Initial Exercise RX and Referring Provider   Date 06/04/22    Referring Provider Donnelly Angelica MD      Oxygen   Maintain Oxygen Saturation 88% or higher      Treadmill   MPH 2.3    Grade 0.5    Minutes 15    METs 2.92      Recumbant Bike   Level 1    RPM 6    Watts 20    Minutes 15    METs 2      NuStep   Level 2    SPM 80    Minutes 15    METs 2      REL-XR   Level 1    Speed 50    Minutes 15    METs 2      T5 Nustep   Level 1    SPM 80    Minutes 15    METs 2      Prescription Details   Frequency (times per week) 3    Duration Progress to 30 minutes of continuous aerobic without signs/symptoms of physical distress      Intensity   THRR 40-80% of Max Heartrate 92 - 129    Ratings of Perceived Exertion 11-13    Perceived Dyspnea 0-4      Progression   Progression Continue to progress workloads to maintain intensity without signs/symptoms of physical distress.      Resistance Training   Training Prescription Yes    Weight 4 lb    Reps 10-15             Exercise Goals: Frequency: Be able to perform aerobic exercise two to three times per week in program working toward 2-5 days per week of home exercise.  Intensity: Work with a perceived exertion of 11 (fairly light) - 15 (hard) while following your exercise prescription.  We will make changes to your prescription with you as you progress through the program.   Duration: Be able to do 30 to 45 minutes of continuous  aerobic exercise in addition to a 5 minute warm-up and a 5 minute cool-down routine.   Nutrition Goals: Your personal nutrition goals will be established when you do your nutrition analysis with the dietician.  The following are general nutrition guidelines to follow: Cholesterol < '200mg'$ /day Sodium < '1500mg'$ /day Fiber: Men over 50 yrs - 30 grams per day  Personal Goals:  Personal Goals and Risk Factors at Admission - 06/04/22 1340       Core Components/Risk Factors/Patient Goals on Admission    Weight Management Yes;Weight Loss;Obesity    Intervention Weight Management: Develop a combined nutrition and exercise program designed to reach desired caloric intake, while maintaining appropriate intake of nutrient and fiber, sodium and fats, and appropriate energy expenditure required for the weight goal.;Weight Management: Provide education and appropriate resources to help participant work on and attain dietary goals.;Weight Management/Obesity: Establish reasonable short term and long term weight goals.    Admit Weight  264 lb (119.7 kg)    Goal Weight: Short Term 259 lb (117.5 kg)    Goal Weight: Long Term 250 lb (113.4 kg)    Expected Outcomes Short Term: Continue to assess and modify interventions until short term weight is achieved;Long Term: Adherence to nutrition and physical activity/exercise program aimed toward attainment of established weight goal;Weight Loss: Understanding of general recommendations for a balanced deficit meal plan, which promotes 1-2 lb weight loss per week and includes a negative energy balance of [phone] kcal/d;Understanding recommendations for meals to include 15-35% energy as protein, 25-35% energy from fat, 35-60% energy from carbohydrates, less than '200mg'$  of dietary cholesterol, 20-35 gm of total fiber daily;Understanding of distribution of calorie intake throughout the day with the consumption of 4-5 meals/snacks    Diabetes Yes    Intervention Provide education  about signs/symptoms and action to take for hypo/hyperglycemia.;Provide education about proper nutrition, including hydration, and aerobic/resistive exercise prescription along with prescribed medications to achieve blood glucose in normal ranges: Fasting glucose 65-99 mg/dL    Expected Outcomes Short Term: Participant verbalizes understanding of the signs/symptoms and immediate care of hyper/hypoglycemia, proper foot care and importance of medication, aerobic/resistive exercise and nutrition plan for blood glucose control.;Long Term: Attainment of HbA1C < 7%.    Hypertension Yes    Intervention Provide education on lifestyle modifcations including regular physical activity/exercise, weight management, moderate sodium restriction and increased consumption of fresh fruit, vegetables, and low fat dairy, alcohol moderation, and smoking cessation.;Monitor prescription use compliance.    Expected Outcomes Short Term: Continued assessment and intervention until BP is < 140/65m HG in hypertensive participants. < 130/898mHG in hypertensive participants with diabetes, heart failure or chronic kidney disease.;Long Term: Maintenance of blood pressure at goal levels.    Lipids Yes    Intervention Provide education and support for participant on nutrition & aerobic/resistive exercise along with prescribed medications to achieve LDL '70mg'$ , HDL >'40mg'$ .    Expected Outcomes Short Term: Participant states understanding of desired cholesterol values and is compliant with medications prescribed. Participant is following exercise prescription and nutrition guidelines.;Long Term: Cholesterol controlled with medications as prescribed, with individualized exercise RX and with personalized nutrition plan. Value goals: LDL < '70mg'$ , HDL > 40 mg.             Tobacco Use Initial Evaluation: Social History   Tobacco Use  Smoking Status Never  Smokeless Tobacco Never    Exercise Goals and Review:  Exercise Goals     Row  Name 06/04/22 1339             Exercise Goals   Increase Physical Activity Yes       Intervention Provide advice, education, support and counseling about physical activity/exercise needs.;Develop an individualized exercise prescription for aerobic and resistive training based on initial evaluation findings, risk stratification, comorbidities and participant's personal goals.       Expected Outcomes Short Term: Attend rehab on a regular basis to increase amount of physical activity.;Long Term: Add in home exercise to make exercise part of routine and to increase amount of physical activity.;Long Term: Exercising regularly at least 3-5 days a week.       Increase Strength and Stamina Yes       Intervention Provide advice, education, support and counseling about physical activity/exercise needs.;Develop an individualized exercise prescription for aerobic and resistive training based on initial evaluation findings, risk stratification, comorbidities and participant's personal goals.       Expected Outcomes Short Term: Increase workloads  from initial exercise prescription for resistance, speed, and METs.;Short Term: Perform resistance training exercises routinely during rehab and add in resistance training at home;Long Term: Improve cardiorespiratory fitness, muscular endurance and strength as measured by increased METs and functional capacity (6MWT)       Able to understand and use rate of perceived exertion (RPE) scale Yes       Intervention Provide education and explanation on how to use RPE scale       Expected Outcomes Short Term: Able to use RPE daily in rehab to express subjective intensity level;Long Term:  Able to use RPE to guide intensity level when exercising independently       Able to understand and use Dyspnea scale Yes       Intervention Provide education and explanation on how to use Dyspnea scale       Expected Outcomes Short Term: Able to use Dyspnea scale daily in rehab to express  subjective sense of shortness of breath during exertion;Long Term: Able to use Dyspnea scale to guide intensity level when exercising independently       Knowledge and understanding of Target Heart Rate Range (THRR) Yes       Intervention Provide education and explanation of THRR including how the numbers were predicted and where they are located for reference       Expected Outcomes Short Term: Able to state/look up THRR;Long Term: Able to use THRR to govern intensity when exercising independently;Short Term: Able to use daily as guideline for intensity in rehab       Able to check pulse independently Yes       Intervention Review the importance of being able to check your own pulse for safety during independent exercise;Provide education and demonstration on how to check pulse in carotid and radial arteries.       Expected Outcomes Short Term: Able to explain why pulse checking is important during independent exercise;Long Term: Able to check pulse independently and accurately       Understanding of Exercise Prescription Yes       Intervention Provide education, explanation, and written materials on patient's individual exercise prescription       Expected Outcomes Short Term: Able to explain program exercise prescription;Long Term: Able to explain home exercise prescription to exercise independently                Copy of goals given to participant.

## 2022-06-04 NOTE — Progress Notes (Signed)
Cardiac Individual Treatment Plan  Patient Details  Name: Nicholas Abbott MRN: [id number] Date of Birth: 1951-01-23 Referring Provider:   Flowsheet Row Cardiac Rehab from 06/04/2022 in [facility] Cardiac and Pulmonary Rehab  Referring Provider Donnelly Angelica MD       Initial Encounter Date:  Flowsheet Row Cardiac Rehab from 06/04/2022 in [facility] Cardiac and Pulmonary Rehab  Date 06/04/22       Visit Diagnosis: Status post coronary artery stent placement  Patient's Home Medications on Admission:  Current Outpatient Medications:    aspirin EC 81 MG tablet, Take 81 mg by mouth daily., Disp: , Rfl:    azithromycin (ZITHROMAX) 250 MG tablet, Take by mouth., Disp: , Rfl:    Cinnamon 500 MG capsule, Take 1,000 mg by mouth 2 (two) times daily as needed (blood sugar control)., Disp: , Rfl:    clopidogrel (PLAVIX) 75 MG tablet, Take 1 tablet (75 mg total) by mouth daily., Disp: 30 tablet, Rfl: 11   Cyanocobalamin (VITAMIN B-12) 5000 MCG TBDP, Take 5,000 mcg by mouth daily. , Disp: , Rfl:    empagliflozin (JARDIANCE) 10 MG TABS tablet, Take 1 tablet by mouth daily with breakfast., Disp: , Rfl:    folic acid-pyridoxine-cyancobalamin (FOLTX) 2.5-25-2 MG TABS tablet, Take 1 tablet by mouth daily., Disp: , Rfl:    ketoconazole (NIZORAL) 2 % cream, APPLY TO THE GROIN AT BEDTIME, Disp: 60 g, Rfl: 1   levothyroxine (SYNTHROID) 112 MCG tablet, Take 112 mcg by mouth daily., Disp: , Rfl:    metoprolol succinate (TOPROL-XL) 25 MG 24 hr tablet, Take 0.5 tablets (12.5 mg total) by mouth daily., Disp: 15 tablet, Rfl: 11   nitroGLYCERIN (NITROSTAT) 0.4 MG SL tablet, Place under the tongue., Disp: , Rfl:    ramipril (ALTACE) 2.5 MG capsule, Take 2.5 mg by mouth daily., Disp: , Rfl:    rosuvastatin (CRESTOR) 20 MG tablet, Take by mouth., Disp: , Rfl:    rosuvastatin (CRESTOR) 40 MG tablet, Take by mouth., Disp: , Rfl:    terbinafine (LAMISIL) 250 MG tablet, Take 1 tablet (250 mg total) by mouth daily. (Patient not taking:  Reported on 05/27/2022), Disp: 30 tablet, Rfl: 0   traMADol (ULTRAM) 50 MG tablet, , Disp: , Rfl:   Past Medical History: Past Medical History:  Diagnosis Date   Diabetes mellitus without complication (HCC)    GERD (gastroesophageal reflux disease)    Hyperlipidemia    Hypertension    Left shoulder pain    Myocardial infarction ([place])     Tobacco Use: Social History   Tobacco Use  Smoking Status Never  Smokeless Tobacco Never    Labs: Review Flowsheet        No data to display           Exercise Target Goals: Exercise Program Goal: Individual exercise prescription set using results from initial 6 min walk test and THRR while considering  patient's activity barriers and safety.   Exercise Prescription Goal: Initial exercise prescription builds to 30-45 minutes a day of aerobic activity, 2-3 days per week.  Home exercise guidelines will be given to patient during program as part of exercise prescription that the participant will acknowledge.   Education: Aerobic Exercise: - Group verbal and visual presentation on the components of exercise prescription. Introduces F.I.T.T principle from ACSM for exercise prescriptions.  Reviews F.I.T.T. principles of aerobic exercise including progression. Written material given at graduation. Flowsheet Row Cardiac Rehab from 06/04/2022 in [facility] Cardiac and Pulmonary Rehab  Education need identified  06/04/22       Education: Resistance Exercise: - Group verbal and visual presentation on the components of exercise prescription. Introduces F.I.T.T principle from ACSM for exercise prescriptions  Reviews F.I.T.T. principles of resistance exercise including progression. Written material given at graduation.    Education: Exercise & Equipment Safety: - Individual verbal instruction and demonstration of equipment use and safety with use of the equipment. Flowsheet Row Cardiac Rehab from 06/04/2022 in [facility] Cardiac and Pulmonary Rehab  Education  need identified 06/04/22  Date 06/04/22  Educator [place]  Instruction Review Code 1- Verbalizes Understanding       Education: Exercise Physiology & General Exercise Guidelines: - Group verbal and written instruction with models to review the exercise physiology of the cardiovascular system and associated critical values. Provides general exercise guidelines with specific guidelines to those with heart or lung disease.  Flowsheet Row Cardiac Rehab from 06/04/2022 in [facility] Cardiac and Pulmonary Rehab  Education need identified 06/04/22       Education: Flexibility, Balance, Mind/Body Relaxation: - Group verbal and visual presentation with interactive activity on the components of exercise prescription. Introduces F.I.T.T principle from ACSM for exercise prescriptions. Reviews F.I.T.T. principles of flexibility and balance exercise training including progression. Also discusses the mind body connection.  Reviews various relaxation techniques to help reduce and manage stress (i.e. Deep breathing, progressive muscle relaxation, and visualization). Balance handout provided to take home. Written material given at graduation.   Activity Barriers & Risk Stratification:  Activity Barriers & Cardiac Risk Stratification - 06/04/22 1206       Activity Barriers & Cardiac Risk Stratification   Activity Barriers Deconditioning;Muscular Weakness    Cardiac Risk Stratification Moderate             6 Minute Walk:  6 Minute Walk     Row Name 06/04/22 1206         6 Minute Walk   Phase Initial     Distance 1210 feet     Walk Time 6 minutes     # of Rest Breaks 0     MPH 2.29     METS 2.05     RPE 8     Perceived Dyspnea  1     VO2 Peak 7.19     Symptoms No     Resting HR 55 bpm     Resting BP 118/72     Resting Oxygen Saturation  94 %     Exercise Oxygen Saturation  during 6 min walk 98 %     Max Ex. HR 76 bpm     Max Ex. BP 136/64     2 Minute Post BP 126/66               Oxygen Initial Assessment:   Oxygen Re-Evaluation:   Oxygen Discharge (Final Oxygen Re-Evaluation):   Initial Exercise Prescription:  Initial Exercise Prescription - 06/04/22 1300       Date of Initial Exercise RX and Referring Provider   Date 06/04/22    Referring Provider Donnelly Angelica MD      Oxygen   Maintain Oxygen Saturation 88% or higher      Treadmill   MPH 2.3    Grade 0.5    Minutes 15    METs 2.92      Recumbant Bike   Level 1    RPM 60    Watts 20    Minutes 15    METs 2      NuStep  Level 2    SPM 80    Minutes 15    METs 2      REL-XR   Level 1    Speed 50    Minutes 15    METs 2      T5 Nustep   Level 1    SPM 80    Minutes 15    METs 2      Prescription Details   Frequency (times per week) 3    Duration Progress to 30 minutes of continuous aerobic without signs/symptoms of physical distress      Intensity   THRR 40-80% of Max Heartrate 92 - 129    Ratings of Perceived Exertion 11-13    Perceived Dyspnea 0-4      Progression   Progression Continue to progress workloads to maintain intensity without signs/symptoms of physical distress.      Resistance Training   Training Prescription Yes    Weight 4 lb    Reps 10-15             Perform Capillary Blood Glucose checks as needed.  Exercise Prescription Changes:   Exercise Prescription Changes     Row Name 06/04/22 1300             Response to Exercise   Blood Pressure (Admit) 118/72       Blood Pressure (Exercise) 136/64       Blood Pressure (Exit) 126/66       Heart Rate (Admit) 55 bpm       Heart Rate (Exercise) 76 bpm       Heart Rate (Exit) 56 bpm       Oxygen Saturation (Admit) 94 %       Oxygen Saturation (Exercise) 98 %       Oxygen Saturation (Exit) 98 %       Rating of Perceived Exertion (Exercise) 8       Perceived Dyspnea (Exercise) 1       Symptoms none       Comments walk test results                Exercise Comments:   Exercise  Goals and Review:   Exercise Goals     Row Name 06/04/22 1339             Exercise Goals   Increase Physical Activity Yes       Intervention Provide advice, education, support and counseling about physical activity/exercise needs.;Develop an individualized exercise prescription for aerobic and resistive training based on initial evaluation findings, risk stratification, comorbidities and participant's personal goals.       Expected Outcomes Short Term: Attend rehab on a regular basis to increase amount of physical activity.;Long Term: Add in home exercise to make exercise part of routine and to increase amount of physical activity.;Long Term: Exercising regularly at least 3-5 days a week.       Increase Strength and Stamina Yes       Intervention Provide advice, education, support and counseling about physical activity/exercise needs.;Develop an individualized exercise prescription for aerobic and resistive training based on initial evaluation findings, risk stratification, comorbidities and participant's personal goals.       Expected Outcomes Short Term: Increase workloads from initial exercise prescription for resistance, speed, and METs.;Short Term: Perform resistance training exercises routinely during rehab and add in resistance training at home;Long Term: Improve cardiorespiratory fitness, muscular endurance and strength as measured by increased METs and functional capacity (6MWT)  Able to understand and use rate of perceived exertion (RPE) scale Yes       Intervention Provide education and explanation on how to use RPE scale       Expected Outcomes Short Term: Able to use RPE daily in rehab to express subjective intensity level;Long Term:  Able to use RPE to guide intensity level when exercising independently       Able to understand and use Dyspnea scale Yes       Intervention Provide education and explanation on how to use Dyspnea scale       Expected Outcomes Short Term: Able to  use Dyspnea scale daily in rehab to express subjective sense of shortness of breath during exertion;Long Term: Able to use Dyspnea scale to guide intensity level when exercising independently       Knowledge and understanding of Target Heart Rate Range (THRR) Yes       Intervention Provide education and explanation of THRR including how the numbers were predicted and where they are located for reference       Expected Outcomes Short Term: Able to state/look up THRR;Long Term: Able to use THRR to govern intensity when exercising independently;Short Term: Able to use daily as guideline for intensity in rehab       Able to check pulse independently Yes       Intervention Review the importance of being able to check your own pulse for safety during independent exercise;Provide education and demonstration on how to check pulse in carotid and radial arteries.       Expected Outcomes Short Term: Able to explain why pulse checking is important during independent exercise;Long Term: Able to check pulse independently and accurately       Understanding of Exercise Prescription Yes       Intervention Provide education, explanation, and written materials on patient's individual exercise prescription       Expected Outcomes Short Term: Able to explain program exercise prescription;Long Term: Able to explain home exercise prescription to exercise independently                Exercise Goals Re-Evaluation :   Discharge Exercise Prescription (Final Exercise Prescription Changes):  Exercise Prescription Changes - 06/04/22 1300       Response to Exercise   Blood Pressure (Admit) 118/72    Blood Pressure (Exercise) 136/64    Blood Pressure (Exit) 126/66    Heart Rate (Admit) 55 bpm    Heart Rate (Exercise) 76 bpm    Heart Rate (Exit) 56 bpm    Oxygen Saturation (Admit) 94 %    Oxygen Saturation (Exercise) 98 %    Oxygen Saturation (Exit) 98 %    Rating of Perceived Exertion (Exercise) 8    Perceived  Dyspnea (Exercise) 1    Symptoms none    Comments walk test results             Nutrition:  Target Goals: Understanding of nutrition guidelines, daily intake of sodium '1500mg'$ , cholesterol '200mg'$ , calories 30% from fat and 7% or less from saturated fats, daily to have 5 or more servings of fruits and vegetables.  Education: All About Nutrition: -Group instruction provided by verbal, written material, interactive activities, discussions, models, and posters to present general guidelines for heart healthy nutrition including fat, fiber, MyPlate, the role of sodium in heart healthy nutrition, utilization of the nutrition label, and utilization of this knowledge for meal planning. Follow up email sent as well. Written material given at graduation.  Biometrics:  Pre Biometrics - 06/04/22 1206       Pre Biometrics   Height 6' 1.75" (1.873 m)    Weight 264 lb 14.4 oz (120.2 kg)    BMI (Calculated) 34.25    Single Leg Stand 3.5 seconds              Nutrition Therapy Plan and Nutrition Goals:  Nutrition Therapy & Goals - 06/04/22 1136       Intervention Plan   Intervention Prescribe, educate and counsel regarding individualized specific dietary modifications aiming towards targeted core components such as weight, hypertension, lipid management, diabetes, heart failure and other comorbidities.    Expected Outcomes Short Term Goal: Understand basic principles of dietary content, such as calories, fat, sodium, cholesterol and nutrients.;Short Term Goal: A plan has been developed with personal nutrition goals set during dietitian appointment.;Long Term Goal: Adherence to prescribed nutrition plan.             Nutrition Assessments:  MEDIFICTS Score Key: ?70 Need to make dietary changes  40-70 Heart Healthy Diet ? 40 Therapeutic Level Cholesterol Diet  Flowsheet Row Cardiac Rehab from 06/04/2022 in [facility] Cardiac and Pulmonary Rehab  Picture Your Plate Total Score on Admission  65      Picture Your Plate Scores: <17 Unhealthy dietary pattern with much room for improvement. 41-50 Dietary pattern unlikely to meet recommendations for good health and room for improvement. 51-60 More healthful dietary pattern, with some room for improvement.  >60 Healthy dietary pattern, although there may be some specific behaviors that could be improved.    Nutrition Goals Re-Evaluation:   Nutrition Goals Discharge (Final Nutrition Goals Re-Evaluation):   Psychosocial: Target Goals: Acknowledge presence or absence of significant depression and/or stress, maximize coping skills, provide positive support system. Participant is able to verbalize types and ability to use techniques and skills needed for reducing stress and depression.   Education: Stress, Anxiety, and Depression - Group verbal and visual presentation to define topics covered.  Reviews how body is impacted by stress, anxiety, and depression.  Also discusses healthy ways to reduce stress and to treat/manage anxiety and depression.  Written material given at graduation.   Education: Sleep Hygiene -Provides group verbal and written instruction about how sleep can affect your health.  Define sleep hygiene, discuss sleep cycles and impact of sleep habits. Review good sleep hygiene tips.    Initial Review & Psychosocial Screening:  Initial Psych Review & Screening - 05/27/22 1312       Initial Review   Current issues with None Identified      Family Dynamics   Good Support System? Yes   wife     Barriers   Psychosocial barriers to participate in program There are no identifiable barriers or psychosocial needs.      Screening Interventions   Interventions Encouraged to exercise;To provide support and resources with identified psychosocial needs;Provide feedback about the scores to participant    Expected Outcomes Short Term goal: Utilizing psychosocial counselor, staff and physician to assist with identification  of specific Stressors or current issues interfering with healing process. Setting desired goal for each stressor or current issue identified.;Long Term Goal: Stressors or current issues are controlled or eliminated.;Short Term goal: Identification and review with participant of any Quality of Life or Depression concerns found by scoring the questionnaire.;Long Term goal: The participant improves quality of Life and PHQ9 Scores as seen by post scores and/or verbalization of changes  Quality of Life Scores:   Quality of Life - 06/04/22 1136       Quality of Life   Select Quality of Life      Quality of Life Scores   Health/Function Pre 16.13 %    Socioeconomic Pre 21.26 %    Psych/Spiritual Pre 21.71 %    Family Pre 25.4 %    GLOBAL Pre 19.83 %            Scores of 19 and below usually indicate a poorer quality of life in these areas.  A difference of  2-3 points is a clinically meaningful difference.  A difference of 2-3 points in the total score of the Quality of Life Index has been associated with significant improvement in overall quality of life, self-image, physical symptoms, and general health in studies assessing change in quality of life.  PHQ-9: Review Flowsheet       06/04/2022 05/04/2017  Depression screen PHQ 2/9  Decreased Interest 0 0  Down, Depressed, Hopeless 0 0  PHQ - 2 Score 0 0  Altered sleeping 1 -  Tired, decreased energy 1 -  Change in appetite 0 -  Feeling bad or failure about yourself  1 -  Trouble concentrating 0 -  Moving slowly or fidgety/restless 0 -  Suicidal thoughts 0 -  PHQ-9 Score 3 -  Difficult doing work/chores Somewhat difficult -   Interpretation of Total Score  Total Score Depression Severity:  1-4 = Minimal depression, 5-9 = Mild depression, 10-14 = Moderate depression, 15-19 = Moderately severe depression, 20-27 = Severe depression   Psychosocial Evaluation and Intervention:  Psychosocial Evaluation - 05/27/22 1324        Psychosocial Evaluation & Interventions   Interventions Encouraged to exercise with the program and follow exercise prescription    Comments Ronalee Belts ahs no barriers to attending the program. He lives with his wife and she is his support. They have been married over 70 years. He wants to learn about taking care of himself with heart disease. He does have diabetes and has attended the Diabetes Education in the past.  He is consciuosly working on weight loss. He has about 15 lbs more he would like to lose. He is ready to get started    Expected Outcomes STG: mike attends all scheduled sessions. He gains knowledge to work on caring for himself as he lives with heart disease. LTG Ronalee Belts continues with exercsie progression and caring for himself in a heart healthy manor.    Continue Psychosocial Services  Follow up required by staff             Psychosocial Re-Evaluation:   Psychosocial Discharge (Final Psychosocial Re-Evaluation):   Vocational Rehabilitation: Provide vocational rehab assistance to qualifying candidates.   Vocational Rehab Evaluation & Intervention:  Vocational Rehab - 05/27/22 1328       Initial Vocational Rehab Evaluation & Intervention   Assessment shows need for Vocational Rehabilitation No      Vocational Rehab Re-Evaulation   Comments Retierd      Discharge Vocational Rehab   Discharge Vocational Rehabilitation RETIRED             Education: Education Goals: Education classes will be provided on a variety of topics geared toward better understanding of heart health and risk factor modification. Participant will state understanding/return demonstration of topics presented as noted by education test scores.  Learning Barriers/Preferences:   General Cardiac Education Topics:  AED/CPR: - Group verbal and written instruction  with the use of models to demonstrate the basic use of the AED with the basic ABC's of resuscitation.   Anatomy and Cardiac  Procedures: - Group verbal and visual presentation and models provide information about basic cardiac anatomy and function. Reviews the testing methods done to diagnose heart disease and the outcomes of the test results. Describes the treatment choices: Medical Management, Angioplasty, or Coronary Bypass Surgery for treating various heart conditions including Myocardial Infarction, Angina, Valve Disease, and Cardiac Arrhythmias.  Written material given at graduation. Flowsheet Row Cardiac Rehab from 06/04/2022 in [facility] Cardiac and Pulmonary Rehab  Education need identified 06/04/22       Medication Safety: - Group verbal and visual instruction to review commonly prescribed medications for heart and lung disease. Reviews the medication, class of the drug, and side effects. Includes the steps to properly store meds and maintain the prescription regimen.  Written material given at graduation.   Intimacy: - Group verbal instruction through game format to discuss how heart and lung disease can affect sexual intimacy. Written material given at graduation..   Know Your Numbers and Heart Failure: - Group verbal and visual instruction to discuss disease risk factors for cardiac and pulmonary disease and treatment options.  Reviews associated critical values for Overweight/Obesity, Hypertension, Cholesterol, and Diabetes.  Discusses basics of heart failure: signs/symptoms and treatments.  Introduces Heart Failure Zone chart for action plan for heart failure.  Written material given at graduation.   Infection Prevention: - Provides verbal and written material to individual with discussion of infection control including proper hand washing and proper equipment cleaning during exercise session. Flowsheet Row Cardiac Rehab from 06/04/2022 in [facility] Cardiac and Pulmonary Rehab  Education need identified 06/04/22  Date 06/04/22  Educator [place]  Instruction Review Code 1- Verbalizes Understanding       Falls  Prevention: - Provides verbal and written material to individual with discussion of falls prevention and safety. Flowsheet Row Cardiac Rehab from 05/27/2022 in [facility] Cardiac and Pulmonary Rehab  Date 05/27/22  Educator [place]  Instruction Review Code 1- Verbalizes Understanding       Other: -Provides group and verbal instruction on various topics (see comments)   Knowledge Questionnaire Score:  Knowledge Questionnaire Score - 06/04/22 1136       Knowledge Questionnaire Score   Pre Score 24/26             Core Components/Risk Factors/Patient Goals at Admission:  Personal Goals and Risk Factors at Admission - 06/04/22 1340       Core Components/Risk Factors/Patient Goals on Admission    Weight Management Yes;Weight Loss;Obesity    Intervention Weight Management: Develop a combined nutrition and exercise program designed to reach desired caloric intake, while maintaining appropriate intake of nutrient and fiber, sodium and fats, and appropriate energy expenditure required for the weight goal.;Weight Management: Provide education and appropriate resources to help participant work on and attain dietary goals.;Weight Management/Obesity: Establish reasonable short term and long term weight goals.    Admit Weight 264 lb (119.7 kg)    Goal Weight: Short Term 259 lb (117.5 kg)    Goal Weight: Long Term 250 lb (113.4 kg)    Expected Outcomes Short Term: Continue to assess and modify interventions until short term weight is achieved;Long Term: Adherence to nutrition and physical activity/exercise program aimed toward attainment of established weight goal;Weight Loss: Understanding of general recommendations for a balanced deficit meal plan, which promotes 1-2 lb weight loss per week and includes a negative energy  balance of [id number] kcal/d;Understanding recommendations for meals to include 15-35% energy as protein, 25-35% energy from fat, 35-60% energy from carbohydrates, less than '200mg'$  of  dietary cholesterol, 20-35 gm of total fiber daily;Understanding of distribution of calorie intake throughout the day with the consumption of 4-5 meals/snacks    Diabetes Yes    Intervention Provide education about signs/symptoms and action to take for hypo/hyperglycemia.;Provide education about proper nutrition, including hydration, and aerobic/resistive exercise prescription along with prescribed medications to achieve blood glucose in normal ranges: Fasting glucose 65-99 mg/dL    Expected Outcomes Short Term: Participant verbalizes understanding of the signs/symptoms and immediate care of hyper/hypoglycemia, proper foot care and importance of medication, aerobic/resistive exercise and nutrition plan for blood glucose control.;Long Term: Attainment of HbA1C < 7%.    Hypertension Yes    Intervention Provide education on lifestyle modifcations including regular physical activity/exercise, weight management, moderate sodium restriction and increased consumption of fresh fruit, vegetables, and low fat dairy, alcohol moderation, and smoking cessation.;Monitor prescription use compliance.    Expected Outcomes Short Term: Continued assessment and intervention until BP is < 140/63m HG in hypertensive participants. < 130/85mHG in hypertensive participants with diabetes, heart failure or chronic kidney disease.;Long Term: Maintenance of blood pressure at goal levels.    Lipids Yes    Intervention Provide education and support for participant on nutrition & aerobic/resistive exercise along with prescribed medications to achieve LDL '70mg'$ , HDL >'40mg'$ .    Expected Outcomes Short Term: Participant states understanding of desired cholesterol values and is compliant with medications prescribed. Participant is following exercise prescription and nutrition guidelines.;Long Term: Cholesterol controlled with medications as prescribed, with individualized exercise RX and with personalized nutrition plan. Value goals: LDL <  '70mg'$ , HDL > 40 mg.             Education:Diabetes - Individual verbal and written instruction to review signs/symptoms of diabetes, desired ranges of glucose level fasting, after meals and with exercise. Acknowledge that pre and post exercise glucose checks will be done for 3 sessions at entry of program. [facility]  Education need identified 06/04/22  Date 06/04/22  Educator KLKaaawaInstruction Review Code 1- Verbalizes Understanding       Core Components/Risk Factors/Patient Goals Review:    Core Components/Risk Factors/Patient Goals at Discharge (Final Review):    ITP Comments:  ITP Comments     Row Name 05/27/22 1329 06/04/22 1134 06/04/22 1341       ITP Comments Virtual orientation call completed today. he has an appointment on Date: 06/04/2022  for EP eval and gym Orientation.  [facility] . Completed 6MWT and gym orientation. Initial ITP created and sent for review to Dr. BeRamonita LabEKG Strips sent for review.              Comments: Initial ITP

## 2022-06-10 ENCOUNTER — Encounter: Payer: Medicare Other | Admitting: *Deleted

## 2022-06-10 ENCOUNTER — Other Ambulatory Visit: Payer: Self-pay | Admitting: Dermatology

## 2022-06-10 DIAGNOSIS — Z955 Presence of coronary angioplasty implant and graft: Secondary | ICD-10-CM

## 2022-06-10 DIAGNOSIS — B356 Tinea cruris: Secondary | ICD-10-CM

## 2022-06-10 LAB — GLUCOSE, CAPILLARY
Glucose-Capillary: 137 mg/dL — ABNORMAL HIGH (ref 70–99)
Glucose-Capillary: 91 mg/dL (ref 70–99)

## 2022-06-10 NOTE — Progress Notes (Signed)
Daily Session Note  Patient Details  Name: Nicholas Abbott MRN: [id number] Date of Birth: Sep 19, 1951 Referring Provider:   Flowsheet Row [facility]  Referring Provider Donnelly Angelica MD       Encounter Date: 06/10/2022  Check In:  Session Check In - 06/10/22 1606       Check-In   Supervising physician immediately available to respond to emergencies See telemetry face sheet for immediately available ER MD    Location [facility]-Cardiac & Pulmonary Rehab    Staff Present Nyoka Cowden, RN, BSN, MA;Susanne Bice, RN, BSN, CCRP;Melissa Livingston, RDN, LDN;Jessica Nokomis, MA, RCEP, CCRP, [place], MS, ASCM CEP, Exercise Physiologist    Virtual Visit No    Medication changes reported     No    Fall or balance concerns reported    No    Tobacco Cessation No Change    Warm-up and Cool-down Performed on first and last piece of equipment    Resistance Training Performed Yes    VAD Patient? No    PAD/SET Patient? No      Pain Assessment   Currently in Pain? No/denies                Social History   Tobacco Use  Smoking Status Never  Smokeless Tobacco Never    Goals Met:  Independence with exercise equipment Exercise tolerated well No report of concerns or symptoms today  Goals Unmet:  Not Applicable  Comments: Pt able to follow exercise prescription today without complaint.  Will continue to monitor for progression.    Dr. Emily Filbert is Medical Director for [place].  Dr. Ottie Glazier is Medical Director for [facility] Pulmonary Rehabilitation.

## 2022-06-12 ENCOUNTER — Encounter: Payer: Medicare Other | Admitting: *Deleted

## 2022-06-12 DIAGNOSIS — Z955 Presence of coronary angioplasty implant and graft: Secondary | ICD-10-CM

## 2022-06-12 LAB — GLUCOSE, CAPILLARY
Glucose-Capillary: 132 mg/dL — ABNORMAL HIGH (ref 70–99)
Glucose-Capillary: 85 mg/dL (ref 70–99)

## 2022-06-12 NOTE — Progress Notes (Signed)
Daily Session Note  Patient Details  Name: Nicholas Abbott MRN: [id number] Date of Birth: 1951-04-02 Referring Provider:   Flowsheet Row Cardiac Rehab from 06/04/2022 in [facility] Cardiac and Pulmonary Rehab  Referring Provider Donnelly Angelica MD       Encounter Date: 06/12/2022  Check In:  Session Check In - 06/12/22 1207       Check-In   Supervising physician immediately available to respond to emergencies See telemetry face sheet for immediately available ER MD    Location [facility]Card[facility] & Pulmonary Rehab    Staff Present Nyoka Cowden, RN, BSN, [place], BS, RRT, CPFT;Jessica Luan Pulling, MA, RCEP, CCRP, CCET    Virtual Visit No    Medication changes reported     No    Tobacco Cessation No Change    Warm-up and Cool-down Performed on first and last piece of equipment    Resistance Training Performed Yes    VAD Patient? No    PAD/SET Patient? No      Pain Assessment   Currently in Pain? No/denies            pre BS 132 Post BS 85 pt going home now to eat lunch    Social History   Tobacco Use  Smoking Status Never  Smokeless Tobacco Never    Goals Met:  Improved SOB with ADL's Exercise tolerated well No report of concerns or symptoms today  Goals Unmet:  Not Applicable  Comments: Pt able to follow exercise prescription today without complaint.  Will continue to monitor for progression.    Dr. Emily Filbert is Medical Director for [place].  Dr. Ottie Glazier is Medical Director for [facility] Pulmonary Rehabilitation.

## 2022-06-15 ENCOUNTER — Encounter: Payer: Medicare Other | Admitting: *Deleted

## 2022-06-15 DIAGNOSIS — Z955 Presence of coronary angioplasty implant and graft: Secondary | ICD-10-CM

## 2022-06-15 LAB — GLUCOSE, CAPILLARY
Glucose-Capillary: 142 mg/dL — ABNORMAL HIGH (ref 70–99)
Glucose-Capillary: 96 mg/dL (ref 70–99)

## 2022-06-15 NOTE — Progress Notes (Signed)
Daily Session Note  Patient Details  Name: Nicholas Abbott MRN: [id number] Date of Birth: 03/09/1951 Referring Provider:   Flowsheet Row Cardiac Rehab from 06/04/2022 in [facility] Cardiac and Pulmonary Rehab  Referring Provider Orgel, Ryan MD       Encounter Date: 06/15/2022  Check In:  Session Check In - 06/15/22 1117       Check-In   Supervising physician immediately available to respond to emergencies See telemetry face sheet for immediately available ER MD    Location [facility]-Cardiac & Pulmonary Rehab    Staff Present Laureen Brown, BS, RRT, CPFT;Meredith Craven, RN BSN;Kelly Hayes, BS, ACSM CEP, Exercise Physiologist    Virtual Visit No    Medication changes reported     No    Fall or balance concerns reported    No    Warm-up and Cool-down Performed on first and last piece of equipment    Resistance Training Performed Yes    VAD Patient? No    PAD/SET Patient? No      Pain Assessment   Currently in Pain? No/denies                Social History   Tobacco Use  Smoking Status Never  Smokeless Tobacco Never    Goals Met:  Independence with exercise equipment Exercise tolerated well No report of concerns or symptoms today Strength training completed today  Goals Unmet:  Not Applicable  Comments: Pt able to follow exercise prescription today without complaint.  Will continue to monitor for progression.    Dr. Mark Miller is Medical Director for [facility]iac [facility].  Dr. Fuad Aleskerov is Medical Director for [facility] Pulmonary Rehabilitation. 

## 2022-06-17 ENCOUNTER — Encounter: Payer: Medicare Other | Admitting: *Deleted

## 2022-06-17 DIAGNOSIS — Z955 Presence of coronary angioplasty implant and graft: Secondary | ICD-10-CM | POA: Diagnosis not present

## 2022-06-17 NOTE — Progress Notes (Signed)
Daily Session Note  Patient Details  Name: Nicholas Abbott MRN: [id number] Date of Birth: 08-Sep-1951 Referring Provider:   Flowsheet Row Cardiac Rehab from 06/04/2022 in [facility] Cardiac and Pulmonary Rehab  Referring Provider Donnelly Angelica MD       Encounter Date: 06/17/2022  Check In:  Session Check In - 06/17/22 1103       Check-In   Supervising physician immediately available to respond to emergencies See telemetry face sheet for immediately available ER MD    Location [facility]-Cardiac & Pulmonary Rehab    Staff Present Hope Budds, RDN, LDN;Jessica Luan Pulling, MA, RCEP, CCRP, CCET;Ezriel Boffa Sherryll Burger, RN BSN    Virtual Visit No    Medication changes reported     No    Fall or balance concerns reported    No    Warm-up and Cool-down Performed on first and last piece of equipment    Resistance Training Performed Yes    VAD Patient? No    PAD/SET Patient? No      Pain Assessment   Currently in Pain? No/denies                Social History   Tobacco Use  Smoking Status Never  Smokeless Tobacco Never    Goals Met:  Independence with exercise equipment Exercise tolerated well No report of concerns or symptoms today Strength training completed today  Goals Unmet:  Not Applicable  Comments: Pt able to follow exercise prescription today without complaint.  Will continue to monitor for progression.    Dr. Emily Filbert is Medical Director for [place].  Dr. Ottie Glazier is Medical Director for [facility] Pulmonary Rehabilitation.

## 2022-06-19 ENCOUNTER — Encounter: Payer: Medicare Other | Admitting: *Deleted

## 2022-06-19 DIAGNOSIS — Z955 Presence of coronary angioplasty implant and graft: Secondary | ICD-10-CM

## 2022-06-19 NOTE — Progress Notes (Signed)
Daily Session Note  Patient Details  Name: Nicholas Abbott MRN: [id number] Date of Birth: 10-30-1951 Referring Provider:   Flowsheet Row Cardiac Rehab from 06/04/2022 in [facility] Cardiac and Pulmonary Rehab  Referring Provider Donnelly Angelica MD       Encounter Date: 06/19/2022  Check In:  Session Check In - 06/19/22 1108       Check-In   Supervising physician immediately available to respond to emergencies See telemetry face sheet for immediately available ER MD    Location [facility]-Cardiac & Pulmonary Rehab    Staff Present Alberteen Sam, MA, RCEP, CCRP, CCET;Joseph [place], RCP,RRT,BSRT;Zissel Biederman Sherryll Burger, RN BSN    Virtual Visit No    Medication changes reported     No    Fall or balance concerns reported    No    Warm-up and Cool-down Performed on first and last piece of equipment    Resistance Training Performed Yes    VAD Patient? No    PAD/SET Patient? No      Pain Assessment   Currently in Pain? No/denies                Social History   Tobacco Use  Smoking Status Never  Smokeless Tobacco Never    Goals Met:  Independence with exercise equipment Exercise tolerated well No report of concerns or symptoms today Strength training completed today  Goals Unmet:  Not Applicable  Comments: Pt able to follow exercise prescription today without complaint.  Will continue to monitor for progression.  Reviewed home exercise with pt today.  Pt plans to walk and use elliptical at home for exercise.  Reviewed THR, pulse, RPE, sign and symptoms, pulse oximetery and when to call 911 or MD.  Also discussed weather considerations and indoor options.  Pt voiced understanding.   Dr. Emily Filbert is Medical Director for [place].  Dr. Ottie Glazier is Medical Director for [facility] Pulmonary Rehabilitation.

## 2022-06-22 ENCOUNTER — Encounter: Payer: Medicare Other | Admitting: *Deleted

## 2022-06-22 DIAGNOSIS — Z955 Presence of coronary angioplasty implant and graft: Secondary | ICD-10-CM

## 2022-06-22 NOTE — Progress Notes (Signed)
Daily Session Note  Patient Details  Name: Nicholas Abbott MRN: [id number] Date of Birth: 01-Sep-1951 Referring Provider:   Flowsheet Row Cardiac Rehab from 06/04/2022 in [facility] Cardiac and Pulmonary Rehab  Referring Provider Donnelly Angelica MD       Encounter Date: 06/22/2022  Check In:  Session Check In - 06/22/22 1125       Check-In   Supervising physician immediately available to respond to emergencies See telemetry face sheet for immediately available ER MD    Location [facility]-Cardiac & Pulmonary Rehab    Staff Present Justin Mend, RCP,RRT,BSRT;Addison Freimuth Sherryll Burger, RN Moises Blood, BS, ACSM CEP, Exercise Physiologist    Virtual Visit No    Medication changes reported     No    Fall or balance concerns reported    No    Warm-up and Cool-down Performed on first and last piece of equipment    Resistance Training Performed Yes    VAD Patient? No    PAD/SET Patient? No      Pain Assessment   Currently in Pain? No/denies                Social History   Tobacco Use  Smoking Status Never  Smokeless Tobacco Never    Goals Met:  Independence with exercise equipment Exercise tolerated well No report of concerns or symptoms today Strength training completed today  Goals Unmet:  Not Applicable  Comments: Pt able to follow exercise prescription today without complaint.  Will continue to monitor for progression.    Dr. Emily Filbert is Medical Director for [place].  Dr. Ottie Glazier is Medical Director for [facility] Pulmonary Rehabilitation.

## 2022-06-24 ENCOUNTER — Encounter: Payer: Medicare Other | Admitting: *Deleted

## 2022-06-24 DIAGNOSIS — Z955 Presence of coronary angioplasty implant and graft: Secondary | ICD-10-CM

## 2022-06-24 NOTE — Progress Notes (Signed)
Daily Session Note  Patient Details  Name: Nicholas Abbott MRN: [id number] Date of Birth: Feb 24, 1951 Referring Provider:   Flowsheet Row Cardiac Rehab from 06/04/2022 in [facility] Cardiac and Pulmonary Rehab  Referring Provider Donnelly Angelica MD       Encounter Date: 06/24/2022  Check In:  Session Check In - 06/24/22 1046       Check-In   Supervising physician immediately available to respond to emergencies See telemetry face sheet for immediately available ER MD    Location [facility]-Cardiac & Pulmonary Rehab    Staff Present Hope Budds, RDN, LDN;Joseph Tessie Fass, Cristopher Estimable, RN BSN    Virtual Visit No    Medication changes reported     No    Fall or balance concerns reported    No    Warm-up and Cool-down Performed on first and last piece of equipment    Resistance Training Performed Yes    VAD Patient? No    PAD/SET Patient? No      Pain Assessment   Currently in Pain? No/denies                Social History   Tobacco Use  Smoking Status Never  Smokeless Tobacco Never    Goals Met:  Independence with exercise equipment Exercise tolerated well No report of concerns or symptoms today Strength training completed today  Goals Unmet:  Not Applicable  Comments: Pt able to follow exercise prescription today without complaint.  Will continue to monitor for progression.    Dr. Emily Filbert is Medical Director for [place].  Dr. Ottie Glazier is Medical Director for [facility] Pulmonary Rehabilitation.

## 2022-06-26 ENCOUNTER — Encounter: Payer: Medicare Other | Admitting: *Deleted

## 2022-06-26 DIAGNOSIS — Z955 Presence of coronary angioplasty implant and graft: Secondary | ICD-10-CM | POA: Diagnosis not present

## 2022-06-26 NOTE — Progress Notes (Signed)
Daily Session Note  Patient Details  Name: Nicholas Abbott MRN: [id number] Date of Birth: May 25, 1951 Referring Provider:   Flowsheet Row Cardiac Rehab from 06/04/2022 in [facility] Cardiac and Pulmonary Rehab  Referring Provider Donnelly Angelica MD       Encounter Date: 06/26/2022  Check In:  Session Check In - 06/26/22 1131       Check-In   Supervising physician immediately available to respond to emergencies See telemetry face sheet for immediately available ER MD    Location [facility]-Cardiac & Pulmonary Rehab    Staff Present Alberteen Sam, MA, RCEP, CCRP, CCET;Joseph [place], RCP,RRT,BSRT;Latarshia [place] Sherryll Burger, RN BSN    Virtual Visit No    Medication changes reported     No    Fall or balance concerns reported    No    Warm-up and Cool-down Performed on first and last piece of equipment    Resistance Training Performed Yes    VAD Patient? No    PAD/SET Patient? No      Pain Assessment   Currently in Pain? No/denies                Social History   Tobacco Use  Smoking Status Never  Smokeless Tobacco Never    Goals Met:  Independence with exercise equipment Exercise tolerated well No report of concerns or symptoms today Strength training completed today  Goals Unmet:  Not Applicable  Comments: Pt able to follow exercise prescription today without complaint.  Will continue to monitor for progression.    Dr. Emily Filbert is Medical Director for [place].  Dr. Ottie Glazier is Medical Director for [facility] Pulmonary Rehabilitation.

## 2022-06-29 ENCOUNTER — Encounter: Payer: Medicare Other | Admitting: *Deleted

## 2022-06-29 DIAGNOSIS — Z955 Presence of coronary angioplasty implant and graft: Secondary | ICD-10-CM

## 2022-06-29 NOTE — Progress Notes (Signed)
Daily Session Note  Patient Details  Name: Avyaan Summer MRN: [id number] Date of Birth: [birth year] Referring Provider:   Flowsheet Row Cardiac Rehab from 06/04/2022 in [facility] Cardiac and Pulmonary Rehab  Referring Provider Donnelly Angelica MD       Encounter Date: 06/29/2022  Check In:  Session Check In - 06/29/22 1121       Check-In   Supervising physician immediately available to respond to emergencies See telemetry face sheet for immediately available ER MD    Location [facility]-Cardiac & Pulmonary Rehab    Staff Present Renita Papa, RN BSN;Joseph [place], RCP,RRT,BSRT;Kelly [place], BS, ACSM CEP, Exercise Physiologist;Noah Tickle, BS, Exercise Physiologist    Virtual Visit No    Medication changes reported     No    Fall or balance concerns reported    No    Warm-up and Cool-down Performed on first and last piece of equipment    Resistance Training Performed Yes    VAD Patient? No    PAD/SET Patient? No      Pain Assessment   Currently in Pain? No/denies                Social History   Tobacco Use  Smoking Status Never  Smokeless Tobacco Never    Goals Met:  Independence with exercise equipment Exercise tolerated well No report of concerns or symptoms today Strength training completed today  Goals Unmet:  Not Applicable  Comments: Pt able to follow exercise prescription today without complaint.  Will continue to monitor for progression.    Dr. Emily Filbert is Medical Director for [place].  Dr. Ottie Glazier is Medical Director for [facility] Pulmonary Rehabilitation.

## 2022-07-01 ENCOUNTER — Encounter: Payer: Medicare Other | Admitting: *Deleted

## 2022-07-01 DIAGNOSIS — Z955 Presence of coronary angioplasty implant and graft: Secondary | ICD-10-CM

## 2022-07-01 NOTE — Progress Notes (Signed)
Daily Session Note  Patient Details  Name: Kaydenn Mclear MRN: [id number] Date of Birth: 08-12-1951 Referring Provider:   Flowsheet Row Cardiac Rehab from 06/04/2022 in [facility] Cardiac and Pulmonary Rehab  Referring Provider Donnelly Angelica MD       Encounter Date: 07/01/2022  Check In:  Session Check In - 07/01/22 1217       Check-In   Supervising physician immediately available to respond to emergencies See telemetry face sheet for immediately available ER MD    Location [facility]-Cardiac & Pulmonary Rehab    Staff Present Nyoka Cowden, RN, BSN, Fenton Foy, BS, Exercise Physiologist;Joseph [place], RCP,RRT,BSRT;Meredith Sherryll Burger, RN BSN    Virtual Visit No    Medication changes reported     No    Fall or balance concerns reported    No    Warm-up and Cool-down Performed on first and last piece of equipment    Resistance Training Performed Yes    VAD Patient? No    PAD/SET Patient? No      Pain Assessment   Currently in Pain? No/denies                Social History   Tobacco Use  Smoking Status Never  Smokeless Tobacco Never    Goals Met:  Independence with exercise equipment Exercise tolerated well No report of concerns or symptoms today  Goals Unmet:  Not Applicable  Comments: Pt able to follow exercise prescription today without complaint.  Will continue to monitor for progression.    Dr. Emily Filbert is Medical Director for [place].  Dr. Ottie Glazier is Medical Director for [facility] Pulmonary Rehabilitation.

## 2022-07-03 ENCOUNTER — Encounter: Payer: Medicare Other | Attending: Cardiology | Admitting: *Deleted

## 2022-07-03 DIAGNOSIS — Z955 Presence of coronary angioplasty implant and graft: Secondary | ICD-10-CM | POA: Diagnosis present

## 2022-07-03 DIAGNOSIS — Z48812 Encounter for surgical aftercare following surgery on the circulatory system: Secondary | ICD-10-CM | POA: Diagnosis not present

## 2022-07-03 NOTE — Progress Notes (Signed)
Daily Session Note  Patient Details  Name: Nicholas Abbott MRN: [id number] Date of Birth: 1951/04/02 Referring Provider:   Flowsheet Row Cardiac Rehab from 06/04/2022 in [facility] Cardiac and Pulmonary Rehab  Referring Provider Donnelly Angelica MD       Encounter Date: 07/03/2022  Check In:  Session Check In - 07/03/22 1302       Check-In   Supervising physician immediately available to respond to emergencies See telemetry face sheet for immediately available ER MD    Location [facility]-Cardiac & Pulmonary Rehab    Staff Present Nyoka Cowden, RN, BSN, Lauretta Grill, RCP,RRT,BSRT    Virtual Visit No    Medication changes reported     No    Fall or balance concerns reported    No    Tobacco Cessation No Change    Warm-up and Cool-down Performed on first and last piece of equipment    Resistance Training Performed Yes    VAD Patient? No    PAD/SET Patient? No      Pain Assessment   Currently in Pain? No/denies                Social History   Tobacco Use  Smoking Status Never  Smokeless Tobacco Never    Goals Met:  Independence with exercise equipment Exercise tolerated well No report of concerns or symptoms today  Goals Unmet:  Not Applicable  Comments: Pt able to follow exercise prescription today without complaint.  Will continue to monitor for progression.    Dr. Emily Filbert is Medical Director for [place].  Dr. Ottie Glazier is Medical Director for [facility] Pulmonary Rehabilitation.

## 2022-07-08 ENCOUNTER — Encounter: Payer: Self-pay | Admitting: *Deleted

## 2022-07-08 ENCOUNTER — Encounter: Payer: Medicare Other | Admitting: *Deleted

## 2022-07-08 DIAGNOSIS — Z955 Presence of coronary angioplasty implant and graft: Secondary | ICD-10-CM | POA: Diagnosis not present

## 2022-07-08 NOTE — Progress Notes (Signed)
Daily Session Note  Patient Details  Name: Nicholas Abbott MRN: [id number] Date of Birth: November 06, 1950 Referring Provider:   Flowsheet Row Cardiac Rehab from 06/04/2022 in [facility] Cardiac and Pulmonary Rehab  Referring Provider Donnelly Angelica MD       Encounter Date: 07/08/2022  Check In:  Session Check In - 07/08/22 1427       Check-In   Supervising physician immediately available to respond to emergencies See telemetry face sheet for immediately available ER MD    Location [facility]-Cardiac & Pulmonary Rehab    Staff Present Renita Papa, RN BSN;Susanne Bice, RN, BSN, CCRP;Noah Tickle, BS, Exercise Physiologist;Joseph [place], [place]    Virtual Visit No    Medication changes reported     No    Fall or balance concerns reported    No    Warm-up and Cool-down Performed on first and last piece of equipment    Resistance Training Performed Yes    VAD Patient? No    PAD/SET Patient? No      Pain Assessment   Currently in Pain? No/denies                Social History   Tobacco Use  Smoking Status Never  Smokeless Tobacco Never    Goals Met:  Independence with exercise equipment Exercise tolerated well No report of concerns or symptoms today Strength training completed today  Goals Unmet:  Not Applicable  Comments: Pt able to follow exercise prescription today without complaint.  Will continue to monitor for progression.    Dr. Emily Filbert is Medical Director for [place].  Dr. Ottie Glazier is Medical Director for [facility] Pulmonary Rehabilitation.

## 2022-07-08 NOTE — Progress Notes (Signed)
Cardiac Individual Treatment Plan  Patient Details  Name: Nicholas Abbott MRN: [id number] Date of Birth: 05-Apr-1951 Referring Provider:   Flowsheet Row Cardiac Rehab from 06/04/2022 in [facility] Cardiac and Pulmonary Rehab  Referring Provider Donnelly Angelica MD       Initial Encounter Date:  Flowsheet Row Cardiac Rehab from 06/04/2022 in [facility] Cardiac and Pulmonary Rehab  Date 06/04/22       Visit Diagnosis: Status post coronary artery stent placement  Patient's Home Medications on Admission:  Current Outpatient Medications:    aspirin EC 81 MG tablet, Take 81 mg by mouth daily., Disp: , Rfl:    azithromycin (ZITHROMAX) 250 MG tablet, Take by mouth., Disp: , Rfl:    Cinnamon 500 MG capsule, Take 1,000 mg by mouth 2 (two) times daily as needed (blood sugar control)., Disp: , Rfl:    clopidogrel (PLAVIX) 75 MG tablet, Take 1 tablet (75 mg total) by mouth daily., Disp: 30 tablet, Rfl: 11   Cyanocobalamin (VITAMIN B-12) 5000 MCG TBDP, Take 5,000 mcg by mouth daily. , Disp: , Rfl:    empagliflozin (JARDIANCE) 10 MG TABS tablet, Take 1 tablet by mouth daily with breakfast., Disp: , Rfl:    folic acid-pyridoxine-cyancobalamin (FOLTX) 2.5-25-2 MG TABS tablet, Take 1 tablet by mouth daily., Disp: , Rfl:    hydrocortisone 2.5 % cream, APPLY TO THE GROIN DAILY ON MONDAY, WEDNESDAY, AND FRIDAY., Disp: 28 g, Rfl: 1   ketoconazole (NIZORAL) 2 % cream, APPLY TO THE GROIN AT BEDTIME, Disp: 60 g, Rfl: 1   levothyroxine (SYNTHROID) 112 MCG tablet, Take 112 mcg by mouth daily., Disp: , Rfl:    metoprolol succinate (TOPROL-XL) 25 MG 24 hr tablet, Take 0.5 tablets (12.5 mg total) by mouth daily., Disp: 15 tablet, Rfl: 11   nitroGLYCERIN (NITROSTAT) 0.4 MG SL tablet, Place under the tongue., Disp: , Rfl:    ramipril (ALTACE) 2.5 MG capsule, Take 2.5 mg by mouth daily., Disp: , Rfl:    rosuvastatin (CRESTOR) 20 MG tablet, Take by mouth., Disp: , Rfl:    rosuvastatin (CRESTOR) 40 MG tablet, Take by mouth., Disp: ,  Rfl:    terbinafine (LAMISIL) 250 MG tablet, Take 1 tablet (250 mg total) by mouth daily. (Patient not taking: Reported on 05/27/2022), Disp: 30 tablet, Rfl: 0   traMADol (ULTRAM) 50 MG tablet, , Disp: , Rfl:   Past Medical History: Past Medical History:  Diagnosis Date   Diabetes mellitus without complication (HCC)    GERD (gastroesophageal reflux disease)    Hyperlipidemia    Hypertension    Left shoulder pain    Myocardial infarction ([place])     Tobacco Use: Social History   Tobacco Use  Smoking Status Never  Smokeless Tobacco Never    Labs: Review Flowsheet        No data to display           Exercise Target Goals: Exercise Program Goal: Individual exercise prescription set using results from initial 6 min walk test and THRR while considering  patient's activity barriers and safety.   Exercise Prescription Goal: Initial exercise prescription builds to 30-45 minutes a day of aerobic activity, 2-3 days per week.  Home exercise guidelines will be given to patient during program as part of exercise prescription that the participant will acknowledge.   Education: Aerobic Exercise: - Group verbal and visual presentation on the components of exercise prescription. Introduces F.I.T.T principle from ACSM for exercise prescriptions.  Reviews F.I.T.T. principles of aerobic exercise including progression.  Written material given at graduation. Flowsheet Row Cardiac Rehab from 07/01/2022 in [facility] Cardiac and Pulmonary Rehab  Education need identified 06/04/22       Education: Resistance Exercise: - Group verbal and visual presentation on the components of exercise prescription. Introduces F.I.T.T principle from ACSM for exercise prescriptions  Reviews F.I.T.T. principles of resistance exercise including progression. Written material given at graduation.    Education: Exercise & Equipment Safety: - Individual verbal instruction and demonstration of equipment use and safety with  use of the equipment. Flowsheet Row Cardiac Rehab from 07/01/2022 in [facility] Cardiac and Pulmonary Rehab  Education need identified 06/04/22  Date 06/04/22  Educator [place]  Instruction Review Code 1- Verbalizes Understanding       Education: Exercise Physiology & General Exercise Guidelines: - Group verbal and written instruction with models to review the exercise physiology of the cardiovascular system and associated critical values. Provides general exercise guidelines with specific guidelines to those with heart or lung disease.  Flowsheet Row Cardiac Rehab from 07/01/2022 in [facility] Cardiac and Pulmonary Rehab  Education need identified 06/04/22  Date 07/01/22  Educator [facility]  Instruction Review Code 1- [employer] Understanding       Education: Flexibility, Balance, Mind/Body Relaxation: - Group verbal and visual presentation with interactive activity on the components of exercise prescription. Introduces F.I.T.T principle from ACSM for exercise prescriptions. Reviews F.I.T.T. principles of flexibility and balance exercise training including progression. Also discusses the mind body connection.  Reviews various relaxation techniques to help reduce and manage stress (i.e. Deep breathing, progressive muscle relaxation, and visualization). Balance handout provided to take home. Written material given at graduation.   Activity Barriers & Risk Stratification:  Activity Barriers & Cardiac Risk Stratification - 06/04/22 1206       Activity Barriers & Cardiac Risk Stratification   Activity Barriers Deconditioning;Muscular Weakness    Cardiac Risk Stratification Moderate             6 Minute Walk:  6 Minute Walk     Row Name 06/04/22 1206         6 Minute Walk   Phase Initial     Distance 1210 feet     Walk Time 6 minutes     # of Rest Breaks 0     MPH 2.29     METS 2.05     RPE 8     Perceived Dyspnea  1     VO2 Peak 7.19     Symptoms No     Resting HR 55 bpm     Resting BP  118/72     Resting Oxygen Saturation  94 %     Exercise Oxygen Saturation  during 6 min walk 98 %     Max Ex. HR 76 bpm     Max Ex. BP 136/64     2 Minute Post BP 126/66              Oxygen Initial Assessment:   Oxygen Re-Evaluation:   Oxygen Discharge (Final Oxygen Re-Evaluation):   Initial Exercise Prescription:  Initial Exercise Prescription - 06/04/22 1300       Date of Initial Exercise RX and Referring Provider   Date 06/04/22    Referring Provider Donnelly Angelica MD      Oxygen   Maintain Oxygen Saturation 88% or higher      Treadmill   MPH 2.3    Grade 0.5    Minutes 15    METs 2.92  Recumbant Bike   Level 1    RPM 60    Watts 20    Minutes 15    METs 2      NuStep   Level 2    SPM 80    Minutes 15    METs 2      REL-XR   Level 1    Speed 50    Minutes 15    METs 2      T5 Nustep   Level 1    SPM 80    Minutes 15    METs 2      Prescription Details   Frequency (times per week) 3    Duration Progress to 30 minutes of continuous aerobic without signs/symptoms of physical distress      Intensity   THRR 40-80% of Max Heartrate 92 - 129    Ratings of Perceived Exertion 11-13    Perceived Dyspnea 0-4      Progression   Progression Continue to progress workloads to maintain intensity without signs/symptoms of physical distress.      Resistance Training   Training Prescription Yes    Weight 4 lb    Reps 10-15             Perform Capillary Blood Glucose checks as needed.  Exercise Prescription Changes:   Exercise Prescription Changes     Row Name 06/04/22 1300 06/16/22 1500 06/19/22 1100 06/29/22 1100       Response to Exercise   Blood Pressure (Admit) 118/72 144/84 -- 122/60    Blood Pressure (Exercise) 136/64 128/76 -- 134/62    Blood Pressure (Exit) 126/66 108/62 -- 122/62    Heart Rate (Admit) 55 bpm 64 bpm -- 54 bpm    Heart Rate (Exercise) 76 bpm 103 bpm -- 100 bpm    Heart Rate (Exit) 56 bpm 63 bpm -- 77 bpm     Oxygen Saturation (Admit) 94 % -- -- --    Oxygen Saturation (Exercise) 98 % -- -- --    Oxygen Saturation (Exit) 98 % -- -- --    Rating of Perceived Exertion (Exercise) 8 13 -- 12    Perceived Dyspnea (Exercise) 1 -- -- --    Symptoms none none -- none    Comments walk test results 2nd full day of exercise -- --    Duration -- Progress to 30 minutes of  aerobic without signs/symptoms of physical distress -- Continue with 30 min of aerobic exercise without signs/symptoms of physical distress.    Intensity -- THRR unchanged -- THRR unchanged      Progression   Progression -- Continue to progress workloads to maintain intensity without signs/symptoms of physical distress. -- Continue to progress workloads to maintain intensity without signs/symptoms of physical distress.    Average METs -- 2.36 -- 2.81      Resistance Training   Training Prescription -- Yes -- Yes    Weight -- 4 lb -- 4 lb    Reps -- 10-15 -- 10-15      Interval Training   Interval Training -- No -- No      Treadmill   MPH -- 2 -- 2.3    Grade -- 0.5 -- 0.5    Minutes -- 15 -- 15    METs -- 2.67 -- 2.92      Recumbant Bike   Level -- 1 -- 9    Watts -- -- -- 36    Minutes -- 15 --  15    METs -- -- -- 2.98      NuStep   Level -- -- -- 4    Minutes -- -- -- 15    METs -- -- -- 2.9      REL-XR   Level -- 1 -- 3    Minutes -- 15 -- 15      T5 Nustep   Level -- -- -- 4    Minutes -- -- -- 15    METs -- -- -- 2.3      Home Exercise Plan   Plans to continue exercise at -- -- Home (comment)  walking, elliptical Home (comment)  walking, elliptical    Frequency -- -- Add 2 additional days to program exercise sessions. Add 2 additional days to program exercise sessions.    Initial Home Exercises Provided -- -- 06/19/22 06/19/22      Oxygen   Maintain Oxygen Saturation -- 88% or higher -- 88% or higher             Exercise Comments:   Exercise Goals and Review:   Exercise Goals     Row Name  06/04/22 1339             Exercise Goals   Increase Physical Activity Yes       Intervention Provide advice, education, support and counseling about physical activity/exercise needs.;Develop an individualized exercise prescription for aerobic and resistive training based on initial evaluation findings, risk stratification, comorbidities and participant's personal goals.       Expected Outcomes Short Term: Attend rehab on a regular basis to increase amount of physical activity.;Long Term: Add in home exercise to make exercise part of routine and to increase amount of physical activity.;Long Term: Exercising regularly at least 3-5 days a week.       Increase Strength and Stamina Yes       Intervention Provide advice, education, support and counseling about physical activity/exercise needs.;Develop an individualized exercise prescription for aerobic and resistive training based on initial evaluation findings, risk stratification, comorbidities and participant's personal goals.       Expected Outcomes Short Term: Increase workloads from initial exercise prescription for resistance, speed, and METs.;Short Term: Perform resistance training exercises routinely during rehab and add in resistance training at home;Long Term: Improve cardiorespiratory fitness, muscular endurance and strength as measured by increased METs and functional capacity (6MWT)       Able to understand and use rate of perceived exertion (RPE) scale Yes       Intervention Provide education and explanation on how to use RPE scale       Expected Outcomes Short Term: Able to use RPE daily in rehab to express subjective intensity level;Long Term:  Able to use RPE to guide intensity level when exercising independently       Able to understand and use Dyspnea scale Yes       Intervention Provide education and explanation on how to use Dyspnea scale       Expected Outcomes Short Term: Able to use Dyspnea scale daily in rehab to express  subjective sense of shortness of breath during exertion;Long Term: Able to use Dyspnea scale to guide intensity level when exercising independently       Knowledge and understanding of Target Heart Rate Range (THRR) Yes       Intervention Provide education and explanation of THRR including how the numbers were predicted and where they are located for reference  Expected Outcomes Short Term: Able to state/look up THRR;Long Term: Able to use THRR to govern intensity when exercising independently;Short Term: Able to use daily as guideline for intensity in rehab       Able to check pulse independently Yes       Intervention Review the importance of being able to check your own pulse for safety during independent exercise;Provide education and demonstration on how to check pulse in carotid and radial arteries.       Expected Outcomes Short Term: Able to explain why pulse checking is important during independent exercise;Long Term: Able to check pulse independently and accurately       Understanding of Exercise Prescription Yes       Intervention Provide education, explanation, and written materials on patient's individual exercise prescription       Expected Outcomes Short Term: Able to explain program exercise prescription;Long Term: Able to explain home exercise prescription to exercise independently                Exercise Goals Re-Evaluation :  Exercise Goals Re-Evaluation     Row Name 06/16/22 1557 06/19/22 1119 06/29/22 1140         Exercise Goal Re-Evaluation   Exercise Goals Review Increase Physical Activity;Increase Strength and Stamina;Understanding of Exercise Prescription Increase Physical Activity;Increase Strength and Stamina;Understanding of Exercise Prescription;Able to understand and use rate of perceived exertion (RPE) scale;Able to understand and use Dyspnea scale;Knowledge and understanding of Target Heart Rate Range (THRR);Able to check pulse independently Increase  Physical Activity;Increase Strength and Stamina;Understanding of Exercise Prescription     Comments Ronalee Belts is off to a good start with rehab. He has finished 2 full sessions thus far and has been able to tolerate his exercise prescription pretty well. He treadmill was a little challenging for him and we hope to see him improve his endurance on there in order to increase his workload up. Will continue to monitor as he progress. Ronalee Belts is doing well in rehab.  He is already using his elliptical at home.  Reviewed home exercise with pt today.  Pt plans to walk and use elliptical at home for exercise.  Reviewed THR, pulse, RPE, sign and symptoms, pulse oximetery and when to call 911 or MD.  Also discussed weather considerations and indoor options.  Pt voiced understanding. Ronalee Belts is doing great in rehab.  He has already increased to level 4 on the T5 Nustep and level 9 on the recumbent bike, working up to 36 watts. His overall METs have increased to 2.8. His RPEs are staying in appropriate range. Will continue to monitor.     Expected Outcomes Short: Continue working up tolerance on treadmill to reach initial goal Long: Increase overall MET level Short: Continue to add in exercise at home Long: conitnue to improve stamina Short: Increase handweights to 5 lbs Long: Continue to increase overall MET level              Discharge Exercise Prescription (Final Exercise Prescription Changes):  Exercise Prescription Changes - 06/29/22 1100       Response to Exercise   Blood Pressure (Admit) 122/60    Blood Pressure (Exercise) 134/62    Blood Pressure (Exit) 122/62    Heart Rate (Admit) 54 bpm    Heart Rate (Exercise) 100 bpm    Heart Rate (Exit) 77 bpm    Rating of Perceived Exertion (Exercise) 12    Symptoms none    Duration Continue with 30 min of aerobic exercise  without signs/symptoms of physical distress.    Intensity THRR unchanged      Progression   Progression Continue to progress workloads to  maintain intensity without signs/symptoms of physical distress.    Average METs 2.81      Resistance Training   Training Prescription Yes    Weight 4 lb    Reps 10-15      Interval Training   Interval Training No      Treadmill   MPH 2.3    Grade 0.5    Minutes 15    METs 2.92      Recumbant Bike   Level 9    Watts 36    Minutes 15    METs 2.98      NuStep   Level 4    Minutes 15    METs 2.9      REL-XR   Level 3    Minutes 15      T5 Nustep   Level 4    Minutes 15    METs 2.3      Home Exercise Plan   Plans to continue exercise at Home (comment)   walking, elliptical   Frequency Add 2 additional days to program exercise sessions.    Initial Home Exercises Provided 06/19/22      Oxygen   Maintain Oxygen Saturation 88% or higher             Nutrition:  Target Goals: Understanding of nutrition guidelines, daily intake of sodium '1500mg'$ , cholesterol '200mg'$ , calories 30% from fat and 7% or less from saturated fats, daily to have 5 or more servings of fruits and vegetables.  Education: All About Nutrition: -Group instruction provided by verbal, written material, interactive activities, discussions, models, and posters to present general guidelines for heart healthy nutrition including fat, fiber, MyPlate, the role of sodium in heart healthy nutrition, utilization of the nutrition label, and utilization of this knowledge for meal planning. Follow up email sent as well. Written material given at graduation.   Biometrics:  Pre Biometrics - 06/04/22 1206       Pre Biometrics   Height 6' 1.75" (1.873 m)    Weight 264 lb 14.4 oz (120.2 kg)    BMI (Calculated) 34.25    Single Leg Stand 3.5 seconds              Nutrition Therapy Plan and Nutrition Goals:  Nutrition Therapy & Goals - 06/15/22 1205       Nutrition Therapy   RD appointment deferred Yes   Pt reports following diet for his diabetes and does not wish to meet with RD at this time. Will  continue to follow up.     Personal Nutrition Goals   Nutrition Goal Pt reports following diet for his diabetes and does not wish to meet with RD at this time. Will continue to follow up.             Nutrition Assessments:  MEDIFICTS Score Key: ?70 Need to make dietary changes  40-70 Heart Healthy Diet ? 40 Therapeutic Level Cholesterol Diet  Flowsheet Row Cardiac Rehab from 06/04/2022 in [facility] Cardiac and Pulmonary Rehab  Picture Your Plate Total Score on Admission 65      Picture Your Plate Scores: <04 Unhealthy dietary pattern with much room for improvement. 41-50 Dietary pattern unlikely to meet recommendations for good health and room for improvement. 51-60 More healthful dietary pattern, with some room for improvement.  >60 Healthy dietary pattern, although  there may be some specific behaviors that could be improved.    Nutrition Goals Re-Evaluation:  Nutrition Goals Re-Evaluation     [place] Name 06/19/22 1122             Goals   Nutrition Goal Pt reports following diet for his diabetes and does not wish to meet with RD at this time. Will continue to follow up.       Comment Ronalee Belts is good about watching his diet. He limits his carbs but still get lots of fruits and vegetables.  He is good about getting lean protein and balancing carbs.       Expected Outcome Continue to manage diet for diabetes and his heart                Nutrition Goals Discharge (Final Nutrition Goals Re-Evaluation):  Nutrition Goals Re-Evaluation - 06/19/22 1122       Goals   Nutrition Goal Pt reports following diet for his diabetes and does not wish to meet with RD at this time. Will continue to follow up.    Comment Ronalee Belts is good about watching his diet. He limits his carbs but still get lots of fruits and vegetables.  He is good about getting lean protein and balancing carbs.    Expected Outcome Continue to manage diet for diabetes and his heart             Psychosocial: Target  Goals: Acknowledge presence or absence of significant depression and/or stress, maximize coping skills, provide positive support system. Participant is able to verbalize types and ability to use techniques and skills needed for reducing stress and depression.   Education: Stress, Anxiety, and Depression - Group verbal and visual presentation to define topics covered.  Reviews how body is impacted by stress, anxiety, and depression.  Also discusses healthy ways to reduce stress and to treat/manage anxiety and depression.  Written material given at graduation. Flowsheet Row Cardiac Rehab from 07/01/2022 in [facility] Cardiac and Pulmonary Rehab  Date 06/24/22  Educator [facility]  Instruction Review Code 1- [employer] Understanding       Education: Sleep Hygiene -Provides group verbal and written instruction about how sleep can affect your health.  Define sleep hygiene, discuss sleep cycles and impact of sleep habits. Review good sleep hygiene tips.    Initial Review & Psychosocial Screening:  Initial Psych Review & Screening - 05/27/22 1312       Initial Review   Current issues with None Identified      Family Dynamics   Good Support System? Yes   wife     Barriers   Psychosocial barriers to participate in program There are no identifiable barriers or psychosocial needs.      Screening Interventions   Interventions Encouraged to exercise;To provide support and resources with identified psychosocial needs;Provide feedback about the scores to participant    Expected Outcomes Short Term goal: Utilizing psychosocial counselor, staff and physician to assist with identification of specific Stressors or current issues interfering with healing process. Setting desired goal for each stressor or current issue identified.;Long Term Goal: Stressors or current issues are controlled or eliminated.;Short Term goal: Identification and review with participant of any Quality of Life or Depression concerns found by  scoring the questionnaire.;Long Term goal: The participant improves quality of Life and PHQ9 Scores as seen by post scores and/or verbalization of changes             Quality of Life Scores:   Quality  of Life - 06/04/22 1136       Quality of Life   Select Quality of Life      Quality of Life Scores   Health/Function Pre 16.13 %    Socioeconomic Pre 21.26 %    Psych/Spiritual Pre 21.71 %    Family Pre 25.4 %    GLOBAL Pre 19.83 %            Scores of 19 and below usually indicate a poorer quality of life in these areas.  A difference of  2-3 points is a clinically meaningful difference.  A difference of 2-3 points in the total score of the Quality of Life Index has been associated with significant improvement in overall quality of life, self-image, physical symptoms, and general health in studies assessing change in quality of life.  PHQ-9: Review Flowsheet       06/04/2022 05/04/2017  Depression screen PHQ 2/9  Decreased Interest 0 0  Down, Depressed, Hopeless 0 0  PHQ - 2 Score 0 0  Altered sleeping 1 -  Tired, decreased energy 1 -  Change in appetite 0 -  Feeling bad or failure about yourself  1 -  Trouble concentrating 0 -  Moving slowly or fidgety/restless 0 -  Suicidal thoughts 0 -  PHQ-9 Score 3 -  Difficult doing work/chores Somewhat difficult -   Interpretation of Total Score  Total Score Depression Severity:  1-4 = Minimal depression, 5-9 = Mild depression, 10-14 = Moderate depression, 15-19 = Moderately severe depression, 20-27 = Severe depression   Psychosocial Evaluation and Intervention:  Psychosocial Evaluation - 05/27/22 1324       Psychosocial Evaluation & Interventions   Interventions Encouraged to exercise with the program and follow exercise prescription    Comments Ronalee Belts ahs no barriers to attending the program. He lives with his wife and she is his support. They have been married over 98 years. He wants to learn about taking care of himself  with heart disease. He does have diabetes and has attended the Diabetes Education in the past.  He is consciuosly working on weight loss. He has about 15 lbs more he would like to lose. He is ready to get started    Expected Outcomes STG: mike attends all scheduled sessions. He gains knowledge to work on caring for himself as he lives with heart disease. LTG Ronalee Belts continues with exercsie progression and caring for himself in a heart healthy manor.    Continue Psychosocial Services  Follow up required by staff             Psychosocial Re-Evaluation:  Psychosocial Re-Evaluation     [place] Name 06/19/22 1120             Psychosocial Re-Evaluation   Current issues with Current Stress Concerns       Comments Ronalee Belts is doing well in rehab.  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  His two dogs are getting near the end of their life and are in heart failure.  He is coping with their inevitable loss.  He sleeps good for most part and has rare nights of not sleeping as well.       Expected Outcomes Short: conitnue to cope with work Long: Exercise for mental boost                Psychosocial Discharge (Final Psychosocial Re-Evaluation):  Psychosocial Re-Evaluation - 06/19/22 1120  Psychosocial Re-Evaluation   Current issues with Current Stress Concerns    Comments Ronalee Belts is doing well in rehab.  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  His two dogs are getting near the end of their life and are in heart failure.  He is coping with their inevitable loss.  He sleeps good for most part and has rare nights of not sleeping as well.    Expected Outcomes Short: conitnue to cope with work Long: Exercise for mental boost             Vocational Rehabilitation: Provide vocational rehab assistance to qualifying candidates.   Vocational Rehab Evaluation & Intervention:  Vocational Rehab - 05/27/22 1328        Initial Vocational Rehab Evaluation & Intervention   Assessment shows need for Vocational Rehabilitation No      Vocational Rehab Re-Evaulation   Comments Retierd      Discharge Vocational Rehab   Discharge Vocational Rehabilitation RETIRED             Education: Education Goals: Education classes will be provided on a variety of topics geared toward better understanding of heart health and risk factor modification. Participant will state understanding/return demonstration of topics presented as noted by education test scores.  Learning Barriers/Preferences:   General Cardiac Education Topics:  AED/CPR: - Group verbal and written instruction with the use of models to demonstrate the basic use of the AED with the basic ABC's of resuscitation.   Anatomy and Cardiac Procedures: - Group verbal and visual presentation and models provide information about basic cardiac anatomy and function. Reviews the testing methods done to diagnose heart disease and the outcomes of the test results. Describes the treatment choices: Medical Management, Angioplasty, or Coronary Bypass Surgery for treating various heart conditions including Myocardial Infarction, Angina, Valve Disease, and Cardiac Arrhythmias.  Written material given at graduation. Flowsheet Row Cardiac Rehab from 07/01/2022 in [facility] Cardiac and Pulmonary Rehab  Education need identified 06/04/22       Medication Safety: - Group verbal and visual instruction to review commonly prescribed medications for heart and lung disease. Reviews the medication, class of the drug, and side effects. Includes the steps to properly store meds and maintain the prescription regimen.  Written material given at graduation.   Intimacy: - Group verbal instruction through game format to discuss how heart and lung disease can affect sexual intimacy. Written material given at graduation..   Know Your Numbers and Heart Failure: - Group verbal and  visual instruction to discuss disease risk factors for cardiac and pulmonary disease and treatment options.  Reviews associated critical values for Overweight/Obesity, Hypertension, Cholesterol, and Diabetes.  Discusses basics of heart failure: signs/symptoms and treatments.  Introduces Heart Failure Zone chart for action plan for heart failure.  Written material given at graduation. Flowsheet Row Cardiac Rehab from 07/01/2022 in [facility] Cardiac and Pulmonary Rehab  Date 06/10/22  Educator SB  Instruction Review Code 1- Verbalizes Understanding       Infection Prevention: - Provides verbal and written material to individual with discussion of infection control including proper hand washing and proper equipment cleaning during exercise session. Flowsheet Row Cardiac Rehab from 07/01/2022 in [facility] Cardiac and Pulmonary Rehab  Education need identified 06/04/22  Date 06/04/22  Educator [place]  Instruction Review Code 1- Verbalizes Understanding       Falls Prevention: - Provides verbal and written material to individual with discussion of falls prevention and  safety. Flowsheet Row Cardiac Rehab from 05/27/2022 in [facility] Cardiac and Pulmonary Rehab  Date 05/27/22  Educator SB  Instruction Review Code 1- Verbalizes Understanding       Other: -Provides group and verbal instruction on various topics (see comments)   Knowledge Questionnaire Score:  Knowledge Questionnaire Score - 06/04/22 1136       Knowledge Questionnaire Score   Pre Score 24/26             Core Components/Risk Factors/Patient Goals at Admission:  Personal Goals and Risk Factors at Admission - 06/04/22 1340       Core Components/Risk Factors/Patient Goals on Admission    Weight Management Yes;Weight Loss;Obesity    Intervention Weight Management: Develop a combined nutrition and exercise program designed to reach desired caloric intake, while maintaining appropriate intake of nutrient and fiber, sodium and fats, and  appropriate energy expenditure required for the weight goal.;Weight Management: Provide education and appropriate resources to help participant work on and attain dietary goals.;Weight Management/Obesity: Establish reasonable short term and long term weight goals.    Admit Weight 264 lb (119.7 kg)    Goal Weight: Short Term 259 lb (117.5 kg)    Goal Weight: Long Term 250 lb (113.4 kg)    Expected Outcomes Short Term: Continue to assess and modify interventions until short term weight is achieved;Long Term: Adherence to nutrition and physical activity/exercise program aimed toward attainment of established weight goal;Weight Loss: Understanding of general recommendations for a balanced deficit meal plan, which promotes 1-2 lb weight loss per week and includes a negative energy balance of [phone] kcal/d;Understanding recommendations for meals to include 15-35% energy as protein, 25-35% energy from fat, 35-60% energy from carbohydrates, less than $RemoveB'200mg'vWfhcpSA$  of dietary cholesterol, 20-35 gm of total fiber daily;Understanding of distribution of calorie intake throughout the day with the consumption of 4-5 meals/snacks    Diabetes Yes    Intervention Provide education about signs/symptoms and action to take for hypo/hyperglycemia.;Provide education about proper nutrition, including hydration, and aerobic/resistive exercise prescription along with prescribed medications to achieve blood glucose in normal ranges: Fasting glucose 65-99 mg/dL    Expected Outcomes Short Term: Participant verbalizes understanding of the signs/symptoms and immediate care of hyper/hypoglycemia, proper foot care and importance of medication, aerobic/resistive exercise and nutrition plan for blood glucose control.;Long Term: Attainment of HbA1C < 7%.    Hypertension Yes    Intervention Provide education on lifestyle modifcations including regular physical activity/exercise, weight management, moderate sodium restriction and increased  consumption of fresh fruit, vegetables, and low fat dairy, alcohol moderation, and smoking cessation.;Monitor prescription use compliance.    Expected Outcomes Short Term: Continued assessment and intervention until BP is < 140/87mm HG in hypertensive participants. < 130/52mm HG in hypertensive participants with diabetes, heart failure or chronic kidney disease.;Long Term: Maintenance of blood pressure at goal levels.    Lipids Yes    Intervention Provide education and support for participant on nutrition & aerobic/resistive exercise along with prescribed medications to achieve LDL '70mg'$ , HDL >$Remo'40mg'pVSBh$ .    Expected Outcomes Short Term: Participant states understanding of desired cholesterol values and is compliant with medications prescribed. Participant is following exercise prescription and nutrition guidelines.;Long Term: Cholesterol controlled with medications as prescribed, with individualized exercise RX and with personalized nutrition plan. Value goals: LDL < $Rem'70mg'xAQr$ , HDL > 40 mg.             Education:Diabetes - Individual verbal and written instruction to review signs/symptoms of diabetes, desired ranges of glucose level fasting,  after meals and with exercise. Acknowledge that pre and post exercise glucose checks will be done for 3 sessions at entry of program. [place] from 07/01/2022 in [facility] Cardiac and Pulmonary Rehab  Education need identified 06/04/22  Date 06/04/22  Educator [place]  Instruction Review Code 1- Verbalizes Understanding       Core Components/Risk Factors/Patient Goals Review:   Goals and Risk Factor Review     Row Name 06/19/22 1122             Core Components/Risk Factors/Patient Goals Review   Personal Goals Review Weight Management/Obesity;Hypertension;Diabetes;Lipids       Review Ronalee Belts is doing well in rehab.  His weight is staying steady.  His pressures have been good here in class.  He does check them at home but his machine at home does not  like his PVCs.  His sugars are doing well.  He is hoping to get his A1c down under 6 when he checks again in October.       Expected Outcomes Short: Continue to work on weight loss Long: Continue to monitor risk factors                Core Components/Risk Factors/Patient Goals at Discharge (Final Review):   Goals and Risk Factor Review - 06/19/22 1122       Core Components/Risk Factors/Patient Goals Review   Personal Goals Review Weight Management/Obesity;Hypertension;Diabetes;Lipids    Review Ronalee Belts is doing well in rehab.  His weight is staying steady.  His pressures have been good here in class.  He does check them at home but his machine at home does not like his PVCs.  His sugars are doing well.  He is hoping to get his A1c down under 6 when he checks again in October.    Expected Outcomes Short: Continue to work on weight loss Long: Continue to monitor risk factors             ITP Comments:  ITP Comments     Row Name 05/27/22 1329 06/04/22 1134 06/04/22 1341 07/08/22 1406     ITP Comments Virtual orientation call completed today. he has an appointment on Date: 06/04/2022  for EP eval and gym Orientation.  Documentation of diagnosis can be found in [facility] Date: 05/13/2022 . Completed 6MWT and gym orientation. Initial ITP created and sent for review to Dr. Ramonita Lab. EKG Strips sent for review. 30 Day review completed. Medical Director ITP review done, changes made as directed, and signed approval by Medical Director.             Comments:

## 2022-07-10 ENCOUNTER — Encounter: Payer: Medicare Other | Admitting: *Deleted

## 2022-07-10 DIAGNOSIS — Z955 Presence of coronary angioplasty implant and graft: Secondary | ICD-10-CM

## 2022-07-10 NOTE — Progress Notes (Signed)
Daily Session Note  Patient Details  Name: Nicholas Abbott MRN: [id number] Date of Birth: August 11, 1951 Referring Provider:   Flowsheet Row Cardiac Rehab from 06/04/2022 in [facility] Cardiac and Pulmonary Rehab  Referring Provider Donnelly Angelica MD       Encounter Date: 07/10/2022  Check In:  Session Check In - 07/10/22 1149       Check-In   Supervising physician immediately available to respond to emergencies See telemetry face sheet for immediately available ER MD    Location [facility]-Cardiac & Pulmonary Rehab    Staff Present Heath Lark, RN, BSN, CCRP;Jessica [place], MA, RCEP, CCRP, CCET;Joseph [place], [place]    Virtual Visit No    Medication changes reported     No    Fall or balance concerns reported    No    Warm-up and Cool-down Performed on first and last piece of equipment    Resistance Training Performed Yes    VAD Patient? No    PAD/SET Patient? No      Pain Assessment   Currently in Pain? No/denies                Social History   Tobacco Use  Smoking Status Never  Smokeless Tobacco Never    Goals Met:  Independence with exercise equipment Exercise tolerated well No report of concerns or symptoms today  Goals Unmet:  Not Applicable  Comments: Pt able to follow exercise prescription today without complaint.  Will continue to monitor for progression.    Dr. Emily Filbert is Medical Director for [place].  Dr. Ottie Glazier is Medical Director for [facility] Pulmonary Rehabilitation.

## 2022-07-13 ENCOUNTER — Encounter: Payer: Medicare Other | Admitting: *Deleted

## 2022-07-13 DIAGNOSIS — Z955 Presence of coronary angioplasty implant and graft: Secondary | ICD-10-CM

## 2022-07-13 NOTE — Progress Notes (Signed)
Daily Session Note  Patient Details  Name: Nicholas Abbott MRN: [id number] Date of Birth: 02/26/1951 Referring Provider:   Flowsheet Row Cardiac Rehab from 06/04/2022 in [facility] Cardiac and Pulmonary Rehab  Referring Provider Donnelly Angelica MD       Encounter Date: 07/13/2022  Check In:  Session Check In - 07/13/22 1123       Check-In   Supervising physician immediately available to respond to emergencies See telemetry face sheet for immediately available ER MD    Location [facility]-Cardiac & Pulmonary Rehab    Staff Present Renita Papa, RN Moises Blood, BS, ACSM CEP, Exercise Physiologist;Noah Tickle, BS, Exercise Physiologist;Kara Eliezer [place], MS, ASCM CEP, Exercise Physiologist    Virtual Visit No    Medication changes reported     No    Fall or balance concerns reported    No    Warm-up and Cool-down Performed on first and last piece of equipment    Resistance Training Performed Yes    VAD Patient? No    PAD/SET Patient? No      Pain Assessment   Currently in Pain? No/denies                Social History   Tobacco Use  Smoking Status Never  Smokeless Tobacco Never    Goals Met:  Independence with exercise equipment Exercise tolerated well No report of concerns or symptoms today Strength training completed today  Goals Unmet:  Not Applicable  Comments: Pt able to follow exercise prescription today without complaint.  Will continue to monitor for progression.    Dr. Emily Filbert is Medical Director for [place].  Dr. Ottie Glazier is Medical Director for [facility] Pulmonary Rehabilitation.

## 2022-07-15 ENCOUNTER — Encounter: Payer: Medicare Other | Admitting: *Deleted

## 2022-07-15 DIAGNOSIS — Z955 Presence of coronary angioplasty implant and graft: Secondary | ICD-10-CM | POA: Diagnosis not present

## 2022-07-15 NOTE — Progress Notes (Signed)
Daily Session Note  Patient Details  Name: Nicholas Abbott MRN: [id number] Date of Birth: Jan 22, 1951 Referring Provider:   Flowsheet Row Cardiac Rehab from 06/04/2022 in [facility] Cardiac and Pulmonary Rehab  Referring Provider Donnelly Angelica MD       Encounter Date: 07/15/2022  Check In:  Session Check In - 07/15/22 1257       Check-In   Supervising physician immediately available to respond to emergencies See telemetry face sheet for immediately available ER MD    Location [facility]-Cardiac & Pulmonary Rehab    Staff Present Heath Lark, RN, BSN, CCRP;Noah Tickle, BS, Exercise Physiologist;Meredith Sherryll Burger, RN BSN;Joseph [place], RCP,RRT,BSRT;Other   Kathee Delton ,BS,Exercise Physiologist  Darlyne Russian RN,ADN   Virtual Visit No    Medication changes reported     No    Fall or balance concerns reported    No    Warm-up and Cool-down Performed on first and last piece of equipment    Resistance Training Performed Yes    VAD Patient? No    PAD/SET Patient? No      Pain Assessment   Currently in Pain? No/denies                Social History   Tobacco Use  Smoking Status Never  Smokeless Tobacco Never    Goals Met:  Independence with exercise equipment Exercise tolerated well No report of concerns or symptoms today  Goals Unmet:  Not Applicable  Comments: Pt able to follow exercise prescription today without complaint.  Will continue to monitor for progression.    Dr. Emily Filbert is Medical Director for [place].  Dr. Ottie Glazier is Medical Director for [facility] Pulmonary Rehabilitation.

## 2022-07-17 ENCOUNTER — Encounter: Payer: Medicare Other | Admitting: *Deleted

## 2022-07-17 DIAGNOSIS — Z955 Presence of coronary angioplasty implant and graft: Secondary | ICD-10-CM

## 2022-07-17 NOTE — Progress Notes (Signed)
Daily Session Note  Patient Details  Name: Nicholas Abbott MRN: [id number] Date of Birth: Apr 09, 1951 Referring Provider:   Flowsheet Row Cardiac Rehab from 06/04/2022 in [facility] Cardiac and Pulmonary Rehab  Referring Provider Donnelly Angelica MD       Encounter Date: 07/17/2022  Check In:  Session Check In - 07/17/22 1125       Check-In   Supervising physician immediately available to respond to emergencies See telemetry face sheet for immediately available ER MD    Location [facility]-Cardiac & Pulmonary Rehab    Staff Present Heath Lark, RN, BSN, CCRP;Joseph [place], RCP,RRT,BSRT;Other   Kathee Delton BS, Exercise Physiologist   Virtual Visit No    Medication changes reported     No    Fall or balance concerns reported    No    Warm-up and Cool-down Performed on first and last piece of equipment    Resistance Training Performed Yes    VAD Patient? No    PAD/SET Patient? No      Pain Assessment   Currently in Pain? No/denies                Social History   Tobacco Use  Smoking Status Never  Smokeless Tobacco Never    Goals Met:  Independence with exercise equipment Exercise tolerated well No report of concerns or symptoms today  Goals Unmet:  Not Applicable  Comments: Pt able to follow exercise prescription today without complaint.  Will continue to monitor for progression.    Dr. Emily Filbert is Medical Director for [place].  Dr. Ottie Glazier is Medical Director for [facility] Pulmonary Rehabilitation.

## 2022-07-20 ENCOUNTER — Encounter: Payer: Medicare Other | Admitting: *Deleted

## 2022-07-20 DIAGNOSIS — Z955 Presence of coronary angioplasty implant and graft: Secondary | ICD-10-CM

## 2022-07-20 NOTE — Progress Notes (Signed)
Daily Session Note  Patient Details  Name: Nicholas Abbott MRN: [id number] Date of Birth: [birth year] Referring Provider:   Flowsheet Row Cardiac Rehab from 06/04/2022 in [facility] Cardiac and Pulmonary Rehab  Referring Provider Donnelly Angelica MD       Encounter Date: 07/20/2022  Check In:  Session Check In - 07/20/22 1113       Check-In   Supervising physician immediately available to respond to emergencies See telemetry face sheet for immediately available ER MD    Location [facility]-Cardiac & Pulmonary Rehab    Staff Present Renita Papa, RN Moises Blood, BS, ACSM CEP, Exercise Physiologist;Noah Tickle, BS, Exercise Physiologist    Virtual Visit No    Medication changes reported     No    Fall or balance concerns reported    No    Tobacco Cessation No Change    Warm-up and Cool-down Performed on first and last piece of equipment    Resistance Training Performed Yes    VAD Patient? No    PAD/SET Patient? No      Pain Assessment   Currently in Pain? No/denies                Social History   Tobacco Use  Smoking Status Never  Smokeless Tobacco Never    Goals Met:  Independence with exercise equipment Exercise tolerated well No report of concerns or symptoms today Strength training completed today  Goals Unmet:  Not Applicable  Comments: Pt able to follow exercise prescription today without complaint.  Will continue to monitor for progression.    Dr. Emily Filbert is Medical Director for [place].  Dr. Ottie Glazier is Medical Director for [facility] Pulmonary Rehabilitation.

## 2022-07-22 ENCOUNTER — Encounter: Payer: Medicare Other | Admitting: *Deleted

## 2022-07-22 DIAGNOSIS — Z955 Presence of coronary angioplasty implant and graft: Secondary | ICD-10-CM

## 2022-07-22 NOTE — Progress Notes (Signed)
Daily Session Note  Patient Details  Name: Nicholas Abbott MRN: [id number] Date of Birth: 1950/11/25 Referring Provider:   Flowsheet Row Cardiac Rehab from 06/04/2022 in [facility] Cardiac and Pulmonary Rehab  Referring Provider Donnelly Angelica MD       Encounter Date: 07/22/2022  Check In:  Session Check In - 07/22/22 1027       Check-In   Supervising physician immediately available to respond to emergencies See telemetry face sheet for immediately available ER MD    Location [facility]-Cardiac & Pulmonary Rehab    Staff Present Renita Papa, RN BSN;Joseph [place], RCP,RRT,BSRT;Noah [place], [place], Exercise Physiologist    Virtual Visit No    Medication changes reported     No    Fall or balance concerns reported    No    Warm-up and Cool-down Performed on first and last piece of equipment    Resistance Training Performed Yes    VAD Patient? No    PAD/SET Patient? No      Pain Assessment   Currently in Pain? No/denies                Social History   Tobacco Use  Smoking Status Never  Smokeless Tobacco Never    Goals Met:  Independence with exercise equipment Exercise tolerated well No report of concerns or symptoms today Strength training completed today  Goals Unmet:  Not Applicable  Comments: Pt able to follow exercise prescription today without complaint.  Will continue to monitor for progression.    Dr. Emily Filbert is Medical Director for [place].  Dr. Ottie Glazier is Medical Director for [facility] Pulmonary Rehabilitation.

## 2022-07-24 ENCOUNTER — Encounter: Payer: Medicare Other | Admitting: *Deleted

## 2022-07-24 DIAGNOSIS — Z955 Presence of coronary angioplasty implant and graft: Secondary | ICD-10-CM

## 2022-07-24 NOTE — Progress Notes (Signed)
Daily Session Note  Patient Details  Name: Nicholas Abbott MRN: [id number] Date of Birth: [birth year] Referring Provider:   Flowsheet Row Cardiac Rehab from 06/04/2022 in [facility] Cardiac and Pulmonary Rehab  Referring Provider Donnelly Angelica MD       Encounter Date: 07/24/2022  Check In:  Session Check In - 07/24/22 1149       Check-In   Supervising physician immediately available to respond to emergencies See telemetry face sheet for immediately available ER MD    Location [facility]-Cardiac & Pulmonary Rehab    Staff Present Heath Lark, RN, BSN, CCRP;Joseph [place], RCP,RRT,BSRT;Jessica [place], [place], [place], CCRP, CCET    Virtual Visit No    Medication changes reported     No    Fall or balance concerns reported    No    Warm-up and Cool-down Performed on first and last piece of equipment    Resistance Training Performed Yes    VAD Patient? No    PAD/SET Patient? No      Pain Assessment   Currently in Pain? No/denies                Social History   Tobacco Use  Smoking Status Never  Smokeless Tobacco Never    Goals Met:  Independence with exercise equipment Exercise tolerated well No report of concerns or symptoms today  Goals Unmet:  Not Applicable  Comments: Pt able to follow exercise prescription today without complaint.  Will continue to monitor for progression.    Dr. Emily Filbert is Medical Director for [place].  Dr. Ottie Glazier is Medical Director for [facility] Pulmonary Rehabilitation.

## 2022-07-27 ENCOUNTER — Encounter: Payer: Medicare Other | Admitting: *Deleted

## 2022-07-27 DIAGNOSIS — Z955 Presence of coronary angioplasty implant and graft: Secondary | ICD-10-CM | POA: Diagnosis not present

## 2022-07-27 NOTE — Progress Notes (Signed)
Daily Session Note  Patient Details  Name: Nicholas Abbott MRN: [id number] Date of Birth: [birth year] Referring Provider:   Flowsheet Row Cardiac Rehab from 06/04/2022 in [facility] Cardiac and Pulmonary Rehab  Referring Provider Donnelly Angelica MD       Encounter Date: 07/27/2022  Check In:  Session Check In - 07/27/22 1110       Check-In   Supervising physician immediately available to respond to emergencies See telemetry face sheet for immediately available ER MD    Location [facility]-Cardiac & Pulmonary Rehab    Staff Present Coralie Keens, MS, ASCM CEP, Exercise Physiologist;Kelly Amedeo Plenty, BS, ACSM CEP, Exercise Physiologist;Jessica [place], MA, RCEP, CCRP, CCET   Darlyne Russian, RN   Virtual Visit No    Medication changes reported     No    Fall or balance concerns reported    No    Warm-up and Cool-down Performed on first and last piece of equipment    Resistance Training Performed Yes    VAD Patient? No    PAD/SET Patient? No      Pain Assessment   Currently in Pain? No/denies                Social History   Tobacco Use  Smoking Status Never  Smokeless Tobacco Never    Goals Met:  Independence with exercise equipment Exercise tolerated well Personal goals reviewed No report of concerns or symptoms today Strength training completed today  Goals Unmet:  Not Applicable  Comments: Pt able to follow exercise prescription today without complaint.  Will continue to monitor for progression.    Dr. Emily Filbert is Medical Director for [place].  Dr. Ottie Glazier is Medical Director for [facility] Pulmonary Rehabilitation.

## 2022-07-29 ENCOUNTER — Encounter: Payer: Medicare Other | Admitting: *Deleted

## 2022-07-29 DIAGNOSIS — Z955 Presence of coronary angioplasty implant and graft: Secondary | ICD-10-CM | POA: Diagnosis not present

## 2022-07-29 NOTE — Progress Notes (Signed)
Daily Session Note  Patient Details  Name: Nicholas Abbott MRN: [id number] Date of Birth: [birth year] Referring Provider:   Flowsheet Row Cardiac Rehab from 06/04/2022 in [facility] Cardiac and Pulmonary Rehab  Referring Provider Donnelly Angelica MD       Encounter Date: 07/29/2022  Check In:  Session Check In - 07/29/22 1134       Check-In   Supervising physician immediately available to respond to emergencies See telemetry face sheet for immediately available ER MD    Location [facility]-Cardiac & Pulmonary Rehab    Staff Present Justin Mend, RCP,RRT,BSRT;Noah Tickle, BS, Exercise Physiologist;Other   Darlyne Russian, RN   Virtual Visit No    Medication changes reported     No    Fall or balance concerns reported    No    Warm-up and Cool-down Performed on first and last piece of equipment    Resistance Training Performed Yes    VAD Patient? No    PAD/SET Patient? No      Pain Assessment   Currently in Pain? No/denies                Social History   Tobacco Use  Smoking Status Never  Smokeless Tobacco Never    Goals Met:  Independence with exercise equipment Exercise tolerated well No report of concerns or symptoms today Strength training completed today  Goals Unmet:  Not Applicable  Comments: Pt able to follow exercise prescription today without complaint.  Will continue to monitor for progression.    Dr. Emily Filbert is Medical Director for [place].  Dr. Ottie Glazier is Medical Director for [facility] Pulmonary Rehabilitation.

## 2022-08-05 ENCOUNTER — Encounter: Payer: Medicare Other | Attending: Cardiology | Admitting: *Deleted

## 2022-08-05 ENCOUNTER — Encounter: Payer: Self-pay | Admitting: *Deleted

## 2022-08-05 DIAGNOSIS — Z48812 Encounter for surgical aftercare following surgery on the circulatory system: Secondary | ICD-10-CM | POA: Diagnosis not present

## 2022-08-05 DIAGNOSIS — Z955 Presence of coronary angioplasty implant and graft: Secondary | ICD-10-CM | POA: Diagnosis present

## 2022-08-05 NOTE — Progress Notes (Signed)
Daily Session Note  Patient Details  Name: Nicholas Abbott MRN: [id number] Date of Birth: 07-12-1951 Referring Provider:   Flowsheet Row Cardiac Rehab from 06/04/2022 in [facility] Cardiac and Pulmonary Rehab  Referring Provider Donnelly Angelica MD       Encounter Date: 08/05/2022  Check In:  Session Check In - 08/05/22 1122       Check-In   Supervising physician immediately available to respond to emergencies See telemetry face sheet for immediately available ER MD    Location [facility]-Cardiac & Pulmonary Rehab    Staff Present Justin Mend, RCP,RRT,BSRT;Noah Tickle, BS, Exercise Physiologist;Kara Eliezer Bottom, MS, ASCM CEP, Exercise Physiologist;Megan Tamala Julian, RN, ADN;Evangeline Utley Sherryll Burger, RN BSN    Virtual Visit No    Medication changes reported     No    Fall or balance concerns reported    No    Warm-up and Cool-down Performed on first and last piece of equipment    Resistance Training Performed Yes    VAD Patient? No    PAD/SET Patient? No      Pain Assessment   Currently in Pain? No/denies                Social History   Tobacco Use  Smoking Status Never  Smokeless Tobacco Never    Goals Met:  Independence with exercise equipment Exercise tolerated well No report of concerns or symptoms today Strength training completed today  Goals Unmet:  Not Applicable  Comments: Pt able to follow exercise prescription today without complaint.  Will continue to monitor for progression.    Dr. Emily Filbert is Medical Director for [place].  Dr. Ottie Glazier is Medical Director for [facility] Pulmonary Rehabilitation.

## 2022-08-05 NOTE — Progress Notes (Signed)
Cardiac Individual Treatment Plan  Patient Details  Name: Nicholas Abbott MRN: [id number] Date of Birth: 05-Apr-1951 Referring Provider:   Flowsheet Row Cardiac Rehab from 06/04/2022 in [facility] Cardiac and Pulmonary Rehab  Referring Provider Donnelly Angelica MD       Initial Encounter Date:  Flowsheet Row Cardiac Rehab from 06/04/2022 in [facility] Cardiac and Pulmonary Rehab  Date 06/04/22       Visit Diagnosis: Status post coronary artery stent placement  Patient's Home Medications on Admission:  Current Outpatient Medications:    aspirin EC 81 MG tablet, Take 81 mg by mouth daily., Disp: , Rfl:    azithromycin (ZITHROMAX) 250 MG tablet, Take by mouth., Disp: , Rfl:    Cinnamon 500 MG capsule, Take 1,000 mg by mouth 2 (two) times daily as needed (blood sugar control)., Disp: , Rfl:    clopidogrel (PLAVIX) 75 MG tablet, Take 1 tablet (75 mg total) by mouth daily., Disp: 30 tablet, Rfl: 11   Cyanocobalamin (VITAMIN B-12) 5000 MCG TBDP, Take 5,000 mcg by mouth daily. , Disp: , Rfl:    empagliflozin (JARDIANCE) 10 MG TABS tablet, Take 1 tablet by mouth daily with breakfast., Disp: , Rfl:    folic acid-pyridoxine-cyancobalamin (FOLTX) 2.5-25-2 MG TABS tablet, Take 1 tablet by mouth daily., Disp: , Rfl:    hydrocortisone 2.5 % cream, APPLY TO THE GROIN DAILY ON MONDAY, WEDNESDAY, AND FRIDAY., Disp: 28 g, Rfl: 1   ketoconazole (NIZORAL) 2 % cream, APPLY TO THE GROIN AT BEDTIME, Disp: 60 g, Rfl: 1   levothyroxine (SYNTHROID) 112 MCG tablet, Take 112 mcg by mouth daily., Disp: , Rfl:    metoprolol succinate (TOPROL-XL) 25 MG 24 hr tablet, Take 0.5 tablets (12.5 mg total) by mouth daily., Disp: 15 tablet, Rfl: 11   nitroGLYCERIN (NITROSTAT) 0.4 MG SL tablet, Place under the tongue., Disp: , Rfl:    ramipril (ALTACE) 2.5 MG capsule, Take 2.5 mg by mouth daily., Disp: , Rfl:    rosuvastatin (CRESTOR) 20 MG tablet, Take by mouth., Disp: , Rfl:    rosuvastatin (CRESTOR) 40 MG tablet, Take by mouth., Disp: ,  Rfl:    terbinafine (LAMISIL) 250 MG tablet, Take 1 tablet (250 mg total) by mouth daily. (Patient not taking: Reported on 05/27/2022), Disp: 30 tablet, Rfl: 0   traMADol (ULTRAM) 50 MG tablet, , Disp: , Rfl:   Past Medical History: Past Medical History:  Diagnosis Date   Diabetes mellitus without complication (HCC)    GERD (gastroesophageal reflux disease)    Hyperlipidemia    Hypertension    Left shoulder pain    Myocardial infarction ([place])     Tobacco Use: Social History   Tobacco Use  Smoking Status Never  Smokeless Tobacco Never    Labs: Review Flowsheet        No data to display           Exercise Target Goals: Exercise Program Goal: Individual exercise prescription set using results from initial 6 min walk test and THRR while considering  patient's activity barriers and safety.   Exercise Prescription Goal: Initial exercise prescription builds to 30-45 minutes a day of aerobic activity, 2-3 days per week.  Home exercise guidelines will be given to patient during program as part of exercise prescription that the participant will acknowledge.   Education: Aerobic Exercise: - Group verbal and visual presentation on the components of exercise prescription. Introduces F.I.T.T principle from ACSM for exercise prescriptions.  Reviews F.I.T.T. principles of aerobic exercise including progression.  Written material given at graduation. Flowsheet Row Cardiac Rehab from 07/29/2022 in [facility] Cardiac and Pulmonary Rehab  Education need identified 06/04/22  Date 07/08/22  Educator [facility]  Instruction Review Code 1- Verbalizes Understanding       Education: Resistance Exercise: - Group verbal and visual presentation on the components of exercise prescription. Introduces F.I.T.T principle from ACSM for exercise prescriptions  Reviews F.I.T.T. principles of resistance exercise including progression. Written material given at graduation. Flowsheet Row Cardiac Rehab from 07/29/2022  in [facility] Cardiac and Pulmonary Rehab  Date 07/15/22  Educator NT  Instruction Review Code 1- Verbalizes Understanding        Education: Exercise & Equipment Safety: - Individual verbal instruction and demonstration of equipment use and safety with use of the equipment. Flowsheet Row Cardiac Rehab from 07/29/2022 in [facility] Cardiac and Pulmonary Rehab  Education need identified 06/04/22  Date 06/04/22  Educator [place]  Instruction Review Code 1- Verbalizes Understanding       Education: Exercise Physiology & General Exercise Guidelines: - Group verbal and written instruction with models to review the exercise physiology of the cardiovascular system and associated critical values. Provides general exercise guidelines with specific guidelines to those with heart or lung disease.  Flowsheet Row Cardiac Rehab from 07/29/2022 in [facility] Cardiac and Pulmonary Rehab  Education need identified 06/04/22  Date 07/01/22  Educator [facility]  Instruction Review Code 1- [employer] Understanding       Education: Flexibility, Balance, Mind/Body Relaxation: - Group verbal and visual presentation with interactive activity on the components of exercise prescription. Introduces F.I.T.T principle from ACSM for exercise prescriptions. Reviews F.I.T.T. principles of flexibility and balance exercise training including progression. Also discusses the mind body connection.  Reviews various relaxation techniques to help reduce and manage stress (i.e. Deep breathing, progressive muscle relaxation, and visualization). Balance handout provided to take home. Written material given at graduation. Flowsheet Row Cardiac Rehab from 07/29/2022 in [facility] Cardiac and Pulmonary Rehab  Date 07/15/22  Educator NT  Instruction Review Code 1- Verbalizes Understanding       Activity Barriers & Risk Stratification:  Activity Barriers & Cardiac Risk Stratification - 06/04/22 1206       Activity Barriers & Cardiac Risk Stratification    Activity Barriers Deconditioning;Muscular Weakness    Cardiac Risk Stratification Moderate             6 Minute Walk:  6 Minute Walk     Row Name 06/04/22 1206         6 Minute Walk   Phase Initial     Distance 1210 feet     Walk Time 6 minutes     # of Rest Breaks 0     MPH 2.29     METS 2.05     RPE 8     Perceived Dyspnea  1     VO2 Peak 7.19     Symptoms No     Resting HR 55 bpm     Resting BP 118/72     Resting Oxygen Saturation  94 %     Exercise Oxygen Saturation  during 6 min walk 98 %     Max Ex. HR 76 bpm     Max Ex. BP 136/64     2 Minute Post BP 126/66              Oxygen Initial Assessment:   Oxygen Re-Evaluation:   Oxygen Discharge (Final Oxygen Re-Evaluation):   Initial Exercise Prescription:  Initial Exercise Prescription -  06/04/22 1300       Date of Initial Exercise RX and Referring Provider   Date 06/04/22    Referring Provider Donnelly Angelica MD      Oxygen   Maintain Oxygen Saturation 88% or higher      Treadmill   MPH 2.3    Grade 0.5    Minutes 15    METs 2.92      Recumbant Bike   Level 1    RPM 60    Watts 20    Minutes 15    METs 2      NuStep   Level 2    SPM 80    Minutes 15    METs 2      REL-XR   Level 1    Speed 50    Minutes 15    METs 2      T5 Nustep   Level 1    SPM 80    Minutes 15    METs 2      Prescription Details   Frequency (times per week) 3    Duration Progress to 30 minutes of continuous aerobic without signs/symptoms of physical distress      Intensity   THRR 40-80% of Max Heartrate 92 - 129    Ratings of Perceived Exertion 11-13    Perceived Dyspnea 0-4      Progression   Progression Continue to progress workloads to maintain intensity without signs/symptoms of physical distress.      Resistance Training   Training Prescription Yes    Weight 4 lb    Reps 10-15             Perform Capillary Blood Glucose checks as needed.  Exercise Prescription Changes:    Exercise Prescription Changes     Row Name 06/04/22 1300 06/16/22 1500 06/19/22 1100 06/29/22 1100 07/15/22 1600     Response to Exercise   Blood Pressure (Admit) 118/72 144/84 -- 122/60 118/64   Blood Pressure (Exercise) 136/64 128/76 -- 134/62 128/64   Blood Pressure (Exit) 126/66 108/62 -- 122/62 126/62   Heart Rate (Admit) 55 bpm 64 bpm -- 54 bpm 52 bpm   Heart Rate (Exercise) 76 bpm 103 bpm -- 100 bpm 93 bpm   Heart Rate (Exit) 56 bpm 63 bpm -- 77 bpm 61 bpm   Oxygen Saturation (Admit) 94 % -- -- -- --   Oxygen Saturation (Exercise) 98 % -- -- -- --   Oxygen Saturation (Exit) 98 % -- -- -- --   Rating of Perceived Exertion (Exercise) 8 13 -- 12 12   Perceived Dyspnea (Exercise) 1 -- -- -- --   Symptoms none none -- none none   Comments walk test results 2nd full day of exercise -- -- --   Duration -- Progress to 30 minutes of  aerobic without signs/symptoms of physical distress -- Continue with 30 min of aerobic exercise without signs/symptoms of physical distress. Continue with 30 min of aerobic exercise without signs/symptoms of physical distress.   Intensity -- THRR unchanged -- THRR unchanged THRR unchanged     Progression   Progression -- Continue to progress workloads to maintain intensity without signs/symptoms of physical distress. -- Continue to progress workloads to maintain intensity without signs/symptoms of physical distress. Continue to progress workloads to maintain intensity without signs/symptoms of physical distress.   Average METs -- 2.36 -- 2.81 2.98     Resistance Training   Training Prescription -- Yes -- Yes  Yes   Weight -- 4 lb -- 4 lb 4 lb   Reps -- 10-15 -- 10-15 10-15     Interval Training   Interval Training -- No -- No No     Treadmill   MPH -- 2 -- 2.3 2.3   Grade -- 0.5 -- 0.5 0.5   Minutes -- 15 -- 15 15   METs -- 2.67 -- 2.92 2.92     Recumbant Bike   Level -- 1 -- 9 11   Watts -- -- -- 36 61   Minutes -- 15 -- 15 15   METs -- -- --  2.98 3.57     NuStep   Level -- -- -- 4 --   Minutes -- -- -- 15 --   METs -- -- -- 2.9 --     REL-XR   Level -- 1 -- 3 6   Minutes -- 15 -- 15 15     T5 Nustep   Level -- -- -- 4 5   Minutes -- -- -- 15 15   METs -- -- -- 2.3 2.5     Home Exercise Plan   Plans to continue exercise at -- -- Home (comment)  walking, elliptical Home (comment)  walking, elliptical Home (comment)  walking, elliptical   Frequency -- -- Add 2 additional days to program exercise sessions. Add 2 additional days to program exercise sessions. Add 2 additional days to program exercise sessions.   Initial Home Exercises Provided -- -- 06/19/22 06/19/22 06/19/22     Oxygen   Maintain Oxygen Saturation -- 88% or higher -- 88% or higher 88% or higher    Row Name 07/27/22 1300             Response to Exercise   Blood Pressure (Admit) 122/70       Blood Pressure (Exit) 122/64       Heart Rate (Admit) 53 bpm       Heart Rate (Exercise) 93 bpm       Heart Rate (Exit) 85 bpm       Oxygen Saturation (Admit) 96 %       Oxygen Saturation (Exercise) 95 %       Oxygen Saturation (Exit) 94 %       Rating of Perceived Exertion (Exercise) 13       Symptoms none       Duration Continue with 30 min of aerobic exercise without signs/symptoms of physical distress.       Intensity THRR unchanged         Progression   Progression Continue to progress workloads to maintain intensity without signs/symptoms of physical distress.       Average METs 3.49         Resistance Training   Training Prescription Yes       Weight 6 lb       Reps 10-15         Interval Training   Interval Training No         Treadmill   MPH 2.7       Grade 0.5       Minutes 15       METs 3.25         Recumbant Bike   Level 13       Watts 75       Minutes 15       METs 3.93         REL-XR  Level 7       Minutes 15       METs 3.8         T5 Nustep   Level 7       Minutes 15       METs 3         Home Exercise Plan    Plans to continue exercise at Home (comment)  walking, elliptical       Frequency Add 2 additional days to program exercise sessions.       Initial Home Exercises Provided 06/19/22         Oxygen   Maintain Oxygen Saturation 88% or higher                Exercise Comments:   Exercise Goals and Review:   Exercise Goals     Row Name 06/04/22 1339             Exercise Goals   Increase Physical Activity Yes       Intervention Provide advice, education, support and counseling about physical activity/exercise needs.;Develop an individualized exercise prescription for aerobic and resistive training based on initial evaluation findings, risk stratification, comorbidities and participant's personal goals.       Expected Outcomes Short Term: Attend rehab on a regular basis to increase amount of physical activity.;Long Term: Add in home exercise to make exercise part of routine and to increase amount of physical activity.;Long Term: Exercising regularly at least 3-5 days a week.       Increase Strength and Stamina Yes       Intervention Provide advice, education, support and counseling about physical activity/exercise needs.;Develop an individualized exercise prescription for aerobic and resistive training based on initial evaluation findings, risk stratification, comorbidities and participant's personal goals.       Expected Outcomes Short Term: Increase workloads from initial exercise prescription for resistance, speed, and METs.;Short Term: Perform resistance training exercises routinely during rehab and add in resistance training at home;Long Term: Improve cardiorespiratory fitness, muscular endurance and strength as measured by increased METs and functional capacity (6MWT)       Able to understand and use rate of perceived exertion (RPE) scale Yes       Intervention Provide education and explanation on how to use RPE scale       Expected Outcomes Short Term: Able to use RPE daily in rehab  to express subjective intensity level;Long Term:  Able to use RPE to guide intensity level when exercising independently       Able to understand and use Dyspnea scale Yes       Intervention Provide education and explanation on how to use Dyspnea scale       Expected Outcomes Short Term: Able to use Dyspnea scale daily in rehab to express subjective sense of shortness of breath during exertion;Long Term: Able to use Dyspnea scale to guide intensity level when exercising independently       Knowledge and understanding of Target Heart Rate Range (THRR) Yes       Intervention Provide education and explanation of THRR including how the numbers were predicted and where they are located for reference       Expected Outcomes Short Term: Able to state/look up THRR;Long Term: Able to use THRR to govern intensity when exercising independently;Short Term: Able to use daily as guideline for intensity in rehab       Able to check pulse independently Yes       Intervention  Review the importance of being able to check your own pulse for safety during independent exercise;Provide education and demonstration on how to check pulse in carotid and radial arteries.       Expected Outcomes Short Term: Able to explain why pulse checking is important during independent exercise;Long Term: Able to check pulse independently and accurately       Understanding of Exercise Prescription Yes       Intervention Provide education, explanation, and written materials on patient's individual exercise prescription       Expected Outcomes Short Term: Able to explain program exercise prescription;Long Term: Able to explain home exercise prescription to exercise independently                Exercise Goals Re-Evaluation :  Exercise Goals Re-Evaluation     Row Name 06/16/22 1557 06/19/22 1119 06/29/22 1140 07/15/22 1614 07/27/22 1106     Exercise Goal Re-Evaluation   Exercise Goals Review Increase Physical Activity;Increase  Strength and Stamina;Understanding of Exercise Prescription Increase Physical Activity;Increase Strength and Stamina;Understanding of Exercise Prescription;Able to understand and use rate of perceived exertion (RPE) scale;Able to understand and use Dyspnea scale;Knowledge and understanding of Target Heart Rate Range (THRR);Able to check pulse independently Increase Physical Activity;Increase Strength and Stamina;Understanding of Exercise Prescription Increase Physical Activity;Increase Strength and Stamina;Understanding of Exercise Prescription Increase Physical Activity;Increase Strength and Stamina;Understanding of Exercise Prescription   Comments Ronalee Belts is off to a good start with rehab. He has finished 2 full sessions thus far and has been able to tolerate his exercise prescription pretty well. He treadmill was a little challenging for him and we hope to see him improve his endurance on there in order to increase his workload up. Will continue to monitor as he progress. Ronalee Belts is doing well in rehab.  He is already using his elliptical at home.  Reviewed home exercise with pt today.  Pt plans to walk and use elliptical at home for exercise.  Reviewed THR, pulse, RPE, sign and symptoms, pulse oximetery and when to call 911 or MD.  Also discussed weather considerations and indoor options.  Pt voiced understanding. Ronalee Belts is doing great in rehab.  He has already increased to level 4 on the T5 Nustep and level 9 on the recumbent bike, working up to 36 watts. His overall METs have increased to 2.8. His RPEs are staying in appropriate range. Will continue to monitor. Ronalee Belts continues to do well in rehab. He recently increased his overall average MET level to 2.98 METs. He also improved to level 6 on the XR and level 5 on the T5. Ronalee Belts has also done well with his workload on the treadmill at a speed of 2.3 mph and an incline of 0.5%. We will continue to monitor his progress in the program. Ronalee Belts reports that he has been  evaluating is workloads weekly and is slowing progressing with intensity levels as he feels able. He reports that he is not limited at home and is able to do what he needs and wants to do with his physical activity levels.   Expected Outcomes Short: Continue working up tolerance on treadmill to reach initial goal Long: Increase overall MET level Short: Continue to add in exercise at home Long: conitnue to improve stamina Short: Increase handweights to 5 lbs Long: Continue to increase overall MET level Short: Continue to increase workload on the treadmill. Long: Continue to increase strength and stamina. Short: continue to attend rehab consistently for exercise and education. Long:continue to increase  strength and stamina and become independent with exercise program.            Discharge Exercise Prescription (Final Exercise Prescription Changes):  Exercise Prescription Changes - 07/27/22 1300       Response to Exercise   Blood Pressure (Admit) 122/70    Blood Pressure (Exit) 122/64    Heart Rate (Admit) 53 bpm    Heart Rate (Exercise) 93 bpm    Heart Rate (Exit) 85 bpm    Oxygen Saturation (Admit) 96 %    Oxygen Saturation (Exercise) 95 %    Oxygen Saturation (Exit) 94 %    Rating of Perceived Exertion (Exercise) 13    Symptoms none    Duration Continue with 30 min of aerobic exercise without signs/symptoms of physical distress.    Intensity THRR unchanged      Progression   Progression Continue to progress workloads to maintain intensity without signs/symptoms of physical distress.    Average METs 3.49      Resistance Training   Training Prescription Yes    Weight 6 lb    Reps 10-15      Interval Training   Interval Training No      Treadmill   MPH 2.7    Grade 0.5    Minutes 15    METs 3.25      Recumbant Bike   Level 13    Watts 75    Minutes 15    METs 3.93      REL-XR   Level 7    Minutes 15    METs 3.8      T5 Nustep   Level 7    Minutes 15    METs 3       Home Exercise Plan   Plans to continue exercise at Home (comment)   walking, elliptical   Frequency Add 2 additional days to program exercise sessions.    Initial Home Exercises Provided 06/19/22      Oxygen   Maintain Oxygen Saturation 88% or higher             Nutrition:  Target Goals: Understanding of nutrition guidelines, daily intake of sodium <1531m, cholesterol <2076m calories 30% from fat and 7% or less from saturated fats, daily to have 5 or more servings of fruits and vegetables.  Education: All About Nutrition: -Group instruction provided by verbal, written material, interactive activities, discussions, models, and posters to present general guidelines for heart healthy nutrition including fat, fiber, MyPlate, the role of sodium in heart healthy nutrition, utilization of the nutrition label, and utilization of this knowledge for meal planning. Follow up email sent as well. Written material given at graduation. [facility]  Date 07/29/22  [facility] Review Code 1- Verbalizes Understanding       Biometrics:  Pre Biometrics - 06/04/22 1206       Pre Biometrics   Height 6' 1.75" (1.873 m)    Weight 264 lb 14.4 oz (120.2 kg)    BMI (Calculated) 34.25    Single Leg Stand 3.5 seconds              Nutrition Therapy Plan and Nutrition Goals:  Nutrition Therapy & Goals - 06/15/22 1205       Nutrition Therapy   RD appointment deferred Yes   Pt reports following diet for his diabetes and does not wish to meet with RD at this time. Will continue  to follow up.     Personal Nutrition Goals   Nutrition Goal Pt reports following diet for his diabetes and does not wish to meet with RD at this time. Will continue to follow up.             Nutrition Assessments:  MEDIFICTS Score Key: ?70 Need to make dietary changes  40-70 Heart Healthy Diet ? 40 Therapeutic Level  Cholesterol Diet  Flowsheet Row Cardiac Rehab from 06/04/2022 in [facility] Cardiac and Pulmonary Rehab  Picture Your Plate Total Score on Admission 65      Picture Your Plate Scores: <95 Unhealthy dietary pattern with much room for improvement. 41-50 Dietary pattern unlikely to meet recommendations for good health and room for improvement. 51-60 More healthful dietary pattern, with some room for improvement.  >60 Healthy dietary pattern, although there may be some specific behaviors that could be improved.    Nutrition Goals Re-Evaluation:  Nutrition Goals Re-Evaluation     Accident Name 06/19/22 1122 07/27/22 1114           Goals   Nutrition Goal Pt reports following diet for his diabetes and does not wish to meet with RD at this time. Will continue to follow up. Pt reports following diet for his diabetes and does not wish to meet with RD at this time. Will continue to follow up.      Comment Ronalee Belts is good about watching his diet. He limits his carbs but still get lots of fruits and vegetables.  He is good about getting lean protein and balancing carbs. Ronalee Belts continues to follow his dietarty plan that he started several years ago when he became diabetic. He reports limiting  his carbs and eats a well balanced diet.      Expected Outcome Continue to manage diet for diabetes and his heart Continue to manage diet for diabetes and his heart               Nutrition Goals Discharge (Final Nutrition Goals Re-Evaluation):  Nutrition Goals Re-Evaluation - 07/27/22 1114       Goals   Nutrition Goal Pt reports following diet for his diabetes and does not wish to meet with RD at this time. Will continue to follow up.    Comment Ronalee Belts continues to follow his dietarty plan that he started several years ago when he became diabetic. He reports limiting  his carbs and eats a well balanced diet.    Expected Outcome Continue to manage diet for diabetes and his heart             Psychosocial: Target  Goals: Acknowledge presence or absence of significant depression and/or stress, maximize coping skills, provide positive support system. Participant is able to verbalize types and ability to use techniques and skills needed for reducing stress and depression.   Education: Stress, Anxiety, and Depression - Group verbal and visual presentation to define topics covered.  Reviews how body is impacted by stress, anxiety, and depression.  Also discusses healthy ways to reduce stress and to treat/manage anxiety and depression.  Written material given at graduation. Flowsheet Row Cardiac Rehab from 07/29/2022 in [facility] Cardiac and Pulmonary Rehab  Date 06/24/22  Educator [facility]  Instruction Review Code 1- [employer] Understanding       Education: Sleep Hygiene -Provides group verbal and written instruction about how sleep can affect your health.  Define sleep hygiene, discuss sleep cycles and impact of sleep habits. Review good sleep hygiene tips.    Initial  Review & Psychosocial Screening:  Initial Psych Review & Screening - 05/27/22 1312       Initial Review   Current issues with None Identified      Family Dynamics   Good Support System? Yes   wife     Barriers   Psychosocial barriers to participate in program There are no identifiable barriers or psychosocial needs.      Screening Interventions   Interventions Encouraged to exercise;To provide support and resources with identified psychosocial needs;Provide feedback about the scores to participant    Expected Outcomes Short Term goal: Utilizing psychosocial counselor, staff and physician to assist with identification of specific Stressors or current issues interfering with healing process. Setting desired goal for each stressor or current issue identified.;Long Term Goal: Stressors or current issues are controlled or eliminated.;Short Term goal: Identification and review with participant of any Quality of Life or Depression concerns found by  scoring the questionnaire.;Long Term goal: The participant improves quality of Life and PHQ9 Scores as seen by post scores and/or verbalization of changes             Quality of Life Scores:   Quality of Life - 06/04/22 1136       Quality of Life   Select Quality of Life      Quality of Life Scores   Health/Function Pre 16.13 %    Socioeconomic Pre 21.26 %    Psych/Spiritual Pre 21.71 %    Family Pre 25.4 %    GLOBAL Pre 19.83 %            Scores of 19 and below usually indicate a poorer quality of life in these areas.  A difference of  2-3 points is a clinically meaningful difference.  A difference of 2-3 points in the total score of the Quality of Life Index has been associated with significant improvement in overall quality of life, self-image, physical symptoms, and general health in studies assessing change in quality of life.  PHQ-9: Review Flowsheet       06/04/2022 05/04/2017  Depression screen PHQ 2/9  Decreased Interest 0 0  Down, Depressed, Hopeless 0 0  PHQ - 2 Score 0 0  Altered sleeping 1 -  Tired, decreased energy 1 -  Change in appetite 0 -  Feeling bad or failure about yourself  1 -  Trouble concentrating 0 -  Moving slowly or fidgety/restless 0 -  Suicidal thoughts 0 -  PHQ-9 Score 3 -  Difficult doing work/chores Somewhat difficult -   Interpretation of Total Score  Total Score Depression Severity:  1-4 = Minimal depression, 5-9 = Mild depression, 10-14 = Moderate depression, 15-19 = Moderately severe depression, 20-27 = Severe depression   Psychosocial Evaluation and Intervention:  Psychosocial Evaluation - 05/27/22 1324       Psychosocial Evaluation & Interventions   Interventions Encouraged to exercise with the program and follow exercise prescription    Comments Ronalee Belts ahs no barriers to attending the program. He lives with his wife and she is his support. They have been married over 16 years. He wants to learn about taking care of himself  with heart disease. He does have diabetes and has attended the Diabetes Education in the past.  He is consciuosly working on weight loss. He has about 15 lbs more he would like to lose. He is ready to get started    Expected Outcomes STG: mike attends all scheduled sessions. He gains knowledge to work on caring for  himself as he lives with heart disease. LTG Ronalee Belts continues with exercsie progression and caring for himself in a heart healthy manor.    Continue Psychosocial Services  Follow up required by staff             Psychosocial Re-Evaluation:  Psychosocial Re-Evaluation     [place] Name 06/19/22 1120 07/27/22 1116           Psychosocial Re-Evaluation   Current issues with Current Stress Concerns Current Stress Concerns      Comments Ronalee Belts is doing well in rehab.  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  His two dogs are getting near the end of their life and are in heart failure.  He is coping with their inevitable loss.  He sleeps good for most part and has rare nights of not sleeping as well. Make reports no new stress or sleep concerns. He feels like he can manage his work stress. He reports sleeping well and getting good rest most nights.      Expected Outcomes Short: conitnue to cope with work Long: Exercise for mental boost Short: conitnue to cope with work Long: Exercise for mental boost      [place]  -- Follow up required by staff               Psychosocial Discharge (Final Psychosocial Re-Evaluation):  Psychosocial Re-Evaluation - 07/27/22 1116       Psychosocial Re-Evaluation   Current issues with Current Stress Concerns    Comments Make reports no new stress or sleep concerns. He feels like he can manage his work stress. He reports sleeping well and getting good rest most nights.    Expected Outcomes Short: conitnue to cope with work Long: Exercise for mental boost    Continue Psychosocial Services   Follow up required by staff             Vocational Rehabilitation: Provide vocational rehab assistance to qualifying candidates.   Vocational Rehab Evaluation & Intervention:  Vocational Rehab - 05/27/22 1328       Initial Vocational Rehab Evaluation & Intervention   Assessment shows need for Vocational Rehabilitation No      Vocational Rehab Re-Evaulation   Comments Retierd      Discharge Vocational Rehab   Discharge Vocational Rehabilitation RETIRED             Education: Education Goals: Education classes will be provided on a variety of topics geared toward better understanding of heart health and risk factor modification. Participant will state understanding/return demonstration of topics presented as noted by education test scores.  Learning Barriers/Preferences:   General Cardiac Education Topics:  AED/CPR: - Group verbal and written instruction with the use of models to demonstrate the basic use of the AED with the basic ABC's of resuscitation.   Anatomy and Cardiac Procedures: - Group verbal and visual presentation and models provide information about basic cardiac anatomy and function. Reviews the testing methods done to diagnose heart disease and the outcomes of the test results. Describes the treatment choices: Medical Management, Angioplasty, or Coronary Bypass Surgery for treating various heart conditions including Myocardial Infarction, Angina, Valve Disease, and Cardiac Arrhythmias.  Written material given at graduation. Flowsheet Row Cardiac Rehab from 07/29/2022 in [facility] Cardiac and Pulmonary Rehab  Education need identified 06/04/22  Date 07/22/22  Educator SB  Instruction Review Code 1- Verbalizes Understanding       Medication Safety: -  Group verbal and visual instruction to review commonly prescribed medications for heart and lung disease. Reviews the medication, class of the drug, and side effects. Includes the steps to properly store meds  and maintain the prescription regimen.  Written material given at graduation.   Intimacy: - Group verbal instruction through game format to discuss how heart and lung disease can affect sexual intimacy. Written material given at graduation.. Flowsheet Row Cardiac Rehab from 07/29/2022 in [facility] Cardiac and Pulmonary Rehab  Date 07/08/22  Educator [facility]  Instruction Review Code 1- Verbalizes Understanding       Know Your Numbers and Heart Failure: - Group verbal and visual instruction to discuss disease risk factors for cardiac and pulmonary disease and treatment options.  Reviews associated critical values for Overweight/Obesity, Hypertension, Cholesterol, and Diabetes.  Discusses basics of heart failure: signs/symptoms and treatments.  Introduces Heart Failure Zone chart for action plan for heart failure.  Written material given at graduation. Flowsheet Row Cardiac Rehab from 07/29/2022 in [facility] Cardiac and Pulmonary Rehab  Date 06/10/22  Educator SB  Instruction Review Code 1- Verbalizes Understanding       Infection Prevention: - Provides verbal and written material to individual with discussion of infection control including proper hand washing and proper equipment cleaning during exercise session. Flowsheet Row Cardiac Rehab from 07/29/2022 in [facility] Cardiac and Pulmonary Rehab  Education need identified 06/04/22  Date 06/04/22  Educator [place]  Instruction Review Code 1- Verbalizes Understanding       Falls Prevention: - Provides verbal and written material to individual with discussion of falls prevention and safety. Flowsheet Row Cardiac Rehab from 05/27/2022 in [facility] Cardiac and Pulmonary Rehab  Date 05/27/22  Educator SB  Instruction Review Code 1- Verbalizes Understanding       Other: -Provides group and verbal instruction on various topics (see comments)   Knowledge Questionnaire Score:  Knowledge Questionnaire Score - 06/04/22 1136       Knowledge Questionnaire Score    Pre Score 24/26             Core Components/Risk Factors/Patient Goals at Admission:  Personal Goals and Risk Factors at Admission - 06/04/22 1340       Core Components/Risk Factors/Patient Goals on Admission    Weight Management Yes;Weight Loss;Obesity    Intervention Weight Management: Develop a combined nutrition and exercise program designed to reach desired caloric intake, while maintaining appropriate intake of nutrient and fiber, sodium and fats, and appropriate energy expenditure required for the weight goal.;Weight Management: Provide education and appropriate resources to help participant work on and attain dietary goals.;Weight Management/Obesity: Establish reasonable short term and long term weight goals.    Admit Weight 264 lb (119.7 kg)    Goal Weight: Short Term 259 lb (117.5 kg)    Goal Weight: Long Term 250 lb (113.4 kg)    Expected Outcomes Short Term: Continue to assess and modify interventions until short term weight is achieved;Long Term: Adherence to nutrition and physical activity/exercise program aimed toward attainment of established weight goal;Weight Loss: Understanding of general recommendations for a balanced deficit meal plan, which promotes 1-2 lb weight loss per week and includes a negative energy balance of [phone] kcal/d;Understanding recommendations for meals to include 15-35% energy as protein, 25-35% energy from fat, 35-60% energy from carbohydrates, less than 285m of dietary cholesterol, 20-35 gm of total fiber daily;Understanding of distribution of calorie intake throughout the day with the consumption of 4-5 meals/snacks    Diabetes Yes    Intervention  Provide education about signs/symptoms and action to take for hypo/hyperglycemia.;Provide education about proper nutrition, including hydration, and aerobic/resistive exercise prescription along with prescribed medications to achieve blood glucose in normal ranges: Fasting glucose 65-99 mg/dL     Expected Outcomes Short Term: Participant verbalizes understanding of the signs/symptoms and immediate care of hyper/hypoglycemia, proper foot care and importance of medication, aerobic/resistive exercise and nutrition plan for blood glucose control.;Long Term: Attainment of HbA1C < 7%.    Hypertension Yes    Intervention Provide education on lifestyle modifcations including regular physical activity/exercise, weight management, moderate sodium restriction and increased consumption of fresh fruit, vegetables, and low fat dairy, alcohol moderation, and smoking cessation.;Monitor prescription use compliance.    Expected Outcomes Short Term: Continued assessment and intervention until BP is < 140/60m HG in hypertensive participants. < 130/852mHG in hypertensive participants with diabetes, heart failure or chronic kidney disease.;Long Term: Maintenance of blood pressure at goal levels.    Lipids Yes    Intervention Provide education and support for participant on nutrition & aerobic/resistive exercise along with prescribed medications to achieve LDL <705mHDL >13m60m  Expected Outcomes Short Term: Participant states understanding of desired cholesterol values and is compliant with medications prescribed. Participant is following exercise prescription and nutrition guidelines.;Long Term: Cholesterol controlled with medications as prescribed, with individualized exercise RX and with personalized nutrition plan. Value goals: LDL < 70mg44mL > 40 mg.             Education:Diabetes - Individual verbal and written instruction to review signs/symptoms of diabetes, desired ranges of glucose level fasting, after meals and with exercise. Acknowledge that pre and post exercise glucose checks will be done for 3 sessions at entry of program. [place] [facility]  Education need identified 06/04/22  Date 06/04/22  Educator [place]  [place] Review Code 1-  Verbalizes Understanding       Core Components/Risk Factors/Patient Goals Review:   Goals and Risk Factor Review     Row Name 06/19/22 1122 07/27/22 1109           Core Components/Risk Factors/Patient Goals Review   Personal Goals Review Weight Management/Obesity;Hypertension;Diabetes;Lipids Weight Management/Obesity;Hypertension;Diabetes;Lipids      Review Mike Ronalee Beltsoing well in rehab.  His weight is staying steady.  His pressures have been good here in class.  He does check them at home but his machine at home does not like his PVCs.  His sugars are doing well.  He is hoping to get his A1c down under 6 when he checks again in October. Mike Ronalee Beltsrts that he takes all medications as prescribed. He does have a blood pressure cuff and and blood glucose meter at home but does not regularly check them. He states he can usually feel when blood pressure and blood sugar are not withing acceptable ranges. He was encouraged to check these at home and has the equipment to do so. His weight has been steady but he continues to attend rehab consistently and work towards his weight loss goal.      Expected Outcomes Short: Continue to work on weight loss Long: Continue to monitor risk factors Short: Continue to work on weight loss and monitor BP and BG at home.  Long: Continue to monitor risk factors               Core Components/Risk Factors/Patient Goals at Discharge (Final Review):   Goals and Risk Factor Review - 07/27/22 1109  Core Components/Risk Factors/Patient Goals Review   Personal Goals Review Weight Management/Obesity;Hypertension;Diabetes;Lipids    Review Ronalee Belts reports that he takes all medications as prescribed. He does have a blood pressure cuff and and blood glucose meter at home but does not regularly check them. He states he can usually feel when blood pressure and blood sugar are not withing acceptable ranges. He was encouraged to check these at home and has the equipment to do  so. His weight has been steady but he continues to attend rehab consistently and work towards his weight loss goal.    Expected Outcomes Short: Continue to work on weight loss and monitor BP and BG at home.  Long: Continue to monitor risk factors             ITP Comments:  ITP Comments     Row Name 05/27/22 1329 06/04/22 1134 06/04/22 1341 07/08/22 1406 08/05/22 0806   ITP Comments Virtual orientation call completed today. he has an appointment on Date: 06/04/2022  for EP eval and gym Orientation.  Documentation of diagnosis can be found in [facility] Date: 05/13/2022 . Completed 6MWT and gym orientation. Initial ITP created and sent for review to Dr. Ramonita Lab. EKG Strips sent for review. 30 Day review completed. Medical Director ITP review done, changes made as directed, and signed approval by Medical Director. 30 Day review completed. Medical Director ITP review done, changes made as directed, and signed approval by Medical Director.            Comments:

## 2022-08-07 ENCOUNTER — Encounter: Payer: Medicare Other | Admitting: *Deleted

## 2022-08-07 VITALS — Ht 73.75 in | Wt 269.1 lb

## 2022-08-07 DIAGNOSIS — Z955 Presence of coronary angioplasty implant and graft: Secondary | ICD-10-CM | POA: Diagnosis not present

## 2022-08-07 NOTE — Progress Notes (Signed)
Daily Session Note  Patient Details  Name: Nicholas Abbott MRN: [id number] Date of Birth: 10/18/1951 Referring Provider:   Flowsheet Row Cardiac Rehab from 06/04/2022 in [facility] Cardiac and Pulmonary Rehab  Referring Provider Orgel, Ryan MD       Encounter Date: 08/07/2022  Check In:  Session Check In - 08/07/22 1139       Check-In   Supervising physician immediately available to respond to emergencies See telemetry face sheet for immediately available ER MD    Location [facility]-Cardiac & Pulmonary Rehab    Staff Present Susanne Bice, RN, BSN, CCRP;Jessica Hawkins, MA, RCEP, CCRP, CCET;Joseph Hood, RCP,RRT,BSRT    Virtual Visit No    Medication changes reported     No    Fall or balance concerns reported    No    Warm-up and Cool-down Performed on first and last piece of equipment    Resistance Training Performed Yes    VAD Patient? No    PAD/SET Patient? No      Pain Assessment   Currently in Pain? No/denies              6 Minute Walk     Row Name 06/04/22 1206 08/07/22 1134       6 Minute Walk   Phase Initial Discharge    Distance 1210 feet 1600 feet    Distance % Change -- 32.2 %    Distance Feet Change -- 390 ft    Walk Time 6 minutes 6 minutes    # of Rest Breaks 0 0    MPH 2.29 3.03    METS 2.05 2.87    RPE 8 11    Perceived Dyspnea  1 1    VO2 Peak 7.19 10.04    Symptoms No Yes (comment)    Comments -- SOB    Resting HR 55 bpm 66 bpm    Resting BP 118/72 126/64    Resting Oxygen Saturation  94 % 94 %    Exercise Oxygen Saturation  during 6 min walk 98 % 96 %    Max Ex. HR 76 bpm 99 bpm    Max Ex. BP 136/64 128/74    2 Minute Post BP 126/66 --                Social History   Tobacco Use  Smoking Status Never  Smokeless Tobacco Never    Goals Met:  Independence with exercise equipment Exercise tolerated well No report of concerns or symptoms today  Goals Unmet:  Not Applicable  Comments: Pt able to follow exercise prescription today  without complaint.  Will continue to monitor for progression.    Dr. Mark Miller is Medical Director for [facility].  Dr. Fuad Aleskerov is Medical Director for [facility]. 

## 2022-08-07 NOTE — Patient Instructions (Addendum)
Discharge Patient Instructions  Patient Details  Name: Nicholas Abbott MRN: [id number] Date of Birth: [birth year] Referring Provider:  Gladstone Lighter, MD   Number of Visits: 59  Reason for Discharge:  Patient reached a stable level of exercise. Patient independent in their exercise. Patient has met program and personal goals.  Smoking History:  Social History   Tobacco Use  Smoking Status Never  Smokeless Tobacco Never    Diagnosis:  No diagnosis found.  Initial Exercise Prescription:  Initial Exercise Prescription - 06/04/22 1300       Date of Initial Exercise RX and Referring Provider   Date 06/04/22    Referring Provider Donnelly Angelica MD      Oxygen   Maintain Oxygen Saturation 88% or higher      Treadmill   MPH 2.3    Grade 0.5    Minutes 15    METs 2.92      Recumbant Bike   Level 1    RPM 60    Watts 20    Minutes 15    METs 2      NuStep   Level 2    SPM 80    Minutes 15    METs 2      REL-XR   Level 1    Speed 50    Minutes 15    METs 2      T5 Nustep   Level 1    SPM 80    Minutes 15    METs 2      Prescription Details   Frequency (times per week) 3    Duration Progress to 30 minutes of continuous aerobic without signs/symptoms of physical distress      Intensity   THRR 40-80% of Max Heartrate 92 - 129    Ratings of Perceived Exertion 11-13    Perceived Dyspnea 0-4      Progression   Progression Continue to progress workloads to maintain intensity without signs/symptoms of physical distress.      Resistance Training   Training Prescription Yes    Weight 4 lb    Reps 10-15             Discharge Exercise Prescription (Final Exercise Prescription Changes):  Exercise Prescription Changes - 07/27/22 1300       Response to Exercise   Blood Pressure (Admit) 122/70    Blood Pressure (Exit) 122/64    Heart Rate (Admit) 53 bpm    Heart Rate (Exercise) 93 bpm    Heart Rate (Exit) 85 bpm    Oxygen Saturation (Admit) 96  %    Oxygen Saturation (Exercise) 95 %    Oxygen Saturation (Exit) 94 %    Rating of Perceived Exertion (Exercise) 13    Symptoms none    Duration Continue with 30 min of aerobic exercise without signs/symptoms of physical distress.    Intensity THRR unchanged      Progression   Progression Continue to progress workloads to maintain intensity without signs/symptoms of physical distress.    Average METs 3.49      Resistance Training   Training Prescription Yes    Weight 6 lb    Reps 10-15      Interval Training   Interval Training No      Treadmill   MPH 2.7    Grade 0.5    Minutes 15    METs 3.25      Recumbant Bike   Level 13  Watts 75    Minutes 15    METs 3.93      REL-XR   Level 7    Minutes 15    METs 3.8      T5 Nustep   Level 7    Minutes 15    METs 3      Home Exercise Plan   Plans to continue exercise at Home (comment)   walking, elliptical   Frequency Add 2 additional days to program exercise sessions.    Initial Home Exercises Provided 06/19/22      Oxygen   Maintain Oxygen Saturation 88% or higher             Functional Capacity:  6 Minute Walk     Row Name 06/04/22 1206 08/07/22 1134       6 Minute Walk   Phase Initial Discharge    Distance 1210 feet 1600 feet    Distance % Change -- 32.2 %    Distance Feet Change -- 390 ft    Walk Time 6 minutes 6 minutes    # of Rest Breaks 0 0    MPH 2.29 3.03    METS 2.05 2.87    RPE 8 11    Perceived Dyspnea  1 1    VO2 Peak 7.19 10.04    Symptoms No Yes (comment)    Comments -- SOB    Resting HR 55 bpm 66 bpm    Resting BP 118/72 126/64    Resting Oxygen Saturation  94 % 94 %    Exercise Oxygen Saturation  during 6 min walk 98 % 96 %    Max Ex. HR 76 bpm 99 bpm    Max Ex. BP 136/64 128/74    2 Minute Post BP 126/66 --               Nutrition & Weight - Outcomes:  Pre Biometrics - 06/04/22 1206       Pre Biometrics   Height 6' 1.75" (1.873 m)    Weight 264 lb 14.4 oz  (120.2 kg)    BMI (Calculated) 34.25    Single Leg Stand 3.5 seconds             Post Biometrics - 08/07/22 1134        Post  Biometrics   Height 6' 1.75" (1.873 m)    Weight 269 lb 1.6 oz (122.1 kg)    BMI (Calculated) 34.79    Single Leg Stand 1.9 seconds            Goals reviewed with patient; copy given to patient.

## 2022-08-10 ENCOUNTER — Encounter: Payer: Medicare Other | Admitting: *Deleted

## 2022-08-10 DIAGNOSIS — Z955 Presence of coronary angioplasty implant and graft: Secondary | ICD-10-CM | POA: Diagnosis not present

## 2022-08-10 NOTE — Progress Notes (Signed)
Daily Session Note  Patient Details  Name: Nicholas Abbott MRN: [id number] Date of Birth: [birth year] Referring Provider:   Flowsheet Row Cardiac Rehab [facility] Card[facility]  Referring Provider Donnelly Angelica MD       Encounter Date: 08/10/2022  Check In:  Session Check In - 08/10/22 1155       Check-In   Supervising physician immediately available to respond to emergencies See telemetry face sheet for immediately available ER MD    Location [facility]-Cardiac & Pulmonary Rehab    Staff Present Alberteen Sam, MA, RCEP, CCRP, CCET;Joseph Pike, Ernestina Patches, RN, [place]    Virtual Visit No    Medication changes reported     No    Fall or balance concerns reported    No    Warm-up and Cool-down Performed on first and last piece of equipment    Resistance Training Performed Yes    VAD Patient? No    PAD/SET Patient? No      Pain Assessment   Currently in Pain? No/denies                Social History   Tobacco Use  Smoking Status Never  Smokeless Tobacco Never    Goals Met:  Independence with exercise equipment Exercise tolerated well No report of concerns or symptoms today Strength training completed today  Goals Unmet:  Not Applicable  Comments: Pt able to follow exercise prescription today without complaint.  Will continue to monitor for progression.    Dr. Emily Filbert is Medical Director for [facility].  Dr. Ottie Glazier is Medical Director for [facility] Pulmonary [facility].

## 2022-08-12 ENCOUNTER — Encounter: Payer: Medicare Other | Admitting: *Deleted

## 2022-08-12 DIAGNOSIS — Z955 Presence of coronary angioplasty implant and graft: Secondary | ICD-10-CM

## 2022-08-12 NOTE — Progress Notes (Signed)
Daily Session Note  Patient Details  Name: Nicholas Abbott MRN: [id number] Date of Birth: 21-Jan-1951 Referring Provider:   Flowsheet Row Cardiac Rehab from 06/04/2022 in [facility] Cardiac and Pulmonary Rehab  Referring Provider Donnelly Angelica MD       Encounter Date: 08/12/2022  Check In:  Session Check In - 08/12/22 1416       Check-In   Supervising physician immediately available to respond to emergencies See telemetry face sheet for immediately available ER MD    Location [facility]-Cardiac & Pulmonary Rehab    Staff Present Alberteen Sam, MA, RCEP, CCRP, CCET;Filiberto Wamble [place], RN Moises Blood, BS, ACSM CEP, Exercise Physiologist    Virtual Visit No    Medication changes reported     No    Fall or balance concerns reported    No    Warm-up and Cool-down Performed on first and last piece of equipment    Resistance Training Performed Yes    VAD Patient? No    PAD/SET Patient? No      Pain Assessment   Currently in Pain? No/denies                Social History   Tobacco Use  Smoking Status Never  Smokeless Tobacco Never    Goals Met:  Independence with exercise equipment Exercise tolerated well No report of concerns or symptoms today Strength training completed today  Goals Unmet:  Not Applicable  Comments: Pt able to follow exercise prescription today without complaint.  Will continue to monitor for progression.    Dr. Emily Filbert is Medical Director for [place].  Dr. Ottie Glazier is Medical Director for [facility] Pulmonary Rehabilitation.

## 2022-08-14 ENCOUNTER — Encounter: Payer: Medicare Other | Admitting: *Deleted

## 2022-08-14 DIAGNOSIS — Z955 Presence of coronary angioplasty implant and graft: Secondary | ICD-10-CM

## 2022-08-14 NOTE — Progress Notes (Signed)
Daily Session Note  Patient Details  Name: Jahvon Gosline MRN: [id number] Date of Birth: 05/11/1951 Referring Provider:   Flowsheet Row Cardiac Rehab from 06/04/2022 in [facility] Cardiac and Pulmonary Rehab  Referring Provider Donnelly Angelica MD       Encounter Date: 08/14/2022  Check In:  Session Check In - 08/14/22 1114       Check-In   Supervising physician immediately available to respond to emergencies See telemetry face sheet for immediately available ER MD    Location [facility]-Cardiac & Pulmonary Rehab    Staff Present Nyoka Cowden, RN, BSN, Fenton Foy, BS, Exercise Physiologist;Susanne Bice, RN, BSN, CCRP    Virtual Visit No    Fall or balance concerns reported    No    Tobacco Cessation No Change    Warm-up and Cool-down Performed on first and last piece of equipment    Resistance Training Performed Yes    VAD Patient? No    PAD/SET Patient? No      Pain Assessment   Currently in Pain? No/denies                Social History   Tobacco Use  Smoking Status Never  Smokeless Tobacco Never    Goals Met:  Independence with exercise equipment Exercise tolerated well No report of concerns or symptoms today  Goals Unmet:  Not Applicable  Comments: Pt able to follow exercise prescription today without complaint.  Will continue to monitor for progression.    Dr. Emily Filbert is Medical Director for [place].  Dr. Ottie Glazier is Medical Director for [facility] Pulmonary Rehabilitation.

## 2022-08-17 ENCOUNTER — Encounter: Payer: Medicare Other | Admitting: *Deleted

## 2022-08-17 DIAGNOSIS — Z955 Presence of coronary angioplasty implant and graft: Secondary | ICD-10-CM | POA: Diagnosis not present

## 2022-08-17 NOTE — Patient Instructions (Signed)
Discharge Patient Instructions  Patient Details  Name: Nicholas Abbott MRN: [id number] Date of Birth: 08/16/1951 Referring Provider:  Gladstone Lighter, MD   Number of Visits: 23  Reason for Discharge:  Patient reached a stable level of exercise. Patient independent in their exercise. Patient has met program and personal goals.  Diagnosis:  Status post coronary artery stent placement  Initial Exercise Prescription:  Initial Exercise Prescription - 06/04/22 1300       Date of Initial Exercise RX and Referring Provider   Date 06/04/22    Referring Provider Donnelly Angelica MD      Oxygen   Maintain Oxygen Saturation 88% or higher      Treadmill   MPH 2.3    Grade 0.5    Minutes 15    METs 2.92      Recumbant Bike   Level 1    RPM 60    Watts 20    Minutes 15    METs 2      NuStep   Level 2    SPM 80    Minutes 15    METs 2      REL-XR   Level 1    Speed 50    Minutes 15    METs 2      T5 Nustep   Level 1    SPM 80    Minutes 15    METs 2      Prescription Details   Frequency (times per week) 3    Duration Progress to 30 minutes of continuous aerobic without signs/symptoms of physical distress      Intensity   THRR 40-80% of Max Heartrate 92 - 129    Ratings of Perceived Exertion 11-13    Perceived Dyspnea 0-4      Progression   Progression Continue to progress workloads to maintain intensity without signs/symptoms of physical distress.      Resistance Training   Training Prescription Yes    Weight 4 lb    Reps 10-15             Discharge Exercise Prescription (Final Exercise Prescription Changes):  Exercise Prescription Changes - 08/11/22 1400       Response to Exercise   Blood Pressure (Admit) 126/64    Blood Pressure (Exit) 120/66    Heart Rate (Admit) 66 bpm    Heart Rate (Exercise) 99 bpm    Heart Rate (Exit) 75 bpm    Rating of Perceived Exertion (Exercise) 13    Symptoms none    Duration Continue with 30 min of aerobic  exercise without signs/symptoms of physical distress.    Intensity THRR unchanged      Progression   Progression Continue to progress workloads to maintain intensity without signs/symptoms of physical distress.    Average METs 3.51      Resistance Training   Training Prescription Yes    Weight 6 lb    Reps 10-15      Interval Training   Interval Training No      Treadmill   MPH 2.6    Grade 0.5    Minutes 15    METs 3.17      NuStep   Level 7    Minutes 15      REL-XR   Level 8    Minutes 15    METs 4.2      T5 Nustep   Level 7    Minutes 15  Home Exercise Plan   Plans to continue exercise at Home (comment)   walking, elliptical   Frequency Add 2 additional days to program exercise sessions.    Initial Home Exercises Provided 06/19/22      Oxygen   Maintain Oxygen Saturation 88% or higher             Functional Capacity:  6 Minute Walk     Row Name 06/04/22 1206 08/07/22 1134       6 Minute Walk   Phase Initial Discharge    Distance 1210 feet 1600 feet    Distance % Change -- 32.2 %    Distance Feet Change -- 390 ft    Walk Time 6 minutes 6 minutes    # of Rest Breaks 0 0    MPH 2.29 3.03    METS 2.05 2.87    RPE 8 11    Perceived Dyspnea  1 1    VO2 Peak 7.19 10.04    Symptoms No Yes (comment)    Comments -- SOB    Resting HR 55 bpm 66 bpm    Resting BP 118/72 126/64    Resting Oxygen Saturation  94 % 94 %    Exercise Oxygen Saturation  during 6 min walk 98 % 96 %    Max Ex. HR 76 bpm 99 bpm    Max Ex. BP 136/64 128/74    2 Minute Post BP 126/66 --            Nutrition & Weight - Outcomes:  Pre Biometrics - 06/04/22 1206       Pre Biometrics   Height 6' 1.75" (1.873 m)    Weight 264 lb 14.4 oz (120.2 kg)    BMI (Calculated) 34.25    Single Leg Stand 3.5 seconds             Post Biometrics - 08/07/22 1134        Post  Biometrics   Height 6' 1.75" (1.873 m)    Weight 269 lb 1.6 oz (122.1 kg)    BMI (Calculated)  34.79    Single Leg Stand 1.9 seconds             Nutrition:  Nutrition Therapy & Goals - 06/15/22 1205       Nutrition Therapy   RD appointment deferred Yes   Pt reports following diet for his diabetes and does not wish to meet with RD at this time. Will continue to follow up.     Personal Nutrition Goals   Nutrition Goal Pt reports following diet for his diabetes and does not wish to meet with RD at this time. Will continue to follow up.

## 2022-08-17 NOTE — Progress Notes (Signed)
Daily Session Note  Patient Details  Name: Nicholas Abbott MRN: [id number] Date of Birth: 10/23/1951 Referring Provider:   Flowsheet Row Cardiac Rehab from 06/04/2022 in [facility] Cardiac and Pulmonary Rehab  Referring Provider Donnelly Angelica MD       Encounter Date: 08/17/2022  Check In:  Session Check In - 08/17/22 1117       Check-In   Supervising physician immediately available to respond to emergencies See telemetry face sheet for immediately available ER MD    Location [facility]-Cardiac & Pulmonary Rehab    Staff Present Earlean Shawl, BS, ACSM CEP, Exercise Physiologist;Noah Tickle, BS, Exercise Physiologist;Michaelia Beilfuss Tamala Julian, RN, ADN    Virtual Visit No    Medication changes reported     No    Fall or balance concerns reported    No    Warm-up and Cool-down Performed on first and last piece of equipment    Resistance Training Performed Yes    VAD Patient? No    PAD/SET Patient? No      Pain Assessment   Currently in Pain? No/denies                Social History   Tobacco Use  Smoking Status Never  Smokeless Tobacco Never    Goals Met:  Independence with exercise equipment Exercise tolerated well No report of concerns or symptoms today Strength training completed today  Goals Unmet:  Not Applicable  Comments: Pt able to follow exercise prescription today without complaint.  Will continue to monitor for progression.    Dr. Emily Filbert is Medical Director for [place].  Dr. Ottie Glazier is Medical Director for [facility] Pulmonary Rehabilitation.

## 2022-08-19 ENCOUNTER — Encounter: Payer: Medicare Other | Admitting: *Deleted

## 2022-08-19 DIAGNOSIS — Z955 Presence of coronary angioplasty implant and graft: Secondary | ICD-10-CM

## 2022-08-19 NOTE — Progress Notes (Signed)
Cardiac Individual Treatment Plan  Patient Details  Name: Trejon Duford MRN: [id number] Date of Birth: 1951-02-24 Referring Provider:   Flowsheet Row Cardiac Rehab from 06/04/2022 in [facility] Cardiac and Pulmonary Rehab  Referring Provider Donnelly Angelica MD       Initial Encounter Date:  Flowsheet Row Cardiac Rehab from 06/04/2022 in [facility] Cardiac and Pulmonary Rehab  Date 06/04/22       Visit Diagnosis: Status post coronary artery stent placement  Patient's Home Medications on Admission:  Current Outpatient Medications:    aspirin EC 81 MG tablet, Take 81 mg by mouth daily., Disp: , Rfl:    azithromycin (ZITHROMAX) 250 MG tablet, Take by mouth., Disp: , Rfl:    Cinnamon 500 MG capsule, Take 1,000 mg by mouth 2 (two) times daily as needed (blood sugar control)., Disp: , Rfl:    clopidogrel (PLAVIX) 75 MG tablet, Take 1 tablet (75 mg total) by mouth daily., Disp: 30 tablet, Rfl: 11   Cyanocobalamin (VITAMIN B-12) 5000 MCG TBDP, Take 5,000 mcg by mouth daily. , Disp: , Rfl:    empagliflozin (JARDIANCE) 10 MG TABS tablet, Take 1 tablet by mouth daily with breakfast., Disp: , Rfl:    folic acid-pyridoxine-cyancobalamin (FOLTX) 2.5-25-2 MG TABS tablet, Take 1 tablet by mouth daily., Disp: , Rfl:    hydrocortisone 2.5 % cream, APPLY TO THE GROIN DAILY ON MONDAY, WEDNESDAY, AND FRIDAY., Disp: 28 g, Rfl: 1   ketoconazole (NIZORAL) 2 % cream, APPLY TO THE GROIN AT BEDTIME, Disp: 60 g, Rfl: 1   levothyroxine (SYNTHROID) 112 MCG tablet, Take 112 mcg by mouth daily., Disp: , Rfl:    metoprolol succinate (TOPROL-XL) 25 MG 24 hr tablet, Take 0.5 tablets (12.5 mg total) by mouth daily., Disp: 15 tablet, Rfl: 11   nitroGLYCERIN (NITROSTAT) 0.4 MG SL tablet, Place under the tongue., Disp: , Rfl:    ramipril (ALTACE) 2.5 MG capsule, Take 2.5 mg by mouth daily., Disp: , Rfl:    rosuvastatin (CRESTOR) 20 MG tablet, Take by mouth., Disp: , Rfl:    rosuvastatin (CRESTOR) 40 MG tablet, Take by mouth., Disp: ,  Rfl:    terbinafine (LAMISIL) 250 MG tablet, Take 1 tablet (250 mg total) by mouth daily. (Patient not taking: Reported on 05/27/2022), Disp: 30 tablet, Rfl: 0   traMADol (ULTRAM) 50 MG tablet, , Disp: , Rfl:   Past Medical History: Past Medical History:  Diagnosis Date   Diabetes mellitus without complication (HCC)    GERD (gastroesophageal reflux disease)    Hyperlipidemia    Hypertension    Left shoulder pain    Myocardial infarction ([place])     Tobacco Use: Social History   Tobacco Use  Smoking Status Never  Smokeless Tobacco Never    Labs: Review Flowsheet        No data to display           Exercise Target Goals: Exercise Program Goal: Individual exercise prescription set using results from initial 6 min walk test and THRR while considering  patient's activity barriers and safety.   Exercise Prescription Goal: Initial exercise prescription builds to 30-45 minutes a day of aerobic activity, 2-3 days per week.  Home exercise guidelines will be given to patient during program as part of exercise prescription that the participant will acknowledge.   Education: Aerobic Exercise: - Group verbal and visual presentation on the components of exercise prescription. Introduces F.I.T.T principle from ACSM for exercise prescriptions.  Reviews F.I.T.T. principles of aerobic exercise including progression.  Written material given at graduation. Flowsheet Row Cardiac Rehab from 08/05/2022 in [facility] Cardiac and Pulmonary Rehab  Education need identified 06/04/22  Date 07/08/22  Educator [facility]  Instruction Review Code 1- Verbalizes Understanding       Education: Resistance Exercise: - Group verbal and visual presentation on the components of exercise prescription. Introduces F.I.T.T principle from ACSM for exercise prescriptions  Reviews F.I.T.T. principles of resistance exercise including progression. Written material given at graduation. Flowsheet Row Cardiac Rehab from 08/05/2022  in [facility] Cardiac and Pulmonary Rehab  Date 07/15/22  Educator NT  Instruction Review Code 1- Verbalizes Understanding        Education: Exercise & Equipment Safety: - Individual verbal instruction and demonstration of equipment use and safety with use of the equipment. Flowsheet Row Cardiac Rehab from 08/05/2022 in [facility] Cardiac and Pulmonary Rehab  Education need identified 06/04/22  Date 06/04/22  Educator [place]  Instruction Review Code 1- Verbalizes Understanding       Education: Exercise Physiology & General Exercise Guidelines: - Group verbal and written instruction with models to review the exercise physiology of the cardiovascular system and associated critical values. Provides general exercise guidelines with specific guidelines to those with heart or lung disease.  Flowsheet Row Cardiac Rehab from 08/05/2022 in [facility] Cardiac and Pulmonary Rehab  Education need identified 06/04/22  Date 07/01/22  Educator [facility]  Instruction Review Code 1- [employer] Understanding       Education: Flexibility, Balance, Mind/Body Relaxation: - Group verbal and visual presentation with interactive activity on the components of exercise prescription. Introduces F.I.T.T principle from ACSM for exercise prescriptions. Reviews F.I.T.T. principles of flexibility and balance exercise training including progression. Also discusses the mind body connection.  Reviews various relaxation techniques to help reduce and manage stress (i.e. Deep breathing, progressive muscle relaxation, and visualization). Balance handout provided to take home. Written material given at graduation. Flowsheet Row Cardiac Rehab from 08/05/2022 in [facility] Cardiac and Pulmonary Rehab  Date 07/15/22  Educator NT  Instruction Review Code 1- Verbalizes Understanding       Activity Barriers & Risk Stratification:  Activity Barriers & Cardiac Risk Stratification - 06/04/22 1206       Activity Barriers & Cardiac Risk Stratification    Activity Barriers Deconditioning;Muscular Weakness    Cardiac Risk Stratification Moderate             6 Minute Walk:  6 Minute Walk     Row Name 06/04/22 1206 08/07/22 1134       6 Minute Walk   Phase Initial Discharge    Distance 1210 feet 1600 feet    Distance % Change -- 32.2 %    Distance Feet Change -- 390 ft    Walk Time 6 minutes 6 minutes    # of Rest Breaks 0 0    MPH 2.29 3.03    METS 2.05 2.87    RPE 8 11    Perceived Dyspnea  1 1    VO2 Peak 7.19 10.04    Symptoms No Yes (comment)    Comments -- SOB    Resting HR 55 bpm 66 bpm    Resting BP 118/72 126/64    Resting Oxygen Saturation  94 % 94 %    Exercise Oxygen Saturation  during 6 min walk 98 % 96 %    Max Ex. HR 76 bpm 99 bpm    Max Ex. BP 136/64 128/74    2 Minute Post BP 126/66 --  Oxygen Initial Assessment:   Oxygen Re-Evaluation:   Oxygen Discharge (Final Oxygen Re-Evaluation):   Initial Exercise Prescription:  Initial Exercise Prescription - 06/04/22 1300       Date of Initial Exercise RX and Referring Provider   Date 06/04/22    Referring Provider Donnelly Angelica MD      Oxygen   Maintain Oxygen Saturation 88% or higher      Treadmill   MPH 2.3    Grade 0.5    Minutes 15    METs 2.92      Recumbant Bike   Level 1    RPM 60    Watts 20    Minutes 15    METs 2      NuStep   Level 2    SPM 80    Minutes 15    METs 2      REL-XR   Level 1    Speed 50    Minutes 15    METs 2      T5 Nustep   Level 1    SPM 80    Minutes 15    METs 2      Prescription Details   Frequency (times per week) 3    Duration Progress to 30 minutes of continuous aerobic without signs/symptoms of physical distress      Intensity   THRR 40-80% of Max Heartrate 92 - 129    Ratings of Perceived Exertion 11-13    Perceived Dyspnea 0-4      Progression   Progression Continue to progress workloads to maintain intensity without signs/symptoms of physical distress.       Resistance Training   Training Prescription Yes    Weight 4 lb    Reps 10-15             Perform Capillary Blood Glucose checks as needed.  Exercise Prescription Changes:   Exercise Prescription Changes     Row Name 06/04/22 1300 06/16/22 1500 06/19/22 1100 06/29/22 1100 07/15/22 1600     Response to Exercise   Blood Pressure (Admit) 118/72 144/84 -- 122/60 118/64   Blood Pressure (Exercise) 136/64 128/76 -- 134/62 128/64   Blood Pressure (Exit) 126/66 108/62 -- 122/62 126/62   Heart Rate (Admit) 55 bpm 64 bpm -- 54 bpm 52 bpm   Heart Rate (Exercise) 76 bpm 103 bpm -- 100 bpm 93 bpm   Heart Rate (Exit) 56 bpm 63 bpm -- 77 bpm 61 bpm   Oxygen Saturation (Admit) 94 % -- -- -- --   Oxygen Saturation (Exercise) 98 % -- -- -- --   Oxygen Saturation (Exit) 98 % -- -- -- --   Rating of Perceived Exertion (Exercise) 8 13 -- 12 12   Perceived Dyspnea (Exercise) 1 -- -- -- --   Symptoms none none -- none none   Comments walk test results 2nd full day of exercise -- -- --   Duration -- Progress to 30 minutes of  aerobic without signs/symptoms of physical distress -- Continue with 30 min of aerobic exercise without signs/symptoms of physical distress. Continue with 30 min of aerobic exercise without signs/symptoms of physical distress.   Intensity -- THRR unchanged -- THRR unchanged THRR unchanged     Progression   Progression -- Continue to progress workloads to maintain intensity without signs/symptoms of physical distress. -- Continue to progress workloads to maintain intensity without signs/symptoms of physical distress. Continue to progress workloads to maintain intensity without signs/symptoms of physical  distress.   Average METs -- 2.36 -- 2.81 2.98     Resistance Training   Training Prescription -- Yes -- Yes Yes   Weight -- 4 lb -- 4 lb 4 lb   Reps -- 10-15 -- 10-15 10-15     Interval Training   Interval Training -- No -- No No     Treadmill   MPH -- 2 -- 2.3 2.3    Grade -- 0.5 -- 0.5 0.5   Minutes -- 15 -- 15 15   METs -- 2.67 -- 2.92 2.92     Recumbant Bike   Level -- 1 -- 9 11   Watts -- -- -- 36 61   Minutes -- 15 -- 15 15   METs -- -- -- 2.98 3.57     NuStep   Level -- -- -- 4 --   Minutes -- -- -- 15 --   METs -- -- -- 2.9 --     REL-XR   Level -- 1 -- 3 6   Minutes -- 15 -- 15 15     T5 Nustep   Level -- -- -- 4 5   Minutes -- -- -- 15 15   METs -- -- -- 2.3 2.5     Home Exercise Plan   Plans to continue exercise at -- -- Home (comment)  walking, elliptical Home (comment)  walking, elliptical Home (comment)  walking, elliptical   Frequency -- -- Add 2 additional days to program exercise sessions. Add 2 additional days to program exercise sessions. Add 2 additional days to program exercise sessions.   Initial Home Exercises Provided -- -- 06/19/22 06/19/22 06/19/22     Oxygen   Maintain Oxygen Saturation -- 88% or higher -- 88% or higher 88% or higher    Row Name 07/27/22 1300 08/11/22 1400           Response to Exercise   Blood Pressure (Admit) 122/70 126/64      Blood Pressure (Exit) 122/64 120/66      Heart Rate (Admit) 53 bpm 66 bpm      Heart Rate (Exercise) 93 bpm 99 bpm      Heart Rate (Exit) 85 bpm 75 bpm      Oxygen Saturation (Admit) 96 % --      Oxygen Saturation (Exercise) 95 % --      Oxygen Saturation (Exit) 94 % --      Rating of Perceived Exertion (Exercise) 13 13      Symptoms none none      Duration Continue with 30 min of aerobic exercise without signs/symptoms of physical distress. Continue with 30 min of aerobic exercise without signs/symptoms of physical distress.      Intensity THRR unchanged THRR unchanged        Progression   Progression Continue to progress workloads to maintain intensity without signs/symptoms of physical distress. Continue to progress workloads to maintain intensity without signs/symptoms of physical distress.      Average METs 3.49 3.51        Resistance Training    Training Prescription Yes Yes      Weight 6 lb 6 lb      Reps 10-15 10-15        Interval Training   Interval Training No No        Treadmill   MPH 2.7 2.6      Grade 0.5 0.5      Minutes 15 15  METs 3.25 3.17        Recumbant Bike   Level 13 --      Watts 75 --      Minutes 15 --      METs 3.93 --        NuStep   Level -- 7      Minutes -- 15        REL-XR   Level 7 8      Minutes 15 15      METs 3.8 4.2        T5 Nustep   Level 7 7      Minutes 15 15      METs 3 --        Home Exercise Plan   Plans to continue exercise at Home (comment)  walking, elliptical Home (comment)  walking, elliptical      Frequency Add 2 additional days to program exercise sessions. Add 2 additional days to program exercise sessions.      Initial Home Exercises Provided 06/19/22 06/19/22        Oxygen   Maintain Oxygen Saturation 88% or higher 88% or higher               Exercise Comments:   Exercise Goals and Review:   Exercise Goals     Row Name 06/04/22 1339             Exercise Goals   Increase Physical Activity Yes       Intervention Provide advice, education, support and counseling about physical activity/exercise needs.;Develop an individualized exercise prescription for aerobic and resistive training based on initial evaluation findings, risk stratification, comorbidities and participant's personal goals.       Expected Outcomes Short Term: Attend rehab on a regular basis to increase amount of physical activity.;Long Term: Add in home exercise to make exercise part of routine and to increase amount of physical activity.;Long Term: Exercising regularly at least 3-5 days a week.       Increase Strength and Stamina Yes       Intervention Provide advice, education, support and counseling about physical activity/exercise needs.;Develop an individualized exercise prescription for aerobic and resistive training based on initial evaluation findings, risk stratification,  comorbidities and participant's personal goals.       Expected Outcomes Short Term: Increase workloads from initial exercise prescription for resistance, speed, and METs.;Short Term: Perform resistance training exercises routinely during rehab and add in resistance training at home;Long Term: Improve cardiorespiratory fitness, muscular endurance and strength as measured by increased METs and functional capacity (6MWT)       Able to understand and use rate of perceived exertion (RPE) scale Yes       Intervention Provide education and explanation on how to use RPE scale       Expected Outcomes Short Term: Able to use RPE daily in rehab to express subjective intensity level;Long Term:  Able to use RPE to guide intensity level when exercising independently       Able to understand and use Dyspnea scale Yes       Intervention Provide education and explanation on how to use Dyspnea scale       Expected Outcomes Short Term: Able to use Dyspnea scale daily in rehab to express subjective sense of shortness of breath during exertion;Long Term: Able to use Dyspnea scale to guide intensity level when exercising independently       Knowledge and understanding of Target Heart Rate Range (  THRR) Yes       Intervention Provide education and explanation of THRR including how the numbers were predicted and where they are located for reference       Expected Outcomes Short Term: Able to state/look up THRR;Long Term: Able to use THRR to govern intensity when exercising independently;Short Term: Able to use daily as guideline for intensity in rehab       Able to check pulse independently Yes       Intervention Review the importance of being able to check your own pulse for safety during independent exercise;Provide education and demonstration on how to check pulse in carotid and radial arteries.       Expected Outcomes Short Term: Able to explain why pulse checking is important during independent exercise;Long Term: Able to  check pulse independently and accurately       Understanding of Exercise Prescription Yes       Intervention Provide education, explanation, and written materials on patient's individual exercise prescription       Expected Outcomes Short Term: Able to explain program exercise prescription;Long Term: Able to explain home exercise prescription to exercise independently                Exercise Goals Re-Evaluation :  Exercise Goals Re-Evaluation     Row Name 06/16/22 1557 06/19/22 1119 06/29/22 1140 07/15/22 1614 07/27/22 1106     Exercise Goal Re-Evaluation   Exercise Goals Review Increase Physical Activity;Increase Strength and Stamina;Understanding of Exercise Prescription Increase Physical Activity;Increase Strength and Stamina;Understanding of Exercise Prescription;Able to understand and use rate of perceived exertion (RPE) scale;Able to understand and use Dyspnea scale;Knowledge and understanding of Target Heart Rate Range (THRR);Able to check pulse independently Increase Physical Activity;Increase Strength and Stamina;Understanding of Exercise Prescription Increase Physical Activity;Increase Strength and Stamina;Understanding of Exercise Prescription Increase Physical Activity;Increase Strength and Stamina;Understanding of Exercise Prescription   Comments Ronalee Belts is off to a good start with rehab. He has finished 2 full sessions thus far and has been able to tolerate his exercise prescription pretty well. He treadmill was a little challenging for him and we hope to see him improve his endurance on there in order to increase his workload up. Will continue to monitor as he progress. Ronalee Belts is doing well in rehab.  He is already using his elliptical at home.  Reviewed home exercise with pt today.  Pt plans to walk and use elliptical at home for exercise.  Reviewed THR, pulse, RPE, sign and symptoms, pulse oximetery and when to call 911 or MD.  Also discussed weather considerations and indoor options.   Pt voiced understanding. Ronalee Belts is doing great in rehab.  He has already increased to level 4 on the T5 Nustep and level 9 on the recumbent bike, working up to 36 watts. His overall METs have increased to 2.8. His RPEs are staying in appropriate range. Will continue to monitor. Ronalee Belts continues to do well in rehab. He recently increased his overall average MET level to 2.98 METs. He also improved to level 6 on the XR and level 5 on the T5. Ronalee Belts has also done well with his workload on the treadmill at a speed of 2.3 mph and an incline of 0.5%. We will continue to monitor his progress in the program. Ronalee Belts reports that he has been evaluating is workloads weekly and is slowing progressing with intensity levels as he feels able. He reports that he is not limited at home and is able to do what he  needs and wants to do with his physical activity levels.   Expected Outcomes Short: Continue working up tolerance on treadmill to reach initial goal Long: Increase overall MET level Short: Continue to add in exercise at home Long: conitnue to improve stamina Short: Increase handweights to 5 lbs Long: Continue to increase overall MET level Short: Continue to increase workload on the treadmill. Long: Continue to increase strength and stamina. Short: continue to attend rehab consistently for exercise and education. Long:continue to increase strength and stamina and become independent with exercise program.    Row Name 08/11/22 1453             Exercise Goal Re-Evaluation   Exercise Goals Review Increase Physical Activity;Increase Strength and Stamina;Understanding of Exercise Prescription       Comments Ronalee Belts continues to do well in rehab and is close to graduating. He recently improved on his post 6MWT by 32.2%! He increased his overall average MET level to 3.51 METs as well. We will continue to monitor his progress until he graduates from the program.       Expected Outcomes Short: graduate Long:continue to increase  strength and stamina and exercise independently.                Discharge Exercise Prescription (Final Exercise Prescription Changes):  Exercise Prescription Changes - 08/11/22 1400       Response to Exercise   Blood Pressure (Admit) 126/64    Blood Pressure (Exit) 120/66    Heart Rate (Admit) 66 bpm    Heart Rate (Exercise) 99 bpm    Heart Rate (Exit) 75 bpm    Rating of Perceived Exertion (Exercise) 13    Symptoms none    Duration Continue with 30 min of aerobic exercise without signs/symptoms of physical distress.    Intensity THRR unchanged      Progression   Progression Continue to progress workloads to maintain intensity without signs/symptoms of physical distress.    Average METs 3.51      Resistance Training   Training Prescription Yes    Weight 6 lb    Reps 10-15      Interval Training   Interval Training No      Treadmill   MPH 2.6    Grade 0.5    Minutes 15    METs 3.17      NuStep   Level 7    Minutes 15      REL-XR   Level 8    Minutes 15    METs 4.2      T5 Nustep   Level 7    Minutes 15      Home Exercise Plan   Plans to continue exercise at Home (comment)   walking, elliptical   Frequency Add 2 additional days to program exercise sessions.    Initial Home Exercises Provided 06/19/22      Oxygen   Maintain Oxygen Saturation 88% or higher             Nutrition:  Target Goals: Understanding of nutrition guidelines, daily intake of sodium <1564m, cholesterol <2012m calories 30% from fat and 7% or less from saturated fats, daily to have 5 or more servings of fruits and vegetables.  Education: All About Nutrition: -Group instruction provided by verbal, written material, interactive activities, discussions, models, and posters to present general guidelines for heart healthy nutrition including fat, fiber, MyPlate, the role of sodium in heart healthy nutrition, utilization of the nutrition label, and utilization of  this knowledge for  meal planning. Follow up email sent as well. Written material given at graduation. Flowsheet Row Cardiac Rehab from 08/05/2022 in [facility] Cardiac and Pulmonary Rehab  Date 07/29/22  Educator [facility]  Instruction Review Code 1- Verbalizes Understanding       Biometrics:  Pre Biometrics - 06/04/22 1206       Pre Biometrics   Height 6' 1.75" (1.873 m)    Weight 264 lb 14.4 oz (120.2 kg)    BMI (Calculated) 34.25    Single Leg Stand 3.5 seconds             Post Biometrics - 08/07/22 1134        Post  Biometrics   Height 6' 1.75" (1.873 m)    Weight 269 lb 1.6 oz (122.1 kg)    BMI (Calculated) 34.79    Single Leg Stand 1.9 seconds             Nutrition Therapy Plan and Nutrition Goals:  Nutrition Therapy & Goals - 06/15/22 1205       Nutrition Therapy   RD appointment deferred Yes   Pt reports following diet for his diabetes and does not wish to meet with RD at this time. Will continue to follow up.     Personal Nutrition Goals   Nutrition Goal Pt reports following diet for his diabetes and does not wish to meet with RD at this time. Will continue to follow up.             Nutrition Assessments:  MEDIFICTS Score Key: ?70 Need to make dietary changes  40-70 Heart Healthy Diet ? 40 Therapeutic Level Cholesterol Diet  Flowsheet Row Cardiac Rehab from 08/17/2022 in [facility] Cardiac and Pulmonary Rehab  Picture Your Plate Total Score on Discharge 58      Picture Your Plate Scores: <73 Unhealthy dietary pattern with much room for improvement. 41-50 Dietary pattern unlikely to meet recommendations for good health and room for improvement. 51-60 More healthful dietary pattern, with some room for improvement.  >60 Healthy dietary pattern, although there may be some specific behaviors that could be improved.    Nutrition Goals Re-Evaluation:  Nutrition Goals Re-Evaluation     [place] Name 06/19/22 1122 07/27/22 1114           Goals   Nutrition Goal Pt reports  following diet for his diabetes and does not wish to meet with RD at this time. Will continue to follow up. Pt reports following diet for his diabetes and does not wish to meet with RD at this time. Will continue to follow up.      Comment Ronalee Belts is good about watching his diet. He limits his carbs but still get lots of fruits and vegetables.  He is good about getting lean protein and balancing carbs. Ronalee Belts continues to follow his dietarty plan that he started several years ago when he became diabetic. He reports limiting  his carbs and eats a well balanced diet.      Expected Outcome Continue to manage diet for diabetes and his heart Continue to manage diet for diabetes and his heart               Nutrition Goals Discharge (Final Nutrition Goals Re-Evaluation):  Nutrition Goals Re-Evaluation - 07/27/22 1114       Goals   Nutrition Goal Pt reports following diet for his diabetes and does not wish to meet with RD at this time. Will continue to follow up.  Comment Ronalee Belts continues to follow his dietarty plan that he started several years ago when he became diabetic. He reports limiting  his carbs and eats a well balanced diet.    Expected Outcome Continue to manage diet for diabetes and his heart             Psychosocial: Target Goals: Acknowledge presence or absence of significant depression and/or stress, maximize coping skills, provide positive support system. Participant is able to verbalize types and ability to use techniques and skills needed for reducing stress and depression.   Education: Stress, Anxiety, and Depression - Group verbal and visual presentation to define topics covered.  Reviews how body is impacted by stress, anxiety, and depression.  Also discusses healthy ways to reduce stress and to treat/manage anxiety and depression.  Written material given at graduation. Flowsheet Row Cardiac Rehab from 08/05/2022 in [facility] Cardiac and Pulmonary Rehab  Date 06/24/22  Educator [facility]   Instruction Review Code 1- [employer] Understanding       Education: Sleep Hygiene -Provides group verbal and written instruction about how sleep can affect your health.  Define sleep hygiene, discuss sleep cycles and impact of sleep habits. Review good sleep hygiene tips.    Initial Review & Psychosocial Screening:  Initial Psych Review & Screening - 05/27/22 1312       Initial Review   Current issues with None Identified      Family Dynamics   Good Support System? Yes   wife     Barriers   Psychosocial barriers to participate in program There are no identifiable barriers or psychosocial needs.      Screening Interventions   Interventions Encouraged to exercise;To provide support and resources with identified psychosocial needs;Provide feedback about the scores to participant    Expected Outcomes Short Term goal: Utilizing psychosocial counselor, staff and physician to assist with identification of specific Stressors or current issues interfering with healing process. Setting desired goal for each stressor or current issue identified.;Long Term Goal: Stressors or current issues are controlled or eliminated.;Short Term goal: Identification and review with participant of any Quality of Life or Depression concerns found by scoring the questionnaire.;Long Term goal: The participant improves quality of Life and PHQ9 Scores as seen by post scores and/or verbalization of changes             Quality of Life Scores:   Quality of Life - 08/17/22 1339       Quality of Life   Select Quality of Life      Quality of Life Scores   Health/Function Post 20.6 %    Socioeconomic Post 22.81 %    Psych/Spiritual Post 21.79 %    Family Post 28.3 %    GLOBAL Post 22.44 %            Scores of 19 and below usually indicate a poorer quality of life in these areas.  A difference of  2-3 points is a clinically meaningful difference.  A difference of 2-3 points in the total score of the  Quality of Life Index has been associated with significant improvement in overall quality of life, self-image, physical symptoms, and general health in studies assessing change in quality of life.  PHQ-9: Review Flowsheet       08/17/2022 06/04/2022 05/04/2017  Depression screen PHQ 2/9  Decreased Interest 1 0 0  Down, Depressed, Hopeless 0 0 0  PHQ - 2 Score 1 0 0  Altered sleeping 1 1 -  Tired,  decreased energy 2 1 -  Change in appetite 1 0 -  Feeling bad or failure about yourself  0 1 -  Trouble concentrating 0 0 -  Moving slowly or fidgety/restless 0 0 -  Suicidal thoughts 0 0 -  PHQ-9 Score 5 3 -  Difficult doing work/chores Somewhat difficult Somewhat difficult -   Interpretation of Total Score  Total Score Depression Severity:  1-4 = Minimal depression, 5-9 = Mild depression, 10-14 = Moderate depression, 15-19 = Moderately severe depression, 20-27 = Severe depression   Psychosocial Evaluation and Intervention:  Psychosocial Evaluation - 05/27/22 1324       Psychosocial Evaluation & Interventions   Interventions Encouraged to exercise with the program and follow exercise prescription    Comments Ronalee Belts ahs no barriers to attending the program. He lives with his wife and she is his support. They have been married over 25 years. He wants to learn about taking care of himself with heart disease. He does have diabetes and has attended the Diabetes Education in the past.  He is consciuosly working on weight loss. He has about 15 lbs more he would like to lose. He is ready to get started    Expected Outcomes STG: mike attends all scheduled sessions. He gains knowledge to work on caring for himself as he lives with heart disease. LTG Ronalee Belts continues with exercsie progression and caring for himself in a heart healthy manor.    Continue Psychosocial Services  Follow up required by staff             Psychosocial Re-Evaluation:  Psychosocial Re-Evaluation     [place] Name 06/19/22 1120  07/27/22 1116           Psychosocial Re-Evaluation   Current issues with Current Stress Concerns Current Stress Concerns      Comments Ronalee Belts is doing well in rehab.  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  His two dogs are getting near the end of their life and are in heart failure.  He is coping with their inevitable loss.  He sleeps good for most part and has rare nights of not sleeping as well. Make reports no new stress or sleep concerns. He feels like he can manage his work stress. He reports sleeping well and getting good rest most nights.      Expected Outcomes Short: conitnue to cope with work Long: Exercise for mental boost Short: conitnue to cope with work Long: Exercise for mental boost      [place]  -- Follow up required by staff               Psychosocial Discharge (Final Psychosocial Re-Evaluation):  Psychosocial Re-Evaluation - 07/27/22 1116       Psychosocial Re-Evaluation   Current issues with Current Stress Concerns    Comments Make reports no new stress or sleep concerns. He feels like he can manage his work stress. He reports sleeping well and getting good rest most nights.    Expected Outcomes Short: conitnue to cope with work Long: Exercise for mental boost    Continue Psychosocial Services  Follow up required by staff             Vocational Rehabilitation: Provide vocational rehab assistance to qualifying candidates.   Vocational Rehab Evaluation & Intervention:  Vocational Rehab - 05/27/22 1328       Initial Vocational Rehab Evaluation & Intervention   Assessment shows need  for Vocational Rehabilitation No      Vocational Rehab Re-Evaulation   Comments Retierd      Discharge Vocational Rehab   Discharge Vocational Rehabilitation RETIRED             Education: Education Goals: Education classes will be provided on a variety of topics geared toward better understanding of  heart health and risk factor modification. Participant will state understanding/return demonstration of topics presented as noted by education test scores.  Learning Barriers/Preferences:   General Cardiac Education Topics:  AED/CPR: - Group verbal and written instruction with the use of models to demonstrate the basic use of the AED with the basic ABC's of resuscitation.   Anatomy and Cardiac Procedures: - Group verbal and visual presentation and models provide information about basic cardiac anatomy and function. Reviews the testing methods done to diagnose heart disease and the outcomes of the test results. Describes the treatment choices: Medical Management, Angioplasty, or Coronary Bypass Surgery for treating various heart conditions including Myocardial Infarction, Angina, Valve Disease, and Cardiac Arrhythmias.  Written material given at graduation. Flowsheet Row Cardiac Rehab from 08/05/2022 in [facility] Cardiac and Pulmonary Rehab  Education need identified 06/04/22  Date 07/22/22  Educator SB  Instruction Review Code 1- Verbalizes Understanding       Medication Safety: - Group verbal and visual instruction to review commonly prescribed medications for heart and lung disease. Reviews the medication, class of the drug, and side effects. Includes the steps to properly store meds and maintain the prescription regimen.  Written material given at graduation. Flowsheet Row Cardiac Rehab from 08/05/2022 in [facility] Cardiac and Pulmonary Rehab  Date 08/05/22  Educator SB  Instruction Review Code 1- Verbalizes Understanding       Intimacy: - Group verbal instruction through game format to discuss how heart and lung disease can affect sexual intimacy. Written material given at graduation.. Flowsheet Row Cardiac Rehab from 08/05/2022 in [facility] Cardiac and Pulmonary Rehab  Date 07/08/22  Educator [facility]  Instruction Review Code 1- Verbalizes Understanding       Know Your Numbers and Heart  Failure: - Group verbal and visual instruction to discuss disease risk factors for cardiac and pulmonary disease and treatment options.  Reviews associated critical values for Overweight/Obesity, Hypertension, Cholesterol, and Diabetes.  Discusses basics of heart failure: signs/symptoms and treatments.  Introduces Heart Failure Zone chart for action plan for heart failure.  Written material given at graduation. Flowsheet Row Cardiac Rehab from 08/05/2022 in [facility] Cardiac and Pulmonary Rehab  Date 06/10/22  Educator SB  Instruction Review Code 1- Verbalizes Understanding       Infection Prevention: - Provides verbal and written material to individual with discussion of infection control including proper hand washing and proper equipment cleaning during exercise session. Flowsheet Row Cardiac Rehab from 08/05/2022 in [facility] Cardiac and Pulmonary Rehab  Education need identified 06/04/22  Date 06/04/22  Educator [place]  Instruction Review Code 1- Verbalizes Understanding       Falls Prevention: - Provides verbal and written material to individual with discussion of falls prevention and safety. Flowsheet Row Cardiac Rehab from 05/27/2022 in [facility] Cardiac and Pulmonary Rehab  Date 05/27/22  Educator SB  Instruction Review Code 1- Verbalizes Understanding       Other: -Provides group and verbal instruction on various topics (see comments)   Knowledge Questionnaire Score:  Knowledge Questionnaire Score - 08/17/22 1335       Knowledge Questionnaire Score   Post Score 25/26  Core Components/Risk Factors/Patient Goals at Admission:  Personal Goals and Risk Factors at Admission - 06/04/22 1340       Core Components/Risk Factors/Patient Goals on Admission    Weight Management Yes;Weight Loss;Obesity    Intervention Weight Management: Develop a combined nutrition and exercise program designed to reach desired caloric intake, while maintaining appropriate intake of nutrient  and fiber, sodium and fats, and appropriate energy expenditure required for the weight goal.;Weight Management: Provide education and appropriate resources to help participant work on and attain dietary goals.;Weight Management/Obesity: Establish reasonable short term and long term weight goals.    Admit Weight 264 lb (119.7 kg)    Goal Weight: Short Term 259 lb (117.5 kg)    Goal Weight: Long Term 250 lb (113.4 kg)    Expected Outcomes Short Term: Continue to assess and modify interventions until short term weight is achieved;Long Term: Adherence to nutrition and physical activity/exercise program aimed toward attainment of established weight goal;Weight Loss: Understanding of general recommendations for a balanced deficit meal plan, which promotes 1-2 lb weight loss per week and includes a negative energy balance of [phone] kcal/d;Understanding recommendations for meals to include 15-35% energy as protein, 25-35% energy from fat, 35-60% energy from carbohydrates, less than 244m of dietary cholesterol, 20-35 gm of total fiber daily;Understanding of distribution of calorie intake throughout the day with the consumption of 4-5 meals/snacks    Diabetes Yes    Intervention Provide education about signs/symptoms and action to take for hypo/hyperglycemia.;Provide education about proper nutrition, including hydration, and aerobic/resistive exercise prescription along with prescribed medications to achieve blood glucose in normal ranges: Fasting glucose 65-99 mg/dL    Expected Outcomes Short Term: Participant verbalizes understanding of the signs/symptoms and immediate care of hyper/hypoglycemia, proper foot care and importance of medication, aerobic/resistive exercise and nutrition plan for blood glucose control.;Long Term: Attainment of HbA1C < 7%.    Hypertension Yes    Intervention Provide education on lifestyle modifcations including regular physical activity/exercise, weight management, moderate sodium  restriction and increased consumption of fresh fruit, vegetables, and low fat dairy, alcohol moderation, and smoking cessation.;Monitor prescription use compliance.    Expected Outcomes Short Term: Continued assessment and intervention until BP is < 140/92mHG in hypertensive participants. < 130/8047mG in hypertensive participants with diabetes, heart failure or chronic kidney disease.;Long Term: Maintenance of blood pressure at goal levels.    Lipids Yes    Intervention Provide education and support for participant on nutrition & aerobic/resistive exercise along with prescribed medications to achieve LDL <19m12mDL >40mg30m Expected Outcomes Short Term: Participant states understanding of desired cholesterol values and is compliant with medications prescribed. Participant is following exercise prescription and nutrition guidelines.;Long Term: Cholesterol controlled with medications as prescribed, with individualized exercise RX and with personalized nutrition plan. Value goals: LDL < 19mg,13m > 40 mg.             Education:Diabetes - Individual verbal and written instruction to review signs/symptoms of diabetes, desired ranges of glucose level fasting, after meals and with exercise. Acknowledge that pre and post exercise glucose checks will be done for 3 sessions at entry of program. [place]  Education need identified 06/04/22  Date 06/04/22  Educator [place]  In[place] Review Code 1- Verbalizes Understanding       Core Components/Risk Factors/Patient Goals Review:   Goals and Risk Factor Review     Row Name 06/19/22 1122 07/27/22 1109  Core Components/Risk Factors/Patient Goals Review   Personal Goals Review Weight Management/Obesity;Hypertension;Diabetes;Lipids Weight Management/Obesity;Hypertension;Diabetes;Lipids      Review Ronalee Belts is doing well in rehab.  His weight is staying steady.  His pressures  have been good here in class.  He does check them at home but his machine at home does not like his PVCs.  His sugars are doing well.  He is hoping to get his A1c down under 6 when he checks again in October. Ronalee Belts reports that he takes all medications as prescribed. He does have a blood pressure cuff and and blood glucose meter at home but does not regularly check them. He states he can usually feel when blood pressure and blood sugar are not withing acceptable ranges. He was encouraged to check these at home and has the equipment to do so. His weight has been steady but he continues to attend rehab consistently and work towards his weight loss goal.      Expected Outcomes Short: Continue to work on weight loss Long: Continue to monitor risk factors Short: Continue to work on weight loss and monitor BP and BG at home.  Long: Continue to monitor risk factors               Core Components/Risk Factors/Patient Goals at Discharge (Final Review):   Goals and Risk Factor Review - 07/27/22 1109       Core Components/Risk Factors/Patient Goals Review   Personal Goals Review Weight Management/Obesity;Hypertension;Diabetes;Lipids    Review Ronalee Belts reports that he takes all medications as prescribed. He does have a blood pressure cuff and and blood glucose meter at home but does not regularly check them. He states he can usually feel when blood pressure and blood sugar are not withing acceptable ranges. He was encouraged to check these at home and has the equipment to do so. His weight has been steady but he continues to attend rehab consistently and work towards his weight loss goal.    Expected Outcomes Short: Continue to work on weight loss and monitor BP and BG at home.  Long: Continue to monitor risk factors             ITP Comments:  ITP Comments     Row Name 05/27/22 1329 06/04/22 1134 06/04/22 1341 07/08/22 1406 08/05/22 0806   ITP Comments Virtual orientation call completed today. he has an  appointment on Date: 06/04/2022  for EP eval and gym Orientation.  Documentation of diagnosis can be found in [facility] Date: 05/13/2022 . Completed 6MWT and gym orientation. Initial ITP created and sent for review to Dr. Ramonita Lab. EKG Strips sent for review. 30 Day review completed. Medical Director ITP review done, changes made as directed, and signed approval by Medical Director. 30 Day review completed. Medical Director ITP review done, changes made as directed, and signed approval by Medical Director.    Row Name 08/19/22 1129           ITP Comments Hubert graduated today from  rehab with 36 sessions completed.  Details of the patient's exercise prescription and what He needs to do in order to continue the prescription and progress were discussed with patient.  Patient was given a copy of prescription and goals.  Patient verbalized understanding.  Darvis plans to continue to exercise by walking / elliptical.                Comments: Discharge ITP

## 2022-08-19 NOTE — Progress Notes (Signed)
Discharge Summary: Nabeel Gladson (DOB: Dec 25, 2050)  Lynann Bologna graduated today from  rehab with 36 sessions completed.  Details of the patient's exercise prescription and what He needs to do in order to continue the prescription and progress were discussed with patient.  Patient was given a copy of prescription and goals.  Patient verbalized understanding.  Wilfred plans to continue to exercise by walking / elliptical.   6 Minute Walk     Row Name 06/04/22 1206 08/07/22 1134       6 Minute Walk   Phase Initial Discharge    Distance 1210 feet 1600 feet    Distance % Change -- 32.2 %    Distance Feet Change -- 390 ft    Walk Time 6 minutes 6 minutes    # of Rest Breaks 0 0    MPH 2.29 3.03    METS 2.05 2.87    RPE 8 11    Perceived Dyspnea  1 1    VO2 Peak 7.19 10.04    Symptoms No Yes (comment)    Comments -- SOB    Resting HR 55 bpm 66 bpm    Resting BP 118/72 126/64    Resting Oxygen Saturation  94 % 94 %    Exercise Oxygen Saturation  during 6 min walk 98 % 96 %    Max Ex. HR 76 bpm 99 bpm    Max Ex. BP 136/64 128/74    2 Minute Post BP 126/66 --

## 2022-08-19 NOTE — Progress Notes (Signed)
Daily Session Note  Patient Details  Name: Nicholas Abbott MRN: [id number] Date of Birth: [birth year] Referring Provider:   Flowsheet Row Cardiac Rehab from 06/04/2022 in [facility] Cardiac and Pulmonary Rehab  Referring Provider Nicholas Angelica MD       Encounter Date: 08/19/2022  Check In:  Session Check In - 08/19/22 1121       Check-In   Supervising physician immediately available to respond to emergencies See telemetry face sheet for immediately available ER MD    Location [facility]-Cardiac & Pulmonary Rehab    Staff Present Antionette Fairy, BS, Exercise Physiologist;Meredith Sherryll Burger, RN BSN;Jessica Luan Pulling, MA, RCEP, CCRP, Mindi Curling, RN, [place]    Virtual Visit No    Medication changes reported     No    Fall or balance concerns reported    No    Warm-up and Cool-down Performed on first and last piece of equipment    Resistance Training Performed Yes    VAD Patient? No    PAD/SET Patient? No      Pain Assessment   Currently in Pain? No/denies                Social History   Tobacco Use  Smoking Status Never  Smokeless Tobacco Never    Goals Met:  Independence with exercise equipment Exercise tolerated well No report of concerns or symptoms today Strength training completed today  Goals Unmet:  Not Applicable  Comments:  Nicholas Abbott graduated today from  rehab with 36 sessions completed.  Details of the patient's exercise prescription and what He needs to do in order to continue the prescription and progress were discussed with patient.  Patient was given a copy of prescription and goals.  Patient verbalized understanding.  Nicholas Abbott plans to continue to exercise by walking / elliptical.    Dr. Emily Abbott is Medical Director for [place].  Dr. Ottie Abbott is Medical Director for [facility] Pulmonary Rehabilitation.

## 2022-09-10 ENCOUNTER — Encounter: Payer: Self-pay | Admitting: Oncology

## 2022-09-10 ENCOUNTER — Inpatient Hospital Stay: Payer: Medicare Other | Attending: Oncology | Admitting: Oncology

## 2022-09-10 ENCOUNTER — Other Ambulatory Visit: Payer: Self-pay

## 2022-09-10 ENCOUNTER — Inpatient Hospital Stay: Payer: Medicare Other

## 2022-09-10 VITALS — BP 151/7 | HR 56 | Temp 97.5°F | Resp 18 | Ht 73.75 in | Wt 273.0 lb

## 2022-09-10 DIAGNOSIS — Z79890 Hormone replacement therapy (postmenopausal): Secondary | ICD-10-CM | POA: Diagnosis not present

## 2022-09-10 DIAGNOSIS — D696 Thrombocytopenia, unspecified: Secondary | ICD-10-CM

## 2022-09-10 DIAGNOSIS — D7589 Other specified diseases of blood and blood-forming organs: Secondary | ICD-10-CM | POA: Diagnosis not present

## 2022-09-10 DIAGNOSIS — Z79899 Other long term (current) drug therapy: Secondary | ICD-10-CM | POA: Insufficient documentation

## 2022-09-10 DIAGNOSIS — Z7902 Long term (current) use of antithrombotics/antiplatelets: Secondary | ICD-10-CM | POA: Insufficient documentation

## 2022-09-10 DIAGNOSIS — E785 Hyperlipidemia, unspecified: Secondary | ICD-10-CM | POA: Insufficient documentation

## 2022-09-10 DIAGNOSIS — I10 Essential (primary) hypertension: Secondary | ICD-10-CM | POA: Insufficient documentation

## 2022-09-10 LAB — CBC
HCT: 47.3 % (ref 39.0–52.0)
Hemoglobin: 16.3 g/dL (ref 13.0–17.0)
MCH: 35.6 pg — ABNORMAL HIGH (ref 26.0–34.0)
MCHC: 34.5 g/dL (ref 30.0–36.0)
MCV: 103.3 fL — ABNORMAL HIGH (ref 80.0–100.0)
Platelets: 89 10*3/uL — ABNORMAL LOW (ref 150–400)
RBC: 4.58 MIL/uL (ref 4.22–5.81)
RDW: 15.5 % (ref 11.5–15.5)
WBC: 8.4 10*3/uL (ref 4.0–10.5)
nRBC: 0 % (ref 0.0–0.2)

## 2022-09-10 LAB — FERRITIN: Ferritin: 301 ng/mL (ref 24–336)

## 2022-09-10 LAB — IRON AND TIBC
Iron: 109 ug/dL (ref 45–182)
Saturation Ratios: 45 % — ABNORMAL HIGH (ref 17.9–39.5)
TIBC: 242 ug/dL — ABNORMAL LOW (ref 250–450)
UIBC: 133 ug/dL

## 2022-09-10 LAB — LACTATE DEHYDROGENASE: LDH: 172 U/L (ref 98–192)

## 2022-09-10 NOTE — Progress Notes (Signed)
[place]  Telephone:(336) [phone] Fax:(336) [phone]  ID: Nicholas Abbott OB: 01-May-1951  MR#: [id number]  YSA#:[id number]  Patient Care Team: Gladstone Lighter, MD as PCP - General (Internal Medicine)  CHIEF COMPLAINT: Thrombocytopenia.  INTERVAL HISTORY: Patient is a 71 year old male who was noted to have a chronic thrombocytopenia and is referred for further evaluation.  He currently feels well and is asymptomatic.  He has no neurologic complaints.  He denies any recent fevers or illnesses.  He has a good appetite and denies weight loss.  He has no chest pain, shortness of breath, cough, or hemoptysis.  He denies any nausea, vomiting, constipation, or diarrhea.  He has no urinary complaints.  Patient feels at his baseline and offers no specific complaints today.  REVIEW OF SYSTEMS:   Review of Systems  Constitutional: Negative.  Negative for fever, malaise/fatigue and weight loss.  Respiratory: Negative.  Negative for cough, hemoptysis and shortness of breath.   Cardiovascular: Negative.  Negative for chest pain and leg swelling.  Gastrointestinal: Negative.  Negative for abdominal pain.  Genitourinary: Negative.  Negative for dysuria.  Musculoskeletal:  Negative for back pain.  Skin: Negative.  Negative for rash.  Neurological: Negative.  Negative for dizziness, focal weakness, weakness and headaches.  Endo/Heme/Allergies:  Does not bruise/bleed easily.  Psychiatric/Behavioral: Negative.  The patient is not nervous/anxious.     As per HPI. Otherwise, a complete review of systems is negative.  PAST MEDICAL HISTORY: Past Medical History:  Diagnosis Date   Diabetes mellitus without complication (HCC)    GERD (gastroesophageal reflux disease)    Hyperlipidemia    Hypertension    Left shoulder pain    Myocardial infarction [facility])     PAST SURGICAL HISTORY: Past Surgical History:  Procedure Laterality Date   CATARACT EXTRACTION     COLONOSCOPY WITH  PROPOFOL N/A 01/01/2020   Procedure: COLONOSCOPY WITH PROPOFOL;  Surgeon: [name], Benay Pike, MD;  Location: [facility] ENDOSCOPY;  Service: Gastroenterology;  Laterality: N/A;   CORONARY ANGIOPLASTY     ESOPHAGOGASTRODUODENOSCOPY (EGD) WITH PROPOFOL N/A 01/01/2020   Procedure: ESOPHAGOGASTRODUODENOSCOPY (EGD) WITH PROPOFOL;  Surgeon: [name], Benay Pike, MD;  Location: [facility] ENDOSCOPY;  Service: Gastroenterology;  Laterality: N/A;   EYE SURGERY     LEFT HEART CATH AND CORONARY ANGIOGRAPHY N/A 04/29/2022   Procedure: LEFT HEART CATH AND CORONARY ANGIOGRAPHY;  Surgeon: Andrez Grime, MD;  Location: [place] [facility];  Service: Cardiovascular;  Laterality: N/A;   RETINAL DETACHMENT SURGERY      FAMILY HISTORY: Family History  Problem Relation Age of Onset   Leukemia Father     ADVANCED DIRECTIVES (Y/N):  N  HEALTH MAINTENANCE: Social History   Tobacco Use   Smoking status: Never   Smokeless tobacco: Never  Vaping Use   Vaping Use: Never used  Substance Use Topics   Alcohol use: Yes    Alcohol/week: 5.0 standard drinks of alcohol    Types: 5 Shots of liquor per week   Drug use: Never     Colonoscopy:  PAP:  Bone density:  Lipid panel:  Allergies  Allergen Reactions   Pioglitazone Rash    Current Outpatient Medications  Medication Sig Dispense Refill   aspirin EC 81 MG tablet Take 81 mg by mouth daily.     Cinnamon 500 MG capsule Take 1,000 mg by mouth 2 (two) times daily as needed (blood sugar control).     clopidogrel (PLAVIX) 75 MG tablet Take 1 tablet (75 mg total) by mouth  daily. 30 tablet 11   Cyanocobalamin (VITAMIN B-12) 5000 MCG TBDP Take 5,000 mcg by mouth daily.      empagliflozin (JARDIANCE) 10 MG TABS tablet Take 1 tablet by mouth daily with breakfast.     folic acid-pyridoxine-cyancobalamin (FOLTX) 2.5-25-2 MG TABS tablet Take 1 tablet by mouth daily.     hydrocortisone 2.5 % cream APPLY TO THE GROIN DAILY ON MONDAY, WEDNESDAY, AND FRIDAY. 28 g 1    levothyroxine (SYNTHROID) 112 MCG tablet Take 112 mcg by mouth daily.     metoprolol succinate (TOPROL-XL) 25 MG 24 hr tablet Take 0.5 tablets (12.5 mg total) by mouth daily. 15 tablet 11   nitroGLYCERIN (NITROSTAT) 0.4 MG SL tablet Place under the tongue.     ramipril (ALTACE) 2.5 MG capsule Take 2.5 mg by mouth daily.     rosuvastatin (CRESTOR) 40 MG tablet Take by mouth.     traMADol (ULTRAM) 50 MG tablet      No current facility-administered medications for this visit.    OBJECTIVE: Vitals:   09/10/22 1103  BP: (!) 151/7  Pulse: (!) 56  Resp: 18  Temp: (!) 97.5 F (36.4 C)  SpO2: 99%     Body mass index is 35.29 kg/m.    ECOG FS:0 - Asymptomatic  General: Well-developed, well-nourished, no acute distress. Eyes: Pink conjunctiva, anicteric sclera. HEENT: Normocephalic, moist mucous membranes. Lungs: No audible wheezing or coughing. Heart: Regular rate and rhythm. Abdomen: Soft, nontender, no obvious distention. Musculoskeletal: No edema, cyanosis, or clubbing. Neuro: Alert, answering all questions appropriately. Cranial nerves grossly intact. Skin: No rashes or petechiae noted. Psych: Normal affect. Lymphatics: No cervical, calvicular, axillary or inguinal LAD.   LAB RESULTS:  Lab Results  Component Value Date   NA 137 01/31/2020   K 4.0 01/31/2020   CL 110 01/31/2020   CO2 22 01/31/2020   GLUCOSE 221 (H) 01/31/2020   BUN 15 01/31/2020   CREATININE 0.76 01/31/2020   CALCIUM 8.2 (L) 01/31/2020   PROT 6.3 (L) 01/28/2020   ALBUMIN 3.2 (L) 01/28/2020   AST 67 (H) 01/28/2020   ALT 63 (H) 01/28/2020   ALKPHOS 66 01/28/2020   BILITOT 2.7 (H) 01/28/2020   GFRNONAA >60 01/31/2020   GFRAA >60 01/31/2020    Lab Results  Component Value Date   WBC 8.4 09/10/2022   NEUTROABS 6.5 01/28/2020   HGB 16.3 09/10/2022   HCT 47.3 09/10/2022   MCV 103.3 (H) 09/10/2022   PLT 89 (L) 09/10/2022   Lab Results  Component Value Date   IRON 109 09/10/2022   TIBC 242 (L)  09/10/2022   IRONPCTSAT 45 (H) 09/10/2022   Lab Results  Component Value Date   FERRITIN 301 09/10/2022     STUDIES: No results found.  ASSESSMENT: Thrombocytopenia.  PLAN:    Thrombocytopenia: Upon review of patient's chart, he was noted to have a decreased platelet count ranging from 80-104 since December 2020.  His platelet count today is 89.  Abdominal ultrasound on February 17, 2022 revealed a mildly enlarged spleen of 13 cm which may be contributing.  Iron stores, B12, and folate are within normal limits.  The remainder of patient's laboratory work including platelet antibody profile, SPEP, flow cytometry, and IntelliGEN myeloid panel are pending at time of dictation.  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  No intervention is needed.  Patient does not require bone marrow biopsy.  Patient will have video-assisted telemedicine visit in 3 to 4 weeks for further evaluation and  discussion of his laboratory results. Macrocytosis: Patient noted to have an MCV of 103.3.  B12 and folate are within normal limits.  Pending laboratory work as above.  I spent a total of 45 minutes reviewing chart data, face-to-face evaluation with the patient, counseling and coordination of care as detailed above.   Patient expressed understanding and was in agreement with this plan. He also understands that He can call clinic at any time with any questions, concerns, or complaints.    Cancer Staging  No matching staging information was found for the patient.  Lloyd Huger, MD   09/11/2022 7:03 AM

## 2022-09-12 LAB — PLATELET ANTIBODY PROFILE
Glycoprotein IV Antibody: NEGATIVE
HLA Ab Ser Ql EIA: NEGATIVE
IA/IIA Antibody: NEGATIVE
IB/IX Antibody: NEGATIVE
IIB/IIIA Antibody: NEGATIVE

## 2022-09-14 LAB — COMP PANEL: LEUKEMIA/LYMPHOMA: Immunophenotypic Profile: 27

## 2022-09-14 LAB — PROTEIN ELECTROPHORESIS, SERUM
A/G Ratio: 1 (ref 0.7–1.7)
Albumin ELP: 3 g/dL (ref 2.9–4.4)
Alpha-1-Globulin: 0.2 g/dL (ref 0.0–0.4)
Alpha-2-Globulin: 0.5 g/dL (ref 0.4–1.0)
Beta Globulin: 0.8 g/dL (ref 0.7–1.3)
Gamma Globulin: 1.4 g/dL (ref 0.4–1.8)
Globulin, Total: 2.9 g/dL (ref 2.2–3.9)
Total Protein ELP: 5.9 g/dL — ABNORMAL LOW (ref 6.0–8.5)

## 2022-09-29 LAB — INTELLIGEN MYELOID

## 2022-10-02 ENCOUNTER — Encounter: Payer: Self-pay | Admitting: Oncology

## 2022-10-02 ENCOUNTER — Inpatient Hospital Stay: Payer: Medicare Other | Attending: Oncology | Admitting: Oncology

## 2022-10-02 DIAGNOSIS — D696 Thrombocytopenia, unspecified: Secondary | ICD-10-CM

## 2022-10-02 NOTE — Progress Notes (Signed)
Patient has no questions or concerns today.

## 2022-10-02 NOTE — Progress Notes (Signed)
[place]  Telephone:(336) [phone] Fax:(336) [phone]  ID: Spiros Greenfeld OB: April 06, 1951  MR#: [id number]  ZHG#:[id number]  Patient Care Team: Gladstone Lighter, MD as PCP - General (Internal Medicine)  I connected with Tammi Klippel on 10/02/22 at 11:15 AM EST by video enabled telemedicine visit and verified that I am speaking with the correct person using two identifiers.   I discussed the limitations, risks, security and privacy concerns of performing an evaluation and management service by telemedicine and the availability of in-person appointments. I also discussed with the patient that there may be a patient responsible charge related to this service. The patient expressed understanding and agreed to proceed.   Other persons participating in the visit and their role in the encounter: Patient, MD.  Patient's location: Home. Provider's location: Clinic.  CHIEF COMPLAINT: Thrombocytopenia.  INTERVAL HISTORY: Patient agreed to video assisted telemedicine visit for further evaluation and discussion of his laboratory results.  He continues to feel well and remains asymptomatic. He has no neurologic complaints.  He denies any recent fevers or illnesses.  He has a good appetite and denies weight loss.  He has no chest pain, shortness of breath, cough, or hemoptysis.  He denies any nausea, vomiting, constipation, or diarrhea.  He has no urinary complaints.  Patient offers no specific complaints today.  REVIEW OF SYSTEMS:   Review of Systems  Constitutional: Negative.  Negative for fever, malaise/fatigue and weight loss.  Respiratory: Negative.  Negative for cough, hemoptysis and shortness of breath.   Cardiovascular: Negative.  Negative for chest pain and leg swelling.  Gastrointestinal: Negative.  Negative for abdominal pain.  Genitourinary: Negative.  Negative for dysuria.  Musculoskeletal:  Negative for back pain.  Skin: Negative.  Negative for rash.   Neurological: Negative.  Negative for dizziness, focal weakness, weakness and headaches.  Endo/Heme/Allergies:  Does not bruise/bleed easily.  Psychiatric/Behavioral: Negative.  The patient is not nervous/anxious.     As per HPI. Otherwise, a complete review of systems is negative.  PAST MEDICAL HISTORY: Past Medical History:  Diagnosis Date   Diabetes mellitus without complication (HCC)    GERD (gastroesophageal reflux disease)    Hyperlipidemia    Hypertension    Left shoulder pain    Myocardial infarction [facility])     PAST SURGICAL HISTORY: Past Surgical History:  Procedure Laterality Date   CATARACT EXTRACTION     COLONOSCOPY WITH PROPOFOL N/A 01/01/2020   Procedure: COLONOSCOPY WITH PROPOFOL;  Surgeon: [name], Benay Pike, MD;  Location: [facility] ENDOSCOPY;  Service: Gastroenterology;  Laterality: N/A;   CORONARY ANGIOPLASTY     ESOPHAGOGASTRODUODENOSCOPY (EGD) WITH PROPOFOL N/A 01/01/2020   Procedure: ESOPHAGOGASTRODUODENOSCOPY (EGD) WITH PROPOFOL;  Surgeon: [name], Benay Pike, MD;  Location: [facility] ENDOSCOPY;  Service: Gastroenterology;  Laterality: N/A;   EYE SURGERY     LEFT HEART CATH AND CORONARY ANGIOGRAPHY N/A 04/29/2022   Procedure: LEFT HEART CATH AND CORONARY ANGIOGRAPHY;  Surgeon: Andrez Grime, MD;  Location: [place] [facility];  Service: Cardiovascular;  Laterality: N/A;   RETINAL DETACHMENT SURGERY      FAMILY HISTORY: Family History  Problem Relation Age of Onset   Leukemia Father     ADVANCED DIRECTIVES (Y/N):  N  HEALTH MAINTENANCE: Social History   Tobacco Use   Smoking status: Never   Smokeless tobacco: Never  Vaping Use   Vaping Use: Never used  Substance Use Topics   Alcohol use: Yes    Alcohol/week: 5.0 standard drinks of alcohol  Types: 5 Shots of liquor per week   Drug use: Never     Colonoscopy:  PAP:  Bone density:  Lipid panel:  Allergies  Allergen Reactions   Pioglitazone Rash    Current Outpatient Medications  Medication  Sig Dispense Refill   Alirocumab 75 MG/ML SOAJ Inject into the skin.     aspirin EC 81 MG tablet Take 81 mg by mouth daily.     CHROMIUM PO Take 1 capsule by mouth daily.     Cinnamon 500 MG capsule Take 1,000 mg by mouth 2 (two) times daily as needed (blood sugar control).     clopidogrel (PLAVIX) 75 MG tablet Take 1 tablet (75 mg total) by mouth daily. 30 tablet 11   Cyanocobalamin (VITAMIN B-12) 5000 MCG TBDP Take 5,000 mcg by mouth daily.      empagliflozin (JARDIANCE) 10 MG TABS tablet Take 1 tablet by mouth daily with breakfast.     folic acid-pyridoxine-cyancobalamin (FOLTX) 2.5-25-2 MG TABS tablet Take 1 tablet by mouth daily.     hydrocortisone 2.5 % cream APPLY TO THE GROIN DAILY ON MONDAY, WEDNESDAY, AND FRIDAY. 28 g 1   levothyroxine (SYNTHROID) 112 MCG tablet Take 112 mcg by mouth daily.     metoprolol succinate (TOPROL-XL) 25 MG 24 hr tablet Take 0.5 tablets (12.5 mg total) by mouth daily. 15 tablet 11   nitroGLYCERIN (NITROSTAT) 0.4 MG SL tablet Place under the tongue.     PHOSPHATIDYLSERINE PO Take by mouth.     ramipril (ALTACE) 2.5 MG capsule Take 2.5 mg by mouth daily.     rosuvastatin (CRESTOR) 40 MG tablet Take by mouth.     tadalafil (CIALIS) 5 MG tablet Take by mouth.     traMADol (ULTRAM) 50 MG tablet      No current facility-administered medications for this visit.    OBJECTIVE: There were no vitals filed for this visit.    There is no height or weight on file to calculate BMI.    ECOG FS:0 - Asymptomatic  General: Well-developed, well-nourished, no acute distress. HEENT: Normocephalic. Neuro: Alert, answering all questions appropriately. Cranial nerves grossly intact. Psych: Normal affect.   LAB RESULTS:  Lab Results  Component Value Date   NA 137 01/31/2020   K 4.0 01/31/2020   CL 110 01/31/2020   CO2 22 01/31/2020   GLUCOSE 221 (H) 01/31/2020   BUN 15 01/31/2020   CREATININE 0.76 01/31/2020   CALCIUM 8.2 (L) 01/31/2020   PROT 6.3 (L) 01/28/2020    ALBUMIN 3.2 (L) 01/28/2020   AST 67 (H) 01/28/2020   ALT 63 (H) 01/28/2020   ALKPHOS 66 01/28/2020   BILITOT 2.7 (H) 01/28/2020   GFRNONAA >60 01/31/2020   GFRAA >60 01/31/2020    Lab Results  Component Value Date   WBC 8.4 09/10/2022   NEUTROABS 6.5 01/28/2020   HGB 16.3 09/10/2022   HCT 47.3 09/10/2022   MCV 103.3 (H) 09/10/2022   PLT 89 (L) 09/10/2022   Lab Results  Component Value Date   IRON 109 09/10/2022   TIBC 242 (L) 09/10/2022   IRONPCTSAT 45 (H) 09/10/2022   Lab Results  Component Value Date   FERRITIN 301 09/10/2022     STUDIES: No results found.  ASSESSMENT: Thrombocytopenia.  PLAN:    Thrombocytopenia: Upon review of patient's chart, he was noted to have a decreased platelet count ranging from 80-104 since December 2020.  His most recent platelet count is 89.  Abdominal ultrasound on February 17, 2022   revealed a mildly enlarged spleen of 13 cm which may be contributing.  Platelet antibody profile, SPEP, iron stores, B12, and folate are negative or within normal limits.  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  No intervention is needed.  Patient does not require bone marrow biopsy.  Return to clinic in 6 months with repeat laboratory work and video assisted telemedicine visit.   Macrocytosis: Patient noted to have an MCV of 103.3.  B12 and folate are within normal limits.  Monitor. Clonal B-cell population: Noted in 27% of leukocytes on peripheral blood flow cytometry.  Phenotype detected is not typical for hairy cell leukemia in the absence of the CD25 expression, but a variant remains in the differential diagnosis.  No intervention is needed at this time.  Repeat peripheral blood flow cytometry with next clinic visit.  I provided 20 minutes of face-to-face video visit time during this encounter which included chart review, counseling, and coordination of care as documented  above.    Patient expressed understanding and was in agreement with this plan. He also understands that He can call clinic at any time with any questions, concerns, or complaints.    Cancer Staging  No matching staging information was found for the patient.  Timothy J Finnegan, MD   10/02/2022 3:34 PM     

## 2022-12-08 ENCOUNTER — Other Ambulatory Visit: Payer: Self-pay | Admitting: Dermatology

## 2022-12-08 DIAGNOSIS — B356 Tinea cruris: Secondary | ICD-10-CM

## 2022-12-10 ENCOUNTER — Encounter: Payer: Self-pay | Admitting: Dermatology

## 2022-12-10 ENCOUNTER — Ambulatory Visit (INDEPENDENT_AMBULATORY_CARE_PROVIDER_SITE_OTHER): Payer: Medicare Other | Admitting: Dermatology

## 2022-12-10 VITALS — BP 137/81

## 2022-12-10 DIAGNOSIS — D692 Other nonthrombocytopenic purpura: Secondary | ICD-10-CM

## 2022-12-10 DIAGNOSIS — L814 Other melanin hyperpigmentation: Secondary | ICD-10-CM

## 2022-12-10 DIAGNOSIS — Z85828 Personal history of other malignant neoplasm of skin: Secondary | ICD-10-CM

## 2022-12-10 DIAGNOSIS — D229 Melanocytic nevi, unspecified: Secondary | ICD-10-CM

## 2022-12-10 DIAGNOSIS — D1801 Hemangioma of skin and subcutaneous tissue: Secondary | ICD-10-CM

## 2022-12-10 DIAGNOSIS — L409 Psoriasis, unspecified: Secondary | ICD-10-CM | POA: Diagnosis not present

## 2022-12-10 DIAGNOSIS — Z79899 Other long term (current) drug therapy: Secondary | ICD-10-CM

## 2022-12-10 DIAGNOSIS — L821 Other seborrheic keratosis: Secondary | ICD-10-CM

## 2022-12-10 DIAGNOSIS — L578 Other skin changes due to chronic exposure to nonionizing radiation: Secondary | ICD-10-CM | POA: Diagnosis not present

## 2022-12-10 DIAGNOSIS — Z1283 Encounter for screening for malignant neoplasm of skin: Secondary | ICD-10-CM

## 2022-12-10 DIAGNOSIS — D225 Melanocytic nevi of trunk: Secondary | ICD-10-CM

## 2022-12-10 MED ORDER — ZORYVE 0.3 % EX CREA: 1.0000 | TOPICAL_CREAM | CUTANEOUS | 11 refills | Status: DC

## 2022-12-10 NOTE — Progress Notes (Signed)
Follow-Up Visit   Subjective  Nicholas Abbott is a 72 y.o. male who presents for the following: Psoriasis (Ankles, hands, ~68yrf/u, pt not using any medications, pt was given Vtama samples but he did not think helpful) and Upper body skin exam (No hx of skin cancer). The patient presents for Upper Body Skin Exam (UBSE) for skin cancer screening and mole check.  The patient has spots, moles and lesions to be evaluated, some may be new or changing and the patient has concerns that these could be cancer.   The following portions of the chart were reviewed this encounter and updated as appropriate:   Tobacco  Allergies  Meds  Problems  Med Hx  Surg Hx  Fam Hx     Review of Systems:  No other skin or systemic complaints except as noted in HPI or Assessment and Plan.  Objective  Well appearing patient in no apparent distress; mood and affect are within normal limits.  All skin waist up examined.  L palm, bil ankles Small plaque L palm, L elbow, bil ankles   Assessment & Plan  Psoriasis L palm, bil ankles Counseling on psoriasis and coordination of care  psoriasis is a chronic non-curable, but treatable genetic/hereditary disease that may have other systemic features affecting other organ systems such as joints (Psoriatic Arthritis). It is associated with an increased risk of inflammatory bowel disease, heart disease, non-alcoholic fatty liver disease, and depression.  Treatments include light and laser treatments; topical medications; and systemic medications including oral and injectables.   Start Zoryve cr qd aa psoriasis prn flares, samples x 2 given, Lot MBBD 03/25  Roflumilast (ZORYVE) 0.3 % CREA - L palm, bil ankles Apply 1 Application topically as directed. Qd aa psoriasis on hands and ankles prn flares  Lentigines - Scattered tan macules - Due to sun exposure - Benign-appearing, observe - Recommend daily broad spectrum sunscreen SPF 30+ to sun-exposed areas, reapply  every 2 hours as needed. - Call for any changes - upper back  Seborrheic Keratoses - Stuck-on, waxy, tan-brown papules and/or plaques  - Benign-appearing - Discussed benign etiology and prognosis. - Observe - Call for any changes - arms  Melanocytic Nevi - Tan-brown and/or pink-flesh-colored symmetric macules and papules - Benign appearing on exam today - Observation - Call clinic for new or changing moles - Recommend daily use of broad spectrum spf 30+ sunscreen to sun-exposed areas.  - trunk  Hemangiomas - Red papules - Discussed benign nature - Observe - Call for any changes - trunk  Actinic Damage - Chronic condition, secondary to cumulative UV/sun exposure - diffuse scaly erythematous macules with underlying dyspigmentation - Recommend daily broad spectrum sunscreen SPF 30+ to sun-exposed areas, reapply every 2 hours as needed.  - Staying in the shade or wearing long sleeves, sun glasses (UVA+UVB protection) and wide brim hats (4-inch brim around the entire circumference of the hat) are also recommended for sun protection.  - Call for new or changing lesions.  Skin cancer screening performed today.   History of Basal Cell Carcinoma of the Skin - No evidence of recurrence today - Recommend regular full body skin exams - Recommend daily broad spectrum sunscreen SPF 30+ to sun-exposed areas, reapply every 2 hours as needed.  - Call if any new or changing lesions are noted between office visits  - L ear  Purpura - Chronic; persistent and recurrent.  Treatable, but not curable. - Violaceous macules and patches - Benign - Related to  trauma, age, sun damage and/or use of blood thinners, chronic use of topical and/or oral steroids - Observe - Can use OTC arnica containing moisturizer such as Dermend Bruise Formula if desired - Call for worsening or other concerns   Return in about 1 year (around 12/11/2023) for TBSE, Hx of BCC.  I, Othelia Pulling, RMA, am acting as  scribe for Sarina Ser, MD . Documentation: I have reviewed the above documentation for accuracy and completeness, and I agree with the above.  Sarina Ser, MD

## 2022-12-10 NOTE — Patient Instructions (Addendum)
Zoryve once daily to areas of psoriasis on hands, elbows, ankles until clear, then as needed flares    Due to recent changes in healthcare laws, you may see results of your pathology and/or laboratory studies on MyChart before the doctors have had a chance to review them. We understand that in some cases there may be results that are confusing or concerning to you. Please understand that not all results are received at the same time and often the doctors may need to interpret multiple results in order to provide you with the best plan of care or course of treatment. Therefore, we ask that you please give [place] 2 business days to thoroughly review all your results before contacting the office for clarification. Should we see a critical lab result, you will be contacted sooner.   If You Need Anything After Your Visit  If you have any questions or concerns for your doctor, please call our main line at [phone] and press option 4 to reach your doctor's medical assistant. If no one answers, please leave a voicemail as directed and we will return your call as soon as possible. Messages left after 4 pm will be answered the following business day.   You may also send [place] a message via [place]. We typically respond to MyChart messages within 1-2 business days.  For prescription refills, please ask your pharmacy to contact our office. Our fax number is [phone].  If you have an urgent issue when the clinic is closed that cannot wait until the next business day, you can page your doctor at the number below.    Please note that while we do our best to be available for urgent issues outside of office hours, we are not available 24/7.   If you have an urgent issue and are unable to reach [place], you may choose to seek medical care at your doctor's office, retail clinic, urgent care center, or emergency room.  If you have a medical emergency, please immediately call 911 or go to the emergency  department.  Pager Numbers  - Dr. Nehemiah Massed: [phone]  - Dr. Laurence Ferrari: [phone]  - Dr. Nicole Kindred: [phone]  In the event of inclement weather, please call our main line at [phone] for an update on the status of any delays or closures.  Dermatology Medication Tips: Please keep the boxes that topical medications come in in order to help keep track of the instructions about where and how to use these. Pharmacies typically print the medication instructions only on the boxes and not directly on the medication tubes.   If your medication is too expensive, please contact our office at [phone] option 4 or send [place] a message through [place].   We are unable to tell what your co-pay for medications will be in advance as this is different depending on your insurance coverage. However, we may be able to find a substitute medication at lower cost or fill out paperwork to get insurance to cover a needed medication.   If a prior authorization is required to get your medication covered by your insurance company, please allow [place] 1-2 business days to complete this process.  Drug prices often vary depending on where the prescription is filled and some pharmacies may offer cheaper prices.  The website www.goodrx.com contains coupons for medications through different pharmacies. The prices here do not account for what the cost may be with help from insurance (it may be cheaper with your insurance), but the website can give you the  give you the price if you did not use any insurance.  - You can print the associated coupon and take it with your prescription to the pharmacy.  - You may also stop by our office during regular business hours and pick up a GoodRx coupon card.  - If you need your prescription sent electronically to a different pharmacy, notify our office through [place] MyChart or by phone at [phone] option 4.     Si Usted Necesita Algo Despus de Su Visita  Tambin puede  enviarnos un mensaje a travs de MyChart. Por lo general respondemos a los mensajes de MyChart en el transcurso de 1 a 2 das hbiles.  Para renovar recetas, por favor pida a su farmacia que se ponga en contacto con nuestra oficina. Nuestro nmero de fax es el [phone].  Si tiene un asunto urgente cuando la clnica est cerrada y que no puede esperar hasta el siguiente da hbil, puede llamar/localizar a su doctor(a) al nmero que aparece a continuacin.   Por favor, tenga en cuenta que aunque hacemos todo lo posible para estar disponibles para asuntos urgentes fuera del horario de oficina, no estamos disponibles las 24 horas del da, los 7 das de la semana.   Si tiene un problema urgente y no puede comunicarse con nosotros, puede optar por buscar atencin mdica  en el consultorio de su doctor(a), en una clnica privada, en un centro de atencin urgente o en una sala de emergencias.  Si tiene una emergencia mdica, por favor llame inmediatamente al 911 o vaya a la sala de emergencias.  Nmeros de bper  - Dr. Kowalski: [phone]  - Dra. Moye: [phone]  - Dra. Stewart: [phone]  En caso de inclemencias del tiempo, por favor llame a nuestra lnea principal al [phone] para una actualizacin sobre el estado de cualquier retraso o cierre.  Consejos para la medicacin en dermatologa: Por favor, guarde las cajas en las que vienen los medicamentos de uso tpico para ayudarle a seguir las instrucciones sobre dnde y cmo usarlos. Las farmacias generalmente imprimen las instrucciones del medicamento slo en las cajas y no directamente en los tubos del medicamento.   Si su medicamento es muy caro, por favor, pngase en contacto con nuestra oficina llamando al [phone] y presione la opcin 4 o envenos un mensaje a travs de MyChart.   No podemos decirle cul ser su copago por los medicamentos por adelantado ya que esto es diferente dependiendo de la cobertura de su seguro.  Sin embargo, es posible que podamos encontrar un medicamento sustituto a menor costo o llenar un formulario para que el seguro cubra el medicamento que se considera necesario.   Si se requiere una autorizacin previa para que su compaa de seguros cubra su medicamento, por favor permtanos de 1 a 2 das hbiles para completar este proceso.  Los precios de los medicamentos varan con frecuencia dependiendo del lugar de dnde se surte la receta y alguna farmacias pueden ofrecer precios ms baratos.  El sitio web www.goodrx.com tiene cupones para medicamentos de diferentes farmacias. Los precios aqu no tienen en cuenta lo que podra costar con la ayuda del seguro (puede ser ms barato con su seguro), pero el sitio web puede darle el precio si no utiliz ningn seguro.  - Puede imprimir el cupn correspondiente y llevarlo con su receta a la farmacia.  - Tambin puede pasar por nuestra oficina durante el horario de atencin regular y recoger una tarjeta de cupones de GoodRx.  -   su receta se enve electrnicamente a una farmacia diferente, informe a nuestra oficina a travs de MyChart de [place] o por telfono llamando al [phone] y presione la opcin 4.

## 2022-12-22 ENCOUNTER — Encounter: Payer: Self-pay | Admitting: Dermatology

## 2023-01-22 ENCOUNTER — Emergency Department
Admission: EM | Admit: 2023-01-22 | Discharge: 2023-01-22 | Disposition: A | Discharge status: Home / Self Care | Payer: Medicare Other | Attending: Emergency Medicine | Admitting: Emergency Medicine

## 2023-01-22 DIAGNOSIS — E1165 Type 2 diabetes mellitus with hyperglycemia: Secondary | ICD-10-CM | POA: Diagnosis not present

## 2023-01-22 DIAGNOSIS — Z1152 Encounter for screening for COVID-19: Secondary | ICD-10-CM | POA: Diagnosis not present

## 2023-01-22 DIAGNOSIS — I10 Essential (primary) hypertension: Secondary | ICD-10-CM | POA: Diagnosis not present

## 2023-01-22 DIAGNOSIS — E860 Dehydration: Secondary | ICD-10-CM

## 2023-01-22 DIAGNOSIS — R739 Hyperglycemia, unspecified: Secondary | ICD-10-CM | POA: Diagnosis present

## 2023-01-22 LAB — URINALYSIS, ROUTINE W REFLEX MICROSCOPIC
Bacteria, UA: NONE SEEN
Bilirubin Urine: NEGATIVE
Glucose, UA: >=500 mg/dL — AB
Ketones, ur: NEGATIVE mg/dL
Leukocytes,Ua: NEGATIVE
Nitrite: NEGATIVE
Protein, ur: 100 mg/dL — AB
Specific Gravity, Urine: 1.032 — ABNORMAL HIGH (ref 1.005–1.030)
pH: 6 (ref 5.0–8.0)

## 2023-01-22 LAB — BASIC METABOLIC PANEL
Anion gap: 6 (ref 5–15)
BUN: 20 mg/dL (ref 8–23)
CO2: 20 mmol/L — ABNORMAL LOW (ref 22–32)
Calcium: 9.6 mg/dL (ref 8.9–10.3)
Chloride: 115 mmol/L — ABNORMAL HIGH (ref 98–111)
Creatinine, Ser: 1 mg/dL (ref 0.61–1.24)
GFR, Estimated: >60 mL/min (ref >60)
Glucose, Bld: 223 mg/dL — ABNORMAL HIGH (ref 70–99)
Potassium: 4 mmol/L (ref 3.5–5.1)
Sodium: 141 mmol/L (ref 135–145)

## 2023-01-22 LAB — CBC
HCT: 49.7 % (ref 39.0–52.0)
Hemoglobin: 17.2 g/dL — ABNORMAL HIGH (ref 13.0–17.0)
MCH: 35.3 pg — ABNORMAL HIGH (ref 26.0–34.0)
MCHC: 34.6 g/dL (ref 30.0–36.0)
MCV: 102.1 fL — ABNORMAL HIGH (ref 80.0–100.0)
Platelets: 101 10*3/uL — ABNORMAL LOW (ref 150–400)
RBC: 4.87 MIL/uL (ref 4.22–5.81)
RDW: 15.7 % — ABNORMAL HIGH (ref 11.5–15.5)
WBC: 10.1 10*3/uL (ref 4.0–10.5)
nRBC: 0 % (ref 0.0–0.2)

## 2023-01-22 LAB — TROPONIN I (HIGH SENSITIVITY)
Troponin I (High Sensitivity): 34 ng/L — ABNORMAL HIGH (ref <18)
Troponin I (High Sensitivity): 37 ng/L — ABNORMAL HIGH (ref <18)

## 2023-01-22 LAB — RESP PANEL BY RT-PCR (RSV, FLU A&B, COVID)  RVPGX2
Influenza A by PCR: NEGATIVE
Influenza B by PCR: NEGATIVE
Resp Syncytial Virus by PCR: NEGATIVE
SARS Coronavirus 2 by RT PCR: NEGATIVE

## 2023-01-22 LAB — CBG MONITORING, ED: Glucose-Capillary: 244 mg/dL — ABNORMAL HIGH (ref 70–99)

## 2023-01-22 MED ORDER — BLOOD GLUCOSE MONITORING SUPPL DEVI: 1.0000 | Freq: Three times a day (TID) | 0 refills | Status: AC

## 2023-01-22 MED ORDER — LANCETS MISC. MISC: 1.0000 | Freq: Three times a day (TID) | 0 refills | Status: AC

## 2023-01-22 MED ORDER — LANCET DEVICE MISC: 1.0000 | Freq: Three times a day (TID) | 0 refills | Status: AC

## 2023-01-22 MED ORDER — SODIUM CHLORIDE 0.9 % IV BOLUS
1000.0000 mL | Freq: Once | INTRAVENOUS | Status: AC
  Administered 2023-01-22: 1000 mL via INTRAVENOUS

## 2023-01-22 MED ORDER — FREESTYLE LIBRE 3 READER DEVI: 1.0000 | Freq: Once | 0 refills | Status: AC

## 2023-01-22 MED ORDER — DEXCOM G7 SENSOR MISC: 1.0000 | Freq: Once | 0 refills | Status: AC

## 2023-01-22 MED ORDER — BLOOD GLUCOSE TEST VI STRP: 1.0000 | ORAL_STRIP | Freq: Three times a day (TID) | 0 refills | Status: AC

## 2023-01-22 MED ORDER — LACTATED RINGERS IV BOLUS
1000.0000 mL | Freq: Once | INTRAVENOUS | Status: AC
  Administered 2023-01-22: 1000 mL via INTRAVENOUS

## 2023-01-22 NOTE — ED Triage Notes (Signed)
First nurse note: Pt to ED via [place] from [facility]. Pt reports possible hyperglycemia. CBG at home 388. Pt reports fatigue, dizziness and blurry vision x1 day. VSS.

## 2023-01-22 NOTE — ED Triage Notes (Addendum)
Pt here with possible hyperglycemia. Pt states he feels tired and "foggy". Pt also c/o weakness. Pt states he had cardiac stents placed last summer.

## 2023-01-22 NOTE — ED Provider Notes (Signed)
[facility] Provider Note    Event Date/Time   First MD Initiated Contact with Patient 01/22/23 1441     (approximate)   History   Chief Complaint: Hyperglycemia   HPI  Nicholas Abbott is a 72 y.o. male with a history of hypertension hyperlipidemia diabetes GERD who comes ED complaining of hyperglycemia.  Also feeling fatigued and somewhat foggy today.  Notes that he has had trouble with his glucose monitor because he recently got a new freestyle libre 3 sensor, but it is not compatible with his iPhone 15, and so he has therefore been unable to monitor his blood sugar at home.  He went to [facility] today and was found to have a blood sugar of almost 400 and sent to the ED for evaluation.  He denies any pain.  Normal oral intake and fluids.  No fever, no vomiting or diarrhea.  No falls or trauma.     Physical Exam   Triage Vital Signs: ED Triage Vitals [01/22/23 1315]  Enc Vitals Group     BP (!) 172/64     Pulse Rate (!) 41     Resp 18     Temp 97.9 F (36.6 C)     Temp Source Oral     SpO2 99 %     Weight 272 lb 14.9 oz (123.8 kg)     Height 6' 1.75" (1.873 m)     Head Circumference      Peak Flow      Pain Score 0     Pain Loc      Pain Edu?      Excl. in [place]?     Most recent vital signs: Vitals:   01/22/23 1315 01/22/23 1618  BP: (!) 172/64 (!) 152/70  Pulse: (!) 41 66  Resp: 18 18  Temp: 97.9 F (36.6 C) 97.6 F (36.4 C)  SpO2: 99% 100%    General: Awake, no distress.  CV:  Good peripheral perfusion.  Regular rate and rhythm Resp:  Normal effort.  Clear to auscultation bilaterally Abd:  No distention.  Soft nontender Other:  Somewhat dry mucous membranes.  Lucid, linear thought process and able to carry on a fluent, coherent conversation   ED Results / Procedures / Treatments   Labs (all labs ordered are listed, but only abnormal results are displayed) Labs Reviewed  BASIC METABOLIC PANEL - Abnormal; Notable for the  following components:      Result Value   Chloride 115 (*)    CO2 20 (*)    Glucose, Bld 223 (*)    All other components within normal limits  CBC - Abnormal; Notable for the following components:   Hemoglobin 17.2 (*)    MCV 102.1 (*)    MCH 35.3 (*)    RDW 15.7 (*)    Platelets 101 (*)    All other components within normal limits  URINALYSIS, ROUTINE W REFLEX MICROSCOPIC - Abnormal; Notable for the following components:   Color, Urine YELLOW (*)    APPearance CLEAR (*)    Specific Gravity, Urine 1.032 (*)    Glucose, UA >=500 (*)    Hgb urine dipstick MODERATE (*)    Protein, ur 100 (*)    All other components within normal limits  CBG MONITORING, ED - Abnormal; Notable for the following components:   Glucose-Capillary 244 (*)    All other components within normal limits  TROPONIN I (HIGH SENSITIVITY) - Abnormal; Notable for  the following components:   Troponin I (High Sensitivity) 34 (*)    All other components within normal limits  TROPONIN I (HIGH SENSITIVITY) - Abnormal; Notable for the following components:   Troponin I (High Sensitivity) 37 (*)    All other components within normal limits  RESP PANEL BY RT-PCR (RSV, FLU A&B, COVID)  [id number]     EKG EKG obtained but with excessive artifact, not able to be interpreted in detail.  No obvious ischemic changes.  Underlying sinus rhythm.   RADIOLOGY    PROCEDURES:  Procedures   MEDICATIONS ORDERED IN ED: Medications  sodium chloride 0.9 % bolus 1,000 mL (0 mLs Intravenous Stopped 01/22/23 1550)  lactated ringers bolus 1,000 mL (0 mLs Intravenous Stopped 01/22/23 1712)     IMPRESSION / MDM / ASSESSMENT AND PLAN / ED COURSE  I reviewed the triage vital signs and the nursing notes.  DDx: Hyperglycemia, dehydration, viral illness/COVID/flu, UTI, electrolyte abnormality, non-STEMI  Patient's presentation is most consistent with acute presentation with potential threat to life or bodily function.  Patient  presents with hyperglycemia, fatigue today.  No specific chest pain or shortness of breath or other focal complaints.  This appears to be related to being unable to check his blood sugar because of incompatibility between his smart phone and his freestyle libre continuous glucose monitor.  Labs are all reassuring, they do show signs of dehydration.  Patient was given 2 L of IV fluids and feels much better, mental status is normal.  Serial troponins are flat.  Vital signs reassuring.  He is nontoxic in appearance.  He is departing for a weeklong cruise in a few days.  I have sent prescriptions to his pharmacy for a freestyle libre reader device, if this is unable to be covered, sent a prescription for a Dexcom sensor which is compatible with iPhone 15.  If he is not able to obtain that, he will fill a prescription for a fingerstick glucometer kit and revert to using that instead.       FINAL CLINICAL IMPRESSION(S) / ED DIAGNOSES   Final diagnoses:  Type 2 diabetes mellitus with hyperglycemia, without long-term current use of insulin (HCC)  Dehydration     Rx / DC Orders   ED Discharge Orders          Ordered    Continuous Blood Gluc Receiver (FREESTYLE LIBRE 3 READER) DEVI   Once        01/22/23 1655    Continuous Blood Gluc Sensor (DEXCOM G7 SENSOR) MISC   Once        01/22/23 1655    Blood Glucose Monitoring Suppl DEVI  3 times daily        01/22/23 1655    Glucose Blood (BLOOD GLUCOSE TEST STRIPS) STRP  3 times daily        01/22/23 1655    Lancet Device MISC  3 times daily        01/22/23 1655    Lancets Misc. MISC  3 times daily        01/22/23 1655             Note:  This document was prepared using Dragon voice recognition software and may include unintentional dictation errors.   Carrie Mew, MD 01/22/23 [phone]

## 2023-01-27 ENCOUNTER — Other Ambulatory Visit: Payer: Self-pay | Admitting: Dermatology

## 2023-01-27 DIAGNOSIS — B356 Tinea cruris: Secondary | ICD-10-CM

## 2023-04-06 ENCOUNTER — Telehealth: Payer: Self-pay | Admitting: Oncology

## 2023-04-06 NOTE — Telephone Encounter (Signed)
Patient left a vm stating he received labs on Monday 6/3. Labs are available for view in care everywhere. Pt is requesting to cancel orders for any duplicates. He is scheduled for labs here this Friday 6/7.

## 2023-04-09 ENCOUNTER — Inpatient Hospital Stay: Payer: Medicare Other | Attending: Oncology

## 2023-04-09 DIAGNOSIS — D696 Thrombocytopenia, unspecified: Secondary | ICD-10-CM

## 2023-04-13 LAB — COMP PANEL: LEUKEMIA/LYMPHOMA: Immunophenotypic Profile: 24

## 2023-04-16 ENCOUNTER — Encounter: Payer: Self-pay | Admitting: Oncology

## 2023-04-16 ENCOUNTER — Inpatient Hospital Stay (HOSPITAL_BASED_OUTPATIENT_CLINIC_OR_DEPARTMENT_OTHER): Payer: Medicare Other | Admitting: Oncology

## 2023-04-16 DIAGNOSIS — D696 Thrombocytopenia, unspecified: Secondary | ICD-10-CM

## 2023-04-16 NOTE — Progress Notes (Signed)
[facility]  [phone] [phone]  ID: Vandon Buchmann OB: [birth year]  MR#: [id number]  WNU#:[id number]  Patient Care Team: Enid Baas, MD as PCP - General (Internal Medicine)  I connected with Darol Destine on 04/16/23 at 11:15 AM EDT by video enabled telemedicine visit and verified that I am speaking with the correct person using two identifiers.   I discussed the limitations, risks, security and privacy concerns of performing an evaluation and management service by telemedicine and the availability of in-person appointments. I also discussed with the patient that there may be a patient responsible charge related to this service. The patient expressed understanding and agreed to proceed.   Other persons participating in the visit and their role in the encounter: Patient, MD.  Patient's location: Home. Provider's location: Home.  CHIEF COMPLAINT: Thrombocytopenia, clonality noted on flow cytometry.  INTERVAL HISTORY: Patient agreed to video assisted telemedicine visit for routine evaluation and discussion of his laboratory results.  He continues to feel well and remains asymptomatic. He has no neurologic complaints.  He denies any recent fevers or illnesses.  He has a good appetite and denies weight loss.  He has no chest pain, shortness of breath, cough, or hemoptysis.  He denies any nausea, vomiting, constipation, or diarrhea.  He has no urinary complaints.  Patient feels at his baseline and offers no specific complaints today.  REVIEW OF SYSTEMS:   Review of Systems  Constitutional: Negative.  Negative for fever, malaise/fatigue and weight loss.  Respiratory: Negative.  Negative for cough, hemoptysis and shortness of breath.   Cardiovascular: Negative.  Negative for chest pain and leg swelling.  Gastrointestinal: Negative.  Negative for abdominal pain.  Genitourinary: Negative.  Negative for dysuria.  Musculoskeletal:  Negative for  back pain.  Skin: Negative.  Negative for rash.  Neurological: Negative.  Negative for dizziness, focal weakness, weakness and headaches.  Endo/Heme/Allergies:  Does not bruise/bleed easily.  Psychiatric/Behavioral: Negative.  The patient is not nervous/anxious.     As per HPI. Otherwise, a complete review of systems is negative.  PAST MEDICAL HISTORY: Past Medical History:  Diagnosis Date   Basal cell carcinoma    L ear, txted in past   Diabetes mellitus without complication (HCC)    GERD (gastroesophageal reflux disease)    Hyperlipidemia    Hypertension    Left shoulder pain    Myocardial infarction [facility])     PAST SURGICAL HISTORY: Past Surgical History:  Procedure Laterality Date   CATARACT EXTRACTION     COLONOSCOPY WITH PROPOFOL N/A 01/01/2020   Procedure: COLONOSCOPY WITH PROPOFOL;  Surgeon: [name], Boykin Nearing, MD;  Location: [facility] ENDOSCOPY;  Service: Gastroenterology;  Laterality: N/A;   CORONARY ANGIOPLASTY     ESOPHAGOGASTRODUODENOSCOPY (EGD) WITH PROPOFOL N/A 01/01/2020   Procedure: ESOPHAGOGASTRODUODENOSCOPY (EGD) WITH PROPOFOL;  Surgeon: [name], Boykin Nearing, MD;  Location: [facility] ENDOSCOPY;  Service: Gastroenterology;  Laterality: N/A;   EYE SURGERY     LEFT HEART CATH AND CORONARY ANGIOGRAPHY N/A 04/29/2022   Procedure: LEFT HEART CATH AND CORONARY ANGIOGRAPHY;  Surgeon: Armando Reichert, MD;  Location: [facility] INVASIVE CV LAB;  Service: Cardiovascular;  Laterality: N/A;   RETINAL DETACHMENT SURGERY      FAMILY HISTORY: Family History  Problem Relation Age of Onset   Leukemia Father     ADVANCED DIRECTIVES (Y/N):  N  HEALTH MAINTENANCE: Social History   Tobacco Use   Smoking status: Never   Smokeless tobacco: Never  Vaping Use   Vaping  Use: Never used  Substance Use Topics   Alcohol use: Yes    Alcohol/week: 5.0 standard drinks of alcohol    Types: 5 Shots of liquor per week   Drug use: Never     Colonoscopy:  PAP:  Bone density:  Lipid  panel:  Allergies  Allergen Reactions   Pioglitazone Rash    Current Outpatient Medications  Medication Sig Dispense Refill   aspirin EC 81 MG tablet Take 81 mg by mouth daily.     Blood Glucose Monitoring Suppl DEVI 1 each by Does not apply route in the morning, at noon, and at bedtime. May substitute to any manufacturer covered by patient's insurance. 1 each 0   Cinnamon 500 MG capsule Take 1,000 mg by mouth daily.     clopidogrel (PLAVIX) 75 MG tablet Take 1 tablet (75 mg total) by mouth daily. 30 tablet 11   Cyanocobalamin (VITAMIN B-12) 5000 MCG TBDP Take 5,000 mcg by mouth daily.      empagliflozin (JARDIANCE) 10 MG TABS tablet Take 1 tablet by mouth daily with breakfast.     folic acid-pyridoxine-cyancobalamin (FOLTX) 2.5-25-2 MG TABS tablet Take 1 tablet by mouth daily.     hydrocortisone 2.5 % cream APPLY TO THE GROIN DAILY ON MONDAY, WEDNESDAY, AND FRIDAY. 28 g 1   levothyroxine (SYNTHROID) 112 MCG tablet Take 112 mcg by mouth daily.     lisinopril-hydrochlorothiazide (ZESTORETIC) 20-12.5 MG tablet Take 1 tablet by mouth daily.     metoprolol succinate (TOPROL-XL) 25 MG 24 hr tablet Take 0.5 tablets (12.5 mg total) by mouth daily. 15 tablet 11   nitroGLYCERIN (NITROSTAT) 0.4 MG SL tablet Place 0.4 mg under the tongue every 5 (five) minutes as needed for chest pain.     PHOSPHATIDYLSERINE PO Take by mouth.     tadalafil (CIALIS) 5 MG tablet Take 5 mg by mouth daily as needed for erectile dysfunction.     traMADol (ULTRAM) 50 MG tablet Take 50 mg by mouth daily as needed for severe pain or moderate pain.     ramipril (ALTACE) 2.5 MG capsule Take 2.5 mg by mouth daily.     Roflumilast (ZORYVE) 0.3 % CREA Apply 1 Application topically as directed. Qd aa psoriasis on hands and ankles prn flares 60 g 11   rosuvastatin (CRESTOR) 40 MG tablet Take by mouth. (Patient not taking: Reported on 04/16/2023)     No current facility-administered medications for this visit.     OBJECTIVE: There were no vitals filed for this visit.    There is no height or weight on file to calculate BMI.    ECOG FS:0 - Asymptomatic  General: Well-developed, well-nourished, no acute distress. HEENT: Normocephalic. Neuro: Alert, answering all questions appropriately. Cranial nerves grossly intact. Psych: Normal affect.  LAB RESULTS:  Lab Results  Component Value Date   NA 141 01/22/2023   K 4.0 01/22/2023   CL 115 (H) 01/22/2023   CO2 20 (L) 01/22/2023   GLUCOSE 223 (H) 01/22/2023   BUN 20 01/22/2023   CREATININE 1.00 01/22/2023   CALCIUM 9.6 01/22/2023   PROT 6.3 (L) 01/28/2020   ALBUMIN 3.2 (L) 01/28/2020   AST 67 (H) 01/28/2020   ALT 63 (H) 01/28/2020   ALKPHOS 66 01/28/2020   BILITOT 2.7 (H) 01/28/2020   GFRNONAA >60 01/22/2023   GFRAA >60 01/31/2020    Lab Results  Component Value Date   WBC 10.1 01/22/2023   NEUTROABS 6.5 01/28/2020   HGB 17.2 (H) 01/22/2023  HCT 49.7 01/22/2023   MCV 102.1 (H) 01/22/2023   PLT 101 (L) 01/22/2023   Lab Results  Component Value Date   IRON 109 09/10/2022   TIBC 242 (L) 09/10/2022   IRONPCTSAT 45 (H) 09/10/2022   Lab Results  Component Value Date   FERRITIN 301 09/10/2022     STUDIES: No results found.  ASSESSMENT: Thrombocytopenia.  PLAN:    Thrombocytopenia: Upon review of patient's chart, he was noted to have a decreased platelet count ranging from 80-104 since December 2020.  His most recent count is 87.  Previously, abdominal ultrasound on February 17, 2022 revealed a mildly enlarged spleen of 13 cm which may be contributing.  Platelet antibody profile, SPEP, iron stores, B12, and folate are negative or within normal limits.  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  No intervention is needed.  Patient does not require bone marrow biopsy.  Return to clinic in 1 year with repeat laboratory work and video-assisted telemedicine  visit. Macrocytosis: Chronic and unchanged.  Patient noted to have an MCV of 103.3.  B12 and folate are within normal limits.  Monitor. Clonal B-cell population: Chronic and unchanged.  The clinical significance of this is unclear.  Repeat flow cytometry revealed a clonal population of lymphocytes of 24%.  Previously this was noted to be 27% of lymphocytes.  The CD103+, CD11+, CD25-, CD5- and CD10- population is not typical for CLL, mantle cell, or hairy cell leukemia.  The CD103 phenotype can be partially expressed and an atypical hairy cell leukemia.  No intervention is needed.  Patient does not require bone marrow biopsy.  Follow-up as above.  I provided 30 minutes of face-to-face video visit time during this encounter which included chart review, counseling, and coordination of care as documented above.   Patient expressed understanding and was in agreement with this plan. He also understands that He can call clinic at any time with any questions, concerns, or complaints.    Jeralyn Ruths, MD   04/16/2023 11:52 AM

## 2023-04-23 NOTE — Progress Notes (Unsigned)
[facility] [address] [place], [place] [address]  Pulmonary Sleep Medicine   Office Visit Note  Patient Name: Nicholas Abbott DOB: [birth year] MRN [id number]    Chief Complaint: Obstructive Sleep Apnea visit  Brief History:  Nicholas Abbott is seen today for an annual follow up visit for CPAP@ 10 cmH2O. The patient has a 6 year history of sleep apnea. Patient is using PAP nightly.  The patient feels rested after sleeping with PAP.  The patient reports benefiting from PAP use. Reported sleepiness is  improved and the Epworth Sleepiness Score is 1 out of 24. The patient will occasionally take naps. The patient complains of the following: patient is in need of a replacement unit.  The compliance download shows 99% compliance with an average use time of 8 hours 40 minutes. The AHI is 0.7.  The patient does not complain of limb movements disrupting sleep. The patient continues to require PAP therapy in order to eliminate sleep apnea. Machine is at end of life and must be replaced.   ROS  General: (-) fever, (-) chills, (-) night sweat Nose and Sinuses: (-) nasal stuffiness or itchiness, (-) postnasal drip, (-) nosebleeds, (-) sinus trouble. Mouth and Throat: (-) sore throat, (-) hoarseness. Neck: (-) swollen glands, (-) enlarged thyroid, (-) neck pain. Respiratory: - cough, - shortness of breath, - wheezing. Neurologic: - numbness, - tingling. Psychiatric: - anxiety, - depression   Current Medication: Outpatient Encounter Medications as of 04/26/2023  Medication Sig   aspirin EC 81 MG tablet Take 81 mg by mouth daily.   Blood Glucose Monitoring Suppl DEVI 1 each by Does not apply route in the morning, at noon, and at bedtime. May substitute to any manufacturer covered by patient's insurance.   Cinnamon 500 MG capsule Take 1,000 mg by mouth daily.   clopidogrel (PLAVIX) 75 MG tablet Take 1 tablet (75 mg total) by mouth daily.   Cyanocobalamin (VITAMIN B-12) 5000 MCG TBDP Take  5,000 mcg by mouth daily.    empagliflozin (JARDIANCE) 10 MG TABS tablet Take 1 tablet by mouth daily with breakfast.   folic acid-pyridoxine-cyancobalamin (FOLTX) 2.5-25-2 MG TABS tablet Take 1 tablet by mouth daily.   hydrocortisone 2.5 % cream APPLY TO THE GROIN DAILY ON MONDAY, WEDNESDAY, AND FRIDAY.   levothyroxine (SYNTHROID) 112 MCG tablet Take 112 mcg by mouth daily.   lisinopril-hydrochlorothiazide (ZESTORETIC) 20-12.5 MG tablet Take 1 tablet by mouth daily.   metoprolol succinate (TOPROL-XL) 25 MG 24 hr tablet Take 0.5 tablets (12.5 mg total) by mouth daily.   nitroGLYCERIN (NITROSTAT) 0.4 MG SL tablet Place 0.4 mg under the tongue every 5 (five) minutes as needed for chest pain.   PHOSPHATIDYLSERINE PO Take by mouth.   rosuvastatin (CRESTOR) 40 MG tablet Take by mouth. (Patient not taking: Reported on 04/16/2023)   tadalafil (CIALIS) 5 MG tablet Take 5 mg by mouth daily as needed for erectile dysfunction.   traMADol (ULTRAM) 50 MG tablet Take 50 mg by mouth daily as needed for severe pain or moderate pain.   [DISCONTINUED] ramipril (ALTACE) 2.5 MG capsule Take 2.5 mg by mouth daily.   [DISCONTINUED] Roflumilast (ZORYVE) 0.3 % CREA Apply 1 Application topically as directed. Qd aa psoriasis on hands and ankles prn flares   No facility-administered encounter medications on file as of 04/26/2023.    Surgical History: Past Surgical History:  Procedure Laterality Date   CATARACT EXTRACTION     COLONOSCOPY WITH PROPOFOL N/A 01/01/2020   Procedure: COLONOSCOPY WITH PROPOFOL;  Surgeon: [name], Boykin Nearing, MD;  Location: [facility] ENDOSCOPY;  Service: Gastroenterology;  Laterality: N/A;   CORONARY ANGIOPLASTY     ESOPHAGOGASTRODUODENOSCOPY (EGD) WITH PROPOFOL N/A 01/01/2020   Procedure: ESOPHAGOGASTRODUODENOSCOPY (EGD) WITH PROPOFOL;  Surgeon: [name], Boykin Nearing, MD;  Location: [facility] ENDOSCOPY;  Service: Gastroenterology;  Laterality: N/A;   EYE SURGERY     LEFT HEART CATH AND CORONARY ANGIOGRAPHY N/A  04/29/2022   Procedure: LEFT HEART CATH AND CORONARY ANGIOGRAPHY;  Surgeon: Armando Reichert, MD;  Location: [facility] INVASIVE CV LAB;  Service: Cardiovascular;  Laterality: N/A;   RETINAL DETACHMENT SURGERY      Medical History: Past Medical History:  Diagnosis Date   Basal cell carcinoma    L ear, txted in past   Diabetes mellitus without complication (HCC)    GERD (gastroesophageal reflux disease)    Hyperlipidemia    Hypertension    Left shoulder pain    Myocardial infarction (HCC)     Family History: Non contributory to the present illness  Social History: Social History   Socioeconomic History   Marital status: Married    Spouse name: Not on file   Number of children: Not on file   Years of education: Not on file   Highest education level: Not on file  Occupational History   Not on file  Tobacco Use   Smoking status: Never   Smokeless tobacco: Never  Vaping Use   Vaping Use: Never used  Substance and Sexual Activity   Alcohol use: Yes    Alcohol/week: 5.0 standard drinks of alcohol    Types: 5 Shots of liquor per week   Drug use: Never   Sexual activity: Yes    Birth control/protection: Post-menopausal  Other Topics Concern   Not on file  Social History Narrative   Not on file   Social Determinants of Health   Financial Resource Strain: Not on file  Food Insecurity: Not on file  Transportation Needs: Not on file  Physical Activity: Not on file  Stress: Not on file  Social Connections: Not on file  Intimate Partner Violence: Not on file    Vital Signs: Blood pressure 135/74, pulse 71, resp. rate 16, height 6\' 2"  (1.88 m), SpO2 97 %. Body mass index is 35.04 kg/m.    Examination: General Appearance: The patient is well-developed, well-nourished, and in no distress. Neck Circumference: 46 cm Skin: Gross inspection of skin unremarkable. Head: normocephalic, no gross deformities. Eyes: no gross deformities noted. ENT: ears appear grossly  normal Neurologic: Alert and oriented. No involuntary movements.  STOP BANG RISK ASSESSMENT S (snore) Have you been told that you snore?     NO   T (tired) Are you often tired, fatigued, or sleepy during the day?   YES  O (obstruction) Do you stop breathing, choke, or gasp during sleep? NO   P (pressure) Do you have or are you being treated for high blood pressure? YES   B (BMI) Is your body index greater than 35 kg/m? NO   A (age) Are you 22 years old or older? YES   N (neck) Do you have a neck circumference greater than 16 inches?   YES   G (gender) Are you a male? YES   TOTAL STOP/BANG "YES" ANSWERS 5       A STOP-Bang score of 2 or less is considered low risk, and a score of 5 or more is high risk for having either moderate or severe OSA. For people who score 3  or 4, doctors may need to perform further assessment to determine how likely they are to have OSA.         EPWORTH SLEEPINESS SCALE:  Scale:  (0)= no chance of dozing; (1)= slight chance of dozing; (2)= moderate chance of dozing; (3)= high chance of dozing  Chance  Situtation    Sitting and reading: 0    Watching TV: 0    Sitting Inactive in public: 0    As a passenger in car: 0      Lying down to rest: 1    Sitting and talking: 0    Sitting quielty after lunch: 0    In a car, stopped in traffic: 0   TOTAL SCORE:   1 out of 24    SLEEP STUDIES:  PSG (03/29/17) AHI 73, min SPO2 82% Titration (04/2018) CPAP@ 9 cmH2O   CPAP COMPLIANCE DATA:  Date Range: 04/22/2022-04/21/2023  Average Daily Use: 8 hours 40 minutes  Median Use: 8 hours 45 minutes  Compliance for > 4 Hours: 99%  AHI: 0.7 respiratory events per hour  Days Used: 361/365 days  Mask Leak: 20.4  95th Percentile Pressure: 10         LABS: Recent Results (from the past 2160 hour(s))  Flow cytometry panel-leukemia/lymphoma work-up     Status: None   Collection Time: 04/09/23 11:22 AM  Result Value Ref Range   PATH  INTERP XXX-IMP Comment     Comment: (NOTE) 1. A CD103+, CD11c+, CD25-, CD5- and CD10- monoclonal B cell population detected, representing 24% of leukocytes, see comment. 2. Mild eosinophilia, see comment.    ANNOTATION COMMENT IMP Comment     Comment: (NOTE) 1. A typical phenotype for chronic lymphocytic leukemia/small lymphocytic lymphoma, mantle cell lymphoma, hairy cell leukemia, or follicular center cell lymphoma is not detected. The phenotype detected is not typical for hairy cell leukemia due to the absence of CD25 expression, but hairy cell leukemia variant (splenic B cell lymphoma/leukemia with prominent nucleoli, WHO 5th edition) remains in the differential diagnosis.  CD103 can be partially expressed in atypical hairy cell leukemia and splenic marginal zone lymphomas. Correlation with clinical history, cytogenetics, BRAF testing, and bone marrow evaluation is recommended, if clinically indicated. 2. Eosinophilia may be associated with drug reaction, parasitic infection, atopic disorders, and neoplastic processes, among others. Clinical correlation is recommended. Previous peripheral blood flow cytometry result dated 09/10/22 has been reviewed and showed similar findings.    CLINICAL INFO Comment     Comment: (NOTE) Accompanying CBC dated 04/09/2023 shows: WBC count 8.4.    Specimen Type Comment     Comment: Peripheral blood   ASSESSMENT OF LEUKOCYTES Comment     Comment: (NOTE) A CD103+, CD11c+, CD25-, CD5- and CD10- monoclonal B cell population is detected with lambda light chain restriction, representing 24% of leukocytes and 94% of B cells. There is no loss of, or aberrant expression of, the pan T cell antigens to suggest a neoplastic T cell process. CD4:CD8 ratio 2.8. No circulating blasts are detected. There is no immunophenotypic  evidence of abnormal myeloid maturation. Analysis of the leukocyte population shows: granulocytes  45%, (including eosinophils 7%),  monocytes 8%, lymphocytes 47%, blasts <0.1%, B cells 25%, T cells 13%, NK cells 9%.    % Viable Cells Comment     Comment: 90%   Immunophenotypic Profile 24% of total cells (Phenotype below)     Comment: Comment Abnormal cell population: present    ANALYSIS AND GATING STRATEGY Comment  Comment: (NOTE) 8 color analysis with CD45/SSC gating ZEBNC    IMMUNOPHENOTYPING STUDY Comment     Comment: (NOTE) CD2       (-)            CD3       (-) CD4       (-)            CD5       (-) CD7       (-)            CD8       (-) CD10      (-)            CD11b     (-) CD11c     (+) Bright     CD13      (-) CD14      (-)            CD15      (-) CD16      (-)            CD19      (+) Moderate CD20      (+) Bright     CD22      (+) Bright CD23      (-)            CD25      (-) CD33      (-)            CD34      (-) CD38      (-)            CD45      (+) CD56      (-)            CD57      (-) CD103     (+)            CD117     (-) FMC-7     (+)            HLA-DR    (+) KAPPA     (-)            LAMBDA    (+) CD64      (-)    PATHOLOGIST NAME Comment     Comment: Sumner Boast, M.D.   COMMENT: Comment     Comment: (NOTE) Each antibody in this assay was utilized to assess for potential abnormalities of studied cell populations or to characterize identified abnormalities. This test was developed and its performance characteristics determined by [facility].  It has not been cleared or approved by the U.S. Food and Drug Administration. The FDA has determined that such clearance or approval is not necessary. This test is used for clinical purposes.  It should not be regarded as investigational or for research. Performed At: [facility] [address] [place], [place] [id number] Maurine Simmering MDPhD ZH:[id number] Performed At: [facility] [address] [place], [place] [id number] Maurine Simmering MDPhD [id number] Performed At: [facility] [place] [address] [place], [place] [id number] Maurine Simmering MD ZD:[id number]     Radiology: No results found.  No results found.  No results found.    Assessment and Plan: Patient Active Problem List   Diagnosis Date Noted   S/P angioplasty with stent 05/01/2022   GERD (gastroesophageal reflux disease) 04/28/2021   Sleep apnea 04/28/2021   Hyperlipidemia 04/28/2021   Hyperlipidemia  Hypertension    Diabetes mellitus without complication (HCC)    CAD (coronary artery disease)    Thrombocytopenia (HCC)    Acute metabolic encephalopathy    Hypoglycemia    Type 2 diabetes mellitus without complication, without long-term current use of insulin (HCC) 02/11/2017   Combined arterial insufficiency and corporo-venous occlusive erectile dysfunction 02/10/2017   Coronary artery disease 06/06/2014   Hypertension 06/06/2014   Obesity 05/30/2012   Myocardial infarction (HCC) 03/02/2000   1. OSA on CPAP The patient does tolerate PAP and reports  benefit from PAP use. The patient was reminded how to clean equipment and advised to replace supplies routinely. The patient was also counselled on weight loss. The compliance is excellent. The AHI is 0.7.   OSA on cpap- controlled. Replace machine. Continue with excellent compliance with pap. CPAP continues to be medically necessary to treat this patient's OSA. F/u 30d after set up   2. CPAP use counseling CPAP Counseling: had a lengthy discussion with the patient regarding the importance of PAP therapy in management of the sleep apnea. Patient appears to understand the risk factor reduction and also understands the risks associated with untreated sleep apnea. Patient will try to make a good faith effort to remain compliant with therapy. Also instructed the patient on proper cleaning of the device including the water must be changed daily if possible and use of distilled water is preferred. Patient understands that the machine should be regularly cleaned  with appropriate recommended cleaning solutions that do not damage the PAP machine for example given white vinegar and water rinses. Other methods such as ozone treatment may not be as good as these simple methods to achieve cleaning.   3. Hypertension, unspecified type Hypertension Counseling:   The following hypertensive lifestyle modification were recommended and discussed:  1. Limiting alcohol intake to less than 1 oz/day of ethanol:(24 oz of beer or 8 oz of wine or 2 oz of 100-proof whiskey). 2. Take baby ASA 81 mg daily. 3. Importance of regular aerobic exercise and losing weight. 4. Reduce dietary saturated fat and cholesterol intake for overall cardiovascular health. 5. Maintaining adequate dietary potassium, calcium, and magnesium intake. 6. Regular monitoring of the blood pressure. 7. Reduce sodium intake to less than 100 mmol/day (less than 2.3 gm of sodium or less than 6 gm of sodium choride)      General Counseling: I have discussed the findings of the evaluation and examination with Lollie Sails.  I have also discussed any further diagnostic evaluation thatmay be needed or ordered today. Qualyn verbalizes understanding of the findings of todays visit. We also reviewed his medications today and discussed drug interactions and side effects including but not limited excessive drowsiness and altered mental states. We also discussed that there is always a risk not just to him but also people around him. he has been encouraged to call the office with any questions or concerns that should arise related to todays visit.  No orders of the defined types were placed in this encounter.       I have personally obtained a history, examined the patient, evaluated laboratory and imaging results, formulated the assessment and plan and placed orders. This patient was seen today by Emmaline Kluver, PA-C in collaboration with Dr. Freda Munro.   Yevonne Pax, MD [facility] Diplomate ABMS Pulmonary Critical Care  Medicine and Sleep Medicine

## 2023-04-26 ENCOUNTER — Ambulatory Visit (INDEPENDENT_AMBULATORY_CARE_PROVIDER_SITE_OTHER): Payer: Medicare Other | Admitting: Internal Medicine

## 2023-04-26 VITALS — BP 135/74 | HR 71 | Resp 16 | Ht 74.0 in

## 2023-04-26 DIAGNOSIS — I10 Essential (primary) hypertension: Secondary | ICD-10-CM | POA: Diagnosis not present

## 2023-04-26 DIAGNOSIS — Z7189 Other specified counseling: Secondary | ICD-10-CM | POA: Diagnosis not present

## 2023-04-26 DIAGNOSIS — G4733 Obstructive sleep apnea (adult) (pediatric): Secondary | ICD-10-CM | POA: Diagnosis not present

## 2023-04-26 NOTE — Patient Instructions (Signed)

## 2023-08-27 NOTE — Progress Notes (Addendum)
 [facility] [address] [place], [place] [address]  Pulmonary Sleep Medicine   Office Visit Note  Patient Name: Delon Revelo DOB: 10-26-1951 MRN [id number]    Chief Complaint: Obstructive Sleep Apnea visit  Brief History:  Hady is seen today for a follow up visit for CPAP@ 10 cmH2O. The patient has a 6 year history of sleep apnea. The patient feels rested after sleeping with PAP.  The patient reports benefiting from PAP use. Reported sleepiness is improved and the Epworth Sleepiness Score is 0 out of 24. The patient does not take naps. The patient complains of the following: Some issue with the mask moving and leaking.  We discussed other mask options.  The compliance download shows 100% compliance with an average use time of 8:40 hours. The AHI is 0.5.  The patient does not complain of limb movements disrupting sleep. The patient continues to require PAP therapy in order to eliminate sleep apnea.   ROS  General: (-) fever, (-) chills, (-) night sweat Nose and Sinuses: (-) nasal stuffiness or itchiness, (-) postnasal drip, (-) nosebleeds, (-) sinus trouble. Mouth and Throat: (-) sore throat, (-) hoarseness. Neck: (-) swollen glands, (-) enlarged thyroid, (-) neck pain. Respiratory: - cough, - shortness of breath, - wheezing. Neurologic: - numbness, - tingling. Psychiatric: - anxiety, - depression   Current Medication: Outpatient Encounter Medications as of 08/30/2023  Medication Sig   aspirin EC 81 MG tablet Take 81 mg by mouth daily.   Blood Glucose Monitoring Suppl DEVI 1 each by Does not apply route in the morning, at noon, and at bedtime. May substitute to any manufacturer covered by patient's insurance.   Cinnamon 500 MG capsule Take 1,000 mg by mouth daily.   clopidogrel (PLAVIX) 75 MG tablet Take 75 mg by mouth daily.   Cyanocobalamin (VITAMIN B-12) 5000 MCG TBDP Take 5,000 mcg by mouth daily.    empagliflozin (JARDIANCE) 10 MG TABS tablet Take 1 tablet  by mouth daily with breakfast.   folic acid-pyridoxine-cyancobalamin (FOLTX) 2.5-25-2 MG TABS tablet Take 1 tablet by mouth daily.   hydrocortisone 2.5 % cream APPLY TO THE GROIN DAILY ON MONDAY, WEDNESDAY, AND FRIDAY.   levothyroxine (SYNTHROID) 112 MCG tablet Take 112 mcg by mouth daily.   lisinopril-hydrochlorothiazide (ZESTORETIC) 20-12.5 MG tablet Take 1 tablet by mouth daily.   metoprolol succinate (TOPROL-XL) 25 MG 24 hr tablet Take 0.5 tablets (12.5 mg total) by mouth daily.   nitroGLYCERIN (NITROSTAT) 0.4 MG SL tablet Place 0.4 mg under the tongue every 5 (five) minutes as needed for chest pain.   PHOSPHATIDYLSERINE PO Take by mouth.   rosuvastatin (CRESTOR) 40 MG tablet Take by mouth. (Patient not taking: Reported on 04/16/2023)   tadalafil (CIALIS) 5 MG tablet Take 5 mg by mouth daily as needed for erectile dysfunction.   traMADol (ULTRAM) 50 MG tablet Take 50 mg by mouth daily as needed for severe pain or moderate pain.   No facility-administered encounter medications on file as of 08/30/2023.    Surgical History: Past Surgical History:  Procedure Laterality Date   CATARACT EXTRACTION     COLONOSCOPY WITH PROPOFOL N/A 01/01/2020   Procedure: COLONOSCOPY WITH PROPOFOL;  Surgeon: [name], Boykin Nearing, MD;  Location: [facility] ENDOSCOPY;  Service: Gastroenterology;  Laterality: N/A;   CORONARY ANGIOPLASTY     ESOPHAGOGASTRODUODENOSCOPY (EGD) WITH PROPOFOL N/A 01/01/2020   Procedure: ESOPHAGOGASTRODUODENOSCOPY (EGD) WITH PROPOFOL;  Surgeon: [name], Boykin Nearing, MD;  Location: [facility] ENDOSCOPY;  Service: Gastroenterology;  Laterality: N/A;  EYE SURGERY     LEFT HEART CATH AND CORONARY ANGIOGRAPHY N/A 04/29/2022   Procedure: LEFT HEART CATH AND CORONARY ANGIOGRAPHY;  Surgeon: Armando Reichert, MD;  Location: [facility] INVASIVE CV LAB;  Service: Cardiovascular;  Laterality: N/A;   RETINAL DETACHMENT SURGERY      Medical History: Past Medical History:  Diagnosis Date   Basal cell carcinoma    L  ear, txted in past   Diabetes mellitus without complication (HCC)    GERD (gastroesophageal reflux disease)    Hyperlipidemia    Hypertension    Left shoulder pain    Myocardial infarction (HCC)     Family History: Non contributory to the present illness  Social History: Social History   Socioeconomic History   Marital status: Married    Spouse name: Not on file   Number of children: Not on file   Years of education: Not on file   Highest education level: Not on file  Occupational History   Not on file  Tobacco Use   Smoking status: Never   Smokeless tobacco: Never  Vaping Use   Vaping status: Never Used  Substance and Sexual Activity   Alcohol use: Yes    Alcohol/week: 5.0 standard drinks of alcohol    Types: 5 Shots of liquor per week   Drug use: Never   Sexual activity: Yes    Birth control/protection: Post-menopausal  Other Topics Concern   Not on file  Social History Narrative   Not on file   Social Determinants of Health   Financial Resource Strain: Not on file  Food Insecurity: Not on file  Transportation Needs: Not on file  Physical Activity: Not on file  Stress: Not on file  Social Connections: Not on file  Intimate Partner Violence: Not on file    Vital Signs: Blood pressure 131/76, pulse (!) 56, resp. rate 16, height 6\' 2"  (1.88 m), weight 262 lb (118.8 kg), SpO2 96%. Body mass index is 33.64 kg/m.    Examination: General Appearance: The patient is well-developed, well-nourished, and in no distress. Neck Circumference: 46 cm Skin: Gross inspection of skin unremarkable. Head: normocephalic, no gross deformities. Eyes: no gross deformities noted. ENT: ears appear grossly normal Neurologic: Alert and oriented. No involuntary movements.  STOP BANG RISK ASSESSMENT S (snore) Have you been told that you snore?     NO   T (tired) Are you often tired, fatigued, or sleepy during the day?   NO  O (obstruction) Do you stop breathing, choke, or  gasp during sleep? NO   P (pressure) Do you have or are you being treated for high blood pressure? YES   B (BMI) Is your body index greater than 35 kg/m? YES   A (age) Are you 20 years old or older? YES   N (neck) Do you have a neck circumference greater than 16 inches?   YES   G (gender) Are you a male? YES   TOTAL STOP/BANG "YES" ANSWERS 5       A STOP-Bang score of 2 or less is considered low risk, and a score of 5 or more is high risk for having either moderate or severe OSA. For people who score 3 or 4, doctors may need to perform further assessment to determine how likely they are to have OSA.         EPWORTH SLEEPINESS SCALE:  Scale:  (0)= no chance of dozing; (1)= slight chance of dozing; (2)= moderate chance of dozing; (3)= high  chance of dozing  Chance  Situtation    Sitting and reading: 0    Watching TV: 0    Sitting Inactive in public: 0    As a passenger in car: 0      Lying down to rest: 0    Sitting and talking: 0    Sitting quielty after lunch: 0    In a car, stopped in traffic: 0   TOTAL SCORE:   0 out of 24    SLEEP STUDIES:  PSG (03/2017) AHI 73/hr, min SpO2 82% Titration (04/2018) CPAP@ 9 cmH2O   CPAP COMPLIANCE DATA:  Date Range: 06/13/23 - 08/29/23  Average Daily Use: 8:40 hours  Median Use: 8:43 hours  Compliance for > 4 Hours: 78 days  AHI: 0.5 respiratory events per hour  Days Used: 78/78  Mask Leak: 19.9  95th Percentile Pressure: 10 cmh20         LABS: No results found for this or any previous visit (from the past 2160 hour(s)).  Radiology: No results found.  No results found.  No results found.    Assessment and Plan: Patient Active Problem List   Diagnosis Date Noted   S/P angioplasty with stent 05/01/2022   GERD (gastroesophageal reflux disease) 04/28/2021   Sleep apnea 04/28/2021   Hyperlipidemia 04/28/2021   Hyperlipidemia    Hypertension    Diabetes mellitus without complication (HCC)     CAD (coronary artery disease)    Thrombocytopenia (HCC)    Acute metabolic encephalopathy    Hypoglycemia    Type 2 diabetes mellitus without complication, without long-term current use of insulin (HCC) 02/11/2017   Combined arterial insufficiency and corporo-venous occlusive erectile dysfunction 02/10/2017   Coronary artery disease 06/06/2014   Hypertension 06/06/2014   Obesity 05/30/2012   Myocardial infarction (HCC) 03/02/2000    1. OSA on CPAP The patient does tolerate PAP and reports  benefit from PAP use. The patient was reminded how to clean equipment and advised to replace supplies routinely. The patient was also counselled on weight loss. The compliance is excellent. The AHI is 0.5.   OSA on cpap- controlled. Continue with excellent compliance with pap. CPAP continues to be medically necessary to treat this patient's OSA. F/u one year.    2. CPAP use counseling CPAP Counseling: had a lengthy discussion with the patient regarding the importance of PAP therapy in management of the sleep apnea. Patient appears to understand the risk factor reduction and also understands the risks associated with untreated sleep apnea. Patient will try to make a good faith effort to remain compliant with therapy. Also instructed the patient on proper cleaning of the device including the water must be changed daily if possible and use of distilled water is preferred. Patient understands that the machine should be regularly cleaned with appropriate recommended cleaning solutions that do not damage the PAP machine for example given white vinegar and water rinses. Other methods such as ozone treatment may not be as good as these simple methods to achieve cleaning.   3. Hypertension, unspecified type Hypertension Counseling:   The following hypertensive lifestyle modification were recommended and discussed:  1. Limiting alcohol intake to less than 1 oz/day of ethanol:(24 oz of beer or 8 oz of wine or 2 oz  of 100-proof whiskey). 2. Take baby ASA 81 mg daily. 3. Importance of regular aerobic exercise and losing weight. 4. Reduce dietary saturated fat and cholesterol intake for overall cardiovascular health. 5. Maintaining adequate dietary potassium, calcium, and  magnesium intake. 6. Regular monitoring of the blood pressure. 7. Reduce sodium intake to less than 100 mmol/day (less than 2.3 gm of sodium or less than 6 gm of sodium choride)      General Counseling: I have discussed the findings of the evaluation and examination with Lollie Sails.  I have also discussed any further diagnostic evaluation thatmay be needed or ordered today. Emmerson verbalizes understanding of the findings of todays visit. We also reviewed his medications today and discussed drug interactions and side effects including but not limited excessive drowsiness and altered mental states. We also discussed that there is always a risk not just to him but also people around him. he has been encouraged to call the office with any questions or concerns that should arise related to todays visit.  No orders of the defined types were placed in this encounter.       I have personally obtained a history, examined the patient, evaluated laboratory and imaging results, formulated the assessment and plan and placed orders. This patient was seen today by Emmaline Kluver, PA-C in collaboration with Dr. Freda Munro.   Yevonne Pax, MD [facility] Diplomate ABMS Pulmonary Critical Care Medicine and Sleep Medicine

## 2023-08-30 ENCOUNTER — Ambulatory Visit (INDEPENDENT_AMBULATORY_CARE_PROVIDER_SITE_OTHER): Payer: Medicare Other | Admitting: Internal Medicine

## 2023-08-30 VITALS — BP 131/76 | HR 56 | Resp 16 | Ht 74.0 in | Wt 262.0 lb

## 2023-08-30 DIAGNOSIS — I10 Essential (primary) hypertension: Secondary | ICD-10-CM

## 2023-08-30 DIAGNOSIS — Z7189 Other specified counseling: Secondary | ICD-10-CM | POA: Insufficient documentation

## 2023-08-30 DIAGNOSIS — G4733 Obstructive sleep apnea (adult) (pediatric): Secondary | ICD-10-CM | POA: Insufficient documentation

## 2023-08-30 NOTE — Patient Instructions (Signed)

## 2023-10-14 ENCOUNTER — Emergency Department: Payer: Medicare Other

## 2023-10-14 ENCOUNTER — Encounter: Payer: Self-pay | Admitting: Emergency Medicine

## 2023-10-14 ENCOUNTER — Other Ambulatory Visit: Payer: Self-pay

## 2023-10-14 ENCOUNTER — Emergency Department
Admission: EM | Admit: 2023-10-14 | Discharge: 2023-10-14 | Disposition: A | Discharge status: Home / Self Care | Payer: Medicare Other | Attending: Emergency Medicine | Admitting: Emergency Medicine

## 2023-10-14 DIAGNOSIS — Z7901 Long term (current) use of anticoagulants: Secondary | ICD-10-CM | POA: Insufficient documentation

## 2023-10-14 DIAGNOSIS — S12401A Unspecified nondisplaced fracture of fifth cervical vertebra, initial encounter for closed fracture: Secondary | ICD-10-CM | POA: Diagnosis not present

## 2023-10-14 DIAGNOSIS — W228XXA Striking against or struck by other objects, initial encounter: Secondary | ICD-10-CM | POA: Insufficient documentation

## 2023-10-14 DIAGNOSIS — Z23 Encounter for immunization: Secondary | ICD-10-CM | POA: Diagnosis not present

## 2023-10-14 DIAGNOSIS — S060XAA Concussion with loss of consciousness status unknown, initial encounter: Secondary | ICD-10-CM | POA: Insufficient documentation

## 2023-10-14 DIAGNOSIS — S0990XA Unspecified injury of head, initial encounter: Secondary | ICD-10-CM | POA: Diagnosis present

## 2023-10-14 MED ORDER — TETANUS-DIPHTH-ACELL PERTUSSIS 5-2.5-18.5 LF-MCG/0.5 IM SUSY
0.5000 mL | PREFILLED_SYRINGE | Freq: Once | INTRAMUSCULAR | Status: AC
  Administered 2023-10-14: 0.5 mL via INTRAMUSCULAR
  Filled 2023-10-14: qty 0.5

## 2023-10-14 MED ORDER — OXYCODONE HCL 5 MG PO TABS: 5.0000 mg | ORAL_TABLET | Freq: Four times a day (QID) | ORAL | 0 refills | Status: AC | PRN

## 2023-10-14 MED ORDER — OXYCODONE HCL 5 MG PO TABS
5.0000 mg | ORAL_TABLET | Freq: Once | ORAL | Status: AC
  Administered 2023-10-14: 5 mg via ORAL
  Filled 2023-10-14: qty 1

## 2023-10-14 MED ORDER — CEPHALEXIN 500 MG PO CAPS: 500.0000 mg | ORAL_CAPSULE | Freq: Two times a day (BID) | ORAL | 0 refills | Status: AC

## 2023-10-14 NOTE — ED Provider Triage Note (Signed)
Emergency Medicine Provider Triage Evaluation Note  Nicholas Abbott , a 72 y.o. male  was evaluated in triage.  Pt complains of head injury. Reports few days ago (Monday) he was driving his tractor into the garage and a piece of the tractor fell of and landed on his head. Reports bruised his head, arm, and hand. On plavix. Reports that his neck is stiff. No LOC. No dizziness, weakness, tingling.  Review of Systems  Positive: Headache, neck pain Negative: vomiting  Physical Exam  There were no vitals taken for this visit. Gen:   Awake, no distress   Resp:  Normal effort  MSK:   Moves extremities without difficulty  Other:    Medical Decision Making  Medically screening exam initiated at 11:18 AM.  Appropriate orders placed.  Darol Destine was informed that the remainder of the evaluation will be completed by another provider, this initial triage assessment does not replace that evaluation, and the importance of remaining in the ED until their evaluation is complete.     Jackelyn Hoehn, PA-C 10/14/23 1121

## 2023-10-14 NOTE — Discharge Instructions (Addendum)
You can take Tylenol 1 g every 8 hours.  Use the oxycodone for breakthrough pain.  Do not use with your tramadol and do not drive on it.  Leave your collar on except  when you are in the shower.  This includes at nighttime.  They will follow-up in clinic in 1 to 2 weeks if you do not hear from them please call them to make a appointment.  Return for numbness, weakness or any other concerns.    IMPRESSION: 1. Acute fracture of the base of the C5 spinous process with extension to involve the right lamina and extension to involve the anterior right C5-C6 facet joint. No specific evidence of traumatic malalignment but an MRI could provide evaluation for ligamentous or facet joint capsule injury if clinically warranted. 2. No evidence of acute intracranial abnormality.  1. No evidence of ligamentous injury. No evidence of epidural hematoma. 2. See same day cervical spine CT for osseous findings. No new fractures are visualized. 3. Multilevel degenerative changes of the cervical spine, most pronounced at C4-C5 where there is moderate spinal canal narrowing and severe bilateral neural foraminal narrowing.  Take oxycodone as prescribed. Do not drink alcohol, drive or participate in any other potentially dangerous activities while taking this medication as it may make you sleepy. Do not take this medication with any other sedating medications, either prescription or over-the-counter. If you were prescribed Percocet or Vicodin, do not take these with acetaminophen (Tylenol) as it is already contained within these medications.  This medication is an opiate (or narcotic) pain medication and can be habit forming. Use it as little as possible to achieve adequate pain control. Do not use or use it with extreme caution if you have a history of opiate abuse or dependence. If you are on a pain contract with your primary care doctor or a pain specialist, be sure to let them know you were prescribed this  medication today from the Emergency Department. This medication is intended for your use only - do not give any to anyone else and keep it in a secure place where nobody else, especially children, have access to it.

## 2023-10-14 NOTE — ED Provider Notes (Signed)
[facility] Provider Note    Event Date/Time   First MD Initiated Contact with Patient 10/14/23 1426     (approximate)   History   Head Injury   HPI  Nicholas Abbott is a 72 y.o. male who is on Plavix who comes in with concern for head injury.  Patient reports head injury on Monday where he hit his head on the garage door while driving a tractor into the garage.  He was not evaluated on the day but he does report some pain in his head and neck.  When I mentioned any blurred vision that was mentioned in the triage note he stated that his vision is exactly normal he denies any changes in his vision only at that at the end of the day he feels like his eyes are more fatigued.  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  He denies any vision changes at this time.  He does report some continued mild headaches.     Physical Exam   Triage Vital Signs: ED Triage Vitals  Encounter Vitals Group     BP 10/14/23 1121 (!) 167/91     Systolic BP Percentile --      Diastolic BP Percentile --      Pulse Rate 10/14/23 1121 (!) 58     Resp 10/14/23 1121 16     Temp 10/14/23 1121 97.6 F (36.4 C)     Temp Source 10/14/23 1121 Oral     SpO2 10/14/23 1121 99 %     Weight 10/14/23 1122 250 lb (113.4 kg)     Height --      Head Circumference --      Peak Flow --      Pain Score 10/14/23 1122 6     Pain Loc --      Pain Education --      Exclude from Growth Chart --     Most recent vital signs: Vitals:   10/14/23 1121  BP: (!) 167/91  Pulse: (!) 58  Resp: 16  Temp: 97.6 F (36.4 C)  SpO2: 99%     General: Awake, no distress.  CV:  Good peripheral perfusion.  Resp:  Normal effort.  Abd:  No distention.  Other:  Hematoma to the forehead.  Patient is in a c-collar.  He is got good equal grip strength.  No numbness or tingling.  He reports some  pain on his right shoulder but has full range of motion of it.  He has some abrasions on the back of his left hand with some mild swelling but no snuffbox tenderness. Extraocular movements are intact.    ED Results / Procedures / Treatments   Labs (all labs ordered are listed, but only abnormal results are displayed) Labs Reviewed - No data to display      RADIOLOGY I have reviewed the CT head personally interpreted no evidence of intracranial hemorrhage  PROCEDURES:  Critical Care performed: No  Procedures   MEDICATIONS ORDERED IN ED: Medications - No data to display   IMPRESSION / MDM / ASSESSMENT AND PLAN / ED COURSE  I reviewed the triage vital signs and the nursing notes.   Patient's presentation is most consistent with acute presentation with potential threat to life or bodily function.   Differential intracranial hemorrhage, cervical fracture.  Patient is neuro intact.  He is got full range of motion and no tenderness on examination to suggest fracture of her shoulder or hand.  There is a tiny bit of redness around the outside of some abrasions on his hand but no focal cellulitis.  He is going to use some over-the-counter antibiotic cream that he already has at home for these.  IMPRESSION: 1. Acute fracture of the base of the C5 spinous process with extension to involve the right lamina and extension to involve the anterior right C5-C6 facet joint. No specific evidence of traumatic malalignment but an MRI could provide evaluation for ligamentous or facet joint capsule injury if clinically warranted. 2. No evidence of acute intracranial abnormality.  1. No evidence of ligamentous injury. No evidence of epidural hematoma. 2. See same day cervical spine CT for osseous findings. No new fractures are visualized. 3. Multilevel degenerative changes of the cervical spine, most pronounced at C4-C5 where there is moderate spinal canal narrowing and severe bilateral  neural foraminal narrowing.      Discussed case with neurosurg Dr Katrinka Blazing to see if any CT angio was needed or other imaginging. NO more imaging. C collar only.  okay to be off in the shower, would prefer to wear at night until we clear him. will do an expedited clearance in [place]. like 1-2 weeks with flex ex   Discussed the above with patient and he expressed understanding.  Will prescribe some oxycodone since his tramadol at home was not working.  He understands not to drive or work on this.  Given his mild headaches and fatigue eyes at nighttime I discussed that he probably has a mild concussion.  He does not want to follow-up with neurology outpatient but we did discuss concussion precautions.  The patient is on the cardiac monitor to evaluate for evidence of arrhythmia and/or significant heart rate changes.      FINAL CLINICAL IMPRESSION(S) / ED DIAGNOSES   Final diagnoses:  Closed nondisplaced fracture of fifth cervical vertebra, unspecified fracture morphology, initial encounter (HCC)  Concussion with unknown loss of consciousness status, initial encounter     Rx / DC Orders   ED Discharge Orders          Ordered    oxyCODONE (ROXICODONE) 5 MG immediate release tablet  Every 6 hours PRN        10/14/23 1442             Note:  This document was prepared using Dragon voice recognition software and may include unintentional dictation errors.   Concha Se, MD 10/14/23 [id number]

## 2023-10-14 NOTE — ED Triage Notes (Addendum)
Presents stating Monday hit head on garage door while driving tractor into garage.  He takes a blood thinner (Plavix). He was not evaluated on day of injury. New vision blurring in evenings since injury, endorses tenderness to entire head and neck. Denies emesis, trouble ambulating.

## 2023-10-18 NOTE — Progress Notes (Signed)
Referring Physician:  Concha Se, MD [address] [place],  [place] [address]  Primary Physician:  Enid Baas, MD  History of Present Illness: 10/25/2023 [name] is here today with a chief complaint of trauma follow-up.  He has a cervical fracture after having a garage door fall onto his neck and shoulder.  He is denying any neck pain.  No radiating pain.  No pain down his back.  No pain down his legs.  Has not had any new numbness tingling.  He does feel like he has right shoulder weakness.  States that his incredibly painful when he tries to move it himself.  Not showing any signs or symptoms of cervical radiculopathy or myelopathy.  I have utilized the care everywhere function in epic to review the outside records available from external health systems.  Review of Systems:  A 10 point review of systems is negative, except for the pertinent positives and negatives detailed in the HPI.  Past Medical History: Past Medical History:  Diagnosis Date   Basal cell carcinoma    L ear, txted in past   Diabetes mellitus without complication (HCC)    GERD (gastroesophageal reflux disease)    Hyperlipidemia    Hypertension    Left shoulder pain    Myocardial infarction [facility])     Past Surgical History: Past Surgical History:  Procedure Laterality Date   CATARACT EXTRACTION     COLONOSCOPY WITH PROPOFOL N/A 01/01/2020   Procedure: COLONOSCOPY WITH PROPOFOL;  Surgeon: [name], Boykin Nearing, MD;  Location: [facility] ENDOSCOPY;  Service: Gastroenterology;  Laterality: N/A;   CORONARY ANGIOPLASTY     ESOPHAGOGASTRODUODENOSCOPY (EGD) WITH PROPOFOL N/A 01/01/2020   Procedure: ESOPHAGOGASTRODUODENOSCOPY (EGD) WITH PROPOFOL;  Surgeon: [name], Boykin Nearing, MD;  Location: [facility] ENDOSCOPY;  Service: Gastroenterology;  Laterality: N/A;   EYE SURGERY     LEFT HEART CATH AND CORONARY ANGIOGRAPHY N/A 04/29/2022   Procedure: LEFT HEART CATH AND CORONARY ANGIOGRAPHY;  Surgeon: Armando Reichert, MD;  Location: [facility] INVASIVE CV LAB;  Service: Cardiovascular;  Laterality: N/A;   RETINAL DETACHMENT SURGERY      Allergies: Allergies as of 10/25/2023 - Review Complete 10/25/2023  Allergen Reaction Noted   Pioglitazone Rash 01/18/2019    Medications:  Current Outpatient Medications:    acetaminophen (TYLENOL) 500 MG tablet, Take 1,000 mg by mouth every 8 (eight) hours as needed., Disp: , Rfl:    aspirin EC 81 MG tablet, Take 81 mg by mouth daily., Disp: , Rfl:    Blood Glucose Monitoring Suppl DEVI, 1 each by Does not apply route in the morning, at noon, and at bedtime. May substitute to any manufacturer covered by patient's insurance., Disp: 1 each, Rfl: 0   Cinnamon 500 MG capsule, Take 1,000 mg by mouth daily., Disp: , Rfl:    clopidogrel (PLAVIX) 75 MG tablet, Take 75 mg by mouth daily., Disp: , Rfl:    Cyanocobalamin (VITAMIN B-12) 5000 MCG TBDP, Take 5,000 mcg by mouth daily. , Disp: , Rfl:    empagliflozin (JARDIANCE) 10 MG TABS tablet, Take 1 tablet by mouth daily with breakfast., Disp: , Rfl:    folic acid-pyridoxine-cyancobalamin (FOLTX) 2.5-25-2 MG TABS tablet, Take 1 tablet by mouth daily., Disp: , Rfl:    hydrocortisone 2.5 % cream, APPLY TO THE GROIN DAILY ON MONDAY, WEDNESDAY, AND FRIDAY., Disp: 28 g, Rfl: 1   levothyroxine (SYNTHROID) 112 MCG tablet, Take 112 mcg by mouth daily., Disp: , Rfl:  lisinopril-hydrochlorothiazide (ZESTORETIC) 20-12.5 MG tablet, Take 1 tablet by mouth daily., Disp: , Rfl:    metoprolol succinate (TOPROL-XL) 25 MG 24 hr tablet, Take 0.5 tablets (12.5 mg total) by mouth daily., Disp: 15 tablet, Rfl: 11   nitroGLYCERIN (NITROSTAT) 0.4 MG SL tablet, Place 0.4 mg under the tongue every 5 (five) minutes as needed for chest pain., Disp: , Rfl:    PHOSPHATIDYLSERINE PO, Take by mouth., Disp: , Rfl:    rosuvastatin (CRESTOR) 40 MG tablet, Take by mouth. (Patient not taking: Reported on 04/16/2023), Disp: , Rfl:    tadalafil (CIALIS) 5 MG  tablet, Take 5 mg by mouth daily as needed for erectile dysfunction., Disp: , Rfl:    traMADol (ULTRAM) 50 MG tablet, Take 50 mg by mouth daily as needed for severe pain or moderate pain., Disp: , Rfl:   Social History: Social History   Tobacco Use   Smoking status: Never   Smokeless tobacco: Never  Vaping Use   Vaping status: Never Used  Substance Use Topics   Alcohol use: Yes    Alcohol/week: 5.0 standard drinks of alcohol    Types: 5 Shots of liquor per week   Drug use: Never    Family Medical History: Family History  Problem Relation Age of Onset   Leukemia Father     Physical Examination: Vitals:   10/25/23 1112  BP: 112/68    General: Patient is in no apparent distress. Attention to examination is appropriate.  Neck:   Supple.  Full range of motion.  Respiratory: Patient is breathing without any difficulty.   NEUROLOGICAL:     Awake, alert, oriented to person, place, and time.  Speech is clear and fluent.   Cranial Nerves: Monovision, eyes not conjugate at baseline facial tone is asymmetric with a slight left facial droop which is at his baseline per family.  Facial sensation is symmetric. Shoulder shrug is symmetric. Tongue protrusion is midline.    Strength: He has good strength in his bilateral hands, slight intrinsic weakness on the left from an old ulnar injury.  His bicep strength is good bilaterally tricep strength is good bilaterally.  Bicep strength on the right does cause some pain at his shoulder.  Also has pain on external rotation.  Passive range of motion is good without evidence of pain, however when he tries to activate he has severe difficulty even when going from 0 to 30 degrees.  Reflexes are 1+ and symmetric at the biceps, triceps, brachioradialis,  No numbness in the axillary nerve or C6 or C5 distribution     No evidence of dysmetria noted.  Walks without difficulty.  Imaging: arrative & Impression  CLINICAL DATA:  Head trauma,  minor (Age >= 65y); Neck trauma (Age >= 65y)   EXAM: CT HEAD WITHOUT CONTRAST   CT CERVICAL SPINE WITHOUT CONTRAST   TECHNIQUE: Multidetector CT imaging of the head and cervical spine was performed following the standard protocol without intravenous contrast. Multiplanar CT image reconstructions of the cervical spine were also generated.   RADIATION DOSE REDUCTION: This exam was performed according to the departmental dose-optimization program which includes automated exposure control, adjustment of the mA and/or kV according to patient size and/or use of iterative reconstruction technique.   COMPARISON:  None Available.   FINDINGS: CT HEAD FINDINGS   Brain: No evidence of acute infarction, hemorrhage, hydrocephalus, extra-axial collection or mass lesion/mass effect.   Vascular: No hyperdense vessel or unexpected calcification.   Skull: No acute fracture.  Sinuses/Orbits: No acute finding.   CT CERVICAL SPINE FINDINGS   Alignment: No substantial sagittal subluxation.   Skull base and vertebrae: Acute fracture of the base of the C5 spinous process with extension to involve the right lamina and extension to involve the anterior right C5-C6 facet joint.   Soft tissues and spinal canal: No prevertebral fluid or swelling. No visible canal hematoma.   Disc levels:  Lower cervical degenerative change, greatest at C6-C7.   Upper chest: Visualized lung apices are clear.   IMPRESSION: 1. Acute fracture of the base of the C5 spinous process with extension to involve the right lamina and extension to involve the anterior right C5-C6 facet joint. No specific evidence of traumatic malalignment but an MRI could provide evaluation for ligamentous or facet joint capsule injury if clinically warranted. 2. No evidence of acute intracranial abnormality.     Electronically Signed   By: Feliberto Harts M.D.   On: 10/14/2023 12:03    I have personally reviewed the images and  agree with the above interpretation.  Medical Decision Making/Assessment and Plan: [name] is a pleasant 72 y.o. male with history of a neck and shoulder trauma a little over 2 weeks ago.  He was driving his tractor when it struck a door resulting in the garage door falling onto his shoulder and neck.  Came into the hospital with mostly right sided shoulder pain.  Was found to have a cervical spinous process fracture as well as some slight extension into the right 5 6 facet.  He has not had any neck pain.  No radicular pain.  No myelopathic symptoms.  His strength is good with the exception of pain limitation in the right upper extremity proximally.  He has good passive range of motion in the right shoulder but when activating gets severe pain in his right shoulder.  This is not associated with any sensory loss.  X-rays today have not been reviewed by radiology but show no evidence of traumatic instability.  No evidence of mobile spondylolisthesis or increased splaying of the 5 6 spinous processes.  He had good range of motion without severe pain in his neck.  Given these findings he no longer needs to wear his collar for stability.  We do feel like his trauma would put him at risk for traumatic shoulder traumatic rotator cuff injury.  We like him to see orthopedic surgery for evaluation as he is quite limited and has significant amount of pain at this time.  We have made a referral.  He may go to orthopedic urgent care for short-term evaluation and pain control.  Thank you for involving me in the care of this patient.   Spent a total of 40 minutes with this patient, reviewing their chart, going over their imaging, direct face-to-face evaluation, counseling on the cervical spine fracture, evaluation of the shoulder and coordination of their care.  Lovenia Kim MD/MSCR Neurosurgery

## 2023-10-25 ENCOUNTER — Ambulatory Visit (INDEPENDENT_AMBULATORY_CARE_PROVIDER_SITE_OTHER): Payer: Medicare Other | Admitting: Neurosurgery

## 2023-10-25 ENCOUNTER — Other Ambulatory Visit: Payer: Self-pay

## 2023-10-25 ENCOUNTER — Encounter: Payer: Self-pay | Admitting: Neurosurgery

## 2023-10-25 ENCOUNTER — Ambulatory Visit
Admission: RE | Admit: 2023-10-25 | Discharge: 2023-10-25 | Disposition: A | Discharge status: Home / Self Care | Payer: Medicare Other | Attending: Neurosurgery | Admitting: Neurosurgery

## 2023-10-25 ENCOUNTER — Ambulatory Visit
Admission: RE | Admit: 2023-10-25 | Discharge: 2023-10-25 | Disposition: A | Discharge status: Home / Self Care | Payer: Medicare Other | Source: Ambulatory Visit | Attending: Neurosurgery | Admitting: Neurosurgery

## 2023-10-25 VITALS — BP 112/68 | Ht 74.0 in | Wt 257.2 lb

## 2023-10-25 DIAGNOSIS — W228XXA Striking against or struck by other objects, initial encounter: Secondary | ICD-10-CM | POA: Diagnosis not present

## 2023-10-25 DIAGNOSIS — M25511 Pain in right shoulder: Secondary | ICD-10-CM | POA: Insufficient documentation

## 2023-10-25 DIAGNOSIS — S12401A Unspecified nondisplaced fracture of fifth cervical vertebra, initial encounter for closed fracture: Secondary | ICD-10-CM

## 2023-10-25 DIAGNOSIS — S12401D Unspecified nondisplaced fracture of fifth cervical vertebra, subsequent encounter for fracture with routine healing: Secondary | ICD-10-CM | POA: Insufficient documentation

## 2023-10-25 DIAGNOSIS — S12491D Other nondisplaced fracture of fifth cervical vertebra, subsequent encounter for fracture with routine healing: Secondary | ICD-10-CM

## 2023-10-29 ENCOUNTER — Other Ambulatory Visit: Payer: Self-pay | Admitting: Student

## 2023-10-29 DIAGNOSIS — M25511 Pain in right shoulder: Secondary | ICD-10-CM

## 2023-10-29 DIAGNOSIS — M7581 Other shoulder lesions, right shoulder: Secondary | ICD-10-CM

## 2023-10-29 DIAGNOSIS — M7521 Bicipital tendinitis, right shoulder: Secondary | ICD-10-CM

## 2023-10-29 DIAGNOSIS — S46011A Strain of muscle(s) and tendon(s) of the rotator cuff of right shoulder, initial encounter: Secondary | ICD-10-CM

## 2023-10-30 ENCOUNTER — Ambulatory Visit
Admission: RE | Admit: 2023-10-30 | Discharge: 2023-10-30 | Disposition: A | Discharge status: Home / Self Care | Payer: Medicare Other | Source: Ambulatory Visit | Attending: Student | Admitting: Student

## 2023-10-30 DIAGNOSIS — M7521 Bicipital tendinitis, right shoulder: Secondary | ICD-10-CM

## 2023-10-30 DIAGNOSIS — M7581 Other shoulder lesions, right shoulder: Secondary | ICD-10-CM

## 2023-10-30 DIAGNOSIS — S46011A Strain of muscle(s) and tendon(s) of the rotator cuff of right shoulder, initial encounter: Secondary | ICD-10-CM

## 2023-10-30 DIAGNOSIS — M25511 Pain in right shoulder: Secondary | ICD-10-CM

## 2023-12-15 ENCOUNTER — Ambulatory Visit: Payer: Medicare Other | Admitting: Dermatology

## 2023-12-20 ENCOUNTER — Ambulatory Visit (INDEPENDENT_AMBULATORY_CARE_PROVIDER_SITE_OTHER): Payer: Medicare Other | Admitting: Dermatology

## 2023-12-20 ENCOUNTER — Encounter: Payer: Self-pay | Admitting: Dermatology

## 2023-12-20 DIAGNOSIS — Z79899 Other long term (current) drug therapy: Secondary | ICD-10-CM

## 2023-12-20 DIAGNOSIS — L570 Actinic keratosis: Secondary | ICD-10-CM | POA: Diagnosis not present

## 2023-12-20 DIAGNOSIS — D692 Other nonthrombocytopenic purpura: Secondary | ICD-10-CM

## 2023-12-20 DIAGNOSIS — S51811A Laceration without foreign body of right forearm, initial encounter: Secondary | ICD-10-CM

## 2023-12-20 DIAGNOSIS — D1801 Hemangioma of skin and subcutaneous tissue: Secondary | ICD-10-CM

## 2023-12-20 DIAGNOSIS — Z85828 Personal history of other malignant neoplasm of skin: Secondary | ICD-10-CM

## 2023-12-20 DIAGNOSIS — Z1283 Encounter for screening for malignant neoplasm of skin: Secondary | ICD-10-CM

## 2023-12-20 DIAGNOSIS — L814 Other melanin hyperpigmentation: Secondary | ICD-10-CM

## 2023-12-20 DIAGNOSIS — L578 Other skin changes due to chronic exposure to nonionizing radiation: Secondary | ICD-10-CM

## 2023-12-20 DIAGNOSIS — B353 Tinea pedis: Secondary | ICD-10-CM

## 2023-12-20 DIAGNOSIS — B356 Tinea cruris: Secondary | ICD-10-CM

## 2023-12-20 DIAGNOSIS — L821 Other seborrheic keratosis: Secondary | ICD-10-CM

## 2023-12-20 DIAGNOSIS — W908XXA Exposure to other nonionizing radiation, initial encounter: Secondary | ICD-10-CM | POA: Diagnosis not present

## 2023-12-20 DIAGNOSIS — Z5111 Encounter for antineoplastic chemotherapy: Secondary | ICD-10-CM

## 2023-12-20 DIAGNOSIS — Z7189 Other specified counseling: Secondary | ICD-10-CM

## 2023-12-20 DIAGNOSIS — D229 Melanocytic nevi, unspecified: Secondary | ICD-10-CM

## 2023-12-20 DIAGNOSIS — L409 Psoriasis, unspecified: Secondary | ICD-10-CM

## 2023-12-20 MED ORDER — KETOCONAZOLE 2 % EX CREA: TOPICAL_CREAM | CUTANEOUS | 11 refills | Status: AC

## 2023-12-20 NOTE — Patient Instructions (Addendum)
- Start 5-fluorouracil/calcipotriene cream twice a day for 10 days to affected areas including scalp. Prescription sent to [facility]. Patient advised they will receive an email to purchase the medication online and have it sent to their home. Patient provided with handout reviewing treatment course and side effects and advised to call or message [place] on MyChart with any concerns.  Reviewed course of treatment and expected reaction.  Patient advised to expect inflammation and crusting and advised that erosions are possible.  Patient advised to be diligent with sun protection during and after treatment. Counseled to keep medication out of reach of children and pets.     Instructions for [facility]  One or more of your medications was sent to the [facility] mail order [facility] pharmacy. You will receive an email from them and can purchase the medicine through that link. It will then be mailed to your home at the address you confirmed. If for any reason you do not receive an email from them, please check your spam folder. If you still do not find the email, please let us know. [facility] phone number is [phone].     5-Fluorouracil/Calcipotriene Patient Education   Actinic keratoses are the dry, red scaly spots on the skin caused by sun damage. A portion of these spots can turn into skin cancer with time, and treating them can help prevent development of skin cancer.   Treatment of these spots requires removal of the defective skin cells. There are various ways to remove actinic keratoses, including freezing with liquid nitrogen, treatment with creams, or treatment with a blue light procedure in the office.   5-fluorouracil cream is a topical cream used to treat actinic keratoses. It works by interfering with the growth of abnormal fast-growing skin cells, such as actinic keratoses. These cells peel off and are replaced by healthy ones.    5-fluorouracil/calcipotriene is a combination of the 5-fluorouracil cream with a vitamin D analog cream called calcipotriene. The calcipotriene alone does not treat actinic keratoses. However, when it is combined with 5-fluorouracil, it helps the 5-fluorouracil treat the actinic keratoses much faster so that the same results can be achieved with a much shorter treatment time.  INSTRUCTIONS FOR 5-FLUOROURACIL/CALCIPOTRIENE CREAM:   5-fluorouracil/calcipotriene cream typically only needs to be used for 4-7 days. A thin layer should be applied twice a day to the treatment areas recommended by your physician.   If your physician prescribed you separate tubes of 5-fluourouracil and calcipotriene, apply a thin layer of 5-fluorouracil followed by a thin layer of calcipotriene.   Avoid contact with your eyes, nostrils, and mouth. Do not use 5-fluorouracil/calcipotriene cream on infected or open wounds.   You will develop redness, irritation and some crusting at areas where you have pre-cancer damage/actinic keratoses. IF YOU DEVELOP PAIN, BLEEDING, OR SIGNIFICANT CRUSTING, STOP THE TREATMENT EARLY - you have already gotten a good response and the actinic keratoses should clear up well.  Wash your hands after applying 5-fluorouracil 5% cream on your skin.   A moisturizer or sunscreen with a minimum SPF 30 should be applied each morning.   Once you have finished the treatment, you can apply a thin layer of Vaseline twice a day to irritated areas to soothe and calm the areas more quickly. If you experience significant discomfort, contact your physician.  For some patients it is necessary to repeat the treatment for best results.  SIDE EFFECTS: When using 5-fluorouracil/calcipotriene cream, you may have mild irritation, such  as redness, dryness, swelling, or a mild burning sensation. This usually resolves within 2 weeks. The more actinic keratoses you have, the more redness and inflammation you can  expect during treatment. Eye irritation has been reported rarely. If this occurs, please let us know.  If you have any trouble using this cream, please call the office. If you have any other questions about this information, please do not hesitate to ask me before you leave the office.Actinic keratoses are precancerous spots that appear secondary to cumulative UV radiation exposure/sun exposure over time. They are chronic with expected duration over 1 year. A portion of actinic keratoses will progress to squamous cell carcinoma of the skin. It is not possible to reliably predict which spots will progress to skin cancer and so treatment is recommended to prevent development of skin cancer.  Recommend daily broad spectrum sunscreen SPF 30+ to sun-exposed areas, reapply every 2 hours as needed.  Recommend staying in the shade or wearing long sleeves, sun glasses (UVA+UVB protection) and wide brim hats (4-inch brim around the entire circumference of the hat). Call for new or changing lesions.   Cryotherapy Aftercare  Wash gently with soap and water everyday.   Apply Vaseline and Band-Aid daily until healed.        Melanoma ABCDEs  Melanoma is the most dangerous type of skin cancer, and is the leading cause of death from skin disease.  You are more likely to develop melanoma if you: Have light-colored skin, light-colored eyes, or red or blond hair Spend a lot of time in the sun Tan regularly, either outdoors or in a tanning bed Have had blistering sunburns, especially during childhood Have a close family member who has had a melanoma Have atypical moles or large birthmarks  Early detection of melanoma is key since treatment is typically straightforward and cure rates are extremely high if we catch it early.   The first sign of melanoma is often a change in a mole or a new dark spot.  The ABCDE system is a way of remembering the signs of melanoma.  A for asymmetry:  The two halves do not  match. B for border:  The edges of the growth are irregular. C for color:  A mixture of colors are present instead of an even brown color. D for diameter:  Melanomas are usually (but not always) greater than 6mm - the size of a pencil eraser. E for evolution:  The spot keeps changing in size, shape, and color.  Please check your skin once per month between visits. You can use a small mirror in front and a large mirror behind you to keep an eye on the back side or your body.   If you see any new or changing lesions before your next follow-up, please call to schedule a visit.  Please continue daily skin protection including broad spectrum sunscreen SPF 30+ to sun-exposed areas, reapplying every 2 hours as needed when you're outdoors.   Staying in the shade or wearing long sleeves, sun glasses (UVA+UVB protection) and wide brim hats (4-inch brim around the entire circumference of the hat) are also recommended for sun protection.    Due to recent changes in healthcare laws, you may see results of your pathology and/or laboratory studies on MyChart before the doctors have had a chance to review them. We understand that in some cases there may be results that are confusing or concerning to you. Please understand that not all results are received at the  same time and often the doctors may need to interpret multiple results in order to provide you with the best plan of care or course of treatment. Therefore, we ask that you please give [place] 2 business days to thoroughly review all your results before contacting the office for clarification. Should we see a critical lab result, you will be contacted sooner.   If You Need Anything After Your Visit  If you have any questions or concerns for your doctor, please call our main line at [phone] and press option 4 to reach your doctor's medical assistant. If no one answers, please leave a voicemail as directed and we will return your call as soon as possible.  Messages left after 4 pm will be answered the following business day.   You may also send [place] a message via MyChart. We typically respond to MyChart messages within 1-2 business days.  For prescription refills, please ask your pharmacy to contact our office. Our fax number is [phone].  If you have an urgent issue when the clinic is closed that cannot wait until the next business day, you can page your doctor at the number below.    Please note that while we do our best to be available for urgent issues outside of office hours, we are not available 24/7.   If you have an urgent issue and are unable to reach [place], you may choose to seek medical care at your doctor's office, retail clinic, urgent care center, or emergency room.  If you have a medical emergency, please immediately call 911 or go to the emergency department.  Pager Numbers  - Dr. Gwen Pounds: [phone]  - Dr. Roseanne Reno: [phone]  - Dr. Katrinka Blazing: [phone]   In the event of inclement weather, please call our main line at [phone] for an update on the status of any delays or closures.  Dermatology Medication Tips: Please keep the boxes that topical medications come in in order to help keep track of the instructions about where and how to use these. Pharmacies typically print the medication instructions only on the boxes and not directly on the medication tubes.   If your medication is too expensive, please contact our office at [phone] option 4 or send [place] a message through MyChart.   We are unable to tell what your co-pay for medications will be in advance as this is different depending on your insurance coverage. However, we may be able to find a substitute medication at lower cost or fill out paperwork to get insurance to cover a needed medication.   If a prior authorization is required to get your medication covered by your insurance company, please allow [place] 1-2 business days to complete this process.  Drug  prices often vary depending on where the prescription is filled and some pharmacies may offer cheaper prices.  The website www.goodrx.com contains coupons for medications through different pharmacies. The prices here do not account for what the cost may be with help from insurance (it may be cheaper with your insurance), but the website can give you the price if you did not use any insurance.  - You can print the associated coupon and take it with your prescription to the pharmacy.  - You may also stop by our office during regular business hours and pick up a GoodRx coupon card.  - If you need your prescription sent electronically to a different pharmacy, notify our office through [facility] or by phone at [phone] option 4.  Si Usted Necesita Algo Despus de Su Visita  Tambin puede enviarnos un mensaje a travs de Clinical cytogeneticist. Por lo general respondemos a los mensajes de MyChart en el transcurso de 1 a 2 das hbiles.  Para renovar recetas, por favor pida a su farmacia que se ponga en contacto con nuestra oficina. Annie Sable de fax es [place] [phone].  Si tiene un asunto urgente cuando la clnica est cerrada y que no puede esperar hasta el siguiente da hbil, puede llamar/localizar a su doctor(a) al nmero que aparece a continuacin.   Por favor, tenga en cuenta que aunque hacemos todo lo posible para estar disponibles para asuntos urgentes fuera del horario de Manuel Garcia, no estamos disponibles las 24 horas del da, los 7 [address]  [address] de la [place].   Si tiene un problema urgente y no puede comunicarse con nosotros, puede optar por buscar atencin mdica  en el consultorio de su doctor(a), en una clnica privada, en un centro de atencin urgente o en una sala de emergencias.  Si tiene Engineer, drilling, por favor llame inmediatamente al 911 o vaya a la sala de emergencias.  Nmeros de bper  - Dr. Gwen Pounds: [phone]  - Dra. Roseanne Reno: [phone]  - Dr. Katrinka Blazing:  [phone]   En caso de inclemencias del tiempo, por favor llame a Lacy Duverney principal al [phone] para una actualizacin sobre el [place] de cualquier retraso o cierre.  Consejos para la medicacin en dermatologa: Por favor, guarde las cajas en las que vienen los medicamentos de uso tpico para ayudarle a seguir las instrucciones sobre dnde y cmo usarlos. Las farmacias generalmente imprimen las instrucciones del medicamento slo en las cajas y no directamente en los tubos del [place].   Si su medicamento es muy caro, por favor, pngase en contacto con Rolm Gala llamando al [phone] y presione la opcin 4 o envenos un mensaje a travs de Clinical cytogeneticist.   No podemos decirle cul ser su copago por los medicamentos por adelantado ya que esto es diferente dependiendo de la cobertura de su seguro. Sin embargo, es posible que podamos encontrar un medicamento sustituto a Audiological scientist un formulario para que el seguro cubra el medicamento que se considera necesario.   Si se requiere una autorizacin previa para que su compaa de seguros [place] su medicamento, por favor permtanos de 1 a 2 das hbiles para completar [address]  Los precios de los medicamentos varan con frecuencia dependiendo del Environmental consultant de dnde se surte la receta y alguna farmacias pueden ofrecer precios ms baratos.  El sitio web www.goodrx.com tiene cupones para medicamentos de Health and safety inspector. Los precios aqu no tienen en cuenta lo que podra costar con la ayuda del seguro (puede ser ms barato con su seguro), pero el sitio web puede darle el precio si no utiliz Tourist information centre manager.  - Puede imprimir el cupn correspondiente y llevarlo con su receta a la farmacia.  - Tambin puede pasar por nuestra oficina durante el horario de atencin regular y Education officer, museum una tarjeta de cupones de GoodRx.  - Si necesita que su receta se enve electrnicamente a una farmacia diferente, informe a nuestra oficina a travs de  MyChart de [place] o por telfono llamando al [phone] y presione la opcin 4.

## 2023-12-20 NOTE — Progress Notes (Signed)
Follow-Up Visit   Subjective  Nicholas Abbott is a 73 y.o. 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   The patient presents for Total-Body Skin Exam (TBSE) for skin cancer screening and mole check. The patient has spots, moles and lesions to be evaluated, some may be new or changing and the patient may have concern these could be cancer.  The following portions of the chart were reviewed this encounter and updated as appropriate: medications, allergies, medical history  Review of Systems:  No other skin or systemic complaints except as noted in HPI or Assessment and Plan.  Objective  Well appearing patient in no apparent distress; mood and affect are within normal limits.  A full examination was performed including scalp, head, eyes, ears, nose, lips, neck, chest, axillae, abdomen, back, buttocks, bilateral upper extremities, bilateral lower extremities, hands, feet, fingers, toes, fingernails, and toenails. All findings within normal limits unless otherwise noted below.   Relevant physical exam findings are noted in the Assessment and Plan.  scalp x 6 (6) Erythematous thin papules/macules with gritty scale.   Assessment & Plan   SKIN CANCER SCREENING PERFORMED TODAY.  Purpura - Chronic; persistent and recurrent.  Treatable, but not curable. At arms - Violaceous macules and patches - Benign - Related to trauma, age, sun damage and/or use of blood thinners, chronic use of topical and/or oral steroids - Observe - Can use OTC arnica containing moisturizer such as Dermend Bruise Formula if desired - Call for worsening or other concerns  Tiny laceration with oozing bc of blood thinners at right forearm  Exam: 1 mm oozing tiny laceration at right forearm  Treatment Plan: Applied pressure dressing to area. Patient instructed to keep covered with vaseline and bandage until  healed.  Call if not resolving.  Tinea cruris Groin; feet Exam : clear at exam Chronic condition with duration or expected duration over one year. Currently well-controlled.  Plan: Continue Ketoconazole 2 % cream qhs to affected areas.   Tinea pedis of both feet B/L foot Exam: clear today Plan:  Continue Ketoconazole 2% cream QHS to affected areas    LENTIGINES, SEBORRHEIC KERATOSES, HEMANGIOMAS - Benign normal skin lesions - Benign-appearing - Call for any changes  MELANOCYTIC NEVI - Tan-brown and/or pink-flesh-colored symmetric macules and papules - Benign appearing on exam today - Observation - Call clinic for new or changing moles - Recommend daily use of broad spectrum spf 30+ sunscreen to sun-exposed areas.   Psoriasis L palm, bil ankles; hands ; feet Plan : mostely clear today at exam BSA 1% Chronic and persistent condition with duration or expected duration over one year. Condition is improving with treatment but not currently at goal.  Counseling on psoriasis and coordination of care  psoriasis is a chronic non-curable, but treatable genetic/hereditary disease that may have other systemic features affecting other organ systems such as joints (Psoriatic Arthritis). It is associated with an increased risk of inflammatory bowel disease, heart disease, non-alcoholic fatty liver disease, and depression.  Treatments include light and laser treatments; topical medications; and systemic medications including oral and injectables.  Plan:  Continue Zoryve cr qd aa psoriasis prn flares, samples x 3 given, Lot MBBD 03/25   History of Basal Cell Carcinoma of the Skin - No evidence of recurrence today - Recommend regular full body skin exams - Recommend daily broad spectrum sunscreen SPF  30+ to sun-exposed areas, reapply every 2 hours as needed.  - Call if any new or changing lesions are noted between office visits  - L ear  ACTINIC KERATOSIS (6) scalp x 6 (6)  ACTINIC  DAMAGE WITH PRECANCEROUS ACTINIC KERATOSES Counseling for Topical Chemotherapy Management: Patient exhibits: - Severe, confluent actinic changes with pre-cancerous actinic keratoses that is secondary to cumulative UV radiation exposure over time - Condition that is severe; chronic, not at goal. - diffuse scaly erythematous macules and papules with underlying dyspigmentation - Discussed Prescription "Field Treatment" topical Chemotherapy for Severe, Chronic Confluent Actinic Changes with Pre-Cancerous Actinic Keratoses Field treatment involves treatment of an entire area of skin that has confluent Actinic Changes (Sun/ Ultraviolet light damage) and PreCancerous Actinic Keratoses by method of PhotoDynamic Therapy (PDT) and/or prescription Topical Chemotherapy agents such as 5-fluorouracil, 5-fluorouracil/calcipotriene, and/or imiquimod.  The purpose is to decrease the number of clinically evident and subclinical PreCancerous lesions to prevent progression to development of skin cancer by chemically destroying early precancer changes that may or may not be visible.  It has been shown to reduce the risk of developing skin cancer in the treated area. As a result of treatment, redness, scaling, crusting, and open sores may occur during treatment course. One or more than one of these methods may be used and may have to be used several times to control, suppress and eliminate the PreCancerous changes. Discussed treatment course, expected reaction, and possible side effects. - Recommend daily broad spectrum sunscreen SPF 30+ to sun-exposed areas, reapply every 2 hours as needed.  - Staying in the shade or wearing long sleeves, sun glasses (UVA+UVB protection) and wide brim hats (4-inch brim around the entire circumference of the hat) are also recommended. - Call for new or changing lesions.  - Start 5-fluorouracil/calcipotriene cream twice a day for 10 days to affected areas including scalp and forehead.  Prescription sent to [facility]. Patient advised they will receive an email to purchase the medication online and have it sent to their home. Patient provided with handout reviewing treatment course and side effects and advised to call or message [place] on MyChart with any concerns.  Reviewed course of treatment and expected reaction.  Patient advised to expect inflammation and crusting and advised that erosions are possible.  Patient advised to be diligent with sun protection during and after treatment. Counseled to keep medication out of reach of children and pets.  Actinic keratoses are precancerous spots that appear secondary to cumulative UV radiation exposure/sun exposure over time. They are chronic with expected duration over 1 year. A portion of actinic keratoses will progress to squamous cell carcinoma of the skin. It is not possible to reliably predict which spots will progress to skin cancer and so treatment is recommended to prevent development of skin cancer.  Recommend daily broad spectrum sunscreen SPF 30+ to sun-exposed areas, reapply every 2 hours as needed.  Recommend staying in the shade or wearing long sleeves, sun glasses (UVA+UVB protection) and wide brim hats (4-inch brim around the entire circumference of the hat). Call for new or changing lesions. Destruction of lesion - scalp x 6 (6) Complexity: simple   Destruction method: cryotherapy   Informed consent: discussed and consent obtained   Timeout:  patient name, date of birth, surgical site, and procedure verified Lesion destroyed using liquid nitrogen: Yes   Region frozen until ice ball extended beyond lesion: Yes   Outcome: patient tolerated procedure well with no complications   Post-procedure  details: wound care instructions given   TINEA CRURIS   Related Medications hydrocortisone 2.5 % cream APPLY TO THE GROIN DAILY ON MONDAY, WEDNESDAY, AND FRIDAY. ketoconazole (NIZORAL) 2 % cream APPLY  TO THE GROIN AT BEDTIME Return in about 1 year (around 12/19/2024) for TBSE.  IAsher Muir, CMA, am acting as scribe for Armida Sans, MD.   Documentation: I have reviewed the above documentation for accuracy and completeness, and I agree with the above.  Armida Sans, MD

## 2023-12-21 ENCOUNTER — Encounter: Payer: Self-pay | Admitting: Dermatology

## 2023-12-21 ENCOUNTER — Ambulatory Visit: Payer: Medicare Other | Admitting: Dermatology

## 2024-04-06 NOTE — Progress Notes (Signed)
 [name], [name] 04/06/2024 DOB: [birth year]  Age: 73 y.o. Clinic Note - Cardiovascular W. Dwain Molt, MD  PRIMARY CARE PROVIDER:  Sherial Bail, MD  Patient Profile:  Nicholas Abbott  is a 73 y.o.  male who was referred by Dr. Sherial for annual follow-up visit.   Problem List:    Patient Active Problem List   Diagnosis Date Noted  . Closed nondisplaced fracture of fifth cervical vertebra with routine healing 10/25/2023  . Pain in joint of right shoulder 10/25/2023  . CPAP use counseling 08/30/2023  . S/P angioplasty with stent 05/01/2022  . Acute metabolic encephalopathy 11/16/2019  . Cellulitis of right lower extremity 11/16/2019  . Hypoglycemia 11/16/2019  . Sepsis (CMS/HHS-HCC) 11/16/2019  . Thrombocytopenia () 11/16/2019  . Cellulitis 11/03/2019  . Left shoulder pain 06/03/2017  . Type 2 diabetes mellitus without complication, without long-term current use of insulin  (CMS/HHS-HCC) 02/11/2017  . Combined arterial insufficiency and corporo-venous occlusive erectile dysfunction 02/10/2017  . Coronary artery disease 06/06/2014  . Hypertension 06/06/2014  . Obesity 05/30/2012  . Hyperlipidemia   . GERD (gastroesophageal reflux disease)   . Sleep apnea   . HO Myocardial infarction 03/02/2000    Current Medications:   Current Outpatient Medications  Medication Sig Dispense Refill  . aspirin  81 MG EC tablet Take 81 mg by mouth daily.    . blood-glucose meter Misc 1 each    . blood-glucose sensor (FREESTYLE LIBRE 3 SENSOR) Devi USE AS DIRECTED 3 each 1  . cholecalciferol (VITAMIN D3) 1000 unit capsule Take 1,000 Units by mouth once daily    . cinnamon bark 500 mg capsule Take 1,000 mg by mouth 2 (two) times daily    . cyanocobalamin  (VITAMIN B12) 1000 MCG tablet Take 5,000 mcg by mouth once daily    . empagliflozin  (JARDIANCE ) 10 mg tablet Take 1 tablet (10 mg total) by mouth once daily 30 tablet 2  . FOLBIC 2.5-25-2 mg tablet Take 1 tablet by mouth once daily. 90  tablet 0  . hydrocortisone  2.5 % cream Apply 1 Application topically once daily as needed    . KELP ORAL Take 1 tablet by mouth once daily    . levothyroxine  (SYNTHROID ) 112 MCG tablet TAKE 1 TABLET ONCE DAILY ON EMPTY STOMACH WITH GLASS OF WATER AT LEAST 30-60 MINS BEFORE BREAKFAST. 90 tablet 3  . lisinopriL -hydroCHLOROthiazide  (ZESTORETIC ) 20-12.5 mg tablet TAKE 1 TABLET BY MOUTH EVERY DAY 90 tablet 1  . metoprolol  SUCCinate (TOPROL -XL) 25 MG XL tablet TAKE 1/2 TABLET BY MOUTH ONCE DAILY 45 tablet 1  . PHOSPHATIDYLSERINE ORAL Take 600 mg by mouth once daily    . rosuvastatin  (CRESTOR ) 40 MG tablet Take 1 tablet (40 mg total) by mouth once daily 90 tablet 3  . tadalafiL (CIALIS) 5 MG tablet TAKE 1 TABLET BY MOUTH EVERY DAY 90 tablet 0  . traMADoL  (ULTRAM ) 50 mg tablet TAKE 1 TABLET (50 MG TOTAL) BY MOUTH EVERY 8 (EIGHT) HOURS AS NEEDED FOR PAIN. 21 tablet 0  . vitamin E 400 UNIT capsule Take 400 Units by mouth once daily    . ezetimibe (ZETIA) 10 mg tablet Take 1 tablet (10 mg total) by mouth once daily 90 tablet 3   No current facility-administered medications for this visit.    ALLERGIES/Sensitivities:   Allergies  Allergen Reactions  . Actos [Pioglitazone] Rash    Systemic     Social History:   Social History   Socioeconomic History  . Marital status: Married  Spouse name: Verdell Dykman  . Number of children: 2  Occupational History  . Occupation: Comptroller  Tobacco Use  . Smoking status: Never    Passive exposure: Past  . Smokeless tobacco: Never  Vaping Use  . Vaping status: Never Used  Substance and Sexual Activity  . Alcohol use: Yes    Alcohol/week: 6.0 standard drinks of alcohol    Types: 1 Glasses of wine, 1 Cans of beer, 4 Shots of liquor per week    Comment: Socially  . Drug use: Never  . Sexual activity: Yes    Partners: Female    Birth control/protection: None  Social History Narrative   The patient is married, he lives with his wife.   His wife is a patient of this practice.  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  He denies the use of tobacco products.  He has 2 to 3 alcoholic beverages weekly.  Not physically active.   Social Drivers of Corporate investment banker Strain: Low Risk  (03/07/2024)   Overall Financial Resource Strain (CARDIA)   . Difficulty of Paying Living Expenses: Not hard at all  Food Insecurity: No Food Insecurity (03/07/2024)   Hunger Vital Sign   . Worried About Programme researcher, broadcasting/film/video in the Last Year: Never true   . Ran Out of Food in the Last Year: Never true  Transportation Needs: No Transportation Needs (03/07/2024)   PRAPARE - Transportation   . Lack of Transportation (Medical): No   . Lack of Transportation (Non-Medical): No  Housing Stability: Low Risk  (03/07/2024)   Housing Stability Vital Sign   . Unable to Pay for Housing in the Last Year: No   . Number of Times Moved in the Last Year: 0   . Homeless in the Last Year: No    Family History:   Family History  Problem Relation Name Age of Onset  . Coronary Artery Disease (Blocked arteries around heart) Mother Nicholas Abbott   . Leukemia Father    . Osteoarthritis Brother ed   . Obesity Brother ed     Interval History:  Nicholas Abbott  is a 73 y.o.  male who I met in June 2023 when Dr. Bernardino Cleveland referred him for LAD-D2 PCI. We did that successfully on May 01, 2022. He was largely asymptomatic before that procedure. He underwent routine stress testing every 2-3 years over the past 20 years. He had first PCI in 2002-2003 range. Over the summer, he has felt pretty good. He denies CP, SOB, DOE. Some more fatigue and exercise intolerance. At cardiac rehab, MET score is 4.1-4.5. Some PVCs - he thinks about 4-5 per minute. Discussed threshold to do something about this (10% burden with normal LVEF). Discussed Tadalafil and he can use them. He has not used Nitro at all.  Level of exertion assessed: doing ADLs,  graduating from cardiac rehab Takes meds as prescribed without difficulty.  Interval History (03/03/2023): Since his last visit 6 months ago, he has done well. He primarily has large ecchymoses that occur spontaneously and with minor trauma. Long discussion about risks/benefits of ischemic complications and bleeding/bruising. He denies CP, SOB, DOE. He continues to be active. Works out with a friend who has a Nurse, mental health type gym. Balance and strength have been better with this. 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 We prescribed Alirocumab but he has been unable to take it because his cost is $  565/month. A1c has been doing better -- around 6.0%. He pays $500 in January and $100 per month after for Jardiance .  Interval History (04/06/2024): Doing very well today; no specific concerns or complaints that he would like to address from CV perspective.  He does report ongoing fatigue and muscle cramping, which has progressed. Had electrolytes checked, which were WNL.  TSH recently elevated 10-12 uIU/mL. Stable synthroid  dose. Takes in AM 30-45 min prior to breakfast.  Compliant with all medications. No bleeding complications on DAPT. Interested in narrowing to ASA monotherapy if able.    Review of Systems:   Answers submitted by the patient for this visit: Recent Medical Symptoms (Submitted on 04/03/2024) Night sweats: Yes Cough: Yes Chest pain: No leg pain: Yes Frequency (The need to urinate many times a day): Yes Myalgias (Muscle pain): Yes  Objective:  BP 127/63 (BP Location: Left upper arm, Patient Position: Sitting, BP Cuff Size: Large Adult)   Pulse 53   Resp 17   Ht 188 cm (6' 2)   Wt (!) 112.9 kg (248 lb 12.8 oz)   SpO2 97%   BMI 31.94 kg/m  GEN: Miquel Maple Faden is a pleasant man in no acute distress.  NEURO: A/O, CN II-XII are grossly intact. LUNGS: CTAB CV: Normal S1,S2. RRR, No audible C/R/M. No JVD GI: Abdomen is soft, non-tender with normal bowel sounds.  No palpable/pulsatile masses.No iliac or visceral bruit INTEGUMENT: Intact. No lower extremity edema.        Accessory Clinical Findings:   Results for orders placed or performed in visit on 03/13/24  Large Joint Injection: R subacromial bursa   Narrative   Kip Lynwood Double, [place]     03/13/2024  1:19 PM Large Joint Injection: R subacromial bursa  Date/Time: 03/13/2024 1:15 PM  Performed by: Kip Lynwood Double, PA Authorized by: Kip Lynwood Double, [place]   Location:  Shoulder Site:  R subacromial bursa Topical skin anesthesia: obtained using ethyl chloride spray   Medications:  4 mL BUPivacaine HCl 0.5 %; 40 mg triamcinolone acetonide 40  mg/mL    Assessment / Plan:   1. CAD s/p PCI Successful PCI LAD-D2 with 2.75x28 DES dLAD, 3.0x24 DES mLAD. Posted with 3.0, 3.5, 4.0, 4.5 [place] balloons. Asymptomatic.  Recommendations: Discontinue Plavix  Continue ASA monotherapy  2. Hyperlipidemia Last Lipids: Lab Results  Component Value Date   CHOLTOTAL 202 (H) 09/02/2023   LDLCALC 124 09/02/2023   HDL 50.8 09/02/2023   TRIG 138 09/02/2023  Recommendations: Crestor  40 mg daily  Unable to afford [id number] Start EZE 10 QD  3. T2DM Lab Results  Component Value Date   HGBA1C 5.9 (H) 09/02/2023   HGBA1C 6.0 (H) 01/26/2023  Recommendations: Continue Jardiance   4. HTN Recommendations: BP log at home Continue Zestoretic  20-12.5  5. Fatigue TSH elevated on repeat checks. No recent changes in synthroid  dose per patient report. Will discuss with PCP.    Disposition:RTC 12 months  Seen with Dr. Rolan Pih Attestation Statement:   I personally saw and evaluated the patient, and participated in the management and treatment plan as documented in the resident/fellow note. I spent a total of 35 minutes in both face-to-face and non-face-to-face activities for this visit on the date of this encounter.   Dwain Molt, MD [facility] Professor of Medicine [facility]  WSJ--Agree with above. Doing well. Stop Plavix . Will intensify lipid lowering therapy.

## 2024-04-13 ENCOUNTER — Other Ambulatory Visit: Payer: Self-pay

## 2024-04-13 DIAGNOSIS — D696 Thrombocytopenia, unspecified: Secondary | ICD-10-CM

## 2024-04-13 NOTE — Addendum Note (Signed)
 Addended by: COOKE, Seaira Byus E on: 04/13/2024 02:30 PM   Modules accepted: Orders

## 2024-04-14 ENCOUNTER — Inpatient Hospital Stay: Payer: Medicare Other | Attending: Oncology

## 2024-04-14 DIAGNOSIS — Z7982 Long term (current) use of aspirin: Secondary | ICD-10-CM | POA: Insufficient documentation

## 2024-04-14 DIAGNOSIS — Z85828 Personal history of other malignant neoplasm of skin: Secondary | ICD-10-CM | POA: Diagnosis not present

## 2024-04-14 DIAGNOSIS — I10 Essential (primary) hypertension: Secondary | ICD-10-CM | POA: Insufficient documentation

## 2024-04-14 DIAGNOSIS — Z79899 Other long term (current) drug therapy: Secondary | ICD-10-CM | POA: Diagnosis not present

## 2024-04-14 DIAGNOSIS — I252 Old myocardial infarction: Secondary | ICD-10-CM | POA: Diagnosis not present

## 2024-04-14 DIAGNOSIS — D7589 Other specified diseases of blood and blood-forming organs: Secondary | ICD-10-CM | POA: Insufficient documentation

## 2024-04-14 DIAGNOSIS — D696 Thrombocytopenia, unspecified: Secondary | ICD-10-CM | POA: Insufficient documentation

## 2024-04-14 DIAGNOSIS — Z7902 Long term (current) use of antithrombotics/antiplatelets: Secondary | ICD-10-CM | POA: Diagnosis not present

## 2024-04-14 DIAGNOSIS — C9140 Hairy cell leukemia not having achieved remission: Secondary | ICD-10-CM | POA: Insufficient documentation

## 2024-04-14 LAB — CBC WITH DIFFERENTIAL/PLATELET
Abs Immature Granulocytes: 0.02 10*3/uL (ref 0.00–0.07)
Basophils Absolute: 0.1 10*3/uL (ref 0.0–0.1)
Basophils Relative: 1 %
Eosinophils Absolute: 0.4 10*3/uL (ref 0.0–0.5)
Eosinophils Relative: 4 %
HCT: 46.2 % (ref 39.0–52.0)
Hemoglobin: 15.9 g/dL (ref 13.0–17.0)
Immature Granulocytes: 0 %
Lymphocytes Relative: 65 %
Lymphs Abs: 6.8 10*3/uL — ABNORMAL HIGH (ref 0.7–4.0)
MCH: 34.8 pg — ABNORMAL HIGH (ref 26.0–34.0)
MCHC: 34.4 g/dL (ref 30.0–36.0)
MCV: 101.1 fL — ABNORMAL HIGH (ref 80.0–100.0)
Monocytes Absolute: 0.6 10*3/uL (ref 0.1–1.0)
Monocytes Relative: 6 %
Neutro Abs: 2.6 10*3/uL (ref 1.7–7.7)
Neutrophils Relative %: 24 %
Platelets: 101 10*3/uL — ABNORMAL LOW (ref 150–400)
RBC: 4.57 MIL/uL (ref 4.22–5.81)
RDW: 15.6 % — ABNORMAL HIGH (ref 11.5–15.5)
Smear Review: NORMAL
WBC: 10.5 10*3/uL (ref 4.0–10.5)
nRBC: 0 % (ref 0.0–0.2)

## 2024-04-19 LAB — COMP PANEL: LEUKEMIA/LYMPHOMA: Immunophenotypic Profile: 61

## 2024-04-21 ENCOUNTER — Inpatient Hospital Stay (HOSPITAL_BASED_OUTPATIENT_CLINIC_OR_DEPARTMENT_OTHER): Payer: Medicare Other | Admitting: Oncology

## 2024-04-21 DIAGNOSIS — D696 Thrombocytopenia, unspecified: Secondary | ICD-10-CM

## 2024-04-21 NOTE — Progress Notes (Signed)
 [facility]  Telephone:([phone]) [phone] [phone]) [phone]  ID: Nicholas Abbott OB: [birth year]  MR#: [id number]  AOZ#:[id number]  Patient Care Team: Rex Castor, MD as PCP - General (Internal Medicine)  I connected with Maudine Sos on 04/21/24 at 11:15 AM EDT by video enabled telemedicine visit and verified that I am speaking with the correct person using two identifiers.   I discussed the limitations, risks, security and privacy concerns of performing an evaluation and management service by telemedicine and the availability of in-person appointments. I also discussed with the patient that there may be a patient responsible charge related to this service. The patient expressed understanding and agreed to proceed.   Other persons participating in the visit and their role in the encounter: Patient, MD.  Patient's location: Home. Provider's location: Clinic.  CHIEF COMPLAINT: Thrombocytopenia, clonality noted on flow cytometry.  INTERVAL HISTORY: Patient agreed to video-assisted telemedicine visit for routine yearly evaluation and discussion of his laboratory results.  He continues to feel well and remains asymptomatic.  He has no neurologic complaints.  He denies any recent fevers or illnesses.  He has a good appetite and denies weight loss.  He has no chest pain, shortness of breath, cough, or hemoptysis.  He denies any nausea, vomiting, constipation, or diarrhea.  He has no urinary complaints.  Patient offers no specific complaints today.  REVIEW OF SYSTEMS:   Review of Systems  Constitutional: Negative.  Negative for fever, malaise/fatigue and weight loss.  Respiratory: Negative.  Negative for cough, hemoptysis and shortness of breath.   Cardiovascular: Negative.  Negative for chest pain and leg swelling.  Gastrointestinal: Negative.  Negative for abdominal pain.  Genitourinary: Negative.  Negative for dysuria.  Musculoskeletal:  Negative for back pain.   Skin: Negative.  Negative for rash.  Neurological: Negative.  Negative for dizziness, focal weakness, weakness and headaches.  Endo/Heme/Allergies:  Does not bruise/bleed easily.  Psychiatric/Behavioral: Negative.  The patient is not nervous/anxious.     As per HPI. Otherwise, a complete review of systems is negative.  PAST MEDICAL HISTORY: Past Medical History:  Diagnosis Date   Basal cell carcinoma    L ear, txted in past   Diabetes mellitus without complication (HCC)    GERD (gastroesophageal reflux disease)    Hyperlipidemia    Hypertension    Left shoulder pain    Myocardial infarction [facility])     PAST SURGICAL HISTORY: Past Surgical History:  Procedure Laterality Date   CATARACT EXTRACTION     COLONOSCOPY WITH PROPOFOL  N/A 01/01/2020   Procedure: COLONOSCOPY WITH PROPOFOL ;  Surgeon: [name], Alphonsus Jeans, MD;  Location: [facility] ENDOSCOPY;  Service: Gastroenterology;  Laterality: N/A;   CORONARY ANGIOPLASTY     ESOPHAGOGASTRODUODENOSCOPY (EGD) WITH PROPOFOL  N/A 01/01/2020   Procedure: ESOPHAGOGASTRODUODENOSCOPY (EGD) WITH PROPOFOL ;  Surgeon: [name], Alphonsus Jeans, MD;  Location: [facility] ENDOSCOPY;  Service: Gastroenterology;  Laterality: N/A;   EYE SURGERY     LEFT HEART CATH AND CORONARY ANGIOGRAPHY N/A 04/29/2022   Procedure: LEFT HEART CATH AND CORONARY ANGIOGRAPHY;  Surgeon: Link Rice, MD;  Location: [facility] INVASIVE CV LAB;  Service: Cardiovascular;  Laterality: N/A;   RETINAL DETACHMENT SURGERY      FAMILY HISTORY: Family History  Problem Relation Age of Onset   Leukemia Father     ADVANCED DIRECTIVES (Y/N):  N  HEALTH MAINTENANCE: Social History   Tobacco Use   Smoking status: Never   Smokeless tobacco: Never  Vaping Use   Vaping status: Never Used  Substance Use Topics   Alcohol use: Yes    Alcohol/week: 5.0 standard drinks of alcohol    Types: 5 Shots of liquor per week   Drug use: Never     Colonoscopy:  PAP:  Bone density:  Lipid panel:  Allergies   Allergen Reactions   Pioglitazone Rash    Current Outpatient Medications  Medication Sig Dispense Refill   acetaminophen  (TYLENOL ) 500 MG tablet Take 1,000 mg by mouth every 8 (eight) hours as needed.     aspirin  EC 81 MG tablet Take 81 mg by mouth daily.     Blood Glucose Monitoring Suppl DEVI 1 each by Does not apply route in the morning, at noon, and at bedtime. May substitute to any manufacturer covered by patient's insurance. 1 each 0   Cinnamon 500 MG capsule Take 1,000 mg by mouth daily.     clopidogrel  (PLAVIX ) 75 MG tablet Take 75 mg by mouth daily.     Cyanocobalamin  (VITAMIN B-12) 5000 MCG TBDP Take 5,000 mcg by mouth daily.      empagliflozin (JARDIANCE) 10 MG TABS tablet Take 1 tablet by mouth daily with breakfast.     folic acid -pyridoxine -cyancobalamin (FOLTX) 2.5-25-2 MG TABS tablet Take 1 tablet by mouth daily.     hydrocortisone  2.5 % cream APPLY TO THE GROIN DAILY ON MONDAY, WEDNESDAY, AND FRIDAY. 28 g 1   ketoconazole  (NIZORAL ) 2 % cream APPLY TO THE GROIN AT BEDTIME 60 g 11   levothyroxine  (SYNTHROID ) 112 MCG tablet Take 112 mcg by mouth daily.     lisinopril-hydrochlorothiazide (ZESTORETIC) 20-12.5 MG tablet Take 1 tablet by mouth daily.     metoprolol  succinate (TOPROL -XL) 25 MG 24 hr tablet Take 0.5 tablets (12.5 mg total) by mouth daily. 15 tablet 11   nitroGLYCERIN (NITROSTAT) 0.4 MG SL tablet Place 0.4 mg under the tongue every 5 (five) minutes as needed for chest pain.     PHOSPHATIDYLSERINE PO Take by mouth.     rosuvastatin (CRESTOR) 40 MG tablet Take by mouth. (Patient not taking: Reported on 04/16/2023)     tadalafil (CIALIS) 5 MG tablet Take 5 mg by mouth daily as needed for erectile dysfunction.     traMADol  (ULTRAM ) 50 MG tablet Take 50 mg by mouth daily as needed for severe pain or moderate pain.     No current facility-administered medications for this visit.    OBJECTIVE: There were no vitals filed for this visit.    There is no height or weight on  file to calculate BMI.    ECOG FS:0 - Asymptomatic  General: Well-developed, well-nourished, no acute distress. HEENT: Normocephalic. Neuro: Alert, answering all questions appropriately. Cranial nerves grossly intact. Psych: Normal affect.  LAB RESULTS:  Lab Results  Component Value Date   NA 141 01/22/2023   K 4.0 01/22/2023   CL 115 (H) 01/22/2023   CO2 20 (L) 01/22/2023   GLUCOSE 223 (H) 01/22/2023   BUN 20 01/22/2023   CREATININE 1.00 01/22/2023   CALCIUM 9.6 01/22/2023   PROT 6.3 (L) 01/28/2020   ALBUMIN 3.2 (L) 01/28/2020   AST 67 (H) 01/28/2020   ALT 63 (H) 01/28/2020   ALKPHOS 66 01/28/2020   BILITOT 2.7 (H) 01/28/2020   GFRNONAA >60 01/22/2023   GFRAA >60 01/31/2020    Lab Results  Component Value Date   WBC 10.5 04/14/2024   NEUTROABS 2.6 04/14/2024   HGB 15.9 04/14/2024   HCT 46.2 04/14/2024   MCV 101.1 (H) 04/14/2024   PLT 101 (  L) 04/14/2024   Lab Results  Component Value Date   IRON 109 09/10/2022   TIBC 242 (L) 09/10/2022   IRONPCTSAT 45 (H) 09/10/2022   Lab Results  Component Value Date   FERRITIN 301 09/10/2022     STUDIES: No results found.  ASSESSMENT: Thrombocytopenia.  PLAN:    Thrombocytopenia: Chronic and unchanged.  Upon review of patient's chart, his platelet count has been decreased since at least December 2020 ranging from 80-104.  His most recent count is 101.  Previously, abdominal ultrasound on February 17, 2022 revealed a mildly enlarged spleen of 13 cm which may be contributing.  Previously, all of his other laboratory work including platelet antibody profile, SPEP, iron stores, B12, and folate were either negative or within normal limits.  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  No intervention is needed.  Patient does not require bone marrow biopsy.  Return to clinic in 1 year with repeat laboratory work and video-assisted telemedicine visit.   Macrocytosis: Chronic and unchanged.  B12 and folate are  within normal limits.  Monitor. 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  The clinical significance of this is unclear.  Repeat flow cytometry continues to reveal a clonal population that has ranged from 24% to 61% of lymphocytes.  The CD103+, CD11+, CD25-, CD5- and CD10- population is not typical for CLL, mantle cell, or hairy cell leukemia.  The CD103 phenotype can be partially expressed and an atypical hairy cell leukemia.  No intervention is needed.  Continue to monitor on a yearly basis.  Return to clinic as above.  I provided 20 minutes of face-to-face video visit time during this encounter which included chart review, counseling, and coordination of care as documented above.   Patient expressed understanding and was in agreement with this plan. He also understands that He can call clinic at any time with any questions, concerns, or complaints.    Shellie Dials, MD   04/21/2024 11:10 AM

## 2024-05-04 NOTE — Progress Notes (Signed)
 Chief Complaint:   Chief Complaint  Patient presents with  . Follow-up    Subjective:   Nicholas Abbott is a 73 y.o. male in today for follow up today  History of Present Illness Nicholas Abbott is a 73 year old male who presents for follow-up of various medical concerns.  He has a history of liver function abnormalities, with a previous ultrasound in 2023 showing fatty liver disease.  Patient also has history of alcohol use.  Which he has significantly cut back down since the most recent labs. In the past labs, he always had only AST elevation.  But lately has noticed bilirubin, AST and ALT elevation. - He is scheduled to follow-up with gastroenterology in a couple of months.  He denies any abdominal pain symptoms, nausea or vomiting at this time.   He has experienced significant weight loss, approximately 10 pounds since his last visit, attributed to intentional lifestyle changes. He is actively working on eating less and better, and increasing physical activity to improve overall health and alleviate back pain. He eats smaller portions and has resumed seeing a chiropractor to manage back pain.  He experiences fatigue and reduced endurance, feeling 'out of gas' during activities. Previous blood tests were stable, and he has a history of thyroid issues with elevated TSH but low T3 levels. He is currently on levothyroxine .  His social history includes a less busy work schedule with reduced travel, allowing him to focus more on his health. He has been seeing a chiropractor for over 20 years and finds it beneficial for his back issues.   Current Outpatient Medications  Medication Sig Dispense Refill  . aspirin  81 MG EC tablet Take 81 mg by mouth daily.    . blood-glucose meter Misc 1 each    . blood-glucose sensor (FREESTYLE LIBRE 3 SENSOR) Devi USE AS DIRECTED 3 each 1  . cholecalciferol (VITAMIN D3) 1000 unit capsule Take 1,000 Units by mouth once daily    . cinnamon bark 500 mg capsule  Take 1,000 mg by mouth 2 (two) times daily    . cyanocobalamin  (VITAMIN B12) 1000 MCG tablet Take 5,000 mcg by mouth once daily    . empagliflozin  (JARDIANCE ) 10 mg tablet Take 1 tablet (10 mg total) by mouth once daily 30 tablet 2  . ezetimibe (ZETIA) 10 mg tablet Take 1 tablet (10 mg total) by mouth once daily 90 tablet 3  . FOLBIC 2.5-25-2 mg tablet Take 1 tablet by mouth once daily. 90 tablet 0  . hydrocortisone  2.5 % cream Apply 1 Application topically once daily as needed    . KELP ORAL Take 1 tablet by mouth once daily    . levothyroxine  (SYNTHROID ) 112 MCG tablet TAKE 1 TABLET ONCE DAILY ON EMPTY STOMACH WITH GLASS OF WATER AT LEAST 30-60 MINS BEFORE BREAKFAST. 90 tablet 3  . lisinopriL -hydroCHLOROthiazide  (ZESTORETIC ) 20-12.5 mg tablet TAKE 1 TABLET BY MOUTH EVERY DAY 90 tablet 1  . magnesium 250 mg Tab Take by mouth    . metoprolol  SUCCinate (TOPROL -XL) 25 MG XL tablet TAKE 1/2 TABLET BY MOUTH EVERY DAY 45 tablet 1  . PHOSPHATIDYLSERINE ORAL Take 600 mg by mouth once daily    . rosuvastatin  (CRESTOR ) 40 MG tablet Take 1 tablet (40 mg total) by mouth once daily 90 tablet 3  . tadalafiL (CIALIS) 5 MG tablet TAKE 1 TABLET BY MOUTH EVERY DAY 90 tablet 0  . traMADoL  (ULTRAM ) 50 mg tablet TAKE 1 TABLET (50 MG TOTAL) BY MOUTH  EVERY 8 (EIGHT) HOURS AS NEEDED FOR PAIN. 21 tablet 0  . vitamin E 400 UNIT capsule Take 400 Units by mouth once daily    . liothyronine  (CYTOMEL ) 5 MCG tablet Take 1 tablet (5 mcg total) by mouth once daily Take on an empty stomach with a glass of water at least 30-60 minutes before breakfast. 30 tablet 1   No current facility-administered medications for this visit.    Allergies as of 05/04/2024 - Reviewed 05/04/2024  Allergen Reaction Noted  . Actos [pioglitazone] Rash 01/18/2019    Past Medical History:  Diagnosis Date  . Cellulitis   . CHF (congestive heart failure) (CMS/HHS-HCC)   . Coronary artery disease    s/p pci  . GERD (gastroesophageal reflux  disease)   . History of cataract 20+ years ago  . Hyperlipidemia   . Hypertension   . Myocardial infarction (CMS/HHS-HCC) 03/2000  . Sleep apnea   . Thyroid disease   . Type 2 diabetes mellitus (CMS/HHS-HCC)     Past Surgical History:  Procedure Laterality Date  . CATARACT EXTRACTION Bilateral 2003  . COLONOSCOPY  04/26/2006   Adenomatous Polyp: CBF 04/2011; 4th Recall Ltr mailed 05/13/2017 (dw)  . COLONOSCOPY  01/01/2020   Diverticulosis/PHx CP/Repeat 45yrs/TKT  . EGD  01/01/2020   Gastritis/Cirrhosis of the liver/Repeat 93yrs/TKT  .  wisdom teeth surgery    . CORONARY ANGIOPLASTY     With stent placement  . Retina reattachment Right      Family History  Problem Relation Name Age of Onset  . Coronary Artery Disease (Blocked arteries around heart) Mother Naomie   . Leukemia Father    . Osteoarthritis Brother ed   . Obesity Brother ed     Social History:  reports that he has never smoked. He has been exposed to tobacco smoke. He has never used smokeless tobacco. He reports current alcohol use of about 6.0 standard drinks of alcohol per week. He reports that he does not use drugs.  Results for orders placed or performed in visit on 04/07/24  Hemoglobin A1C  Result Value Ref Range   Hemoglobin A1C 6.4 (H) 4.2 - 5.6 %   Average Blood Glucose (Calc) 137 mg/dL   Narrative   Normal Range:    4.2 - 5.6% Increased Risk:  5.7 - 6.4% Diabetes:        >= 6.5% Glycemic Control for adults with diabetes:  <7%    Comprehensive Metabolic Panel (CMP)  Result Value Ref Range   Glucose 111 (H) 70 - 110 mg/dL   Sodium 858 863 - 854 mmol/L   Potassium 4.7 3.6 - 5.1 mmol/L   Chloride 108 97 - 109 mmol/L   Carbon Dioxide (CO2) 30.0 22.0 - 32.0 mmol/L   Urea Nitrogen (BUN) 22 7 - 25 mg/dL   Creatinine 1.1 0.7 - 1.3 mg/dL   Glomerular Filtration Rate (eGFR) 70 >60 mL/min/1.73sq m   Calcium  9.3 8.7 - 10.3 mg/dL   AST  93 (H) 8 - 39 U/L   ALT  85 (H) 6 - 57 U/L   Alk Phos (alkaline  Phosphatase) 124 (H) 34 - 104 U/L   Albumin 3.3 (L) 3.5 - 4.8 g/dL   Bilirubin, Total 1.5 (H) 0.3 - 1.2 mg/dL   Protein, Total 6.3 6.1 - 7.9 g/dL   A/G Ratio 1.1 1.0 - 5.0 gm/dL  Thyroxine (T4), Free  Result Value Ref Range   Thyroxine, Free (FT4) 1.11 0.66 - 1.14 ng/dL   Narrative  This reference range applies to non-pregnant individuals 18 or older. Contact Laboratory for reference ranges for pregnant patients.  Biotin ingestion may interfere with free T4 and Total T3 tests. If the results are inconsistent with the TSH level, previous test results, or the clinical presentation, then consider biotin interference. If needed, order repeat testing after stopping biotin.  Thyroid Stimulating-Hormone (TSH)  Result Value Ref Range   Thyroid Stimulating Hormone (TSH) 9.240 (H) 0.450-5.330 uIU/ml uIU/mL  TgAb+Thyroglobulin,IMA or LCMS  Result Value Ref Range   Thyroblobuilin Antibody - [facility] <1.0 0.0 - 0.9 IU/mL   Narrative   Performed at:  [address] [employer] [address] [place], [place]  [id number] Lab Director: Frankey Sas MD, Phone:  [phone]  Reverse T3, Serum - [facility]  Result Value Ref Range   Reverse T3 - [facility] 41.4 (H) 9.2 - 24.1 ng/dL   Narrative   Test(s) 929864-Mzczmdz T3, Serum was developed and its performance characteristics determined by [facility]. It has not been cleared or approved by the Food and Drug Administration. Performed at:  [address], [place], [place]  [id number] Lab Director: Frankey Sas MD, Phone:  [phone]  Thyroid Peroxidase (TPO) Ab - [facility]  Result Value Ref Range   Thyroid Peroxidase Ab - [facility] 13 0 - 34 IU/mL   Narrative   Performed at:  [address] [place], [place]  [id number] Lab Director: Frankey Sas MD, Phone:  [phone]  Triiodothyronine (T3), Total  Result Value Ref Range   Triiodothyronine (T3), Total 59 (L) 87 - 178 ng/dL   Narrative   Biotin ingestion may  interfere with free T4 and Total T3 tests. If the results are inconsistent with the TSH level, previous test results, or the clinical presentation, then consider biotin interference. If needed, order repeat testing after stopping biotin.  Thyroglobulin by IMA  -[facility]  Result Value Ref Range   Thyroglobulin by IMA   -[facility] 141.3 (H) 1.4 - 29.2 ng/mL   Narrative   Performed at:  [address], [place], [place]  [id number] Lab Director: Frankey Sas MD, Phone:  [phone]      ROS:  General: No fever, chills or recent illness.  Intentional weight loss HEENT: No change in vision or hearing. No pain or difficulty with swallowing Respiratory: No cough or shortness of breath CV:  No chest pain or palpitations GI:  No pain, dyspepsia or change in bowel habits GU:  No dysuria, frequency, or hesitancy MSK:  Lower back pain with left sciatica-like symptoms which is improving Neurological: No headaches, changes in mental status, loss of sensation or strength  Psychological: No anxiety insomnia or depression  Objective:   Body mass index is 31.46 kg/m.  BP 122/68   Pulse 58   Ht 188 cm (6' 2)   Wt (!) 111.1 kg (245 lb)   SpO2 97%   BMI 31.46 kg/m   General: WD/WN male, in no acute distress HEENT: Pupils equal and round, EOMI. oral mucosa moist.  Oropharynx clear. Neck: supple, trachea midline; no thyromegaly Respiratory:clear to auscultation.  No dullness to percussion.  No use of accessory muscles. Cardiac:  Regular rate and rhythm without murmur, gallops, or rubs Abdominal:soft, nontender, positive bowel sounds.  No organomegaly. Musculoskeletal:  No clubbing, cyanosis or edema.  Full range of motion in upper and lower extremities bilaterally. Neuro: CN grossly intact.  No acute decrease in sensation in the upper and lower extremities bilaterally. Lymph: no  cervical or supraclavicular lymphadenopathy   Assessment/Plan:   Type 2 diabetes mellitus  without complication, without long-term current use of insulin  (CMS/HHS-HCC)  (primary encounter diagnosis) Elevated LFTs Hypothyroidism, acquired Pulmonary hypertension, unspecified (CMS/HHS-HCC) Mixed hyperlipidemia  Assessment & Plan   Elevated LFTs- -known history of alcohol use which she has cut down.   - Now he has elevation of AST ALT, with bilirubin and alk phos numbers as well. - Last liver ultrasound did show hepatic steatosis changes, he also has his gallbladder with a stone in but it did not at the time.  But he is not symptomatic. - Continue with the diet and lifestyle changes - Has follow-up with gastroenterology coming up - LFTs have been ordered along with autoimmune panel, GGT.  Might need repeat ultrasound of his liver as well.  2. Hypothyroidism - -symptomatic with feeling sluggish all the time.  Seen by cardiology rule out cardiac causes. - TSH is elevated and low T3 levels - Free T4 is been within normal limits - Continue Synthroid  - Add low-dose Cytomel  and monitor symptoms over the next couple of months.  Labs to be done again in 2 months   3. 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  Occasional sciatica-like symptoms. - Following with chiropractor with improvement in symptoms.   4.  Hypertension- - Blood pressure is well-controlled - Continue low-salt diet - On metoprolol , lisinopril  hydrochlorothiazide .      Follow-up in 3 months for follow-up of thyroid issues, elevated LFTs and fatigue      Goals     . * Lose Weight (pt-stated)    . * Patient Goals (pt-stated)      Maintain diabetes with healthy eating and exercise.         Nicholas BEAVER, MD  Portions of this note were created using dictation software and may contain typographical errors.  *Some images could not be shown.

## 2024-06-08 ENCOUNTER — Inpatient Hospital Stay
Admission: EM | Admit: 2024-06-08 | Discharge: 2024-06-21 | DRG: 853 | Disposition: A | Discharge status: Home / Self Care | Attending: Internal Medicine | Admitting: Internal Medicine

## 2024-06-08 ENCOUNTER — Emergency Department

## 2024-06-08 ENCOUNTER — Encounter: Payer: Self-pay | Admitting: Emergency Medicine

## 2024-06-08 ENCOUNTER — Observation Stay

## 2024-06-08 ENCOUNTER — Other Ambulatory Visit: Payer: Self-pay

## 2024-06-08 DIAGNOSIS — C220 Liver cell carcinoma: Secondary | ICD-10-CM | POA: Diagnosis present

## 2024-06-08 DIAGNOSIS — A419 Sepsis, unspecified organism: Secondary | ICD-10-CM | POA: Diagnosis not present

## 2024-06-08 DIAGNOSIS — Z85828 Personal history of other malignant neoplasm of skin: Secondary | ICD-10-CM

## 2024-06-08 DIAGNOSIS — I493 Ventricular premature depolarization: Secondary | ICD-10-CM | POA: Diagnosis present

## 2024-06-08 DIAGNOSIS — K7460 Unspecified cirrhosis of liver: Secondary | ICD-10-CM | POA: Diagnosis present

## 2024-06-08 DIAGNOSIS — N179 Acute kidney failure, unspecified: Secondary | ICD-10-CM | POA: Diagnosis present

## 2024-06-08 DIAGNOSIS — I10 Essential (primary) hypertension: Secondary | ICD-10-CM | POA: Diagnosis present

## 2024-06-08 DIAGNOSIS — E785 Hyperlipidemia, unspecified: Secondary | ICD-10-CM | POA: Diagnosis present

## 2024-06-08 DIAGNOSIS — K838 Other specified diseases of biliary tract: Secondary | ICD-10-CM

## 2024-06-08 DIAGNOSIS — K839 Disease of biliary tract, unspecified: Secondary | ICD-10-CM | POA: Diagnosis not present

## 2024-06-08 DIAGNOSIS — Z79899 Other long term (current) drug therapy: Secondary | ICD-10-CM

## 2024-06-08 DIAGNOSIS — Z955 Presence of coronary angioplasty implant and graft: Secondary | ICD-10-CM

## 2024-06-08 DIAGNOSIS — E66811 Obesity, class 1: Secondary | ICD-10-CM | POA: Diagnosis present

## 2024-06-08 DIAGNOSIS — I2510 Atherosclerotic heart disease of native coronary artery without angina pectoris: Secondary | ICD-10-CM | POA: Diagnosis present

## 2024-06-08 DIAGNOSIS — R160 Hepatomegaly, not elsewhere classified: Secondary | ICD-10-CM | POA: Diagnosis present

## 2024-06-08 DIAGNOSIS — R109 Unspecified abdominal pain: Secondary | ICD-10-CM | POA: Insufficient documentation

## 2024-06-08 DIAGNOSIS — Z7902 Long term (current) use of antithrombotics/antiplatelets: Secondary | ICD-10-CM

## 2024-06-08 DIAGNOSIS — Z888 Allergy status to other drugs, medicaments and biological substances status: Secondary | ICD-10-CM

## 2024-06-08 DIAGNOSIS — K219 Gastro-esophageal reflux disease without esophagitis: Secondary | ICD-10-CM | POA: Diagnosis present

## 2024-06-08 DIAGNOSIS — R001 Bradycardia, unspecified: Secondary | ICD-10-CM | POA: Diagnosis present

## 2024-06-08 DIAGNOSIS — D696 Thrombocytopenia, unspecified: Secondary | ICD-10-CM | POA: Diagnosis present

## 2024-06-08 DIAGNOSIS — E669 Obesity, unspecified: Secondary | ICD-10-CM | POA: Diagnosis present

## 2024-06-08 DIAGNOSIS — G4733 Obstructive sleep apnea (adult) (pediatric): Secondary | ICD-10-CM | POA: Diagnosis present

## 2024-06-08 DIAGNOSIS — E119 Type 2 diabetes mellitus without complications: Secondary | ICD-10-CM

## 2024-06-08 DIAGNOSIS — R1084 Generalized abdominal pain: Secondary | ICD-10-CM | POA: Diagnosis not present

## 2024-06-08 DIAGNOSIS — M25511 Pain in right shoulder: Secondary | ICD-10-CM | POA: Diagnosis not present

## 2024-06-08 DIAGNOSIS — E875 Hyperkalemia: Secondary | ICD-10-CM | POA: Diagnosis present

## 2024-06-08 DIAGNOSIS — Z7982 Long term (current) use of aspirin: Secondary | ICD-10-CM

## 2024-06-08 DIAGNOSIS — R161 Splenomegaly, not elsewhere classified: Secondary | ICD-10-CM | POA: Diagnosis present

## 2024-06-08 DIAGNOSIS — R6520 Severe sepsis without septic shock: Secondary | ICD-10-CM | POA: Diagnosis present

## 2024-06-08 DIAGNOSIS — R079 Chest pain, unspecified: Principal | ICD-10-CM

## 2024-06-08 DIAGNOSIS — Z7984 Long term (current) use of oral hypoglycemic drugs: Secondary | ICD-10-CM

## 2024-06-08 DIAGNOSIS — Z6832 Body mass index (BMI) 32.0-32.9, adult: Secondary | ICD-10-CM

## 2024-06-08 DIAGNOSIS — I81 Portal vein thrombosis: Secondary | ICD-10-CM | POA: Diagnosis present

## 2024-06-08 DIAGNOSIS — I7121 Aneurysm of the ascending aorta, without rupture: Secondary | ICD-10-CM | POA: Diagnosis present

## 2024-06-08 DIAGNOSIS — R188 Other ascites: Secondary | ICD-10-CM | POA: Diagnosis present

## 2024-06-08 DIAGNOSIS — M25512 Pain in left shoulder: Secondary | ICD-10-CM | POA: Diagnosis not present

## 2024-06-08 DIAGNOSIS — I252 Old myocardial infarction: Secondary | ICD-10-CM

## 2024-06-08 DIAGNOSIS — E039 Hypothyroidism, unspecified: Secondary | ICD-10-CM | POA: Diagnosis present

## 2024-06-08 DIAGNOSIS — K8000 Calculus of gallbladder with acute cholecystitis without obstruction: Secondary | ICD-10-CM | POA: Diagnosis present

## 2024-06-08 DIAGNOSIS — Z79890 Hormone replacement therapy (postmenopausal): Secondary | ICD-10-CM

## 2024-06-08 DIAGNOSIS — N5202 Corporo-venous occlusive erectile dysfunction: Secondary | ICD-10-CM | POA: Diagnosis present

## 2024-06-08 LAB — CBC WITH DIFFERENTIAL/PLATELET
Abs Immature Granulocytes: 0.03 K/uL (ref 0.00–0.07)
Basophils Absolute: 0.1 K/uL (ref 0.0–0.1)
Basophils Relative: 1 %
Eosinophils Absolute: 0.2 K/uL (ref 0.0–0.5)
Eosinophils Relative: 2 %
HCT: 44.9 % (ref 39.0–52.0)
Hemoglobin: 15.4 g/dL (ref 13.0–17.0)
Immature Granulocytes: 0 %
Lymphocytes Relative: 59 %
Lymphs Abs: 7.3 K/uL — ABNORMAL HIGH (ref 0.7–4.0)
MCH: 34.7 pg — ABNORMAL HIGH (ref 26.0–34.0)
MCHC: 34.3 g/dL (ref 30.0–36.0)
MCV: 101.1 fL — ABNORMAL HIGH (ref 80.0–100.0)
Monocytes Absolute: 0.8 K/uL (ref 0.1–1.0)
Monocytes Relative: 6 %
Neutro Abs: 4 K/uL (ref 1.7–7.7)
Neutrophils Relative %: 32 %
Platelets: 91 K/uL — ABNORMAL LOW (ref 150–400)
RBC: 4.44 MIL/uL (ref 4.22–5.81)
RDW: 15.9 % — ABNORMAL HIGH (ref 11.5–15.5)
Smear Review: NORMAL
WBC: 12.4 K/uL — ABNORMAL HIGH (ref 4.0–10.5)
nRBC: 0 % (ref 0.0–0.2)

## 2024-06-08 LAB — COMPREHENSIVE METABOLIC PANEL WITH GFR
ALT: 95 U/L — ABNORMAL HIGH (ref 0–44)
AST: 119 U/L — ABNORMAL HIGH (ref 15–41)
Albumin: 3 g/dL — ABNORMAL LOW (ref 3.5–5.0)
Alkaline Phosphatase: 92 U/L (ref 38–126)
Anion gap: 13 (ref 5–15)
BUN: 20 mg/dL (ref 8–23)
CO2: 21 mmol/L — ABNORMAL LOW (ref 22–32)
Calcium: 9.5 mg/dL (ref 8.9–10.3)
Chloride: 104 mmol/L (ref 98–111)
Creatinine, Ser: 0.98 mg/dL (ref 0.61–1.24)
GFR, Estimated: >60 mL/min (ref >60)
Glucose, Bld: 152 mg/dL — ABNORMAL HIGH (ref 70–99)
Potassium: 4.8 mmol/L (ref 3.5–5.1)
Sodium: 138 mmol/L (ref 135–145)
Total Bilirubin: 2 mg/dL — ABNORMAL HIGH (ref 0.0–1.2)
Total Protein: 6.3 g/dL — ABNORMAL LOW (ref 6.5–8.1)

## 2024-06-08 LAB — URINE DRUG SCREEN, QUALITATIVE (ARMC ONLY)
Amphetamines, Ur Screen: NOT DETECTED
Barbiturates, Ur Screen: NOT DETECTED
Benzodiazepine, Ur Scrn: NOT DETECTED
Cannabinoid 50 Ng, Ur ~~LOC~~: NOT DETECTED
Cocaine Metabolite,Ur ~~LOC~~: NOT DETECTED
MDMA (Ecstasy)Ur Screen: NOT DETECTED
Methadone Scn, Ur: NOT DETECTED
Opiate, Ur Screen: NOT DETECTED
Phencyclidine (PCP) Ur S: NOT DETECTED
Tricyclic, Ur Screen: NOT DETECTED

## 2024-06-08 LAB — URINALYSIS, ROUTINE W REFLEX MICROSCOPIC
Bilirubin Urine: NEGATIVE
Glucose, UA: >=500 mg/dL — AB
Ketones, ur: 5 mg/dL — AB
Leukocytes,Ua: NEGATIVE
Nitrite: NEGATIVE
Protein, ur: NEGATIVE mg/dL
Specific Gravity, Urine: >1.046 — ABNORMAL HIGH (ref 1.005–1.030)
Squamous Epithelial / HPF: 0 /HPF (ref 0–5)
pH: 5 (ref 5.0–8.0)

## 2024-06-08 LAB — LIPASE, BLOOD: Lipase: 48 U/L (ref 11–51)

## 2024-06-08 LAB — TROPONIN I (HIGH SENSITIVITY)
Troponin I (High Sensitivity): 11 ng/L (ref <18)
Troponin I (High Sensitivity): 11 ng/L (ref <18)

## 2024-06-08 LAB — MAGNESIUM: Magnesium: 1.9 mg/dL (ref 1.7–2.4)

## 2024-06-08 LAB — TSH: TSH: 5.763 u[IU]/mL — ABNORMAL HIGH (ref 0.350–4.500)

## 2024-06-08 LAB — PATHOLOGIST SMEAR REVIEW

## 2024-06-08 LAB — PROTIME-INR
INR: 1.1 (ref 0.8–1.2)
Prothrombin Time: 15 s (ref 11.4–15.2)

## 2024-06-08 LAB — I-STAT CREATININE, ED: Creatinine, Ser: 0.9 mg/dL (ref 0.61–1.24)

## 2024-06-08 MED ORDER — CLOPIDOGREL BISULFATE 75 MG PO TABS: 75.0000 mg | ORAL_TABLET | Freq: Every day | ORAL | Status: DC

## 2024-06-08 MED ORDER — HYDROCHLOROTHIAZIDE 12.5 MG PO TABS
12.5000 mg | ORAL_TABLET | Freq: Every day | ORAL | Status: DC
  Administered 2024-06-08: 12.5 mg via ORAL
  Filled 2024-06-08: qty 1

## 2024-06-08 MED ORDER — LEVOTHYROXINE SODIUM 112 MCG PO TABS
112.0000 ug | ORAL_TABLET | Freq: Every day | ORAL | Status: DC
  Administered 2024-06-09 – 2024-06-21 (×15): 112 ug via ORAL
  Filled 2024-06-08 (×13): qty 1

## 2024-06-08 MED ORDER — LISINOPRIL 20 MG PO TABS
20.0000 mg | ORAL_TABLET | Freq: Every day | ORAL | Status: DC
  Administered 2024-06-08: 20 mg via ORAL
  Filled 2024-06-08: qty 1

## 2024-06-08 MED ORDER — HYDROMORPHONE HCL 1 MG/ML IJ SOLN
0.5000 mg | INTRAMUSCULAR | Status: DC | PRN
  Administered 2024-06-08: 1 mg via INTRAVENOUS
  Filled 2024-06-08: qty 1

## 2024-06-08 MED ORDER — NITROGLYCERIN 0.4 MG SL SUBL: 0.4000 mg | SUBLINGUAL_TABLET | SUBLINGUAL | Status: DC | PRN

## 2024-06-08 MED ORDER — ONDANSETRON HCL 4 MG PO TABS
4.0000 mg | ORAL_TABLET | Freq: Four times a day (QID) | ORAL | Status: DC | PRN
  Administered 2024-06-08: 4 mg via ORAL
  Filled 2024-06-08: qty 1

## 2024-06-08 MED ORDER — HYDROMORPHONE HCL 1 MG/ML IJ SOLN
1.0000 mg | Freq: Once | INTRAMUSCULAR | Status: AC
  Administered 2024-06-08: 1 mg via INTRAVENOUS
  Filled 2024-06-08: qty 1

## 2024-06-08 MED ORDER — ONDANSETRON HCL 4 MG/2ML IJ SOLN: 4.0000 mg | Freq: Four times a day (QID) | INTRAMUSCULAR | Status: DC | PRN

## 2024-06-08 MED ORDER — ONDANSETRON HCL 4 MG/2ML IJ SOLN
4.0000 mg | Freq: Once | INTRAMUSCULAR | Status: AC
  Administered 2024-06-08: 4 mg via INTRAVENOUS
  Filled 2024-06-08: qty 2

## 2024-06-08 MED ORDER — LACTATED RINGERS IV BOLUS
1000.0000 mL | Freq: Once | INTRAVENOUS | Status: AC
  Administered 2024-06-08: 1000 mL via INTRAVENOUS

## 2024-06-08 MED ORDER — LIDOCAINE 5 % EX PTCH
2.0000 | MEDICATED_PATCH | CUTANEOUS | Status: DC
  Administered 2024-06-08 – 2024-06-12 (×4): 2 via TRANSDERMAL
  Filled 2024-06-08 (×8): qty 2

## 2024-06-08 MED ORDER — FENTANYL CITRATE PF 50 MCG/ML IJ SOSY
50.0000 ug | PREFILLED_SYRINGE | Freq: Once | INTRAMUSCULAR | Status: AC
  Administered 2024-06-08: 50 ug via INTRAVENOUS
  Filled 2024-06-08: qty 1

## 2024-06-08 MED ORDER — METOPROLOL SUCCINATE ER 25 MG PO TB24
12.5000 mg | ORAL_TABLET | Freq: Every day | ORAL | Status: DC
  Administered 2024-06-12 – 2024-06-15 (×7): 12.5 mg via ORAL
  Filled 2024-06-08: qty 1
  Filled 2024-06-08: qty 0.5
  Filled 2024-06-08 (×2): qty 1
  Filled 2024-06-08: qty 0.5

## 2024-06-08 MED ORDER — IOHEXOL 350 MG/ML SOLN
100.0000 mL | Freq: Once | INTRAVENOUS | Status: AC | PRN
  Administered 2024-06-08: 100 mL via INTRAVENOUS

## 2024-06-08 MED ORDER — ROSUVASTATIN CALCIUM 10 MG PO TABS: 40.0000 mg | ORAL_TABLET | Freq: Every day | ORAL | Status: DC

## 2024-06-08 MED ORDER — LISINOPRIL-HYDROCHLOROTHIAZIDE 20-12.5 MG PO TABS: 1.0000 | ORAL_TABLET | Freq: Every day | ORAL | Status: DC

## 2024-06-08 MED ORDER — TRAMADOL HCL 50 MG PO TABS
50.0000 mg | ORAL_TABLET | Freq: Every day | ORAL | Status: DC | PRN
  Administered 2024-06-09: 50 mg via ORAL
  Filled 2024-06-08: qty 1

## 2024-06-08 MED ORDER — ACETAMINOPHEN 325 MG PO TABS
650.0000 mg | ORAL_TABLET | Freq: Four times a day (QID) | ORAL | Status: DC | PRN
  Administered 2024-06-08: 650 mg via ORAL
  Filled 2024-06-08: qty 2

## 2024-06-08 MED ORDER — ASPIRIN 81 MG PO TBEC
81.0000 mg | DELAYED_RELEASE_TABLET | Freq: Every day | ORAL | Status: DC
  Administered 2024-06-08 – 2024-06-09 (×2): 81 mg via ORAL
  Filled 2024-06-08 (×2): qty 1

## 2024-06-08 NOTE — Consult Note (Addendum)
 [facility] CLINIC CARDIOLOGY CONSULT NOTE       Patient ID: Nicholas Abbott MRN: [id number] DOB/AGE: 74-Dec-1952 74 y.o.  Admit date: 06/08/2024 Referring Physician Dr. Laurita Primary Physician Sherial Bail, MD Primary Cardiologist Dr. Dwain [facility]) Reason for Consultation back pain, significant CAD hx  HPI: Nicholas Abbott is a 73 y.o. male  with a past medical history of coronary artery disease s/p stent to LAD-D2 (04/2022), hypertension, hyperlipidemia, hx bradycardia, obesity, OSA, T2DM, liver cirrhosis, chronic thrombocytopenia who presented to the ED on 06/08/2024 for acute onset abdominal pain and upper back pain. Cardiology was consulted for further evaluation.   Work up in the ED notable for Na 138, K 4.8, Cr 0.98, hgb 15.4, plts 91. EKG with sinus bradycardia, rate 43 bpm without acute ischemic changes. Trops negative x2. TSH elevated at 5.7. CTA negative for acute aortic syndrome.  Patient has received 2X of IV 1 mg Dilaudid , IV 4 mg Zofran , IV fluids.  At the time of my evaluation this afternoon, patient was resting comfortably in hospital bed.  We discussed patient's symptoms in further detail.  Patient states early this morning around 3 AM he had acute onset generalized abdominal pain with upper back pain in between his shoulder blades.  Patient endorses nausea. Patient describes his back pain as a sharp feeling.  Patient states his overall pain earlier this morning was a 9/10 and currently it is now sitting around a 6/10.  Patient states his back pain is worse when he lays down and worsens with certain movements.  Patient states he took some Pepto-Bismol which provided some relief but was later the pain came back.  Patient denies any chest pain, SOB, lower extremity swelling, lightheadedness or syncope.  Patient states able to perform all ADLs yesterday afternoon he did a bunch of chores around the house and had no concerning symptoms.  Patient states when he had his  stent placed in 2023 he had mild chest pain at that time with more severe but similar back pain with diaphoresis.  Patient states ever since his stents he has had no cardiac issues.  Patient states he recently saw his primary cardiologist about 2 months ago and had no concerns at that time.  Pertinent Cardiac History (Most recent) LHC 04/2022 Single vessel coronary artery disease.  Successful PCI of LAD-D2   Initial plan was to do 2 stent technique for LAD-D2 bifurcation lesion, with atherectomy for moderate-severe calcification of mid LAD  Unfortunately, prior stent from 2001 covered/jailed ostium of D2, prohibiting atherectomy  EBU3.5 guide  RunThrough and BMW wires placed  UFH 9000 units  2.5 mm PTCA balloon x 3 (2 in the LAD, 1 in D2)  D2 looked patent and without flow limiting disease after angioplasty  IVUS used to optimize LAD PCI  2.75 x 28-mm Synergy DES in distal LAD  3.0 x 24-mm Synergy DES in mid LAD  Post-dilated with 3.0 mm, 3.5 mm, 4.0 mm and 4.5 mm [place] balloons  D2 had normal flow at the end of the procedure  Excellent angiographic result   Review of systems complete and found to be negative unless listed above    Past Medical History:  Diagnosis Date   Basal cell carcinoma    L ear, txted in past   Diabetes mellitus without complication (HCC)    GERD (gastroesophageal reflux disease)    Hyperlipidemia    Hypertension    Left shoulder pain    Myocardial infarction (HCC)  Past Surgical History:  Procedure Laterality Date   CATARACT EXTRACTION     COLONOSCOPY WITH PROPOFOL  N/A 01/01/2020   Procedure: COLONOSCOPY WITH PROPOFOL ;  Surgeon: [name], [name], MD;  Location: [facility] ENDOSCOPY;  Service: Gastroenterology;  Laterality: N/A;   CORONARY ANGIOPLASTY     ESOPHAGOGASTRODUODENOSCOPY (EGD) WITH PROPOFOL  N/A 01/01/2020   Procedure: ESOPHAGOGASTRODUODENOSCOPY (EGD) WITH PROPOFOL ;  Surgeon: [name], [name], MD;  Location: [facility] ENDOSCOPY;  Service:  Gastroenterology;  Laterality: N/A;   EYE SURGERY     LEFT HEART CATH AND CORONARY ANGIOGRAPHY N/A 04/29/2022   Procedure: LEFT HEART CATH AND CORONARY ANGIOGRAPHY;  Surgeon: [name], MD;  Location: [facility] INVASIVE CV LAB;  Service: Cardiovascular;  Laterality: N/A;   RETINAL DETACHMENT SURGERY      (Not in a hospital admission)  Social History   Socioeconomic History   Marital status: Married    Spouse name: Not on file   Number of children: Not on file   Years of education: Not on file   Highest education level: Not on file  Occupational History   Not on file  Tobacco Use   Smoking status: Never   Smokeless tobacco: Never  Vaping Use   Vaping status: Never Used  Substance and Sexual Activity   Alcohol use: Yes    Alcohol/week: 5.0 standard drinks of alcohol    Types: 5 Shots of liquor per week   Drug use: Never   Sexual activity: Yes    Birth control/protection: Post-menopausal  Other Topics Concern   Not on file  Social History Narrative   Not on file   Social Drivers of Health   Financial Resource Strain: Low Risk  (03/07/2024)   Received from [facility]   Overall Financial Resource Strain (CARDIA)    Difficulty of Paying Living Expenses: Not hard at all  Food Insecurity: No Food Insecurity (03/07/2024)   Received from [facility]   Hunger Vital Sign    Within the past 12 months, you worried that your food would run out before you got the money to buy more.: Never true    Within the past 12 months, the food you bought just didn't last and you didn't have money to get more.: Never true  Transportation Needs: No Transportation Needs (03/07/2024)   Received from [facility] - Transportation    In the past 12 months, has lack of transportation kept you from medical appointments or from getting medications?: No    Lack of Transportation (Non-Medical): No  Physical Activity: Not on file  Stress: Not  on file  Social Connections: Not on file  Intimate Partner Violence: Not on file    Family History  Problem Relation Age of Onset   Leukemia Father      Vitals:   06/08/24 0730 06/08/24 0830 06/08/24 1200 06/08/24 1216  BP: (!) 112/48 134/67 (!) 143/78   Pulse: (!) 44 (!) 53 60   Resp: 18 15 13    Temp:    98.1 F (36.7 C)  TempSrc:    Oral  SpO2: 90% 94% 97%   Weight:      Height:        PHYSICAL EXAM General: Well-appearing elderly male, well nourished, in no acute distress. HEENT: Normocephalic and atraumatic. Neck: No JVD.   Lungs: Normal respiratory effort on room air. Clear bilaterally to auscultation. No wheezes, crackles, rhonchi.  Heart: HRRR. Normal S1 and S2 without gallops or murmurs.  Abdomen: Non-distended appearing.  Msk: Normal strength and tone for age. Extremities: Warm and well perfused. No clubbing, cyanosis, edema.  Neuro: Alert and oriented X 3. Psych: Answers questions appropriately.   Labs: Basic Metabolic Panel: Recent Labs    06/08/24 0711 06/08/24 0810  NA 138  --   K 4.8  --   CL 104  --   CO2 21*  --   GLUCOSE 152*  --   BUN 20  --   CREATININE 0.98 0.90  CALCIUM  9.5  --   MG 1.9  --    Liver Function Tests: Recent Labs    06/08/24 0711  AST 119*  ALT 95*  ALKPHOS 92  BILITOT 2.0*  PROT 6.3*  ALBUMIN 3.0*   Recent Labs    06/08/24 0711  LIPASE 48   CBC: Recent Labs    06/08/24 0711  WBC 12.4*  NEUTROABS 4.0  HGB 15.4  HCT 44.9  MCV 101.1*  PLT 91*   Cardiac Enzymes: Recent Labs    06/08/24 0711 06/08/24 1104  TROPONINIHS 11 11   BNP: No results for input(s): BNP in the last 72 hours. D-Dimer: No results for input(s): DDIMER in the last 72 hours. Hemoglobin A1C: No results for input(s): HGBA1C in the last 72 hours. Fasting Lipid Panel: No results for input(s): CHOL, HDL, LDLCALC, TRIG, CHOLHDL, LDLDIRECT in the last 72 hours. Thyroid Function Tests: Recent Labs    06/08/24 0711   TSH 5.763*   Anemia Panel: No results for input(s): VITAMINB12, FOLATE, FERRITIN, TIBC, IRON, RETICCTPCT in the last 72 hours.   Radiology: CT Angio Chest/Abd/Pel for Dissection W and/or Wo Contrast Result Date: 06/08/2024 EXAM: CTA CHEST, ABDOMEN AND PELVIS WITH AND WITHOUT CONTRAST 06/08/2024 08:19:56 AM TECHNIQUE: CTA of the chest was performed with and without the administration of intravenous contrast. CTA of the abdomen and pelvis was performed with and without the administration of intravenous contrast. Multiplanar reformatted images are provided for review. MIP images are provided for review. Automated exposure control, iterative reconstruction, and/or weight based adjustment of the mA/kV was utilized to reduce the radiation dose to as low as reasonably achievable. COMPARISON: None available. CLINICAL HISTORY: Acute aortic syndrome (AAS) suspected. Patient ambulatory to triage with steady gait, without difficulty or distress noted; pt reports awakening at 3am with generalized abd pain accomp by N/V; denies hx of same; Hx of heart stents. 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 There is moderate calcific plaque present throughout the thoracic aorta. The descending thoracic aorta is tortuous. The proximal descending thoracic aorta measures approximately 3.5 cm in diameter and the distal descending thoracic aorta approximately 3.1 cm in diameter. There is no evidence of acute aortic syndrome. PULMONARY ARTERIES: No pulmonary embolism with the limits of this exam. GREAT VESSELS OF AORTIC ARCH: No acute finding. No dissection. No arterial occlusion or significant stenosis. CELIAC TRUNK: No acute finding. No occlusion or significant stenosis. SUPERIOR MESENTERIC ARTERY: No acute finding. No occlusion or significant stenosis. The mesenteric arteries are  widely patent. INFERIOR MESENTERIC ARTERY: No acute finding. No occlusion or significant stenosis. RENAL ARTERIES: There is mild stenosis of the origins of the renal arteries bilaterally, worse on the right, which is approximately 50% stenotic. 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 CHEST: MEDIASTINUM: The heart is  mildly enlarged and there is moderate to severe calcific coronary artery disease. LUNGS AND PLEURA: There are ground-glass and reticular opacities present dependently within the lungs bilaterally. The nondependent lungs are clear. No evidence of pleural effusion or pneumothorax. THORACIC BONES AND SOFT TISSUES: No acute bone or soft tissue abnormality. ABDOMEN AND PELVIS: LIVER: There is heterogeneous enhancement of the liver and cirrhotic morphology. Hepatic lesions cannot be excluded given the heterogeneous attenuation. There is recanalization of the umbilical vein. GALLBLADDER AND BILE DUCTS: The gallbladder is moderately distended, measuring greater than 13 cm in length and greater than 5 cm in transverse dimension. There is a calcified stone layering dependently within the gallbladder. There is no definite evidence of cholecystitis. SPLEEN: There is moderate splenomegaly. The spleen measures approximately 16 cm in length. PANCREAS: The pancreas is unremarkable. ADRENAL GLANDS: Bilateral adrenal glands demonstrate no acute abnormality. KIDNEYS, URETERS AND BLADDER: There are simple renal cysts present bilaterally, including an exophytic cyst on the right measuring nearly 8 cm in diameter. No follow up of the cysts is necessary. No stones in the kidneys or ureters. No hydronephrosis. No perinephric or periureteral stranding. Urinary bladder is unremarkable. GI AND BOWEL: There is a small sliding hiatus hernia. There are few sigmoid diverticula, but no evidence of diverticulitis. Stomach and duodenal sweep  demonstrate no acute abnormality. There is no bowel obstruction. No abnormal bowel wall thickening or distension. REPRODUCTIVE: Reproductive organs are unremarkable. PERITONEUM AND RETROPERITONEUM: No ascites or free air. LYMPH NODES: No lymphadenopathy. ABDOMINAL BONES AND SOFT TISSUES: No acute abnormality of the bones. No acute soft tissue abnormality. IMPRESSION: 1. No evidence of acute aortic syndrome. 2. 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 3. Moderate calcific plaque throughout the thoracic aorta. 4. Mild stenosis of the origins of the renal arteries bilaterally, worse on the right (approximately 50% stenotic). 5. Heterogeneous enhancement of the liver with cirrhotic morphology. Hepatic lesions cannot be excluded. Follow up MRI of the liver without and with gadolinium contrast is suggested. 6. Moderate splenomegaly, measuring approximately 16 cm in length. 7. Small sliding hiatus hernia. Electronically signed by: evalene coho 06/08/2024 09:19 AM EDT RP Workstation: [id number]    ECHO ordered  TELEMETRY reviewed by me 06/08/2024: Sinus rhythm, rate 70s  EKG reviewed by me: sinus bradycardia, rate 43 bpm without acute ischemic changes.   Data reviewed by me 06/08/2024: last 24h vitals tele labs imaging I/O ED provider note, admission H&P.  Principal Problem:   Abdominal pain    ASSESSMENT AND PLAN:  Nicholas Abbott is a 73 y.o. male  with a past medical history of coronary artery disease s/p stent to LAD-D2 (04/2022), hypertension, hyperlipidemia, hx bradycardia, obesity, OSA, T2DM, liver cirrhosis, chronic thrombocytopenia who presented to the ED on 06/08/2024 for acute onset abdominal pain and upper back pain. Cardiology was consulted for further evaluation.   # Abdominal/back pain # Chronic liver cirrhosis  Patient has history of  chronic liver cirrhosis.  CTA with no evidence of acute aortic syndrome.  Pain appears MSK related as it worsens with certain positions and movements. - Right upper quadrant ultrasound ordered. - Management per primary team.  # Coronary artery disease s/p stent to LAD-D2 (2023) # Hypertension # Hyperlipidemia Patient is without chest pain.  EKG without acute ischemic changes.  Troponins negative x 2. - Echo ordered.  Further recommendations pending results. - Continue  home aspirin  81 mg. - Recommend resuming home statin if LFTs at baseline with chronic liver cirrhosis. - Continue home lisinopril  20 mg-HCTZ 12.5 mg daily. - Continue home metoprolol  succinate 12.5 mg daily.  Hold off increasing due to baseline bradycardia. - No plan for invasive cardiac diagnostics at this time.   This patient's plan of care was discussed and created with Dr. Florencio and he is in agreement.  Signed: Dorene Comfort, PA-C  06/08/2024, 12:49 PM [facility]iology

## 2024-06-08 NOTE — H&P (Signed)
 History and Physical    Nicholas Abbott FMW:[id number] DOB: 12-May-1951 DOA: 06/08/2024  PCP: Sherial Bail, MD (Confirm with patient/family/NH records and if not entered, this has to be entered at [facility] point of entry) Patient coming from: Home  I have personally briefly reviewed patient's old medical records in [facility]  Chief Complaint: Belly hurts, back pain  HPI: Nicholas Abbott is a 73 y.o. male with medical history significant of multivessel CAD status post stenting, HTN, HLD, OSA on CPAP, liver cirrhosis, chronic thrombocytopenia, obesity, presented with new onset of abdominal pain and back pain.    Patient woke up this morning with 7-8/10 dull like abdominal pain and new onset of back pain, both pain has been constant, nonradiating, abdominal pain associated with feeling of nausea but no vomiting.  Patient had a normal bowel movement yesterday and she ate same home-cooked food with his wife yesterday evening and the wife has been okay.  Patient has a history of significant CAD MI many years ago and he described that when he had the MI, his main symptoms was back pain.  But he said also that today's back pain is different in nature compared to MRI associated back pain years ago.  Patient does have a history of cirrhosis with chronic transaminitis and bilirubinemia for which he has been following with PCP, he denied cirrhosis is related to alcohol drinking claiming that he drinks only occasionally and even after stopped drinking alcohol for 2 months his liver enzymes remains elevated.  He was recommended to see GI for further workup recently. ED Course: Afebrile, bradycardia heart rate in 50s-60s blood pressure 130/60 with saturation 97% on room air.  Blood work showed WBC 12.4 hemoglobin 15.4 platelet 91 albumin 3.0 AST 111 ALT 95 bilirubin 2.0 BUN 20 creatinine 0.9.  CTA chest abdomen showed no signs of dissection, no acute findings to explain patient's symptoms and chronic findings  including cirrhosis, small aneurysm.  Review of Systems: As per HPI otherwise 14 point review of systems negative.    Past Medical History:  Diagnosis Date   Basal cell carcinoma    L ear, txted in past   Diabetes mellitus without complication (HCC)    GERD (gastroesophageal reflux disease)    Hyperlipidemia    Hypertension    Left shoulder pain    Myocardial infarction [facility])     Past Surgical History:  Procedure Laterality Date   CATARACT EXTRACTION     COLONOSCOPY WITH PROPOFOL  N/A 01/01/2020   Procedure: COLONOSCOPY WITH PROPOFOL ;  Surgeon: [name], [name], MD;  Location: [facility] ENDOSCOPY;  Service: Gastroenterology;  Laterality: N/A;   CORONARY ANGIOPLASTY     ESOPHAGOGASTRODUODENOSCOPY (EGD) WITH PROPOFOL  N/A 01/01/2020   Procedure: ESOPHAGOGASTRODUODENOSCOPY (EGD) WITH PROPOFOL ;  Surgeon: [name], [name], MD;  Location: [facility] ENDOSCOPY;  Service: Gastroenterology;  Laterality: N/A;   EYE SURGERY     LEFT HEART CATH AND CORONARY ANGIOGRAPHY N/A 04/29/2022   Procedure: LEFT HEART CATH AND CORONARY ANGIOGRAPHY;  Surgeon: [name], MD;  Location: [facility] INVASIVE CV LAB;  Service: Cardiovascular;  Laterality: N/A;   RETINAL DETACHMENT SURGERY       reports that he has never smoked. He has never used smokeless tobacco. He reports current alcohol use of about 5.0 standard drinks of alcohol per week. He reports that he does not use drugs.  Allergies  Allergen Reactions   Pioglitazone Rash    Family History  Problem Relation Age of Onset   Leukemia Father  Prior to Admission medications   Medication Sig Start Date End Date Taking? Authorizing Provider  acetaminophen  (TYLENOL ) 500 MG tablet Take 1,000 mg by mouth every 8 (eight) hours as needed.    [provider]  aspirin  EC 81 MG tablet Take 81 mg by mouth daily.    [provider]  Blood Glucose Monitoring Suppl DEVI 1 each by Does not apply route in the morning, at noon, and at bedtime. May  substitute to any manufacturer covered by patient's insurance. 01/22/23   [name], MD  Cinnamon 500 MG capsule Take 1,000 mg by mouth daily.    [provider]  clopidogrel  (PLAVIX ) 75 MG tablet Take 75 mg by mouth daily.    [provider]  Cyanocobalamin  (VITAMIN B-12) 5000 MCG TBDP Take 5,000 mcg by mouth daily.     [provider]  empagliflozin  (JARDIANCE ) 10 MG TABS tablet Take 1 tablet by mouth daily with breakfast. 02/16/22   [provider]  folic acid -pyridoxine -cyancobalamin (FOLTX) 2.5-25-2 MG TABS tablet Take 1 tablet by mouth daily. 02/08/17   [provider]  hydrocortisone  2.5 % cream APPLY TO THE GROIN DAILY ON MONDAY, WEDNESDAY, AND FRIDAY. 01/27/23   [name], MD  ketoconazole  (NIZORAL ) 2 % cream APPLY TO THE GROIN AT BEDTIME 12/20/23   [name], MD  levothyroxine  (SYNTHROID ) 112 MCG tablet Take 112 mcg by mouth daily. 02/20/22   [provider]  lisinopril -hydrochlorothiazide  (ZESTORETIC ) 20-12.5 MG tablet Take 1 tablet by mouth daily. 04/06/23 04/05/24  [provider]  metoprolol  succinate (TOPROL -XL) 25 MG 24 hr tablet Take 0.5 tablets (12.5 mg total) by mouth daily. 04/29/22 04/29/23  [name], MD  nitroGLYCERIN  (NITROSTAT ) 0.4 MG SL tablet Place 0.4 mg under the tongue every 5 (five) minutes as needed for chest pain. 05/01/22   [provider]  PHOSPHATIDYLSERINE PO Take by mouth.    [provider]  rosuvastatin  (CRESTOR ) 40 MG tablet Take by mouth. Patient not taking: Reported on 04/16/2023 04/23/22   [provider]  tadalafil (CIALIS) 5 MG tablet Take 5 mg by mouth daily as needed for erectile dysfunction. 08/31/22   [provider]  traMADol  (ULTRAM ) 50 MG tablet Take 50 mg by mouth daily as needed for severe pain or moderate pain. 02/05/21   [provider]    Physical Exam: Vitals:   06/08/24 0730 06/08/24 0830 06/08/24 1200 06/08/24  1216  BP: (!) 112/48 134/67 (!) 143/78   Pulse: (!) 44 (!) 53 60   Resp: 18 15 13    Temp:    98.1 F (36.7 C)  TempSrc:    Oral  SpO2: 90% 94% 97%   Weight:      Height:        Constitutional: NAD, calm, comfortable Vitals:   06/08/24 0730 06/08/24 0830 06/08/24 1200 06/08/24 1216  BP: (!) 112/48 134/67 (!) 143/78   Pulse: (!) 44 (!) 53 60   Resp: 18 15 13    Temp:    98.1 F (36.7 C)  TempSrc:    Oral  SpO2: 90% 94% 97%   Weight:      Height:       Eyes: PERRL, lids and conjunctivae normal ENMT: Mucous membranes are moist. Posterior pharynx clear of any exudate or lesions.Normal dentition.  Neck: normal, supple, no masses, no thyromegaly Respiratory: clear to auscultation bilaterally, no wheezing, no crackles. Normal respiratory effort. No accessory muscle use.  Cardiovascular: Regular rate and rhythm, no murmurs /  rubs / gallops. No extremity edema. 2+ pedal pulses. No carotid bruits.  Abdomen: Mild tenderness on deep palpation on periumbilical area with no rebound no guarding, no masses palpated. No hepatosplenomegaly. Bowel sounds positive.  Musculoskeletal: Tenderness on upper back and passive movement of arms Skin: no rashes, lesions, ulcers. No induration Neurologic: CN 2-12 grossly intact. Sensation intact, DTR normal. Strength 5/5 in all 4.  Psychiatric: Normal judgment and insight. Alert and oriented x 3. Normal mood.    Labs on Admission: I have personally reviewed following labs and imaging studies  CBC: Recent Labs  Lab 06/08/24 0711  WBC 12.4*  NEUTROABS 4.0  HGB 15.4  HCT 44.9  MCV 101.1*  PLT 91*   Basic Metabolic Panel: Recent Labs  Lab 06/08/24 0711 06/08/24 0810  NA 138  --   K 4.8  --   CL 104  --   CO2 21*  --   GLUCOSE 152*  --   BUN 20  --   CREATININE 0.98 0.90  CALCIUM  9.5  --   MG 1.9  --    GFR: Estimated Creatinine Clearance: 95.3 mL/min (by C-G formula based on SCr of 0.9 mg/dL). Liver Function Tests: Recent Labs  Lab  06/08/24 0711  AST 119*  ALT 95*  ALKPHOS 92  BILITOT 2.0*  PROT 6.3*  ALBUMIN 3.0*   Recent Labs  Lab 06/08/24 0711  LIPASE 48   No results for input(s): AMMONIA in the last 168 hours. Coagulation Profile: No results for input(s): INR, PROTIME in the last 168 hours. Cardiac Enzymes: No results for input(s): CKTOTAL, CKMB, CKMBINDEX, TROPONINI in the last 168 hours. BNP (last 3 results) No results for input(s): PROBNP in the last 8760 hours. HbA1C: No results for input(s): HGBA1C in the last 72 hours. CBG: No results for input(s): GLUCAP in the last 168 hours. Lipid Profile: No results for input(s): CHOL, HDL, LDLCALC, TRIG, CHOLHDL, LDLDIRECT in the last 72 hours. Thyroid Function Tests: Recent Labs    06/08/24 0711  TSH 5.763*   Anemia Panel: No results for input(s): VITAMINB12, FOLATE, FERRITIN, TIBC, IRON, RETICCTPCT in the last 72 hours. Urine analysis:    Component Value Date/Time   COLORURINE YELLOW (A) 01/22/2023 1317   APPEARANCEUR CLEAR (A) 01/22/2023 1317   LABSPEC 1.032 (H) 01/22/2023 1317   PHURINE 6.0 01/22/2023 1317   GLUCOSEU >=500 (A) 01/22/2023 1317   HGBUR MODERATE (A) 01/22/2023 1317   BILIRUBINUR NEGATIVE 01/22/2023 1317   KETONESUR NEGATIVE 01/22/2023 1317   PROTEINUR 100 (A) 01/22/2023 1317   NITRITE NEGATIVE 01/22/2023 1317   LEUKOCYTESUR NEGATIVE 01/22/2023 1317    Radiological Exams on Admission: CT Angio Chest/Abd/Pel for Dissection W and/or Wo Contrast Result Date: 06/08/2024 EXAM: CTA CHEST, ABDOMEN AND PELVIS WITH AND WITHOUT CONTRAST 06/08/2024 08:19:56 AM TECHNIQUE: CTA of the chest was performed with and without the administration of intravenous contrast. CTA of the abdomen and pelvis was performed with and without the administration of intravenous contrast. Multiplanar reformatted images are provided for review. MIP images are provided for review. Automated exposure control, iterative  reconstruction, and/or weight based adjustment of the mA/kV was utilized to reduce the radiation dose to as low as reasonably achievable. COMPARISON: None available. CLINICAL HISTORY: Acute aortic syndrome (AAS) suspected. Patient ambulatory to triage with steady gait, without difficulty or distress noted; pt reports awakening at 3am with generalized abd pain accomp by N/V; denies hx of same; Hx of heart stents. FINDINGS: VASCULATURE: AORTA: There is mild fusiform aneurysmal dilatation  of the ascending thoracic aorta, which measures up to 4.2 cm in AP diameter and 4.0 cm in transverse diameter. There is moderate calcific plaque present throughout the thoracic aorta. The descending thoracic aorta is tortuous. The proximal descending thoracic aorta measures approximately 3.5 cm in diameter and the distal descending thoracic aorta approximately 3.1 cm in diameter. There is no evidence of acute aortic syndrome. PULMONARY ARTERIES: No pulmonary embolism with the limits of this exam. GREAT VESSELS OF AORTIC ARCH: No acute finding. No dissection. No arterial occlusion or significant stenosis. CELIAC TRUNK: No acute finding. No occlusion or significant stenosis. SUPERIOR MESENTERIC ARTERY: No acute finding. No occlusion or significant stenosis. The mesenteric arteries are widely patent. INFERIOR MESENTERIC ARTERY: No acute finding. No occlusion or significant stenosis. RENAL ARTERIES: There is mild stenosis of the origins of the renal arteries bilaterally, worse on the right, which is approximately 50% stenotic. 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 CHEST: MEDIASTINUM: The heart is mildly enlarged and there is moderate to severe calcific coronary artery disease. LUNGS AND PLEURA: There are ground-glass and reticular opacities present dependently within the lungs bilaterally. The nondependent lungs are clear. No evidence of  pleural effusion or pneumothorax. THORACIC BONES AND SOFT TISSUES: No acute bone or soft tissue abnormality. ABDOMEN AND PELVIS: LIVER: There is heterogeneous enhancement of the liver and cirrhotic morphology. Hepatic lesions cannot be excluded given the heterogeneous attenuation. There is recanalization of the umbilical vein. GALLBLADDER AND BILE DUCTS: The gallbladder is moderately distended, measuring greater than 13 cm in length and greater than 5 cm in transverse dimension. There is a calcified stone layering dependently within the gallbladder. There is no definite evidence of cholecystitis. SPLEEN: There is moderate splenomegaly. The spleen measures approximately 16 cm in length. PANCREAS: The pancreas is unremarkable. ADRENAL GLANDS: Bilateral adrenal glands demonstrate no acute abnormality. KIDNEYS, URETERS AND BLADDER: There are simple renal cysts present bilaterally, including an exophytic cyst on the right measuring nearly 8 cm in diameter. No follow up of the cysts is necessary. No stones in the kidneys or ureters. No hydronephrosis. No perinephric or periureteral stranding. Urinary bladder is unremarkable. GI AND BOWEL: There is a small sliding hiatus hernia. There are few sigmoid diverticula, but no evidence of diverticulitis. Stomach and duodenal sweep demonstrate no acute abnormality. There is no bowel obstruction. No abnormal bowel wall thickening or distension. REPRODUCTIVE: Reproductive organs are unremarkable. PERITONEUM AND RETROPERITONEUM: No ascites or free air. LYMPH NODES: No lymphadenopathy. ABDOMINAL BONES AND SOFT TISSUES: No acute abnormality of the bones. No acute soft tissue abnormality. IMPRESSION: 1. No evidence of acute aortic syndrome. 2. 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 3. Moderate calcific plaque throughout the  thoracic aorta. 4. Mild stenosis of the origins of the renal arteries bilaterally, worse on the right (approximately 50% stenotic). 5. Heterogeneous enhancement of the liver with cirrhotic morphology. Hepatic lesions cannot be excluded. Follow up MRI of the liver without and with gadolinium contrast is suggested. 6. Moderate splenomegaly, measuring approximately 16 cm in length. 7. Small sliding hiatus hernia. Electronically signed by: evalene coho 06/08/2024 09:19 AM EDT RP Workstation: [id number]    EKG: Independently reviewed.  Sinus rhythm, frequent PVCs  Assessment/Plan Principal Problem:   Abdominal pain  (please populate well all problems here in Problem List. (  For example, if patient is on BP meds at home and you resume or decide to hold them, it is a problem that needs to be her. Same for CAD, COPD, HLD and so on)  Acute abdominal pain - No structural abnormality found to explain his symptoms, differential is wide, patient does have a chronic cirrhosis, but image study does not show any significant ascites to implying acute pathology such as SBP, and clinically and image study was low suspicion for bowel obstruction. - Ordered a RUQ ultrasound with Doppler   Back pain - Appears to be musculoskeletal pain, ordered trial of lidocaine  patch - Given patient's history of multivessel CAD with significant history of MI with similar presentation with back pain, ordered echocardiogram.  Also discussed case with cardiology who will see the patient this afternoon.  Troponin negative x 2 EKG showed no acute ST changes, ACS unlikely.  Liver cirrhosis Chronic transaminitis Chronic bilirubinemia Chronic thrombocytopenia - Unknown etiology, alcoholic?,  NASH? Autoimune? - Appears to be stable, no significant impairment of synthetic function of liver isolation normal albumin level and INR pending - I offered patient obstinate form alcohol drinking patient however declined.  CAD - Continue  aspirin  Plavix   HTN - Continue hydrochlorothiazide /lisinopril   Sinus bradycardia Frequent PVCs - Compared to his recent office record, baseline heart rate in 40-60s and blood pressure stable.  As per recommendation from his [facility]ology at [facility], will continue low-dose of metoprolol   Alcohol abuse - Appears to have binge drinking habit but denied ever abusing large amount. - Currently there is no symptoms signs of alcohol withdrawal  Total time spent on patient care 75 minutes  DVT prophylaxis: SCD Code Status: Full code Family Communication: Wife at bedside Disposition Plan: Expect less than 2 midnight hospital stay Consults called: Cardiology Admission status: Tele obs   Cort ONEIDA Mana MD [facility] Pager [phone]  06/08/2024, 12:36 PM

## 2024-06-08 NOTE — Plan of Care (Signed)

## 2024-06-08 NOTE — ED Provider Notes (Signed)
 Emergency Medicine Provider Triage Evaluation Note  Nicholas Abbott , a 73 y.o. male  was evaluated in triage.  Pt complains of nominal pain, pain that radiates into his shoulders.  Review of Systems  Positive: Abdominal pain Negative: Vomiting, diarrhea  Physical Exam  Ht 6' 2 (1.88 m)   Wt 110.7 kg   BMI 31.33 kg/m  Gen:   Awake, no distress   Resp:  Normal effort  MSK:   Moves extremities without difficulty  Other:  Abdomen diffusely tender without guarding or rebound  Medical Decision Making  Medically screening exam initiated at 6:45 AM.  Appropriate orders placed.  Nicholas Abbott was informed that the remainder of the evaluation will be completed by another provider, this initial triage assessment does not replace that evaluation, and the importance of remaining in the ED until their evaluation is complete.  Patient here with history of hypertension, hyperlipidemia, diabetes, CAD who presents to the emergency department complaints of generalized abdominal pain and pain that radiates into his back and shoulder blades.  No fevers, nausea, vomiting, diarrhea.  No prior abdominal surgery.  On exam, patient has mild tenderness to palpation diffusely without guarding or rebound.  Will obtain labs, EKG, urine, CT abdomen pelvis.  Will give IV pain and nausea medicine and keep NPO.   Tiaria Biby, Josette SAILOR, DO 06/08/24 [phone]

## 2024-06-08 NOTE — ED Triage Notes (Signed)
 Patient ambulatory to triage with steady gait, without difficulty or distress noted; pt reports awakening at 3am with generalized abd pain accomp by N/V; denies hx of same

## 2024-06-08 NOTE — ED Provider Notes (Signed)
 [facility] Provider Note    Event Date/Time   First MD Initiated Contact with Patient 06/08/24 0701     (approximate)   History   Chief Complaint: Abdominal Pain   HPI  Nicholas Abbott is a 73 y.o. male With a history of GERD hypertension diabetes who comes ED complaining of generalized abdominal pain waking him up at 3:00 AM this morning, associated with nausea no vomiting, no diarrhea or constipation or fever.  Also associated with severe 9/10 pain between the shoulder blades.  No dizziness.  Denies any recent exertional symptoms such as chest pain DOE or orthostatic dizziness.  He does report that his resting heart rate is chronically in the 40s.  At bedtime last night he felt normal.  He took a total of 16 mg of oral Zofran  at home for the nausea with minimal relief.        Past Medical History:  Diagnosis Date   Basal cell carcinoma    L ear, txted in past   Diabetes mellitus without complication (HCC)    GERD (gastroesophageal reflux disease)    Hyperlipidemia    Hypertension    Left shoulder pain    Myocardial infarction [facility])     Current Outpatient Rx   Order #: [id number] Class: Historical Med   Order #: [id number] Class: Historical Med   Order #: [id number] Class: Normal   Order #: [id number] Class: Historical Med   Order #: [id number] Class: Historical Med   Order #: [id number] Class: Historical Med   Order #: [id number] Class: Historical Med   Order #: [id number] Class: Historical Med   Order #: [id number] Class: Normal   Order #: [id number] Class: Normal   Order #: [id number] Class: Historical Med   Order #: [id number] Class: Historical Med   Order #: [id number] Class: Normal   Order #: [id number] Class: Historical Med   Order #: [id number] Class: Historical Med   Order #: [id number] Class: Historical Med   Order #: [id number] Class: Historical Med   Order #: [id number] Class: Historical Med    Past Surgical History:  Procedure Laterality Date   CATARACT  EXTRACTION     COLONOSCOPY WITH PROPOFOL  N/A 01/01/2020   Procedure: COLONOSCOPY WITH PROPOFOL ;  Surgeon: [name], [name], MD;  Location: [facility] ENDOSCOPY;  Service: Gastroenterology;  Laterality: N/A;   CORONARY ANGIOPLASTY     ESOPHAGOGASTRODUODENOSCOPY (EGD) WITH PROPOFOL  N/A 01/01/2020   Procedure: ESOPHAGOGASTRODUODENOSCOPY (EGD) WITH PROPOFOL ;  Surgeon: [name], [name], MD;  Location: [facility] ENDOSCOPY;  Service: Gastroenterology;  Laterality: N/A;   EYE SURGERY     LEFT HEART CATH AND CORONARY ANGIOGRAPHY N/A 04/29/2022   Procedure: LEFT HEART CATH AND CORONARY ANGIOGRAPHY;  Surgeon: [name], MD;  Location: [facility] INVASIVE CV LAB;  Service: Cardiovascular;  Laterality: N/A;   RETINAL DETACHMENT SURGERY      Physical Exam   Triage Vital Signs: ED Triage Vitals  Encounter Vitals Group     BP 06/08/24 0647 125/81     Girls Systolic BP Percentile --      Girls Diastolic BP Percentile --      Boys Systolic BP Percentile --      Boys Diastolic BP Percentile --      Pulse Rate 06/08/24 0647 (!) 48     Resp 06/08/24 0647 18     Temp 06/08/24 0647 98.2 F (36.8 C)     Temp Source 06/08/24 0647 Oral     SpO2 06/08/24 0647 96 %     Weight 06/08/24 0636 244 lb (110.7 kg)  Height 06/08/24 0636 6' 2 (1.88 m)     Head Circumference --      Peak Flow --      Pain Score 06/08/24 0636 6     Pain Loc --      Pain Education --      Exclude from Growth Chart --     Most recent vital signs: Vitals:   06/08/24 1200 06/08/24 1216  BP: (!) 143/78   Pulse: 60   Resp: 13   Temp:  98.1 F (36.7 C)  SpO2: 97%     General: Awake, no distress.  CV:  Good peripheral perfusion.  Bradycardia, heart rate 45.  Symmetric distal pulses.  No murmur Resp:  Normal effort.  Clear to auscultation Abd:  No distention.  Soft, mild generalized tenderness.  No peritoneal signs.  Not tympanitic to percussion Other:  No lower extremity edema   ED Results / Procedures / Treatments   Labs (all  labs ordered are listed, but only abnormal results are displayed) Labs Reviewed  CBC WITH DIFFERENTIAL/PLATELET - Abnormal; Notable for the following components:      Result Value   WBC 12.4 (*)    MCV 101.1 (*)    MCH 34.7 (*)    RDW 15.9 (*)    Platelets 91 (*)    Lymphs Abs 7.3 (*)    All other components within normal limits  COMPREHENSIVE METABOLIC PANEL WITH GFR - Abnormal; Notable for the following components:   CO2 21 (*)    Glucose, Bld 152 (*)    Total Protein 6.3 (*)    Albumin 3.0 (*)    AST 119 (*)    ALT 95 (*)    Total Bilirubin 2.0 (*)    All other components within normal limits  URINALYSIS, ROUTINE W REFLEX MICROSCOPIC - Abnormal; Notable for the following components:   Color, Urine YELLOW (*)    APPearance CLEAR (*)    Specific Gravity, Urine >1.046 (*)    Glucose, UA >=500 (*)    Hgb urine dipstick MODERATE (*)    Ketones, ur 5 (*)    Bacteria, UA RARE (*)    All other components within normal limits  TSH - Abnormal; Notable for the following components:   TSH 5.763 (*)    All other components within normal limits  LIPASE, BLOOD  MAGNESIUM  PATHOLOGIST SMEAR REVIEW  URINE DRUG SCREEN, QUALITATIVE ([facility] ONLY)  I-STAT CREATININE, ED  TROPONIN I (HIGH SENSITIVITY)  TROPONIN I (HIGH SENSITIVITY)     EKG Interpreted by me Sinus bradycardia with frequent PVCs, ventricular rate 71.  Normal axis, normal intervals.  Normal ST segments and T waves.  Poor R wave progression  Repeat EKG shows sinus bradycardia without ectopy, rate of 43.  Normal axis intervals ST segments T waves.  Poor R wave progression in anterior leads   RADIOLOGY CTA chest abdomen pelvis interpreted by me, negative for dissection.  Radiology report reviewed, unremarkable   PROCEDURES:  Procedures   MEDICATIONS ORDERED IN ED: Medications  nitroGLYCERIN  (NITROSTAT ) SL tablet 0.4 mg (has no administration in time range)  aspirin  EC tablet 81 mg (has no administration in time  range)  traMADol  (ULTRAM ) tablet 50 mg (has no administration in time range)  lisinopril -hydrochlorothiazide  (ZESTORETIC ) 20-12.5 MG per tablet 1 tablet (has no administration in time range)  metoprolol  succinate (TOPROL -XL) 24 hr tablet 12.5 mg (has no administration in time range)  levothyroxine  (SYNTHROID ) tablet 112 mcg (has no administration in time  range)  clopidogrel  (PLAVIX ) tablet 75 mg (has no administration in time range)  ondansetron  (ZOFRAN ) tablet 4 mg (has no administration in time range)    Or  ondansetron  (ZOFRAN ) injection 4 mg (has no administration in time range)  HYDROmorphone  (DILAUDID ) injection 0.5-1 mg (has no administration in time range)  lidocaine  (LIDODERM ) 5 % 2 patch (has no administration in time range)  fentaNYL  (SUBLIMAZE ) injection 50 mcg (50 mcg Intravenous Given 06/08/24 0714)  ondansetron  (ZOFRAN ) injection 4 mg (4 mg Intravenous Given 06/08/24 0714)  HYDROmorphone  (DILAUDID ) injection 1 mg (1 mg Intravenous Given 06/08/24 0738)  lactated ringers  bolus 1,000 mL (0 mLs Intravenous Stopped 06/08/24 1216)  iohexol  (OMNIPAQUE ) 350 MG/ML injection 100 mL (100 mLs Intravenous Contrast Given 06/08/24 0800)  HYDROmorphone  (DILAUDID ) injection 1 mg (1 mg Intravenous Given 06/08/24 1205)     IMPRESSION / MDM / ASSESSMENT AND PLAN / ED COURSE  I reviewed the triage vital signs and the nursing notes.  DDx: Mesenteric ischemia, aortic dissection, non-STEMI, pancreatitis, biliary disease, gastroenteritis, diverticulitis  Patient's presentation is most consistent with acute presentation with potential threat to life or bodily function.  Patient presents with abdominal pain, back pain which is acute onset, severe.  Vital stable.  Patient was given Zofran  prior to my assessment, and with the dose he took at home, will continue to monitor on telemetry.  Will avoid additional medications with potential for QT prolongation, give IV fluids.  Clinical Course as of 06/08/24 1246  Thu  Jun 08, 2024  9077 Comprehensive metabolic panel(!) Lfts at baseline per labs June 2025 in care everywhere [PS]  1133 Case d/w hospitaliet [PS]    Clinical Course User Index [PS] [name], MD     FINAL CLINICAL IMPRESSION(S) / ED DIAGNOSES   Final diagnoses:  Chest pain with moderate risk for cardiac etiology     Rx / DC Orders   ED Discharge Orders     None        Note:  This document was prepared using Dragon voice recognition software and may include unintentional dictation errors.   [name], MD 06/08/24 1247

## 2024-06-09 ENCOUNTER — Observation Stay: Admit: 2024-06-09

## 2024-06-09 ENCOUNTER — Inpatient Hospital Stay

## 2024-06-09 ENCOUNTER — Observation Stay
Admit: 2024-06-09 | Discharge: 2024-06-09 | Disposition: A | Discharge status: Home / Self Care | Attending: Internal Medicine | Admitting: Internal Medicine

## 2024-06-09 DIAGNOSIS — C220 Liver cell carcinoma: Secondary | ICD-10-CM | POA: Diagnosis present

## 2024-06-09 DIAGNOSIS — N179 Acute kidney failure, unspecified: Secondary | ICD-10-CM | POA: Diagnosis present

## 2024-06-09 DIAGNOSIS — I7121 Aneurysm of the ascending aorta, without rupture: Secondary | ICD-10-CM | POA: Diagnosis present

## 2024-06-09 DIAGNOSIS — E875 Hyperkalemia: Secondary | ICD-10-CM | POA: Diagnosis present

## 2024-06-09 DIAGNOSIS — Z79899 Other long term (current) drug therapy: Secondary | ICD-10-CM | POA: Diagnosis not present

## 2024-06-09 DIAGNOSIS — K219 Gastro-esophageal reflux disease without esophagitis: Secondary | ICD-10-CM | POA: Diagnosis present

## 2024-06-09 DIAGNOSIS — K8000 Calculus of gallbladder with acute cholecystitis without obstruction: Secondary | ICD-10-CM | POA: Diagnosis present

## 2024-06-09 DIAGNOSIS — K838 Other specified diseases of biliary tract: Secondary | ICD-10-CM | POA: Diagnosis not present

## 2024-06-09 DIAGNOSIS — K839 Disease of biliary tract, unspecified: Secondary | ICD-10-CM | POA: Diagnosis not present

## 2024-06-09 DIAGNOSIS — I10 Essential (primary) hypertension: Secondary | ICD-10-CM | POA: Diagnosis present

## 2024-06-09 DIAGNOSIS — A419 Sepsis, unspecified organism: Secondary | ICD-10-CM | POA: Diagnosis present

## 2024-06-09 DIAGNOSIS — Z7902 Long term (current) use of antithrombotics/antiplatelets: Secondary | ICD-10-CM | POA: Diagnosis not present

## 2024-06-09 DIAGNOSIS — R160 Hepatomegaly, not elsewhere classified: Secondary | ICD-10-CM | POA: Diagnosis not present

## 2024-06-09 DIAGNOSIS — Z7982 Long term (current) use of aspirin: Secondary | ICD-10-CM | POA: Diagnosis not present

## 2024-06-09 DIAGNOSIS — Z6832 Body mass index (BMI) 32.0-32.9, adult: Secondary | ICD-10-CM | POA: Diagnosis not present

## 2024-06-09 DIAGNOSIS — K7460 Unspecified cirrhosis of liver: Secondary | ICD-10-CM

## 2024-06-09 DIAGNOSIS — R188 Other ascites: Secondary | ICD-10-CM

## 2024-06-09 DIAGNOSIS — E039 Hypothyroidism, unspecified: Secondary | ICD-10-CM | POA: Diagnosis present

## 2024-06-09 DIAGNOSIS — D696 Thrombocytopenia, unspecified: Secondary | ICD-10-CM | POA: Diagnosis present

## 2024-06-09 DIAGNOSIS — R1011 Right upper quadrant pain: Secondary | ICD-10-CM | POA: Diagnosis not present

## 2024-06-09 DIAGNOSIS — R6520 Severe sepsis without septic shock: Secondary | ICD-10-CM | POA: Diagnosis present

## 2024-06-09 DIAGNOSIS — Z85828 Personal history of other malignant neoplasm of skin: Secondary | ICD-10-CM | POA: Diagnosis not present

## 2024-06-09 DIAGNOSIS — K5710 Diverticulosis of small intestine without perforation or abscess without bleeding: Secondary | ICD-10-CM | POA: Diagnosis not present

## 2024-06-09 DIAGNOSIS — Z79890 Hormone replacement therapy (postmenopausal): Secondary | ICD-10-CM | POA: Diagnosis not present

## 2024-06-09 DIAGNOSIS — E785 Hyperlipidemia, unspecified: Secondary | ICD-10-CM | POA: Diagnosis present

## 2024-06-09 DIAGNOSIS — Z4659 Encounter for fitting and adjustment of other gastrointestinal appliance and device: Secondary | ICD-10-CM | POA: Diagnosis not present

## 2024-06-09 DIAGNOSIS — Z7984 Long term (current) use of oral hypoglycemic drugs: Secondary | ICD-10-CM | POA: Diagnosis not present

## 2024-06-09 DIAGNOSIS — I2510 Atherosclerotic heart disease of native coronary artery without angina pectoris: Secondary | ICD-10-CM | POA: Diagnosis present

## 2024-06-09 DIAGNOSIS — I81 Portal vein thrombosis: Secondary | ICD-10-CM | POA: Diagnosis present

## 2024-06-09 DIAGNOSIS — R1084 Generalized abdominal pain: Secondary | ICD-10-CM | POA: Diagnosis present

## 2024-06-09 DIAGNOSIS — E119 Type 2 diabetes mellitus without complications: Secondary | ICD-10-CM | POA: Diagnosis present

## 2024-06-09 LAB — ECHOCARDIOGRAM COMPLETE
AR max vel: 4.1 cm2
AV Area VTI: 5.01 cm2
AV Area mean vel: 4.1 cm2
AV Mean grad: 5 mmHg
AV Peak grad: 8.5 mmHg
Ao pk vel: 1.46 m/s
Area-P 1/2: 2.1 cm2
Height: 74 in
MV VTI: 4.56 cm2
S' Lateral: 2.8 cm
Weight: 3904 [oz_av]

## 2024-06-09 LAB — PROCALCITONIN: Procalcitonin: 4.72 ng/mL

## 2024-06-09 LAB — COMPREHENSIVE METABOLIC PANEL WITH GFR
ALT: 91 U/L — ABNORMAL HIGH (ref 0–44)
AST: 93 U/L — ABNORMAL HIGH (ref 15–41)
Albumin: 2.7 g/dL — ABNORMAL LOW (ref 3.5–5.0)
Alkaline Phosphatase: 86 U/L (ref 38–126)
Anion gap: 7 (ref 5–15)
BUN: 27 mg/dL — ABNORMAL HIGH (ref 8–23)
CO2: 23 mmol/L (ref 22–32)
Calcium: 9 mg/dL (ref 8.9–10.3)
Chloride: 105 mmol/L (ref 98–111)
Creatinine, Ser: 1.67 mg/dL — ABNORMAL HIGH (ref 0.61–1.24)
GFR, Estimated: 43 mL/min — ABNORMAL LOW (ref >60)
Glucose, Bld: 146 mg/dL — ABNORMAL HIGH (ref 70–99)
Potassium: 4.6 mmol/L (ref 3.5–5.1)
Sodium: 135 mmol/L (ref 135–145)
Total Bilirubin: 2.3 mg/dL — ABNORMAL HIGH (ref 0.0–1.2)
Total Protein: 5.7 g/dL — ABNORMAL LOW (ref 6.5–8.1)

## 2024-06-09 LAB — CBC
HCT: 43 % (ref 39.0–52.0)
Hemoglobin: 14.9 g/dL (ref 13.0–17.0)
MCH: 34.9 pg — ABNORMAL HIGH (ref 26.0–34.0)
MCHC: 34.7 g/dL (ref 30.0–36.0)
MCV: 100.7 fL — ABNORMAL HIGH (ref 80.0–100.0)
Platelets: 83 K/uL — ABNORMAL LOW (ref 150–400)
RBC: 4.27 MIL/uL (ref 4.22–5.81)
RDW: 15.9 % — ABNORMAL HIGH (ref 11.5–15.5)
WBC: 25.7 K/uL — ABNORMAL HIGH (ref 4.0–10.5)
nRBC: 0 % (ref 0.0–0.2)

## 2024-06-09 LAB — MRSA NEXT GEN BY PCR, NASAL: MRSA by PCR Next Gen: NOT DETECTED

## 2024-06-09 MED ORDER — GADOBUTROL 1 MMOL/ML IV SOLN
10.0000 mL | Freq: Once | INTRAVENOUS | Status: AC | PRN
  Administered 2024-06-09: 10 mL via INTRAVENOUS

## 2024-06-09 MED ORDER — TRAMADOL HCL 50 MG PO TABS
50.0000 mg | ORAL_TABLET | Freq: Four times a day (QID) | ORAL | Status: DC | PRN
  Administered 2024-06-09: 100 mg via ORAL
  Administered 2024-06-10: 50 mg via ORAL
  Administered 2024-06-10 – 2024-06-11 (×2): 100 mg via ORAL
  Administered 2024-06-12 (×3): 50 mg via ORAL
  Administered 2024-06-12 (×2): 100 mg via ORAL
  Administered 2024-06-12: 50 mg via ORAL
  Administered 2024-06-13 (×4): 100 mg via ORAL
  Administered 2024-06-14: 50 mg via ORAL
  Administered 2024-06-14: 100 mg via ORAL
  Administered 2024-06-14: 50 mg via ORAL
  Administered 2024-06-14 – 2024-06-16 (×4): 100 mg via ORAL
  Filled 2024-06-09 (×2): qty 2
  Filled 2024-06-09 (×2): qty 1
  Filled 2024-06-09 (×7): qty 2
  Filled 2024-06-09: qty 1
  Filled 2024-06-09: qty 2

## 2024-06-09 MED ORDER — SODIUM CHLORIDE 0.9 % IV SOLN
2.0000 g | INTRAVENOUS | Status: AC
  Administered 2024-06-09 – 2024-06-14 (×9): 2 g via INTRAVENOUS
  Filled 2024-06-09 (×8): qty 20

## 2024-06-09 MED ORDER — LIOTHYRONINE SODIUM 5 MCG PO TABS
5.0000 ug | ORAL_TABLET | Freq: Every morning | ORAL | Status: DC
  Administered 2024-06-09 – 2024-06-21 (×15): 5 ug via ORAL
  Filled 2024-06-09 (×14): qty 1

## 2024-06-09 MED ORDER — EMPAGLIFLOZIN 10 MG PO TABS
10.0000 mg | ORAL_TABLET | Freq: Every day | ORAL | Status: DC
  Administered 2024-06-11 – 2024-06-15 (×8): 10 mg via ORAL
  Filled 2024-06-09 (×6): qty 1

## 2024-06-09 MED ORDER — HYDROMORPHONE HCL 1 MG/ML IJ SOLN: 0.5000 mg | INTRAMUSCULAR | Status: DC | PRN

## 2024-06-09 MED ORDER — METRONIDAZOLE 500 MG/100ML IV SOLN
500.0000 mg | Freq: Two times a day (BID) | INTRAVENOUS | Status: DC
  Administered 2024-06-09 – 2024-06-17 (×21): 500 mg via INTRAVENOUS
  Filled 2024-06-09 (×18): qty 100

## 2024-06-09 MED ORDER — METHOCARBAMOL 1000 MG/10ML IJ SOLN
1000.0000 mg | Freq: Three times a day (TID) | INTRAMUSCULAR | Status: DC | PRN
  Administered 2024-06-09 – 2024-06-11 (×2): 1000 mg via INTRAVENOUS
  Filled 2024-06-09 (×3): qty 10

## 2024-06-09 MED ORDER — SODIUM CHLORIDE 0.9 % IV SOLN: INTRAVENOUS | Status: DC

## 2024-06-09 NOTE — Progress Notes (Addendum)
 [facility] CLINIC CARDIOLOGY PROGRESS NOTE       Patient ID: Nicholas Abbott MRN: [id number] DOB/AGE: [birth year] 74 y.o.  Admit date: 06/08/2024 Referring Physician Dr. Laurita Primary Physician Sherial Bail, MD Primary Cardiologist Dr. Dwain [facility] Cardiology) Reason for Consultation back pain, significant CAD hx  HPI: Nicholas Abbott is a 73 y.o. male  with a past medical history of coronary artery disease s/p stent to LAD-D2 (04/2022), hypertension, hyperlipidemia, hx bradycardia, obesity, OSA, T2DM, liver cirrhosis, chronic thrombocytopenia who presented to the ED on 06/08/2024 for acute onset abdominal pain and upper back pain. Cardiology was consulted for further evaluation.   Interval History: -Patient seen and examined this AM and laying comfortably in hospital bed. Patient states his abdominal and back pain has improved and denies any chest pain or SOB.  -Patients BP and HR stable this AM. Overnight Tele showed no significant events.  -Patient remains on room air with stable SpO2.  -WBC elevated at 25.7 this AM.    Pertinent Cardiac History (Most recent) LHC 04/2022 Single vessel coronary artery disease.  Successful PCI of LAD-D2   Initial plan was to do 2 stent technique for LAD-D2 bifurcation lesion, with atherectomy for moderate-severe calcification of mid LAD  Unfortunately, prior stent from 2001 covered/jailed ostium of D2, prohibiting atherectomy  EBU3.5 guide  RunThrough and BMW wires placed  UFH 9000 units  2.5 mm PTCA balloon x 3 (2 in the LAD, 1 in D2)  D2 looked patent and without flow limiting disease after angioplasty  IVUS used to optimize LAD PCI  2.75 x 28-mm Synergy DES in distal LAD  3.0 x 24-mm Synergy DES in mid LAD  Post-dilated with 3.0 mm, 3.5 mm, 4.0 mm and 4.5 mm Petrey balloons  D2 had normal flow at the end of the procedure  Excellent angiographic result   Review of systems complete and found to be negative unless listed above    Past  Medical History:  Diagnosis Date   Basal cell carcinoma    L ear, txted in past   Diabetes mellitus without complication (HCC)    GERD (gastroesophageal reflux disease)    Hyperlipidemia    Hypertension    Left shoulder pain    Myocardial infarction [facility])     Past Surgical History:  Procedure Laterality Date   CATARACT EXTRACTION     COLONOSCOPY WITH PROPOFOL  N/A 01/01/2020   Procedure: COLONOSCOPY WITH PROPOFOL ;  Surgeon: [name], [name], MD;  Location: [facility] ENDOSCOPY;  Service: Gastroenterology;  Laterality: N/A;   CORONARY ANGIOPLASTY     ESOPHAGOGASTRODUODENOSCOPY (EGD) WITH PROPOFOL  N/A 01/01/2020   Procedure: ESOPHAGOGASTRODUODENOSCOPY (EGD) WITH PROPOFOL ;  Surgeon: [name], [name], MD;  Location: [facility] ENDOSCOPY;  Service: Gastroenterology;  Laterality: N/A;   EYE SURGERY     LEFT HEART CATH AND CORONARY ANGIOGRAPHY N/A 04/29/2022   Procedure: LEFT HEART CATH AND CORONARY ANGIOGRAPHY;  Surgeon: [name], MD;  Location: [facility] INVASIVE CV LAB;  Service: Cardiovascular;  Laterality: N/A;   RETINAL DETACHMENT SURGERY      Medications Prior to Admission  Medication Sig Dispense Refill Last Dose/Taking   acetaminophen  (TYLENOL ) 500 MG tablet Take 1,000 mg by mouth every 8 (eight) hours as needed.   Unknown   aspirin  EC 81 MG tablet Take 81 mg by mouth daily.   06/07/2024   Cinnamon 500 MG capsule Take 1,000 mg by mouth daily.   06/07/2024   Cyanocobalamin  (VITAMIN B-12) 5000 MCG TBDP Take 5,000 mcg by mouth daily.  06/07/2024   empagliflozin  (JARDIANCE ) 10 MG TABS tablet Take 1 tablet by mouth daily with breakfast.   06/07/2024   ezetimibe (ZETIA) 10 MG tablet Take 10 mg by mouth daily.   06/07/2024   folic acid -pyridoxine -cyancobalamin (FOLTX) 2.5-25-2 MG TABS tablet Take 1 tablet by mouth daily.   06/07/2024   hydrocortisone  2.5 % cream APPLY TO THE GROIN DAILY ON MONDAY, WEDNESDAY, AND FRIDAY. 28 g 1 Unknown   ketoconazole  (NIZORAL ) 2 % cream APPLY TO THE GROIN AT BEDTIME 60 g 11  Unknown   levothyroxine  (SYNTHROID ) 112 MCG tablet Take 112 mcg by mouth daily.   06/07/2024   liothyronine  (CYTOMEL ) 5 MCG tablet Take 5 mcg by mouth every morning.   06/07/2024   lisinopril -hydrochlorothiazide  (ZESTORETIC ) 20-12.5 MG tablet Take 1 tablet by mouth daily.   06/07/2024   metoprolol  succinate (TOPROL -XL) 25 MG 24 hr tablet Take 0.5 tablets (12.5 mg total) by mouth daily. 15 tablet 11 06/07/2024   nitroGLYCERIN  (NITROSTAT ) 0.4 MG SL tablet Place 0.4 mg under the tongue every 5 (five) minutes as needed for chest pain.   Unknown   rosuvastatin  (CRESTOR ) 40 MG tablet Take by mouth.   06/07/2024   tadalafil (CIALIS) 5 MG tablet Take 5 mg by mouth daily as needed for erectile dysfunction.   Unknown   traMADol  (ULTRAM ) 50 MG tablet Take 50 mg by mouth daily as needed for severe pain or moderate pain.   Unknown   Blood Glucose Monitoring Suppl DEVI 1 each by Does not apply route in the morning, at noon, and at bedtime. May substitute to any manufacturer covered by patient's insurance. 1 each 0    clopidogrel  (PLAVIX ) 75 MG tablet Take 75 mg by mouth daily. (Patient not taking: Reported on 06/08/2024)   Not Taking   PHOSPHATIDYLSERINE PO Take by mouth.      Social History   Socioeconomic History   Marital status: Married    Spouse name: Not on file   Number of children: Not on file   Years of education: Not on file   Highest education level: Not on file  Occupational History   Not on file  Tobacco Use   Smoking status: Never   Smokeless tobacco: Never  Vaping Use   Vaping status: Never Used  Substance and Sexual Activity   Alcohol use: Yes    Alcohol/week: 5.0 standard drinks of alcohol    Types: 5 Shots of liquor per week   Drug use: Never   Sexual activity: Yes    Birth control/protection: Post-menopausal  Other Topics Concern   Not on file  Social History Narrative   Not on file   Social Drivers of Health   Financial Resource Strain: Low Risk  (03/07/2024)   Received from [facility]   Overall Financial Resource Strain (CARDIA)    Difficulty of Paying Living Expenses: Not hard at all  Food Insecurity: No Food Insecurity (06/08/2024)   Hunger Vital Sign    Worried About Running Out of Food in the Last Year: Never true    Abbott Out of Food in the Last Year: Never true  Transportation Needs: No Transportation Needs (06/08/2024)   PRAPARE - Administrator, Civil Service (Medical): No    Lack of Transportation (Non-Medical): No  Physical Activity: Not on file  Stress: Not on file  Social Connections: Socially Integrated (06/08/2024)   Social Connection and Isolation Panel    Frequency of Communication with Friends and Family: More  than three times a week    Frequency of Social Gatherings with Friends and Family: More than three times a week    Attends Religious Services: 1 to 4 times per year    Active Member of Clubs or Organizations: Yes    Attends Banker Meetings: More than 4 times per year    Marital Status: Married  Catering manager Violence: Not At Risk (06/08/2024)   Humiliation, Afraid, Rape, and Kick questionnaire    Fear of Current or Ex-Partner: No    Emotionally Abused: No    Physically Abused: No    Sexually Abused: No    Family History  Problem Relation Age of Onset   Leukemia Father      Vitals:   06/09/24 0327 06/09/24 0329 06/09/24 0937 06/09/24 1310  BP: (!) 93/56 (!) 102/56 102/65   Pulse: 72 84 77   Resp: 16  18   Temp: 98.8 F (37.1 C)  98.3 F (36.8 C) 99.8 F (37.7 C)  TempSrc: Oral  Oral Oral  SpO2: 95% 94% 95%   Weight:      Height:        PHYSICAL EXAM General: Well-appearing elderly male, well nourished, in no acute distress. HEENT: Normocephalic and atraumatic. Neck: No JVD.   Lungs: Normal respiratory effort on room air. Clear bilaterally to auscultation. No wheezes, crackles, rhonchi.  Heart: HRRR. Normal S1 and S2 without gallops or murmurs.  Abdomen: Non-distended appearing.   Msk: Normal strength and tone for age. Extremities: Warm and well perfused. No clubbing, cyanosis, edema.  Neuro: Alert and oriented X 3. Psych: Answers questions appropriately.   Labs: Basic Metabolic Panel: Recent Labs    06/08/24 0711 06/08/24 0810 06/09/24 0323  NA 138  --  135  K 4.8  --  4.6  CL 104  --  105  CO2 21*  --  23  GLUCOSE 152*  --  146*  BUN 20  --  27*  CREATININE 0.98 0.90 1.67*  CALCIUM  9.5  --  9.0  MG 1.9  --   --    Liver Function Tests: Recent Labs    06/08/24 0711 06/09/24 0323  AST 119* 93*  ALT 95* 91*  ALKPHOS 92 86  BILITOT 2.0* 2.3*  PROT 6.3* 5.7*  ALBUMIN 3.0* 2.7*   Recent Labs    06/08/24 0711  LIPASE 48   CBC: Recent Labs    06/08/24 0711 06/09/24 0323  WBC 12.4* 25.7*  NEUTROABS 4.0  --   HGB 15.4 14.9  HCT 44.9 43.0  MCV 101.1* 100.7*  PLT 91* 83*   Cardiac Enzymes: Recent Labs    06/08/24 0711 06/08/24 1104  TROPONINIHS 11 11   BNP: No results for input(s): BNP in the last 72 hours. D-Dimer: No results for input(s): DDIMER in the last 72 hours. Hemoglobin A1C: No results for input(s): HGBA1C in the last 72 hours. Fasting Lipid Panel: No results for input(s): CHOL, HDL, LDLCALC, TRIG, CHOLHDL, LDLDIRECT in the last 72 hours. Thyroid Function Tests: Recent Labs    06/08/24 0711  TSH 5.763*   Anemia Panel: No results for input(s): VITAMINB12, FOLATE, FERRITIN, TIBC, IRON, RETICCTPCT in the last 72 hours.   Radiology: ECHOCARDIOGRAM COMPLETE Result Date: 06/09/2024    ECHOCARDIOGRAM REPORT   Patient Name:   Nicholas Abbott [facility] Date of Exam: 06/09/2024 Medical Rec #:  [id number]       Height:       74.0 in Accession #:  [id number]      Weight:       244.0 lb Date of Birth:  Aug 17, 1951       BSA:          2.365 m Patient Age:    73 years        BP:           102/56 mmHg Patient Gender: M               HR:           84 bpm. Exam Location:  [facility] Procedure: 2D Echo, Cardiac Doppler and  Color Doppler (Both Spectral and Color            Flow Doppler were utilized during procedure). Indications:     Chest pain R07.9  History:         Patient has no prior history of Echocardiogram examinations.                  Previous Myocardial Infarction; Risk Factors:Diabetes and                  Hypertension.  Sonographer:     Christopher Furnace Referring Phys:  [id number] CORT ONEIDA MANA Diagnosing Phys: Cara JONETTA Lovelace MD IMPRESSIONS  1. Left ventricular ejection fraction, by estimation, is 60 to 65%. The left ventricle has normal function. The left ventricle has no regional wall motion abnormalities. There is moderate asymmetric left ventricular hypertrophy of the septal segment. Left ventricular diastolic parameters are consistent with Grade I diastolic dysfunction (impaired relaxation).  2. Right ventricular systolic function is normal. The right ventricular size is normal. Mildly increased right ventricular wall thickness.  3. The mitral valve is normal in structure. Trivial mitral valve regurgitation.  4. The aortic valve is normal in structure. Aortic valve regurgitation is trivial. FINDINGS  Left Ventricle: Left ventricular ejection fraction, by estimation, is 60 to 65%. The left ventricle has normal function. The left ventricle has no regional wall motion abnormalities. Strain was performed and the global longitudinal strain is indeterminate. The left ventricular internal cavity size was small. There is moderate asymmetric left ventricular hypertrophy of the septal segment. Left ventricular diastolic parameters are consistent with Grade I diastolic dysfunction (impaired relaxation). Right Ventricle: The right ventricular size is normal. Mildly increased right ventricular wall thickness. Right ventricular systolic function is normal. Left Atrium: Left atrial size was normal in size. Right Atrium: Right atrial size was normal in size. Pericardium: There is no evidence of pericardial effusion. Mitral Valve: The  mitral valve is normal in structure. Trivial mitral valve regurgitation. MV peak gradient, 4.8 mmHg. The mean mitral valve gradient is 2.0 mmHg. Tricuspid Valve: The tricuspid valve is normal in structure. Tricuspid valve regurgitation is mild. Aortic Valve: The aortic valve is normal in structure. Aortic valve regurgitation is trivial. Aortic valve mean gradient measures 5.0 mmHg. Aortic valve peak gradient measures 8.5 mmHg. Aortic valve area, by VTI measures 5.01 cm. Pulmonic Valve: The pulmonic valve was normal in structure. Pulmonic valve regurgitation is not visualized. Aorta: The ascending aorta was not well visualized. IAS/Shunts: No atrial level shunt detected by color flow Doppler. Additional Comments: 3D was performed not requiring image post processing on an independent workstation and was indeterminate.  LEFT VENTRICLE PLAX 2D LVIDd:         4.20 cm   Diastology LVIDs:         2.80 cm   LV e' medial:    6.42 cm/s  LV PW:         1.20 cm   LV E/e' medial:  8.8 LV IVS:        1.90 cm   LV e' lateral:   8.92 cm/s LVOT diam:     2.30 cm   LV E/e' lateral: 6.3 LV SV:         120 LV SV Index:   51 LVOT Area:     4.15 cm  RIGHT VENTRICLE RV Basal diam:  3.30 cm RV Mid diam:    2.60 cm LEFT ATRIUM             Index        RIGHT ATRIUM           Index LA diam:        4.10 cm 1.73 cm/m   RA Area:     10.70 cm LA Vol (A2C):   45.5 ml 19.24 ml/m  RA Volume:   19.10 ml  8.08 ml/m LA Vol (A4C):   27.7 ml 11.71 ml/m LA Biplane Vol: 38.4 ml 16.24 ml/m  AORTIC VALVE AV Area (Vmax):    4.10 cm AV Area (Vmean):   4.10 cm AV Area (VTI):     5.01 cm AV Vmax:           146.00 cm/s AV Vmean:          107.500 cm/s AV VTI:            0.240 m AV Peak Grad:      8.5 mmHg AV Mean Grad:      5.0 mmHg LVOT Vmax:         144.00 cm/s LVOT Vmean:        106.000 cm/s LVOT VTI:          0.290 m LVOT/AV VTI ratio: 1.21  AORTA Ao Root diam: 3.60 cm MITRAL VALVE               TRICUSPID VALVE MV Area (PHT): 2.10 cm    TR Peak  grad:   9.1 mmHg MV Area VTI:   4.56 cm    TR Vmax:        151.00 cm/s MV Peak grad:  4.8 mmHg MV Mean grad:  2.0 mmHg    SHUNTS MV Vmax:       1.09 m/s    Systemic VTI:  0.29 m MV Vmean:      59.8 cm/s   Systemic Diam: 2.30 cm MV Decel Time: 361 msec MV E velocity: 56.60 cm/s MV A velocity: 87.00 cm/s MV E/A ratio:  0.65 Dwayne D Callwood MD Electronically signed by Cara JONETTA Lovelace MD Signature Date/Time: 06/09/2024/1:47:37 PM    Final    US  ABDOMEN LIMITED WITH LIVER DOPPLER Result Date: 06/08/2024 CLINICAL DATA:  Abdominal pain, jaundice EXAM: DUPLEX ULTRASOUND OF LIVER TECHNIQUE: Color and duplex Doppler ultrasound was performed to evaluate the hepatic in-flow and out-flow vessels. COMPARISON:  06/08/2024, 02/17/2022 FINDINGS: Liver: Liver displays diffusely heterogeneous liver echotexture, with multiple hyperechoic masses throughout the liver parenchyma. Index mass within the anterior right lobe liver measures 2.5 x 2.3 x 2.4 cm. Given ultrasound and recent CT findings, further evaluation with dedicated liver CT or MRI is recommended. Liver is enlarged measuring 17.6 cm in the midclavicular line. The gallbladder is moderately distended, with 2.2 cm shadowing gallstone within the gallbladder neck. Gallbladder wall measures up to 4 mm in maximal thickness. Main Portal Vein size: 1.7 cm Portal Vein Velocities  Main Prox:  11.9 cm/sec Main Mid: Occluded Main Dist: Occluded Right: Occluded Left: Occluded Hepatic Vein Velocities Right:  19.5 cm/sec Middle:  19.3 cm/sec Left:  24.7 cm/sec Normal directional flow, though waveforms are pulsatile and turbulent. IVC: Present and patent with normal respiratory phasicity. Hepatic Artery Velocity:  41.3 cm/sec Splenic Vein Velocity:  12.2 cm/sec Spleen: 16.5 cm x 8.5 cm x 17.8 cm with a total volume of 1306.9 cm^3 (411 cm^3 is upper limit normal) Portal Vein Occlusion/Thrombus: Yes, occlusion Splenic Vein Occlusion/Thrombus: No Ascites: None Varices: Large recanalized  umbilical vein is noted, with echogenic thrombus near the liver. IMPRESSION: 1. Heterogeneous liver echotexture with multiple hyperechoic masses as above. Further evaluation with dedicated liver MRI is recommended when clinical situation permits. 2. Portal vein occlusion as above, with only the proximal aspect of the main portal vein demonstrating patency on Doppler evaluation. 3. Large recanalized umbilical vein, with echogenic thrombus near the liver. 4. Marked splenomegaly. Electronically Signed   By: Ozell Daring M.D.   On: 06/08/2024 16:08   CT Angio Chest/Abd/Pel for Dissection W and/or Wo Contrast Result Date: 06/08/2024 EXAM: CTA CHEST, ABDOMEN AND PELVIS WITH AND WITHOUT CONTRAST 06/08/2024 08:19:56 AM TECHNIQUE: CTA of the chest was performed with and without the administration of intravenous contrast. CTA of the abdomen and pelvis was performed with and without the administration of intravenous contrast. Multiplanar reformatted images are provided for review. MIP images are provided for review. Automated exposure control, iterative reconstruction, and/or weight based adjustment of the mA/kV was utilized to reduce the radiation dose to as low as reasonably achievable. COMPARISON: None available. CLINICAL HISTORY: Acute aortic syndrome (AAS) suspected. Patient ambulatory to triage with steady gait, without difficulty or distress noted; pt reports awakening at 3am with generalized abd pain accomp by N/V; denies hx of same; Hx of heart stents. 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 There is moderate calcific plaque present throughout the thoracic aorta. The descending thoracic aorta is tortuous. The proximal descending thoracic aorta measures approximately 3.5 cm in diameter and the distal descending thoracic aorta approximately 3.1 cm in diameter. There is no  evidence of acute aortic syndrome. PULMONARY ARTERIES: No pulmonary embolism with the limits of this exam. GREAT VESSELS OF AORTIC ARCH: No acute finding. No dissection. No arterial occlusion or significant stenosis. CELIAC TRUNK: No acute finding. No occlusion or significant stenosis. SUPERIOR MESENTERIC ARTERY: No acute finding. No occlusion or significant stenosis. The mesenteric arteries are widely patent. INFERIOR MESENTERIC ARTERY: No acute finding. No occlusion or significant stenosis. RENAL ARTERIES: There is mild stenosis of the origins of the renal arteries bilaterally, worse on the right, which is approximately 50% stenotic. 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 CHEST: MEDIASTINUM: The heart is mildly enlarged and there is moderate to severe calcific coronary artery disease. LUNGS AND PLEURA: There are ground-glass and reticular opacities present dependently within the lungs bilaterally. The nondependent lungs are clear. No evidence of pleural effusion or pneumothorax. THORACIC BONES AND SOFT TISSUES: No acute bone or soft tissue abnormality. ABDOMEN AND PELVIS: LIVER: There is heterogeneous enhancement of the liver and cirrhotic morphology. Hepatic lesions cannot be excluded given the heterogeneous attenuation. There is recanalization of the umbilical vein. GALLBLADDER AND BILE DUCTS: The gallbladder is moderately distended, measuring greater than 13 cm  in length and greater than 5 cm in transverse dimension. There is a calcified stone layering dependently within the gallbladder. There is no definite evidence of cholecystitis. SPLEEN: There is moderate splenomegaly. The spleen measures approximately 16 cm in length. PANCREAS: The pancreas is unremarkable. ADRENAL GLANDS: Bilateral adrenal glands demonstrate no acute abnormality. KIDNEYS, URETERS AND BLADDER: There are simple renal cysts present  bilaterally, including an exophytic cyst on the right measuring nearly 8 cm in diameter. No follow up of the cysts is necessary. No stones in the kidneys or ureters. No hydronephrosis. No perinephric or periureteral stranding. Urinary bladder is unremarkable. GI AND BOWEL: There is a small sliding hiatus hernia. There are few sigmoid diverticula, but no evidence of diverticulitis. Stomach and duodenal sweep demonstrate no acute abnormality. There is no bowel obstruction. No abnormal bowel wall thickening or distension. REPRODUCTIVE: Reproductive organs are unremarkable. PERITONEUM AND RETROPERITONEUM: No ascites or free air. LYMPH NODES: No lymphadenopathy. ABDOMINAL BONES AND SOFT TISSUES: No acute abnormality of the bones. No acute soft tissue abnormality. IMPRESSION: 1. No evidence of acute aortic syndrome. 2. 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 3. Moderate calcific plaque throughout the thoracic aorta. 4. Mild stenosis of the origins of the renal arteries bilaterally, worse on the right (approximately 50% stenotic). 5. Heterogeneous enhancement of the liver with cirrhotic morphology. Hepatic lesions cannot be excluded. Follow up MRI of the liver without and with gadolinium contrast is suggested. 6. Moderate splenomegaly, measuring approximately 16 cm in length. 7. Small sliding hiatus hernia. Electronically signed by: evalene coho 06/08/2024 09:19 AM EDT RP Workstation: [id number]    ECHO as above.  TELEMETRY reviewed by me 06/09/2024: Sinus rhythm, rate 70s  EKG reviewed by me: sinus bradycardia, rate 43 bpm without acute ischemic changes.   Data reviewed by me 06/09/2024: last 24h vitals tele labs imaging I/O hospitalist progress notes.  Principal Problem:   Abdominal pain    ASSESSMENT AND PLAN:  Nicholas Abbott is a 73 y.o. male   with a past medical history of coronary artery disease s/p stent to LAD-D2 (04/2022), hypertension, hyperlipidemia, hx bradycardia, obesity, OSA, T2DM, liver cirrhosis, chronic thrombocytopenia who presented to the ED on 06/08/2024 for acute onset abdominal pain and upper back pain. Cardiology was consulted for further evaluation.   # Abdominal/back pain # Chronic liver cirrhosis  Patient has history of chronic liver cirrhosis.  CTA with no evidence of acute aortic syndrome.  Pain appears MSK related as it worsens with certain positions and movements. WBC elevated at 25.7.  - Management per primary team.  # Coronary artery disease s/p stent to LAD-D2 (2023) # Hypertension # Hyperlipidemia Patient is without chest pain.  EKG without acute ischemic changes.  Troponins negative x 2. Echo this admission with pEF (60-65%), no RWMA, mod LVH, grade I diastolic dysfunction, trivial MR/AR. - Continue home aspirin  81 mg. - Recommend resuming home statin if LFTs at baseline with chronic liver cirrhosis. - Continue home lisinopril  20 mg-HCTZ 12.5 mg daily. - Continue home metoprolol  succinate 12.5 mg daily.  Hold off increasing due to baseline bradycardia. - No plan for invasive cardiac diagnostics at this time.  No further cardiac recommendations at this time. Cardiology will sign off. Please haiku with questions or re-engage if needed.   This patient's plan of care was discussed and created with Dr. Florencio and he is  in agreement.  Signed: Dorene Comfort, PA-C  06/09/2024, 1:49 PM [facility] Cardiology

## 2024-06-09 NOTE — Care Management Obs Status (Signed)
 MEDICARE OBSERVATION STATUS NOTIFICATION   Patient Details  Name: Nicholas Abbott MRN: [id number] Date of Birth: 24-Jan-1951   Medicare Observation Status Notification Given:  Yes    [name] 06/09/2024, 11:40 AM

## 2024-06-09 NOTE — Consult Note (Signed)
 [facility] (initial) - cpt: [id number]    HISTORY OF PRESENT ILLNESS (HPI):  73 y.o. male presented to [facility] yesterday for evaluation of severe and acute interscapular back pain. Patient reports having pizza prior to this episode. Extensive ED work-up as noted below.  Now WBC rising, and RUQ pain/tenderness persists, worse with trial of eating today.  Recalls having RUQ pain intermittently over the last months.   Known hepatic issues/cirrhosis, along with recannulization/thrombosis of umbilical artery.  Splenomegaly with thrombocytopenia.    Surgery is consulted by hospitalist physician [facility] in this context for evaluation and management of acute calculus cholecystitis.  Reports after f/u with his cardiologist was taken off of his anti-platelet med.   PAST MEDICAL HISTORY (PMH):  Past Medical History:  Diagnosis Date   Basal cell carcinoma    L ear, txted in past   Diabetes mellitus without complication (HCC)    GERD (gastroesophageal reflux disease)    Hyperlipidemia    Hypertension    Left shoulder pain    Myocardial infarction (HCC)      PAST SURGICAL HISTORY (PSH):  Past Surgical History:  Procedure Laterality Date   CATARACT EXTRACTION     COLONOSCOPY WITH PROPOFOL  N/A 01/01/2020   Procedure: COLONOSCOPY WITH PROPOFOL ;  Surgeon: [name], [name], [facility];  Location: [facility] ENDOSCOPY;  Service: Gastroenterology;  Laterality: N/A;   CORONARY ANGIOPLASTY     ESOPHAGOGASTRODUODENOSCOPY (EGD) WITH PROPOFOL  N/A 01/01/2020   Procedure: ESOPHAGOGASTRODUODENOSCOPY (EGD) WITH PROPOFOL ;  Surgeon: [name], [name], [facility];  Location: [facility] ENDOSCOPY;  Service: Gastroenterology;  Laterality: N/A;   EYE SURGERY     LEFT HEART CATH AND CORONARY ANGIOGRAPHY N/A 04/29/2022   Procedure: LEFT HEART CATH AND CORONARY ANGIOGRAPHY;  Surgeon: [name], [facility];  [facility]: [facility];  Service: Cardiovascular;  Laterality: N/A;   RETINAL DETACHMENT  SURGERY       MEDICATIONS:  Prior to Admission medications   Medication Sig Start Date End Date Taking? Authorizing Provider  acetaminophen  (TYLENOL ) 500 MG tablet Take 1,000 mg by mouth every 8 (eight) hours as needed.   Yes Provider, Historical, Abbott  aspirin  EC 81 MG tablet Take 81 mg by mouth daily.   Yes Provider, Historical, Abbott  Cinnamon 500 MG capsule Take 1,000 mg by mouth daily.   Yes Provider, Historical, Abbott  Cyanocobalamin  (VITAMIN B-12) 5000 MCG TBDP Take 5,000 mcg by mouth daily.    Yes Provider, Historical, Abbott  empagliflozin  (JARDIANCE ) 10 MG TABS tablet Take 1 tablet by mouth daily with breakfast. 02/16/22  Yes Provider, Historical, Abbott  ezetimibe (ZETIA) 10 MG tablet Take 10 mg by mouth daily. 04/06/24 04/06/25 Yes Provider, Historical, Abbott  folic acid -pyridoxine -cyancobalamin (FOLTX) 2.5-25-2 MG TABS tablet Take 1 tablet by mouth daily. 02/08/17  Yes Provider, Historical, Abbott  hydrocortisone  2.5 % cream APPLY TO THE GROIN DAILY ON MONDAY, WEDNESDAY, AND FRIDAY. 01/27/23  Yes [facility]  ketoconazole  (NIZORAL ) 2 % cream APPLY TO THE GROIN AT BEDTIME 12/20/23  Yes [facility]  levothyroxine  (SYNTHROID ) 112 MCG tablet Take 112 mcg by mouth daily. 02/20/22  Yes Provider, Historical, Abbott  liothyronine  (CYTOMEL ) 5 MCG tablet Take 5 mcg by mouth every morning.   Yes Provider, Historical, Abbott  lisinopril -hydrochlorothiazide  (ZESTORETIC ) 20-12.5 MG tablet Take 1 tablet by mouth daily. 04/06/23 06/08/24 Yes Provider, Historical, [facility]  metoprolol  succinate (TOPROL -XL) 25 MG 24 hr tablet Take 0.5 tablets (12.5 mg total) by mouth daily. 04/29/22 06/08/24 Yes  [name], [facility]  nitroGLYCERIN  (NITROSTAT ) 0.4 MG SL tablet Place 0.4 mg under the tongue every 5 (five) minutes as needed for chest pain. 05/01/22  Yes Provider, Historical, [facility]  rosuvastatin  (CRESTOR ) 40 MG tablet Take by mouth. 04/23/22  Yes Provider, Historical, Abbott  tadalafil (CIALIS) 5 MG tablet Take 5 mg by mouth daily as needed  for erectile dysfunction. 08/31/22  Yes Provider, Historical, Abbott  traMADol  (ULTRAM ) 50 MG tablet Take 50 mg by mouth daily as needed for severe pain or moderate pain. 02/05/21  Yes Provider, Historical, Abbott  Blood Glucose Monitoring Suppl DEVI 1 each by Does not apply route in the morning, at noon, and at bedtime. May substitute to any manufacturer covered by patient's insurance. 01/22/23   [facility]  clopidogrel  (PLAVIX ) 75 MG tablet Take 75 mg by mouth daily. Patient not taking: Reported on 06/08/2024    Provider, Historical, Abbott  PHOSPHATIDYLSERINE PO Take by mouth.    Provider, Historical, Abbott     ALLERGIES:  Allergies  Allergen Reactions   Pioglitazone Rash     SOCIAL HISTORY:  Social History   Socioeconomic History   Marital status: Married    Spouse name: Not on file   Number of children: Not on file   Years of education: Not on file   Highest education level: Not on file  Occupational History   Not on file  Tobacco Use   Smoking status: Never   Smokeless tobacco: Never  Vaping Use   Vaping status: Never Used  Substance and Sexual Activity   Alcohol use: Yes    Alcohol/week: 5.0 standard drinks of alcohol    Types: 5 Shots of liquor per week   Drug use: Never   Sexual activity: Yes    Birth control/protection: Post-menopausal  Other Topics Concern   Not on file  Social History Narrative   Not on file   Social Drivers of Health   Financial Resource Strain: Low Risk  (03/07/2024)   Received from [facility]   Overall Financial Resource Strain (CARDIA)    Difficulty of Paying Living Expenses: Not hard at all  Food Insecurity: No Food Insecurity (06/08/2024)   Hunger Vital Sign    Worried About Running Out of Food in the Last Year: Never true    Ran Out of Food in the Last Year: Never true  Transportation Needs: No Transportation Needs (06/08/2024)   PRAPARE - Administrator, Civil Service (Medical): No    Lack of Transportation  (Non-Medical): No  Physical Activity: Not on file  Stress: Not on file  Social Connections: Socially Integrated (06/08/2024)   Social Connection and Isolation Panel    Frequency of Communication with Friends and Family: More than three times a week    Frequency of Social Gatherings with Friends and Family: More than three times a week    Attends Religious Services: 1 to 4 times per year    Active Member of [employer] or Organizations: Yes    Attends Engineer, structural: More than 4 times per year    Marital Status: Married  Catering manager Violence: Not At Risk (06/08/2024)   Humiliation, Afraid, Rape, and Kick questionnaire    Fear of Current or Ex-Partner: No    Emotionally Abused: No    Physically Abused: No    Sexually Abused: No     FAMILY HISTORY:  Family History  Problem Relation Age of Onset   Leukemia Father  VITAL SIGNS:  Temp:  [98.3 F (36.8 C)-100.9 F (38.3 C)] 98.9 F (37.2 C) (08/08 1511) Pulse Rate:  [72-109] 76 (08/08 1511) Resp:  [15-19] 19 (08/08 1511) BP: (93-127)/(54-95) 114/54 (08/08 1511) SpO2:  [92 %-95 %] 95 % (08/08 1511) FiO2 (%):  [21 %] 21 % (08/07 2118)     Height: 6' 2 (188 cm) Weight: 110.7 kg BMI (Calculated): 31.31   INTAKE/OUTPUT:  08/07 0701 - 08/08 0700 In: 550 [P.O.:550] Out: -   PHYSICAL EXAM:  Physical Exam Blood pressure (!) 114/54, pulse 76, temperature 98.9 F (37.2 C), temperature source Oral, resp. rate 19, height 6' 2 (1.88 m), weight 110.7 kg, SpO2 95%. Last Weight  Most recent update: 06/08/2024  6:37 AM    Weight  110.7 kg (244 lb)             CONSTITUTIONAL: Well developed, and nourished, appropriately responsive and aware without distress.   EARS, NOSE, MOUTH AND THROAT:  The oropharynx is clear. Oral mucosa is pink and moist.  Hearing is intact to voice. Wears CPAP nasal mask.  NECK: Trachea is midline, and there is no jugular venous distension.  LYMPH NODES:  Lymph nodes in the neck are not  enlarged. RESPIRATORY:  Normal respiratory effort without pathologic use of accessory muscles. CARDIOVASCULAR:  Well perfused.  GI: The abdomen is mildly tender to RUQ at region of distended GB on imaging, palpable gallbladder fundus, otherwise soft, nontender, and nondistended. There were no other palpable masses. MUSCULOSKELETAL:  Symmetrical muscle tone appreciated in all four extremities.  Warm without edema.  SKIN: Skin turgor is normal. No pathologic skin lesions appreciated.  NEUROLOGIC:  Motor and sensation appear grossly normal.  Cranial nerves are grossly without defect. PSYCH:  Alert and oriented to person, place and time. Affect is appropriate for situation.  Data Reviewed I have personally reviewed what is currently available of the patient's imaging, recent labs and medical records.    Labs:     Latest Ref Rng & Units 06/09/2024    3:23 AM 06/08/2024    7:11 AM 04/14/2024    9:32 AM  CBC  WBC 4.0 - 10.5 K/uL 25.7  12.4  10.5   Hemoglobin 13.0 - 17.0 g/dL 85.0  84.5  84.0   Hematocrit 39.0 - 52.0 % 43.0  44.9  46.2   Platelets 150 - 400 K/uL 83  91  101       Latest Ref Rng & Units 06/09/2024    3:23 AM 06/08/2024    8:10 AM 06/08/2024    7:11 AM  CMP  Glucose 70 - 99 mg/dL 853   847   BUN 8 - 23 mg/dL 27   20   Creatinine 9.38 - 1.24 mg/dL 8.32  9.09  9.01   Sodium 135 - 145 mmol/L 135   138   Potassium 3.5 - 5.1 mmol/L 4.6   4.8   Chloride 98 - 111 mmol/L 105   104   CO2 22 - 32 mmol/L 23   21   Calcium  8.9 - 10.3 mg/dL 9.0   9.5   Total Protein 6.5 - 8.1 g/dL 5.7   6.3   Total Bilirubin 0.0 - 1.2 mg/dL 2.3   2.0   Alkaline Phos 38 - 126 U/L 86   92   AST 15 - 41 U/L 93   119   ALT 0 - 44 U/L 91   95      Imaging studies:   Last  24 hrs: MR LIVER W WO CONTRAST Result Date: 06/09/2024 CLINICAL DATA:  Hypoechoic liver masses seen on US  and CT. EXAM: MRI ABDOMEN WITHOUT AND WITH CONTRAST TECHNIQUE: Multiplanar multisequence MR imaging of the abdomen was performed both  before and after the administration of intravenous contrast. CONTRAST:  10mL GADAVIST  GADOBUTROL  1 MMOL/ML IV SOLN COMPARISON:  CT angiography chest, abdomen and pelvis from 06/08/2024. FINDINGS: Lower chest: There are linear areas of scarring/atelectasis in the visualized bilateral lungs. Otherwise unremarkable MR appearance to the lung bases. No pleural effusion. No pericardial effusion. Normal heart size. Hepatobiliary: There is cirrhotic liver morphology. There are innumerable mildly T2 hyperintense lesions throughout the liver occupying more than 25% of the total liver parenchyma. The lesions exhibit arterial hyperenhancement and washout on delayed/equilibrium phase images. Findings favor multifocal HCC. The left portal vein appears expanded with loss of signal void on T2 weighted images. However, there is no measurable contrast enhancement on the postcontrast images. Findings therefore favor acute probable bland thrombus. However, attention on follow-up examination is recommended. There is dilated and recanalized umbilical vein, suggesting sequela of portal hypertension. There is diminutive right portal vein however otherwise patent. The gallbladder is distended with width up to 5.9 cm. There is a stone in the gallbladder neck region. There is mild-to-moderate pericholecystic fat stranding and fluid, which is new since the prior study. In appropriate clinical setting, findings favor acute cholecystitis. No intra or extrahepatic bile duct dilation. No choledocholithiasis. Pancreas: There is a 7 x 10 mm T2 hyperintense nonenhancing structure in the pancreatic head. There are multiple additional subcentimeter sized T2 hyperintense nonenhancing structures in the uncinate process, head, neck and body region of the pancreas (marked with electronic arrow sign on series 3). There is no direct communication of these lesions with main pancreatic duct. 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 Spleen: Moderately enlarged spleen measuring 10.2 x 16.2 cm orthogonally on coronal plane. No focal lesion. Adrenals/Urinary Tract: Unremarkable adrenal glands. No hydroureteronephrosis. There are multiple simple cysts throughout bilateral kidneys with largest arising from the right kidney laterally measuring up to 7.4 x 7.6 cm. Stomach/Bowel: There is a tiny sliding hiatal hernia. Visualized portions within the abdomen are unremarkable. No disproportionate dilation of bowel loops. Vascular/Lymphatic: No pathologically enlarged lymph nodes identified. No abdominal aortic aneurysm demonstrated. There is mild ascites mainly in the perihepatic and perisplenic region. No walled-off abscess seen. Other:  None. Musculoskeletal: No suspicious bone lesions identified. IMPRESSION: 1. Cirrhotic liver morphology with innumerable lesions throughout the liver occupying more than 25% of the total liver parenchyma. Findings favor multifocal HCC. There is acute probable bland thrombus in the left portal vein. However, attention on follow-up examination is recommended. 2. There is a 7 x 10 mm T2 hyperintense nonenhancing structure in the pancreatic head. There are multiple additional subcentimeter sized T2 hyperintense nonenhancing structures in the uncinate process, head, neck and body region of the pancreas. 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 3. There is a stone in the gallbladder neck region. There is new, mild-to-moderate pericholecystic fat stranding and fluid. Findings favor acute cholecystitis. 4. There is mild ascites mainly in the perihepatic and perisplenic region. No walled-off abscess seen. 5. Multiple other nonacute observations, as described above. Electronically Signed   By: Ree Molt M.D.   On: 06/09/2024 16:32   ECHOCARDIOGRAM COMPLETE Result Date: 06/09/2024    ECHOCARDIOGRAM REPORT   Patient Name:   [facility] Date  of  Exam: 06/09/2024 Medical Rec #:  [id number]       Height:       74.0 in Accession #:    [id number]      Weight:       244.0 lb Date of Birth:  [birth year]       BSA:          2.365 m Patient Age:    73 years        BP:           102/56 mmHg Patient Gender: M               HR:           84 bpm. Exam Location:  [facility] Procedure: 2D Echo, Cardiac Doppler and Color Doppler (Both Spectral and Color            Flow Doppler were utilized during procedure). Indications:     Chest pain R07.9  History:         Patient has no prior history of Echocardiogram examinations.                  Previous Myocardial Infarction; Risk Factors:Diabetes and                  Hypertension.  Sonographer:     Christopher Furnace Referring Phys:  [id number] CORT ONEIDA MANA Diagnosing Phys: Cara JONETTA Lovelace [facility] IMPRESSIONS  1. Left ventricular ejection fraction, by estimation, is 60 to 65%. The left ventricle has normal function. The left ventricle has no regional wall motion abnormalities. There is moderate asymmetric left ventricular hypertrophy of the septal segment. Left ventricular diastolic parameters are consistent with Grade I diastolic dysfunction (impaired relaxation).  2. Right ventricular systolic function is normal. The right ventricular size is normal. Mildly increased right ventricular wall thickness.  3. The mitral valve is normal in structure. Trivial mitral valve regurgitation.  4. The aortic valve is normal in structure. Aortic valve regurgitation is trivial. FINDINGS  Left Ventricle: Left ventricular ejection fraction, by estimation, is 60 to 65%. The left ventricle has normal function. The left ventricle has no regional wall motion abnormalities. Strain was performed and the global longitudinal strain is indeterminate. The left ventricular internal cavity size was small. There is moderate asymmetric left ventricular hypertrophy of the septal segment. Left ventricular diastolic parameters are consistent with Grade I diastolic dysfunction  (impaired relaxation). Right Ventricle: The right ventricular size is normal. Mildly increased right ventricular wall thickness. Right ventricular systolic function is normal. Left Atrium: Left atrial size was normal in size. Right Atrium: Right atrial size was normal in size. Pericardium: There is no evidence of pericardial effusion. Mitral Valve: The mitral valve is normal in structure. Trivial mitral valve regurgitation. MV peak gradient, 4.8 mmHg. The mean mitral valve gradient is 2.0 mmHg. Tricuspid Valve: The tricuspid valve is normal in structure. Tricuspid valve regurgitation is mild. Aortic Valve: The aortic valve is normal in structure. Aortic valve regurgitation is trivial. Aortic valve mean gradient measures 5.0 mmHg. Aortic valve peak gradient measures 8.5 mmHg. Aortic valve area, by VTI measures 5.01 cm. Pulmonic Valve: The pulmonic valve was normal in structure. Pulmonic valve regurgitation is not visualized. Aorta: The ascending aorta was not well visualized. IAS/Shunts: No atrial level shunt detected by color flow Doppler. Additional Comments: 3D was performed not requiring image post processing on an independent workstation and was indeterminate.  LEFT VENTRICLE PLAX 2D LVIDd:  4.20 cm   Diastology LVIDs:         2.80 cm   LV e' medial:    6.42 cm/s LV PW:         1.20 cm   LV E/e' medial:  8.8 LV IVS:        1.90 cm   LV e' lateral:   8.92 cm/s LVOT diam:     2.30 cm   LV E/e' lateral: 6.3 LV SV:         120 LV SV Index:   51 LVOT Area:     4.15 cm  RIGHT VENTRICLE RV Basal diam:  3.30 cm RV Mid diam:    2.60 cm LEFT ATRIUM             Index        RIGHT ATRIUM           Index LA diam:        4.10 cm 1.73 cm/m   RA Area:     10.70 cm LA Vol (A2C):   45.5 ml 19.24 ml/m  RA Volume:   19.10 ml  8.08 ml/m LA Vol (A4C):   27.7 ml 11.71 ml/m LA Biplane Vol: 38.4 ml 16.24 ml/m  AORTIC VALVE AV Area (Vmax):    4.10 cm AV Area (Vmean):   4.10 cm AV Area (VTI):     5.01 cm AV Vmax:            146.00 cm/s AV Vmean:          107.500 cm/s AV VTI:            0.240 m AV Peak Grad:      8.5 mmHg AV Mean Grad:      5.0 mmHg LVOT Vmax:         144.00 cm/s LVOT Vmean:        106.000 cm/s LVOT VTI:          0.290 m LVOT/AV VTI ratio: 1.21  AORTA Ao Root diam: 3.60 cm MITRAL VALVE               TRICUSPID VALVE MV Area (PHT): 2.10 cm    TR Peak grad:   9.1 mmHg MV Area VTI:   4.56 cm    TR Vmax:        151.00 cm/s MV Peak grad:  4.8 mmHg MV Mean grad:  2.0 mmHg    SHUNTS MV Vmax:       1.09 m/s    Systemic VTI:  0.29 m MV Vmean:      59.8 cm/s   Systemic Diam: 2.30 cm MV Decel Time: 361 msec MV E velocity: 56.60 cm/s MV A velocity: 87.00 cm/s MV E/A ratio:  0.65 Dwayne D Callwood [facility] Electronically signed by Cara JONETTA Lo[facility]ace [facility] Signature Date/Time: 06/09/2024/1:47:37 PM    Final      Assessment/Plan:  73 y.o. male with likely acute calculous cholecystitis, complicated by pertinent comorbidities including:  Patient Active Problem List   Diagnosis Date Noted   Abdominal pain 06/08/2024   Closed nondisplaced fracture of fifth cervical vertebra with routine healing 10/25/2023   Pain in joint of right shoulder 10/25/2023   OSA on CPAP 08/30/2023   CPAP use counseling 08/30/2023   S/P angioplasty with stent 05/01/2022   GERD (gastroesophageal reflux disease) 04/28/2021   Sleep apnea 04/28/2021   Hyperlipidemia 04/28/2021   Hyperlipidemia    Hypertension    Diabetes mellitus without complication (HCC)  CAD (coronary artery disease)    Thrombocytopenia (HCC)    Acute metabolic encephalopathy    Hypoglycemia    Type 2 diabetes mellitus without complication, without long-term current use of insulin  (HCC) 02/11/2017   Combined arterial insufficiency and corporo-venous occlusive erectile dysfunction 02/10/2017   Coronary artery disease 06/06/2014   Hypertension 06/06/2014   Obesity 05/30/2012   Myocardial infarction [facility]) 03/02/2000    - Proceed with robotic cholecystectomy.  - Hold all  antiplatelet therapy.  - Ordered ICG and n.p.o. after midnight.  This was discussed thoroughly.  Optimal plan is for robotic cholecystectomy utilizing ICG imaging, may obtain accessible liver biopsy in same setting, however somewhat guarded due to cirrhosis and thrombocytopenia.. Risks and benefits have been discussed with the patient which include but are not limited to anesthesia, bleeding, infection, biliary ductal injury, resulting in leak or stenosis, other associated unanticipated injuries affiliated with laparoscopic surgery.  Options of percutaneous drainage, reviewed. Reviewed that removing the gallbladder will only address the symptoms related to the gallbladder itself.  I believe there is the desire to proceed, accepting the risks with understanding.  Questions elicited and answered to satisfaction.    No guarantees ever expressed or implied.   All of the above findings and recommendations were discussed with the patient and  family(if present), and all of patient's and present family's questions were answered to their expressed satisfaction.  I personally spent a total of 75 minutes in the care of the patient today including preparing to see the patient, getting/reviewing separately obtained history, performing a medically appropriate exam/evaluation, counseling and educating, placing orders, referring and communicating with other health care professionals, documenting clinical information in the EHR, independently interpreting results, and communicating results.    Thank you for the opportunity to participate in this patient's care.   -- [name], M.D., FACS 06/09/2024, 5:21 PM

## 2024-06-09 NOTE — Progress Notes (Signed)
*  PRELIMINARY RESULTS* Echocardiogram 2D Echocardiogram has been performed.  [facility] 06/09/2024, 8:33 AM

## 2024-06-09 NOTE — Progress Notes (Signed)
 PROGRESS NOTE    Nicholas Abbott  FMW:[id number] DOB: 11/09/1950 DOA: 06/08/2024 PCP: Sherial Bail, MD    Brief Narrative:    73 y.o. male with medical history significant of multivessel CAD status post stenting, HTN, HLD, OSA on CPAP, liver cirrhosis, chronic thrombocytopenia, obesity, presented with new onset of abdominal pain and back pain.     Patient woke up this morning with 7-8/10 dull like abdominal pain and new onset of back pain, both pain has been constant, nonradiating, abdominal pain associated with feeling of nausea but no vomiting.  Patient had a normal bowel movement yesterday and she ate same home-cooked food with his wife yesterday evening and the wife has been okay.  Patient has a history of significant CAD MI many years ago and he described that when he had the MI, his main symptoms was back pain.  But he said also that today's back pain is different in nature compared to MRI associated back pain years ago.  Patient does have a history of cirrhosis with chronic transaminitis and bilirubinemia for which he has been following with PCP, he denied cirrhosis is related to alcohol drinking claiming that he drinks only occasionally and even after stopped drinking alcohol for 2 months his liver enzymes remains elevated.  He was recommended to see GI for further workup recently.  Assessment & Plan:   Principal Problem:   Abdominal pain Acute abdominal pain Back pain - No structural abnormality found to explain his symptoms, differential is wide, patient does have a chronic cirrhosis, but image study does not show any significant ascites to implying acute pathology such as SBP, and clinically and image study was low suspicion for bowel obstruction.  Ultrasound with Doppler demonstrated some umbilical vein thrombosis but unclear whether this is related to his symptoms.  Back pain is also of unclear etiology.  Reported to be midthoracic without radiation.  Symptoms have  improved. Plan: Dedicated liver MRI ordered to follow-up on hypoechoic lesions noted on abdominal ultrasound.  Multimodal pain control.  AKI Suspect prerenal azotemia.  Kidney function worsened since 8/7.  Meets criteria for AKI.  Will need intravenous fluids. Plan: Normal saline 75 cc/h x 24 hours Recheck kidney function in a.m.  Leukocytosis Fever Tmax 100.9.  Noted on 8/7.  White count margin elevated at 12.4 on arrival.  Subsequently up to 25. Plan: No clear source of infection at this time.  Fever Tmax 100.9.  Possible intra-abdominal source.  Procalcitonin markedly elevated.  Will initiate Rocephin  and Flagyl  for suspected intra-abdominal source of infection.  Check MRSA screen.  Check blood cultures prior to initiation of antibiotic.     Liver cirrhosis Chronic transaminitis Chronic bilirubinemia Chronic thrombocytopenia - Unknown etiology, alcoholic?,  NASH? Autoimune? - Appears to be stable, no significant impairment of synthetic function of liver isolation normal albumin level and INR within normal limits   CAD - Continue aspirin  Plavix    HTN - Continue hydrochlorothiazide /lisinopril    Sinus bradycardia Frequent PVCs - Compared to his recent office record, baseline heart rate in 40-60s and blood pressure stable.  As per recommendation from his cardiology at [facility], will continue low-dose of metoprolol    Alcohol abuse - Appears to have binge drinking habit but denied ever abusing large amount. - Currently there is no symptoms signs of alcohol withdrawal   DVT prophylaxis: SCD Code Status: Full Family Communication: None Disposition Plan: Status is: Observation The patient will require care spanning > 2 midnights and should be moved to inpatient because: AKI,  fever, leukocytosis, sepsis/infection of unknown source   Level of care: Telemetry Medical  Consultants:  None  Procedures:  None  Antimicrobials: Rocephin  Flagyl    Subjective: Seen and examined.   Resting in bed.  Reports improvement in abdominal pain and back pain.  Objective: Vitals:   06/08/24 2232 06/09/24 0327 06/09/24 0329 06/09/24 0937  BP:  (!) 93/56 (!) 102/56 102/65  Pulse:  72 84 77  Resp:  16  18  Temp: 98.7 F (37.1 C) 98.8 F (37.1 C)  98.3 F (36.8 C)  TempSrc:  Oral  Oral  SpO2:  95% 94% 95%  Weight:      Height:        Intake/Output Summary (Last 24 hours) at 06/09/2024 1230 Last data filed at 06/09/2024 0937 Gross per 24 hour  Intake 670 ml  Output --  Net 670 ml   Filed Weights   06/08/24 0636  Weight: 110.7 kg    Examination:  General exam: Appears calm and comfortable  Respiratory system: Clear to auscultation. Respiratory effort normal. Cardiovascular system: S1-S2, RRR, no murmurs, no pedal edema Gastrointestinal system: Soft, NT/ND, normal bowel sounds Central nervous system: Alert and oriented. No focal neurological deficits. Extremities: Symmetric 5 x 5 power. Skin: No rashes, lesions or ulcers Psychiatry: Judgement and insight appear normal. Mood & affect appropriate.     Data Reviewed: I have personally reviewed following labs and imaging studies  CBC: Recent Labs  Lab 06/08/24 0711 06/09/24 0323  WBC 12.4* 25.7*  NEUTROABS 4.0  --   HGB 15.4 14.9  HCT 44.9 43.0  MCV 101.1* 100.7*  PLT 91* 83*   Basic Metabolic Panel: Recent Labs  Lab 06/08/24 0711 06/08/24 0810 06/09/24 0323  NA 138  --  135  K 4.8  --  4.6  CL 104  --  105  CO2 21*  --  23  GLUCOSE 152*  --  146*  BUN 20  --  27*  CREATININE 0.98 0.90 1.67*  CALCIUM  9.5  --  9.0  MG 1.9  --   --    GFR: Estimated Creatinine Clearance: 51.4 mL/min (A) (by C-G formula based on SCr of 1.67 mg/dL (H)). Liver Function Tests: Recent Labs  Lab 06/08/24 0711 06/09/24 0323  AST 119* 93*  ALT 95* 91*  ALKPHOS 92 86  BILITOT 2.0* 2.3*  PROT 6.3* 5.7*  ALBUMIN 3.0* 2.7*   Recent Labs  Lab 06/08/24 0711  LIPASE 48   No results for input(s): AMMONIA in  the last 168 hours. Coagulation Profile: Recent Labs  Lab 06/08/24 1328  INR 1.1   Cardiac Enzymes: No results for input(s): CKTOTAL, CKMB, CKMBINDEX, TROPONINI in the last 168 hours. BNP (last 3 results) No results for input(s): PROBNP in the last 8760 hours. HbA1C: No results for input(s): HGBA1C in the last 72 hours. CBG: No results for input(s): GLUCAP in the last 168 hours. Lipid Profile: No results for input(s): CHOL, HDL, LDLCALC, TRIG, CHOLHDL, LDLDIRECT in the last 72 hours. Thyroid Function Tests: Recent Labs    06/08/24 0711  TSH 5.763*   Anemia Panel: No results for input(s): VITAMINB12, FOLATE, FERRITIN, TIBC, IRON, RETICCTPCT in the last 72 hours. Sepsis Labs: No results for input(s): PROCALCITON, LATICACIDVEN in the last 168 hours.  No results found for this or any previous visit (from the past 240 hours).       Radiology Studies: US  ABDOMEN LIMITED WITH LIVER DOPPLER Result Date: 06/08/2024 CLINICAL DATA:  Abdominal pain, jaundice  EXAM: DUPLEX ULTRASOUND OF LIVER TECHNIQUE: Color and duplex Doppler ultrasound was performed to evaluate the hepatic in-flow and out-flow vessels. COMPARISON:  06/08/2024, 02/17/2022 FINDINGS: Liver: Liver displays diffusely heterogeneous liver echotexture, with multiple hyperechoic masses throughout the liver parenchyma. Index mass within the anterior right lobe liver measures 2.5 x 2.3 x 2.4 cm. Given ultrasound and recent CT findings, further evaluation with dedicated liver CT or MRI is recommended. Liver is enlarged measuring 17.6 cm in the midclavicular line. The gallbladder is moderately distended, with 2.2 cm shadowing gallstone within the gallbladder neck. Gallbladder wall measures up to 4 mm in maximal thickness. Main Portal Vein size: 1.7 cm Portal Vein Velocities Main Prox:  11.9 cm/sec Main Mid: Occluded Main Dist: Occluded Right: Occluded Left: Occluded Hepatic Vein Velocities Right:   19.5 cm/sec Middle:  19.3 cm/sec Left:  24.7 cm/sec Normal directional flow, though waveforms are pulsatile and turbulent. IVC: Present and patent with normal respiratory phasicity. Hepatic Artery Velocity:  41.3 cm/sec Splenic Vein Velocity:  12.2 cm/sec Spleen: 16.5 cm x 8.5 cm x 17.8 cm with a total volume of 1306.9 cm^3 (411 cm^3 is upper limit normal) Portal Vein Occlusion/Thrombus: Yes, occlusion Splenic Vein Occlusion/Thrombus: No Ascites: None Varices: Large recanalized umbilical vein is noted, with echogenic thrombus near the liver. IMPRESSION: 1. Heterogeneous liver echotexture with multiple hyperechoic masses as above. Further evaluation with dedicated liver MRI is recommended when clinical situation permits. 2. Portal vein occlusion as above, with only the proximal aspect of the main portal vein demonstrating patency on Doppler evaluation. 3. Large recanalized umbilical vein, with echogenic thrombus near the liver. 4. Marked splenomegaly. Electronically Signed   By: Ozell Daring M.D.   On: 06/08/2024 16:08   CT Angio Chest/Abd/Pel for Dissection W and/or Wo Contrast Result Date: 06/08/2024 EXAM: CTA CHEST, ABDOMEN AND PELVIS WITH AND WITHOUT CONTRAST 06/08/2024 08:19:56 AM TECHNIQUE: CTA of the chest was performed with and without the administration of intravenous contrast. CTA of the abdomen and pelvis was performed with and without the administration of intravenous contrast. Multiplanar reformatted images are provided for review. MIP images are provided for review. Automated exposure control, iterative reconstruction, and/or weight based adjustment of the mA/kV was utilized to reduce the radiation dose to as low as reasonably achievable. COMPARISON: None available. CLINICAL HISTORY: Acute aortic syndrome (AAS) suspected. Patient ambulatory to triage with steady gait, without difficulty or distress noted; pt reports awakening at 3am with generalized abd pain accomp by N/V; denies hx of same; Hx of  heart stents. 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 There is moderate calcific plaque present throughout the thoracic aorta. The descending thoracic aorta is tortuous. The proximal descending thoracic aorta measures approximately 3.5 cm in diameter and the distal descending thoracic aorta approximately 3.1 cm in diameter. There is no evidence of acute aortic syndrome. PULMONARY ARTERIES: No pulmonary embolism with the limits of this exam. GREAT VESSELS OF AORTIC ARCH: No acute finding. No dissection. No arterial occlusion or significant stenosis. CELIAC TRUNK: No acute finding. No occlusion or significant stenosis. SUPERIOR MESENTERIC ARTERY: No acute finding. No occlusion or significant stenosis. The mesenteric arteries are widely patent. INFERIOR MESENTERIC ARTERY: No acute finding. No occlusion or significant stenosis. RENAL ARTERIES: There is mild stenosis of the origins of the renal arteries bilaterally, worse on the right, which is approximately 50% stenotic. 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 CHEST: MEDIASTINUM: The heart is mildly enlarged and there is moderate to severe calcific coronary artery disease. LUNGS AND PLEURA: There are ground-glass and reticular opacities present dependently within the lungs bilaterally. The nondependent lungs are clear. No evidence of pleural effusion or pneumothorax. THORACIC BONES AND SOFT TISSUES: No acute bone or soft tissue abnormality. ABDOMEN AND PELVIS: LIVER: There is heterogeneous enhancement of the liver and cirrhotic morphology. Hepatic lesions cannot be excluded given the heterogeneous attenuation. There is recanalization of the umbilical vein. GALLBLADDER AND BILE DUCTS: The gallbladder is moderately distended,  measuring greater than 13 cm in length and greater than 5 cm in transverse dimension. There is a calcified stone layering dependently within the gallbladder. There is no definite evidence of cholecystitis. SPLEEN: There is moderate splenomegaly. The spleen measures approximately 16 cm in length. PANCREAS: The pancreas is unremarkable. ADRENAL GLANDS: Bilateral adrenal glands demonstrate no acute abnormality. KIDNEYS, URETERS AND BLADDER: There are simple renal cysts present bilaterally, including an exophytic cyst on the right measuring nearly 8 cm in diameter. No follow up of the cysts is necessary. No stones in the kidneys or ureters. No hydronephrosis. No perinephric or periureteral stranding. Urinary bladder is unremarkable. GI AND BOWEL: There is a small sliding hiatus hernia. There are few sigmoid diverticula, but no evidence of diverticulitis. Stomach and duodenal sweep demonstrate no acute abnormality. There is no bowel obstruction. No abnormal bowel wall thickening or distension. REPRODUCTIVE: Reproductive organs are unremarkable. PERITONEUM AND RETROPERITONEUM: No ascites or free air. LYMPH NODES: No lymphadenopathy. ABDOMINAL BONES AND SOFT TISSUES: No acute abnormality of the bones. No acute soft tissue abnormality. IMPRESSION: 1. No evidence of acute aortic syndrome. 2. 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 3. Moderate calcific plaque throughout the thoracic aorta. 4. Mild stenosis of the origins of the renal arteries bilaterally, worse on the right (approximately 50% stenotic). 5. Heterogeneous enhancement of the liver with cirrhotic morphology. Hepatic lesions cannot be excluded. Follow up MRI of the liver without and with gadolinium contrast is suggested. 6. Moderate splenomegaly, measuring approximately 16 cm in length. 7. Small sliding  hiatus hernia. Electronically signed by: evalene coho 06/08/2024 09:19 AM EDT RP Workstation: [id number]        Scheduled Meds:  aspirin  EC  81 mg Oral Daily   [START ON 06/10/2024] empagliflozin   10 mg Oral Q breakfast   levothyroxine   112 mcg Oral QAC breakfast   lidocaine   2 patch Transdermal Q24H   liothyronine   5 mcg Oral q morning   metoprolol  succinate  12.5 mg Oral Daily   Continuous Infusions:  sodium chloride  75 mL/hr at 06/09/24 1040     LOS: 0 days    Calvin KATHEE Robson, MD [facility]   If 7PM-7AM, please contact night-coverage  06/09/2024, 12:30 PM

## 2024-06-09 NOTE — Plan of Care (Signed)

## 2024-06-09 NOTE — H&P (View-Only) (Signed)
 [name] (initial) - cpt: 99255    HISTORY OF PRESENT ILLNESS (HPI):  73 y.o. male presented to [name] yesterday for evaluation of severe and acute interscapular back pain. Patient reports having pizza prior to this episode. Extensive ED work-up as noted below.  Now WBC rising, and RUQ pain/tenderness persists, worse with trial of eating today.  Recalls having RUQ pain intermittently over the last months.   Known hepatic issues/cirrhosis, along with recannulization/thrombosis of umbilical artery.  Splenomegaly with thrombocytopenia.    Surgery is consulted by hospitalist physician [name] in this context for evaluation and management of acute calculus cholecystitis.  Reports after f/u with his cardiologist was taken off of his anti-platelet med.   PAST MEDICAL HISTORY (PMH):  Past Medical History:  Diagnosis Date   Basal cell carcinoma    L ear, txted in past   Diabetes mellitus without complication (HCC)    GERD (gastroesophageal reflux disease)    Hyperlipidemia    Hypertension    Left shoulder pain    Myocardial infarction (HCC)      PAST SURGICAL HISTORY (PSH):  Past Surgical History:  Procedure Laterality Date   CATARACT EXTRACTION     COLONOSCOPY WITH PROPOFOL  N/A 01/01/2020   Procedure: COLONOSCOPY WITH PROPOFOL ;  Surgeon: [name], [name], MD;  Location: [facility] ENDOSCOPY;  Service: Gastroenterology;  Laterality: N/A;   CORONARY ANGIOPLASTY     ESOPHAGOGASTRODUODENOSCOPY (EGD) WITH PROPOFOL  N/A 01/01/2020   Procedure: ESOPHAGOGASTRODUODENOSCOPY (EGD) WITH PROPOFOL ;  Surgeon: [name], [name], MD;  Location: [facility] ENDOSCOPY;  Service: Gastroenterology;  Laterality: N/A;   EYE SURGERY     LEFT HEART CATH AND CORONARY ANGIOGRAPHY N/A 04/29/2022   Procedure: LEFT HEART CATH AND CORONARY ANGIOGRAPHY;  Surgeon: [name], MD;  Location: [facility] INVASIVE CV LAB;  Service: Cardiovascular;  Laterality: N/A;   RETINAL DETACHMENT  SURGERY       MEDICATIONS:  Prior to Admission medications   Medication Sig Start Date End Date Taking? Authorizing Provider  acetaminophen  (TYLENOL ) 500 MG tablet Take 1,000 mg by mouth every 8 (eight) hours as needed.   Yes [provider]  aspirin  EC 81 MG tablet Take 81 mg by mouth daily.   Yes [provider]  Cinnamon 500 MG capsule Take 1,000 mg by mouth daily.   Yes [provider]  Cyanocobalamin  (VITAMIN B-12) 5000 MCG TBDP Take 5,000 mcg by mouth daily.    Yes [provider]  empagliflozin  (JARDIANCE ) 10 MG TABS tablet Take 1 tablet by mouth daily with breakfast. 02/16/22  Yes [provider]  ezetimibe (ZETIA) 10 MG tablet Take 10 mg by mouth daily. 04/06/24 04/06/25 Yes [provider]  folic acid -pyridoxine -cyancobalamin (FOLTX) 2.5-25-2 MG TABS tablet Take 1 tablet by mouth daily. 02/08/17  Yes [provider]  hydrocortisone  2.5 % cream APPLY TO THE GROIN DAILY ON MONDAY, WEDNESDAY, AND FRIDAY. 01/27/23  Yes [name], MD  ketoconazole  (NIZORAL ) 2 % cream APPLY TO THE GROIN AT BEDTIME 12/20/23  Yes [name], MD  levothyroxine  (SYNTHROID ) 112 MCG tablet Take 112 mcg by mouth daily. 02/20/22  Yes [provider]  liothyronine  (CYTOMEL ) 5 MCG tablet Take 5 mcg by mouth every morning.   Yes [provider]  lisinopril -hydrochlorothiazide  (ZESTORETIC ) 20-12.5 MG tablet Take 1 tablet by mouth daily. 04/06/23 06/08/24 Yes [provider]  metoprolol  succinate (TOPROL -XL) 25 MG 24 hr tablet Take 0.5 tablets (12.5 mg total) by mouth daily. 04/29/22 06/08/24 Yes  [name], MD  nitroGLYCERIN  (NITROSTAT ) 0.4 MG SL tablet Place 0.4 mg under the tongue every 5 (five) minutes as needed for chest pain. 05/01/22  Yes [provider]  rosuvastatin  (CRESTOR ) 40 MG tablet Take by mouth. 04/23/22  Yes [provider]  tadalafil (CIALIS) 5 MG tablet Take 5 mg by mouth daily as needed  for erectile dysfunction. 08/31/22  Yes [provider]  traMADol  (ULTRAM ) 50 MG tablet Take 50 mg by mouth daily as needed for severe pain or moderate pain. 02/05/21  Yes [provider]  Blood Glucose Monitoring Suppl DEVI 1 each by Does not apply route in the morning, at noon, and at bedtime. May substitute to any manufacturer covered by patient's insurance. 01/22/23   [name], MD  clopidogrel  (PLAVIX ) 75 MG tablet Take 75 mg by mouth daily. Patient not taking: Reported on 06/08/2024    [provider]  PHOSPHATIDYLSERINE PO Take by mouth.    [provider]     ALLERGIES:  Allergies  Allergen Reactions   Pioglitazone Rash     SOCIAL HISTORY:  Social History   Socioeconomic History   Marital status: Married    Spouse name: Not on file   Number of children: Not on file   Years of education: Not on file   Highest education level: Not on file  Occupational History   Not on file  Tobacco Use   Smoking status: Never   Smokeless tobacco: Never  Vaping Use   Vaping status: Never Used  Substance and Sexual Activity   Alcohol use: Yes    Alcohol/week: 5.0 standard drinks of alcohol    Types: 5 Shots of liquor per week   Drug use: Never   Sexual activity: Yes    Birth control/protection: Post-menopausal  Other Topics Concern   Not on file  Social History Narrative   Not on file   Social Drivers of Health   Financial Resource Strain: Low Risk  (03/07/2024)   Received from [facility]   Overall Financial Resource Strain (CARDIA)    Difficulty of Paying Living Expenses: Not hard at all  Food Insecurity: No Food Insecurity (06/08/2024)   Hunger Vital Sign    Worried About Running Out of Food in the Last Year: Never true    Ran Out of Food in the Last Year: Never true  Transportation Needs: No Transportation Needs (06/08/2024)   PRAPARE - Administrator, Civil Service (Medical): No    Lack of Transportation  (Non-Medical): No  Physical Activity: Not on file  Stress: Not on file  Social Connections: Socially Integrated (06/08/2024)   Social Connection and Isolation Panel    Frequency of Communication with Friends and Family: More than three times a week    Frequency of Social Gatherings with Friends and Family: More than three times a week    Attends Religious Services: 1 to 4 times per year    Active Member of [employer] or Organizations: Yes    Attends Engineer, structural: More than 4 times per year    Marital Status: Married  Catering manager Violence: Not At Risk (06/08/2024)   Humiliation, Afraid, Rape, and Kick questionnaire    Fear of Current or Ex-Partner: No    Emotionally Abused: No    Physically Abused: No    Sexually Abused: No     FAMILY HISTORY:  Family History  Problem Relation Age of Onset   Leukemia Father  VITAL SIGNS:  Temp:  [98.3 F (36.8 C)-100.9 F (38.3 C)] 98.9 F (37.2 C) (08/08 1511) Pulse Rate:  [72-109] 76 (08/08 1511) Resp:  [15-19] 19 (08/08 1511) BP: (93-127)/(54-95) 114/54 (08/08 1511) SpO2:  [92 %-95 %] 95 % (08/08 1511) FiO2 (%):  [21 %] 21 % (08/07 2118)     Height: 6' 2 (188 cm) Weight: 110.7 kg BMI (Calculated): 31.31   INTAKE/OUTPUT:  08/07 0701 - 08/08 0700 In: 550 [P.O.:550] Out: -   PHYSICAL EXAM:  Physical Exam Blood pressure (!) 114/54, pulse 76, temperature 98.9 F (37.2 C), temperature source Oral, resp. rate 19, height 6' 2 (1.88 m), weight 110.7 kg, SpO2 95%. Last Weight  Most recent update: 06/08/2024  6:37 AM    Weight  110.7 kg (244 lb)             CONSTITUTIONAL: Well developed, and nourished, appropriately responsive and aware without distress.   EARS, NOSE, MOUTH AND THROAT:  The oropharynx is clear. Oral mucosa is pink and moist.  Hearing is intact to voice. Wears CPAP nasal mask.  NECK: Trachea is midline, and there is no jugular venous distension.  LYMPH NODES:  Lymph nodes in the neck are not  enlarged. RESPIRATORY:  Normal respiratory effort without pathologic use of accessory muscles. CARDIOVASCULAR:  Well perfused.  GI: The abdomen is mildly tender to RUQ at region of distended GB on imaging, palpable gallbladder fundus, otherwise soft, nontender, and nondistended. There were no other palpable masses. MUSCULOSKELETAL:  Symmetrical muscle tone appreciated in all four extremities.  Warm without edema.  SKIN: Skin turgor is normal. No pathologic skin lesions appreciated.  NEUROLOGIC:  Motor and sensation appear grossly normal.  Cranial nerves are grossly without defect. PSYCH:  Alert and oriented to person, place and time. Affect is appropriate for situation.  Data Reviewed I have personally reviewed what is currently available of the patient's imaging, recent labs and medical records.    Labs:     Latest Ref Rng & Units 06/09/2024    3:23 AM 06/08/2024    7:11 AM 04/14/2024    9:32 AM  CBC  WBC 4.0 - 10.5 K/uL 25.7  12.4  10.5   Hemoglobin 13.0 - 17.0 g/dL 85.0  84.5  84.0   Hematocrit 39.0 - 52.0 % 43.0  44.9  46.2   Platelets 150 - 400 K/uL 83  91  101       Latest Ref Rng & Units 06/09/2024    3:23 AM 06/08/2024    8:10 AM 06/08/2024    7:11 AM  CMP  Glucose 70 - 99 mg/dL 853   847   BUN 8 - 23 mg/dL 27   20   Creatinine 9.38 - 1.24 mg/dL 8.32  9.09  9.01   Sodium 135 - 145 mmol/L 135   138   Potassium 3.5 - 5.1 mmol/L 4.6   4.8   Chloride 98 - 111 mmol/L 105   104   CO2 22 - 32 mmol/L 23   21   Calcium  8.9 - 10.3 mg/dL 9.0   9.5   Total Protein 6.5 - 8.1 g/dL 5.7   6.3   Total Bilirubin 0.0 - 1.2 mg/dL 2.3   2.0   Alkaline Phos 38 - 126 U/L 86   92   AST 15 - 41 U/L 93   119   ALT 0 - 44 U/L 91   95      Imaging studies:   Last  24 hrs: MR LIVER W WO CONTRAST Result Date: 06/09/2024 CLINICAL DATA:  Hypoechoic liver masses seen on US  and CT. EXAM: MRI ABDOMEN WITHOUT AND WITH CONTRAST TECHNIQUE: Multiplanar multisequence MR imaging of the abdomen was performed both  before and after the administration of intravenous contrast. CONTRAST:  10mL GADAVIST  GADOBUTROL  1 MMOL/ML IV SOLN COMPARISON:  CT angiography chest, abdomen and pelvis from 06/08/2024. FINDINGS: Lower chest: There are linear areas of scarring/atelectasis in the visualized bilateral lungs. Otherwise unremarkable MR appearance to the lung bases. No pleural effusion. No pericardial effusion. Normal heart size. Hepatobiliary: There is cirrhotic liver morphology. There are innumerable mildly T2 hyperintense lesions throughout the liver occupying more than 25% of the total liver parenchyma. The lesions exhibit arterial hyperenhancement and washout on delayed/equilibrium phase images. Findings favor multifocal HCC. The left portal vein appears expanded with loss of signal void on T2 weighted images. However, there is no measurable contrast enhancement on the postcontrast images. Findings therefore favor acute probable bland thrombus. However, attention on follow-up examination is recommended. There is dilated and recanalized umbilical vein, suggesting sequela of portal hypertension. There is diminutive right portal vein however otherwise patent. The gallbladder is distended with width up to 5.9 cm. There is a stone in the gallbladder neck region. There is mild-to-moderate pericholecystic fat stranding and fluid, which is new since the prior study. In appropriate clinical setting, findings favor acute cholecystitis. No intra or extrahepatic bile duct dilation. No choledocholithiasis. Pancreas: There is a 7 x 10 mm T2 hyperintense nonenhancing structure in the pancreatic head. There are multiple additional subcentimeter sized T2 hyperintense nonenhancing structures in the uncinate process, head, neck and body region of the pancreas (marked with electronic arrow sign on series 3). There is no direct communication of these lesions with main pancreatic duct. 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 Spleen: Moderately enlarged spleen measuring 10.2 x 16.2 cm orthogonally on coronal plane. No focal lesion. Adrenals/Urinary Tract: Unremarkable adrenal glands. No hydroureteronephrosis. There are multiple simple cysts throughout bilateral kidneys with largest arising from the right kidney laterally measuring up to 7.4 x 7.6 cm. Stomach/Bowel: There is a tiny sliding hiatal hernia. Visualized portions within the abdomen are unremarkable. No disproportionate dilation of bowel loops. Vascular/Lymphatic: No pathologically enlarged lymph nodes identified. No abdominal aortic aneurysm demonstrated. There is mild ascites mainly in the perihepatic and perisplenic region. No walled-off abscess seen. Other:  None. Musculoskeletal: No suspicious bone lesions identified. IMPRESSION: 1. Cirrhotic liver morphology with innumerable lesions throughout the liver occupying more than 25% of the total liver parenchyma. Findings favor multifocal HCC. There is acute probable bland thrombus in the left portal vein. However, attention on follow-up examination is recommended. 2. There is a 7 x 10 mm T2 hyperintense nonenhancing structure in the pancreatic head. There are multiple additional subcentimeter sized T2 hyperintense nonenhancing structures in the uncinate process, head, neck and body region of the pancreas. 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 3. There is a stone in the gallbladder neck region. There is new, mild-to-moderate pericholecystic fat stranding and fluid. Findings favor acute cholecystitis. 4. There is mild ascites mainly in the perihepatic and perisplenic region. No walled-off abscess seen. 5. Multiple other nonacute observations, as described above. Electronically Signed   By: Ree Molt M.D.   On: 06/09/2024 16:32   ECHOCARDIOGRAM COMPLETE Result Date: 06/09/2024    ECHOCARDIOGRAM REPORT   Patient Name:   Nicholas Abbott [facility] Date  of  Exam: 06/09/2024 Medical Rec #:  [id number]       Height:       74.0 in Accession #:    [id number]      Weight:       244.0 lb Date of Birth:  1951/03/01       BSA:          2.365 m Patient Age:    73 years        BP:           102/56 mmHg Patient Gender: M               HR:           84 bpm. Exam Location:  [facility] Procedure: 2D Echo, Cardiac Doppler and Color Doppler (Both Spectral and Color            Flow Doppler were utilized during procedure). Indications:     Chest pain R07.9  History:         Patient has no prior history of Echocardiogram examinations.                  Previous Myocardial Infarction; Risk Factors:Diabetes and                  Hypertension.  Sonographer:     Christopher Furnace Referring Phys:  [id number] CORT ONEIDA MANA Diagnosing Phys: Cara JONETTA Lovelace MD IMPRESSIONS  1. Left ventricular ejection fraction, by estimation, is 60 to 65%. The left ventricle has normal function. The left ventricle has no regional wall motion abnormalities. There is moderate asymmetric left ventricular hypertrophy of the septal segment. Left ventricular diastolic parameters are consistent with Grade I diastolic dysfunction (impaired relaxation).  2. Right ventricular systolic function is normal. The right ventricular size is normal. Mildly increased right ventricular wall thickness.  3. The mitral valve is normal in structure. Trivial mitral valve regurgitation.  4. The aortic valve is normal in structure. Aortic valve regurgitation is trivial. FINDINGS  Left Ventricle: Left ventricular ejection fraction, by estimation, is 60 to 65%. The left ventricle has normal function. The left ventricle has no regional wall motion abnormalities. Strain was performed and the global longitudinal strain is indeterminate. The left ventricular internal cavity size was small. There is moderate asymmetric left ventricular hypertrophy of the septal segment. Left ventricular diastolic parameters are consistent with Grade I diastolic dysfunction  (impaired relaxation). Right Ventricle: The right ventricular size is normal. Mildly increased right ventricular wall thickness. Right ventricular systolic function is normal. Left Atrium: Left atrial size was normal in size. Right Atrium: Right atrial size was normal in size. Pericardium: There is no evidence of pericardial effusion. Mitral Valve: The mitral valve is normal in structure. Trivial mitral valve regurgitation. MV peak gradient, 4.8 mmHg. The mean mitral valve gradient is 2.0 mmHg. Tricuspid Valve: The tricuspid valve is normal in structure. Tricuspid valve regurgitation is mild. Aortic Valve: The aortic valve is normal in structure. Aortic valve regurgitation is trivial. Aortic valve mean gradient measures 5.0 mmHg. Aortic valve peak gradient measures 8.5 mmHg. Aortic valve area, by VTI measures 5.01 cm. Pulmonic Valve: The pulmonic valve was normal in structure. Pulmonic valve regurgitation is not visualized. Aorta: The ascending aorta was not well visualized. IAS/Shunts: No atrial level shunt detected by color flow Doppler. Additional Comments: 3D was performed not requiring image post processing on an independent workstation and was indeterminate.  LEFT VENTRICLE PLAX 2D LVIDd:  4.20 cm   Diastology LVIDs:         2.80 cm   LV e' medial:    6.42 cm/s LV PW:         1.20 cm   LV E/e' medial:  8.8 LV IVS:        1.90 cm   LV e' lateral:   8.92 cm/s LVOT diam:     2.30 cm   LV E/e' lateral: 6.3 LV SV:         120 LV SV Index:   51 LVOT Area:     4.15 cm  RIGHT VENTRICLE RV Basal diam:  3.30 cm RV Mid diam:    2.60 cm LEFT ATRIUM             Index        RIGHT ATRIUM           Index LA diam:        4.10 cm 1.73 cm/m   RA Area:     10.70 cm LA Vol (A2C):   45.5 ml 19.24 ml/m  RA Volume:   19.10 ml  8.08 ml/m LA Vol (A4C):   27.7 ml 11.71 ml/m LA Biplane Vol: 38.4 ml 16.24 ml/m  AORTIC VALVE AV Area (Vmax):    4.10 cm AV Area (Vmean):   4.10 cm AV Area (VTI):     5.01 cm AV Vmax:            146.00 cm/s AV Vmean:          107.500 cm/s AV VTI:            0.240 m AV Peak Grad:      8.5 mmHg AV Mean Grad:      5.0 mmHg LVOT Vmax:         144.00 cm/s LVOT Vmean:        106.000 cm/s LVOT VTI:          0.290 m LVOT/AV VTI ratio: 1.21  AORTA Ao Root diam: 3.60 cm MITRAL VALVE               TRICUSPID VALVE MV Area (PHT): 2.10 cm    TR Peak grad:   9.1 mmHg MV Area VTI:   4.56 cm    TR Vmax:        151.00 cm/s MV Peak grad:  4.8 mmHg MV Mean grad:  2.0 mmHg    SHUNTS MV Vmax:       1.09 m/s    Systemic VTI:  0.29 m MV Vmean:      59.8 cm/s   Systemic Diam: 2.30 cm MV Decel Time: 361 msec MV E velocity: 56.60 cm/s MV A velocity: 87.00 cm/s MV E/A ratio:  0.65 Dwayne D Callwood MD Electronically signed by Cara JONETTA Lovelace MD Signature Date/Time: 06/09/2024/1:47:37 PM    Final      Assessment/Plan:  73 y.o. male with likely acute calculous cholecystitis, complicated by pertinent comorbidities including:  Patient Active Problem List   Diagnosis Date Noted   Abdominal pain 06/08/2024   Closed nondisplaced fracture of fifth cervical vertebra with routine healing 10/25/2023   Pain in joint of right shoulder 10/25/2023   OSA on CPAP 08/30/2023   CPAP use counseling 08/30/2023   S/P angioplasty with stent 05/01/2022   GERD (gastroesophageal reflux disease) 04/28/2021   Sleep apnea 04/28/2021   Hyperlipidemia 04/28/2021   Hyperlipidemia    Hypertension    Diabetes mellitus without complication (HCC)  CAD (coronary artery disease)    Thrombocytopenia (HCC)    Acute metabolic encephalopathy    Hypoglycemia    Type 2 diabetes mellitus without complication, without long-term current use of insulin  (HCC) 02/11/2017   Combined arterial insufficiency and corporo-venous occlusive erectile dysfunction 02/10/2017   Coronary artery disease 06/06/2014   Hypertension 06/06/2014   Obesity 05/30/2012   Myocardial infarction (HCC) 03/02/2000    - Proceed with robotic cholecystectomy.  - Hold all  antiplatelet therapy.  - Ordered ICG and n.p.o. after midnight.  This was discussed thoroughly.  Optimal plan is for robotic cholecystectomy utilizing ICG imaging, may obtain accessible liver biopsy in same setting, however somewhat guarded due to cirrhosis and thrombocytopenia.. Risks and benefits have been discussed with the patient which include but are not limited to anesthesia, bleeding, infection, biliary ductal injury, resulting in leak or stenosis, other associated unanticipated injuries affiliated with laparoscopic surgery.  Options of percutaneous drainage, reviewed. Reviewed that removing the gallbladder will only address the symptoms related to the gallbladder itself.  I believe there is the desire to proceed, accepting the risks with understanding.  Questions elicited and answered to satisfaction.    No guarantees ever expressed or implied.   All of the above findings and recommendations were discussed with the patient and  family(if present), and all of patient's and present family's questions were answered to their expressed satisfaction.  I personally spent a total of 75 minutes in the care of the patient today including preparing to see the patient, getting/reviewing separately obtained history, performing a medically appropriate exam/evaluation, counseling and educating, placing orders, referring and communicating with other health care professionals, documenting clinical information in the EHR, independently interpreting results, and communicating results.    Thank you for the opportunity to participate in this patient's care.   -- [name], M.D., FACS 06/09/2024, 5:21 PM

## 2024-06-09 NOTE — TOC CM/SW Note (Signed)
 Transition of Care  [facility]) - Inpatient Brief Assessment   Patient Details  Name: Nicholas Abbott MRN: [id number] Date of Birth: [birth year]  Transition of [facility] CM/SW Contact:    Corean ONEIDA Haddock, RN Phone Number: 06/09/2024, 2:47 PM   Clinical Narrative:  Transition of Care [facility]) Screening Note   Patient Details  Name: Nicholas Abbott Date of Birth: 04-Feb-1951   Transition of Care [facility]) CM/SW Contact:    Corean ONEIDA Haddock, RN Phone Number: 06/09/2024, 2:47 PM    Transition of Care Department [facility]) has reviewed patient and no TOC needs have been identified at this time. . If new patient transition needs arise, please place a TOC consult.     Transition of Care Asessment: Insurance and Status: Insurance coverage has been reviewed Patient has primary care physician: Yes     Prior/Current Home Services: No current home services Social Drivers of Health Review: SDOH reviewed no interventions necessary Readmission risk has been reviewed: Yes Transition of care needs: no transition of care needs at this time

## 2024-06-10 ENCOUNTER — Other Ambulatory Visit: Payer: Self-pay

## 2024-06-10 ENCOUNTER — Encounter
Admission: EM | Disposition: A | Discharge status: Home / Self Care | Payer: Self-pay | Source: Home / Self Care | Attending: Internal Medicine

## 2024-06-10 ENCOUNTER — Inpatient Hospital Stay: Admitting: Certified Registered"

## 2024-06-10 DIAGNOSIS — K8000 Calculus of gallbladder with acute cholecystitis without obstruction: Secondary | ICD-10-CM | POA: Diagnosis present

## 2024-06-10 DIAGNOSIS — R160 Hepatomegaly, not elsewhere classified: Secondary | ICD-10-CM | POA: Diagnosis not present

## 2024-06-10 DIAGNOSIS — R1011 Right upper quadrant pain: Secondary | ICD-10-CM | POA: Diagnosis not present

## 2024-06-10 DIAGNOSIS — R188 Other ascites: Secondary | ICD-10-CM | POA: Diagnosis present

## 2024-06-10 DIAGNOSIS — K7460 Unspecified cirrhosis of liver: Secondary | ICD-10-CM | POA: Diagnosis present

## 2024-06-10 LAB — BASIC METABOLIC PANEL WITH GFR
Anion gap: 7 (ref 5–15)
BUN: 36 mg/dL — ABNORMAL HIGH (ref 8–23)
CO2: 21 mmol/L — ABNORMAL LOW (ref 22–32)
Calcium: 8.5 mg/dL — ABNORMAL LOW (ref 8.9–10.3)
Chloride: 105 mmol/L (ref 98–111)
Creatinine, Ser: 1.28 mg/dL — ABNORMAL HIGH (ref 0.61–1.24)
GFR, Estimated: 59 mL/min — ABNORMAL LOW (ref >60)
Glucose, Bld: 148 mg/dL — ABNORMAL HIGH (ref 70–99)
Potassium: 4 mmol/L (ref 3.5–5.1)
Sodium: 133 mmol/L — ABNORMAL LOW (ref 135–145)

## 2024-06-10 LAB — HEMOGLOBIN AND HEMATOCRIT, BLOOD
HCT: 44.7 % (ref 39.0–52.0)
Hemoglobin: 15.3 g/dL (ref 13.0–17.0)

## 2024-06-10 LAB — CBC WITH DIFFERENTIAL/PLATELET
Abs Granulocyte: 11.4 K/uL — ABNORMAL HIGH (ref 1.5–6.5)
Abs Immature Granulocytes: 0.28 K/uL — ABNORMAL HIGH (ref 0.00–0.07)
Basophils Absolute: 0.1 K/uL (ref 0.0–0.1)
Basophils Relative: 0 %
Eosinophils Absolute: 0.1 K/uL (ref 0.0–0.5)
Eosinophils Relative: 1 %
HCT: 39.4 % (ref 39.0–52.0)
Hemoglobin: 14 g/dL (ref 13.0–17.0)
Immature Granulocytes: 1 %
Lymphocytes Relative: 44 %
Lymphs Abs: 10.7 K/uL — ABNORMAL HIGH (ref 0.7–4.0)
MCH: 35.1 pg — ABNORMAL HIGH (ref 26.0–34.0)
MCHC: 35.5 g/dL (ref 30.0–36.0)
MCV: 98.7 fL (ref 80.0–100.0)
Monocytes Absolute: 2 K/uL — ABNORMAL HIGH (ref 0.1–1.0)
Monocytes Relative: 8 %
Neutro Abs: 11.4 K/uL — ABNORMAL HIGH (ref 1.7–7.7)
Neutrophils Relative %: 46 %
Platelets: 75 K/uL — ABNORMAL LOW (ref 150–400)
RBC: 3.99 MIL/uL — ABNORMAL LOW (ref 4.22–5.81)
RDW: 15.7 % — ABNORMAL HIGH (ref 11.5–15.5)
Smear Review: NORMAL
WBC: 24.6 K/uL — ABNORMAL HIGH (ref 4.0–10.5)
nRBC: 0 % (ref 0.0–0.2)

## 2024-06-10 LAB — AFP TUMOR MARKER: AFP, Serum, Tumor Marker: 21.5 ng/mL — ABNORMAL HIGH (ref 0.0–8.4)

## 2024-06-10 LAB — GLUCOSE, CAPILLARY: Glucose-Capillary: 138 mg/dL — ABNORMAL HIGH (ref 70–99)

## 2024-06-10 LAB — PROCALCITONIN: Procalcitonin: 4.56 ng/mL

## 2024-06-10 SURGERY — CHOLECYSTECTOMY, ROBOT-ASSISTED, LAPAROSCOPIC
Anesthesia: General | Site: Abdomen

## 2024-06-10 MED ORDER — SODIUM CHLORIDE 0.9% IV SOLUTION: Freq: Once | INTRAVENOUS | Status: DC

## 2024-06-10 MED ORDER — OXYCODONE HCL 5 MG/5ML PO SOLN: 5.0000 mg | Freq: Once | ORAL | Status: AC | PRN

## 2024-06-10 MED ORDER — ROCURONIUM BROMIDE 100 MG/10ML IV SOLN
INTRAVENOUS | Status: DC | PRN
  Administered 2024-06-10: 20 mg via INTRAVENOUS
  Administered 2024-06-10: 40 mg via INTRAVENOUS
  Administered 2024-06-10: 60 mg via INTRAVENOUS

## 2024-06-10 MED ORDER — PROPOFOL 10 MG/ML IV BOLUS
INTRAVENOUS | Status: AC
Start: 2024-06-10 — End: 2024-06-10
  Filled 2024-06-10: qty 20

## 2024-06-10 MED ORDER — PROPOFOL 10 MG/ML IV BOLUS
INTRAVENOUS | Status: DC | PRN
Start: 2024-06-10 — End: 2024-06-10
  Administered 2024-06-10: 200 mg via INTRAVENOUS

## 2024-06-10 MED ORDER — HYDROMORPHONE HCL 1 MG/ML IJ SOLN
1.0000 mg | INTRAMUSCULAR | Status: DC | PRN
  Administered 2024-06-10 – 2024-06-20 (×10): 1 mg via INTRAVENOUS
  Filled 2024-06-10 (×10): qty 1

## 2024-06-10 MED ORDER — FENTANYL CITRATE (PF) 100 MCG/2ML IJ SOLN
25.0000 ug | INTRAMUSCULAR | Status: DC | PRN
  Administered 2024-06-10: 50 ug via INTRAVENOUS
  Administered 2024-06-10 (×2): 25 ug via INTRAVENOUS

## 2024-06-10 MED ORDER — BUPIVACAINE-EPINEPHRINE (PF) 0.25% -1:200000 IJ SOLN
INTRAMUSCULAR | Status: DC | PRN
Start: 2024-06-10 — End: 2024-06-10
  Administered 2024-06-10: 30 mL

## 2024-06-10 MED ORDER — DEXMEDETOMIDINE HCL IN NACL 80 MCG/20ML IV SOLN
INTRAVENOUS | Status: DC | PRN
  Administered 2024-06-10: 12 ug via INTRAVENOUS
  Administered 2024-06-10: 8 ug via INTRAVENOUS

## 2024-06-10 MED ORDER — ACETAMINOPHEN 10 MG/ML IV SOLN
INTRAVENOUS | Status: AC
  Filled 2024-06-10: qty 100

## 2024-06-10 MED ORDER — INDOCYANINE GREEN 25 MG IV SOLR
1.2500 mg | Freq: Once | INTRAVENOUS | Status: AC
  Administered 2024-06-10: 1.25 mg via INTRAVENOUS
  Filled 2024-06-10: qty 10

## 2024-06-10 MED ORDER — OXYCODONE HCL 5 MG PO TABS
ORAL_TABLET | ORAL | Status: AC
  Filled 2024-06-10: qty 1

## 2024-06-10 MED ORDER — INDOCYANINE GREEN 25 MG IV SOLR
INTRAVENOUS | Status: AC
  Filled 2024-06-10: qty 10

## 2024-06-10 MED ORDER — FENTANYL CITRATE (PF) 100 MCG/2ML IJ SOLN
INTRAMUSCULAR | Status: AC
Start: 2024-06-10 — End: 2024-06-10
  Filled 2024-06-10: qty 2

## 2024-06-10 MED ORDER — PHENYLEPHRINE 80 MCG/ML (10ML) SYRINGE FOR IV PUSH (FOR BLOOD PRESSURE SUPPORT)
PREFILLED_SYRINGE | INTRAVENOUS | Status: DC | PRN
Start: 2024-06-10 — End: 2024-06-10
  Administered 2024-06-10 (×3): 160 ug via INTRAVENOUS

## 2024-06-10 MED ORDER — FENTANYL CITRATE (PF) 100 MCG/2ML IJ SOLN
INTRAMUSCULAR | Status: DC | PRN
  Administered 2024-06-10 (×2): 50 ug via INTRAVENOUS

## 2024-06-10 MED ORDER — 0.9 % SODIUM CHLORIDE (POUR BTL) OPTIME
TOPICAL | Status: DC | PRN
  Administered 2024-06-10: 500 mL

## 2024-06-10 MED ORDER — SODIUM CHLORIDE 0.9 % IV SOLN: INTRAVENOUS | Status: DC

## 2024-06-10 MED ORDER — ONDANSETRON HCL 4 MG/2ML IJ SOLN
INTRAMUSCULAR | Status: DC | PRN
  Administered 2024-06-10: 4 mg via INTRAVENOUS

## 2024-06-10 MED ORDER — DEXAMETHASONE SODIUM PHOSPHATE 10 MG/ML IJ SOLN
INTRAMUSCULAR | Status: DC | PRN
  Administered 2024-06-10: 10 mg via INTRAVENOUS

## 2024-06-10 MED ORDER — OXYCODONE HCL 5 MG PO TABS
5.0000 mg | ORAL_TABLET | Freq: Once | ORAL | Status: AC | PRN
  Administered 2024-06-10: 5 mg via ORAL

## 2024-06-10 MED ORDER — SUGAMMADEX SODIUM 500 MG/5ML IV SOLN
INTRAVENOUS | Status: DC | PRN
Start: 2024-06-10 — End: 2024-06-10
  Administered 2024-06-10: 400 mg via INTRAVENOUS

## 2024-06-10 MED ORDER — LACTATED RINGERS IV SOLN: INTRAVENOUS | Status: DC | PRN

## 2024-06-10 MED ORDER — LIDOCAINE HCL (CARDIAC) PF 100 MG/5ML IV SOSY
PREFILLED_SYRINGE | INTRAVENOUS | Status: DC | PRN
  Administered 2024-06-10: 100 mg via INTRAVENOUS

## 2024-06-10 MED ORDER — FENTANYL CITRATE (PF) 100 MCG/2ML IJ SOLN
INTRAMUSCULAR | Status: AC
  Filled 2024-06-10: qty 2

## 2024-06-10 SURGICAL SUPPLY — 45 items
BAG PRESSURE INF REUSE 3000 (BAG) IMPLANT
CLIP LIGATING HEM O LOK PURPLE (MISCELLANEOUS) ×1 IMPLANT
COVER TIP SHEARS 8 DVNC (MISCELLANEOUS) ×1 IMPLANT
DEFOGGER SCOPE WARM SEASHARP (MISCELLANEOUS) ×1 IMPLANT
DERMABOND ADVANCED .7 DNX12 (GAUZE/BANDAGES/DRESSINGS) ×1 IMPLANT
DRAIN CHANNEL JP 19F RND 3/16 (MISCELLANEOUS) IMPLANT
DRAPE ARM DVNC X/XI (DISPOSABLE) ×4 IMPLANT
DRAPE COLUMN DVNC XI (DISPOSABLE) ×1 IMPLANT
DRIVER NDL LRG 8 DVNC XI (INSTRUMENTS) IMPLANT
DRIVER NDLE LRG 8 DVNC XI (INSTRUMENTS) ×1 IMPLANT
ELECTRODE REM PT RTRN 9FT ADLT (ELECTROSURGICAL) ×1 IMPLANT
EVACUATOR SILICONE 100CC (DRAIN) IMPLANT
FORCEPS BPLR R/ABLATION 8 DVNC (INSTRUMENTS) ×1 IMPLANT
FORCEPS PROGRASP DVNC XI (FORCEP) ×1 IMPLANT
GLOVE ORTHO TXT STRL SZ7.5 (GLOVE) ×2 IMPLANT
GOWN STRL REUS W/ TWL LRG LVL3 (GOWN DISPOSABLE) ×2 IMPLANT
GOWN STRL REUS W/ TWL XL LVL3 (GOWN DISPOSABLE) ×2 IMPLANT
GRASPER SUT TROCAR 14GX15 (MISCELLANEOUS) ×1 IMPLANT
IRRIGATION STRYKERFLOW (MISCELLANEOUS) IMPLANT
IRRIGATOR SUCT 8 DISP DVNC XI (IRRIGATION / IRRIGATOR) IMPLANT
KIT PINK PAD W/HEAD ARM REST (MISCELLANEOUS) ×1 IMPLANT
LABEL OR SOLS (LABEL) ×1 IMPLANT
MANIFOLD NEPTUNE II (INSTRUMENTS) ×1 IMPLANT
NDL HYPO 22X1.5 SAFETY MO (MISCELLANEOUS) ×1 IMPLANT
NDL INSUFFLATION 14GA 120MM (NEEDLE) ×1 IMPLANT
NEEDLE HYPO 22X1.5 SAFETY MO (MISCELLANEOUS) ×1 IMPLANT
NEEDLE INSUFFLATION 14GA 120MM (NEEDLE) ×1 IMPLANT
NS IRRIG 500ML POUR BTL (IV SOLUTION) ×1 IMPLANT
PACK LAP CHOLECYSTECTOMY (MISCELLANEOUS) ×1 IMPLANT
SCISSORS MNPLR CVD DVNC XI (INSTRUMENTS) ×1 IMPLANT
SEAL UNIV 5-12 XI (MISCELLANEOUS) ×4 IMPLANT
SET TUBE SMOKE EVAC HIGH FLOW (TUBING) ×1 IMPLANT
SOL .9 NS 3000ML IRR UROMATIC (IV SOLUTION) IMPLANT
SOLUTION ELECTROSURG ANTI STCK (MISCELLANEOUS) ×1 IMPLANT
SPIKE FLUID TRANSFER (MISCELLANEOUS) ×1 IMPLANT
SPONGE DRAIN TRACH 4X4 STRL 2S (GAUZE/BANDAGES/DRESSINGS) IMPLANT
SUT STRATAFIX SPIRAL PDS3-0 (SUTURE) IMPLANT
SUT VICRYL 0 UR6 27IN ABS (SUTURE) ×1 IMPLANT
SUTURE EHLN 3-0 FS-10 30 BLK (SUTURE) IMPLANT
SUTURE MNCRL 4-0 27XMF (SUTURE) ×1 IMPLANT
SYRINGE TOOMEY IRRIG 70ML (MISCELLANEOUS) IMPLANT
SYSTEM BAG RETRIEVAL 10MM (BASKET) ×1 IMPLANT
TRAP FLUID SMOKE EVACUATOR (MISCELLANEOUS) ×1 IMPLANT
TROCAR Z-THREAD FIOS 11X100 BL (TROCAR) ×1 IMPLANT
WATER STERILE IRR 500ML POUR (IV SOLUTION) ×1 IMPLANT

## 2024-06-10 NOTE — Consult Note (Signed)
 Hematology/Oncology Consult note [facility] [phone] [phone]  Patient Care Team: Sherial Bail, MD as PCP - General (Internal Medicine) Jacobo Evalene PARAS, MD as Consulting Physician (Oncology)   Name of the patient: Nicholas Abbott  [id number]  [birth year]    Reason for consult: Multiple liver lesions   Requesting physician: [name]  Date of visit: 06/10/2024    History of presenting illness- Patient is a 73 year old male with a past medical history significant for hypertension hyperlipidemia obstructive sleep apnea on CPAP and liver cirrhosis with chronic thrombocytopenia.  He presented to the ER with symptoms of significant abdominal pain and underwent CT angio chest and pelvis abdomen CT.  CT showed no evidence of acute aortic syndrome.  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  Moderate splenomegaly of 16 cm.  Cirrhotic morphology and liver lesions could not be excluded.  Ultrasound abdomen showed portal vein occlusion and marked splenomegaly.  MRI liver with and without contrast showed innumerable lesions throughout the liver occupying more than 25% of the total parenchyma with findings favoring multifocal HCC.  Bland thrombus in the left portal vein.  10 mm nonenhancing structure in the pancreatic head.  Stone in the gallbladder neck region with findings of acute cholecystitis.    Review of systems- Review of Systems  Constitutional:  Negative for chills, fever, malaise/fatigue and weight loss.  HENT:  Negative for congestion, ear discharge and nosebleeds.   Eyes:  Negative for blurred vision.  Respiratory:  Negative for cough, hemoptysis, sputum production, shortness of breath and wheezing.   Cardiovascular:  Negative for chest pain, palpitations, orthopnea and claudication.  Gastrointestinal:  Negative for abdominal pain, blood in stool, constipation, diarrhea, heartburn, melena, nausea and vomiting.   Genitourinary:  Negative for dysuria, flank pain, frequency, hematuria and urgency.  Musculoskeletal:  Negative for back pain, joint pain and myalgias.  Skin:  Negative for rash.  Neurological:  Negative for dizziness, tingling, focal weakness, seizures, weakness and headaches.  Endo/Heme/Allergies:  Does not bruise/bleed easily.  Psychiatric/Behavioral:  Negative for depression and suicidal ideas. The patient does not have insomnia.     Allergies  Allergen Reactions   Pioglitazone Rash    Patient Active Problem List   Diagnosis Date Noted   Calculus of gallbladder with acute cholecystitis without obstruction 06/10/2024   Cirrhosis of liver with ascites (HCC) 06/10/2024   Liver masses 06/10/2024   Abdominal pain 06/08/2024   Closed nondisplaced fracture of fifth cervical vertebra with routine healing 10/25/2023   Pain in joint of right shoulder 10/25/2023   OSA on CPAP 08/30/2023   CPAP use counseling 08/30/2023   S/P angioplasty with stent 05/01/2022   GERD (gastroesophageal reflux disease) 04/28/2021   Sleep apnea 04/28/2021   Hyperlipidemia 04/28/2021   Hyperlipidemia    Hypertension    Diabetes mellitus without complication (HCC)    CAD (coronary artery disease)    Thrombocytopenia (HCC)    Acute metabolic encephalopathy    Hypoglycemia    Type 2 diabetes mellitus without complication, without long-term current use of insulin  (HCC) 02/11/2017   Combined arterial insufficiency and corporo-venous occlusive erectile dysfunction 02/10/2017   Coronary artery disease 06/06/2014   Hypertension 06/06/2014   Obesity 05/30/2012   Myocardial infarction (HCC) 03/02/2000     Past Medical History:  Diagnosis Date   Basal cell carcinoma    L ear, txted in past   Diabetes mellitus without complication (HCC)    GERD (gastroesophageal reflux disease)  Hyperlipidemia    Hypertension    Left shoulder pain    Myocardial infarction [facility])      Past Surgical History:   Procedure Laterality Date   CATARACT EXTRACTION     COLONOSCOPY WITH PROPOFOL  N/A 01/01/2020   Procedure: COLONOSCOPY WITH PROPOFOL ;  Surgeon: [name], [name], MD;  Location: [facility] ENDOSCOPY;  Service: Gastroenterology;  Laterality: N/A;   CORONARY ANGIOPLASTY     ESOPHAGOGASTRODUODENOSCOPY (EGD) WITH PROPOFOL  N/A 01/01/2020   Procedure: ESOPHAGOGASTRODUODENOSCOPY (EGD) WITH PROPOFOL ;  Surgeon: [name], [name], MD;  Location: [facility] ENDOSCOPY;  Service: Gastroenterology;  Laterality: N/A;   EYE SURGERY     LEFT HEART CATH AND CORONARY ANGIOGRAPHY N/A 04/29/2022   Procedure: LEFT HEART CATH AND CORONARY ANGIOGRAPHY;  Surgeon: [name], MD;  Location: [facility] INVASIVE CV LAB;  Service: Cardiovascular;  Laterality: N/A;   RETINAL DETACHMENT SURGERY      Social History   Socioeconomic History   Marital status: Married    Spouse name: Not on file   Number of children: Not on file   Years of education: Not on file   Highest education level: Not on file  Occupational History   Not on file  Tobacco Use   Smoking status: Never   Smokeless tobacco: Never  Vaping Use   Vaping status: Never Used  Substance and Sexual Activity   Alcohol use: Yes    Alcohol/week: 5.0 standard drinks of alcohol    Types: 5 Shots of liquor per week   Drug use: Never   Sexual activity: Yes    Birth control/protection: Post-menopausal  Other Topics Concern   Not on file  Social History Narrative   Not on file   Social Drivers of Health   Financial Resource Strain: Low Risk  (03/07/2024)   Received from [facility]   Overall Financial Resource Strain (CARDIA)    Difficulty of Paying Living Expenses: Not hard at all  Food Insecurity: No Food Insecurity (06/08/2024)   Hunger Vital Sign    Worried About Running Out of Food in the Last Year: Never true    Ran Out of Food in the Last Year: Never true  Transportation Needs: No Transportation Needs (06/08/2024)   PRAPARE - Therapist, art (Medical): No    Lack of Transportation (Non-Medical): No  Physical Activity: Not on file  Stress: Not on file  Social Connections: Socially Integrated (06/08/2024)   Social Connection and Isolation Panel    Frequency of Communication with Friends and Family: More than three times a week    Frequency of Social Gatherings with Friends and Family: More than three times a week    Attends Religious Services: 1 to 4 times per year    Active Member of [employer] or Organizations: Yes    Attends Engineer, structural: More than 4 times per year    Marital Status: Married  Catering manager Violence: Not At Risk (06/08/2024)   Humiliation, Afraid, Rape, and Kick questionnaire    Fear of Current or Ex-Partner: No    Emotionally Abused: No    Physically Abused: No    Sexually Abused: No     Family History  Problem Relation Age of Onset   Leukemia Father      Current Facility-Administered Medications:    0.9 %  sodium chloride  infusion, , Intravenous, Continuous, Sreenath, Sudheer B, MD, Last Rate: 75 mL/hr at 06/10/24 1020, New Bag at 06/10/24 1020   0.9 %  irrigation (POUR BTL), , , PRN, Lane Shope, MD, 500 mL at 06/10/24 1507   [MAR Hold] acetaminophen  (TYLENOL ) tablet 650 mg, 650 mg, Oral, Q6H PRN, Zhang, Ping T, MD, 650 mg at 06/08/24 1903   bupivacaine -epinephrine  (PF) (MARCAINE  W/ EPI) 0.25% -1:200000 injection, , , PRN, Lane Shope, MD, 30 mL at 06/10/24 1507   [MAR Hold] cefTRIAXone  (ROCEPHIN ) 2 g in sodium chloride  0.9 % 100 mL IVPB, 2 g, Intravenous, Q24H, Sreenath, Sudheer B, MD, Last Rate: 200 mL/hr at 06/10/24 1228, 2 g at 06/10/24 1228   [MAR Hold] empagliflozin  (JARDIANCE ) tablet 10 mg, 10 mg, Oral, Q breakfast, Sreenath, Sudheer B, MD   [MAR Hold] HYDROmorphone  (DILAUDID ) injection 0.5 mg, 0.5 mg, Intravenous, Q3H PRN, Jhonny, Sudheer B, MD   [MAR Hold] levothyroxine  (SYNTHROID ) tablet 112 mcg, 112 mcg, Oral, QAC breakfast, Laurita Manor T, MD,  112 mcg at 06/10/24 9347   [facility] Hold] lidocaine  (LIDODERM ) 5 % 2 patch, 2 patch, Transdermal, Q24H, Laurita Manor T, MD, 2 patch at 06/08/24 1543   [MAR Hold] liothyronine  (CYTOMEL ) tablet 5 mcg, 5 mcg, Oral, q morning, Sreenath, Sudheer B, MD, 5 mcg at 06/10/24 9347   [facility] Hold] methocarbamol  (ROBAXIN ) injection 1,000 mg, 1,000 mg, Intravenous, Q8H PRN, Jhonny, Sudheer B, MD, 1,000 mg at 06/09/24 1259   [MAR Hold] metoprolol  succinate (TOPROL -XL) 24 hr tablet 12.5 mg, 12.5 mg, Oral, Daily, Zhang, Manor T, MD   [MAR Hold] metroNIDAZOLE  (FLAGYL ) IVPB 500 mg, 500 mg, Intravenous, Q12H, Sreenath, Sudheer B, MD, Last Rate: 100 mL/hr at 06/10/24 0129, 500 mg at 06/10/24 0129   [MAR Hold] nitroGLYCERIN  (NITROSTAT ) SL tablet 0.4 mg, 0.4 mg, Sublingual, Q5 min PRN, [name], MD   [facility] Hold] ondansetron  (ZOFRAN ) tablet 4 mg, 4 mg, Oral, Q6H PRN, 4 mg at 06/08/24 1246 **OR** [MAR Hold] ondansetron  (ZOFRAN ) injection 4 mg, 4 mg, Intravenous, Q6H PRN, Laurita Manor T, MD   [MAR Hold] traMADol  (ULTRAM ) tablet 50-100 mg, 50-100 mg, Oral, Q6H PRN, Jhonny, Sudheer B, MD, 50 mg at 06/10/24 1225  Facility-Administered Medications Ordered in Other Encounters:    dexamethasone  (DECADRON ) injection, , Intravenous, Anesthesia Intra-op, Landy Hone D, CRNA, 10 mg at 06/10/24 1455   fentaNYL  (SUBLIMAZE ) injection, , Intravenous, Anesthesia Intra-op, Green, Rustin D, CRNA, 50 mcg at 06/10/24 1542   lactated ringers  infusion, , Intravenous, Continuous PRN, Landy Hone D, CRNA, New Bag at 06/10/24 1434   lidocaine  (cardiac) 100 mg/11mL (XYLOCAINE ) injection 2%, , Intravenous, Anesthesia Intra-op, Landy Hone D, CRNA, 100 mg at 06/10/24 1443   PHENYLephrine  80 mcg/ml in normal saline Adult IV Push Syringe (For Blood Pressure Support), , Intravenous, Anesthesia Intra-op, Landy Hone D, CRNA, 160 mcg at 06/10/24 1549   propofol  (DIPRIVAN ) 10 mg/mL bolus/IV push, , Intravenous, Anesthesia Intra-op, Landy Hone D,  CRNA, 200 mg at 06/10/24 1443   rocuronium  (ZEMURON ) injection, , Intravenous, Anesthesia Intra-op, Landy Hone D, CRNA, 40 mg at 06/10/24 1542   Physical exam:  Vitals:   06/09/24 1511 06/09/24 2107 06/10/24 0456 06/10/24 0726  BP: (!) 114/54 114/63 108/69 112/65  Pulse: 76 88 66 71  Resp: 19 15 17 17   Temp: 98.9 F (37.2 C) 98.6 F (37 C) 98.2 F (36.8 C) 99.1 F (37.3 C)  TempSrc: Oral   Oral  SpO2: 95% 93% 92% 93%  Weight:      Height:       Physical Exam HENT:     Head:     Comments: He is using a nasal  bipap Cardiovascular:     Rate and Rhythm: Normal rate.     Heart sounds: Normal heart sounds.  Pulmonary:     Effort: Pulmonary effort is normal.     Breath sounds: Normal breath sounds.  Abdominal:     General: Bowel sounds are normal.     Palpations: Abdomen is soft.     Comments: JP drain in place. Laparoscopy scars are seen  Skin:    General: Skin is warm and dry.  Neurological:     Mental Status: He is alert and oriented to person, place, and time.           Latest Ref Rng & Units 06/10/2024    4:37 AM  CMP  Glucose 70 - 99 mg/dL 851   BUN 8 - 23 mg/dL 36   Creatinine 9.38 - 1.24 mg/dL 8.71   Sodium 864 - 854 mmol/L 133   Potassium 3.5 - 5.1 mmol/L 4.0   Chloride 98 - 111 mmol/L 105   CO2 22 - 32 mmol/L 21   Calcium  8.9 - 10.3 mg/dL 8.5       Latest Ref Rng & Units 06/10/2024    4:37 AM  CBC  WBC 4.0 - 10.5 K/uL 24.6   Hemoglobin 13.0 - 17.0 g/dL 85.9   Hematocrit 60.9 - 52.0 % 39.4   Platelets 150 - 400 K/uL 75     @IMAGES @  MR LIVER W WO CONTRAST Result Date: 06/09/2024 CLINICAL DATA:  Hypoechoic liver masses seen on US  and CT. EXAM: MRI ABDOMEN WITHOUT AND WITH CONTRAST TECHNIQUE: Multiplanar multisequence MR imaging of the abdomen was performed both before and after the administration of intravenous contrast. CONTRAST:  10mL GADAVIST  GADOBUTROL  1 MMOL/ML IV SOLN COMPARISON:  CT angiography chest, abdomen and pelvis from 06/08/2024.  FINDINGS: Lower chest: There are linear areas of scarring/atelectasis in the visualized bilateral lungs. Otherwise unremarkable MR appearance to the lung bases. No pleural effusion. No pericardial effusion. Normal heart size. Hepatobiliary: There is cirrhotic liver morphology. There are innumerable mildly T2 hyperintense lesions throughout the liver occupying more than 25% of the total liver parenchyma. The lesions exhibit arterial hyperenhancement and washout on delayed/equilibrium phase images. Findings favor multifocal HCC. The left portal vein appears expanded with loss of signal void on T2 weighted images. However, there is no measurable contrast enhancement on the postcontrast images. Findings therefore favor acute probable bland thrombus. However, attention on follow-up examination is recommended. There is dilated and recanalized umbilical vein, suggesting sequela of portal hypertension. There is diminutive right portal vein however otherwise patent. The gallbladder is distended with width up to 5.9 cm. There is a stone in the gallbladder neck region. There is mild-to-moderate pericholecystic fat stranding and fluid, which is new since the prior study. In appropriate clinical setting, findings favor acute cholecystitis. No intra or extrahepatic bile duct dilation. No choledocholithiasis. Pancreas: There is a 7 x 10 mm T2 hyperintense nonenhancing structure in the pancreatic head. There are multiple additional subcentimeter sized T2 hyperintense nonenhancing structures in the uncinate process, head, neck and body region of the pancreas (marked with electronic arrow sign on series 3). There is no direct communication of these lesions with main pancreatic duct. 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 Spleen: Moderately enlarged spleen measuring 10.2 x 16.2 cm orthogonally on coronal plane. No focal lesion. Adrenals/Urinary Tract: Unremarkable adrenal glands.  No hydroureteronephrosis. There are multiple simple cysts throughout bilateral kidneys with largest arising from the right  kidney laterally measuring up to 7.4 x 7.6 cm. Stomach/Bowel: There is a tiny sliding hiatal hernia. Visualized portions within the abdomen are unremarkable. No disproportionate dilation of bowel loops. Vascular/Lymphatic: No pathologically enlarged lymph nodes identified. No abdominal aortic aneurysm demonstrated. There is mild ascites mainly in the perihepatic and perisplenic region. No walled-off abscess seen. Other:  None. Musculoskeletal: No suspicious bone lesions identified. IMPRESSION: 1. Cirrhotic liver morphology with innumerable lesions throughout the liver occupying more than 25% of the total liver parenchyma. Findings favor multifocal HCC. There is acute probable bland thrombus in the left portal vein. However, attention on follow-up examination is recommended. 2. There is a 7 x 10 mm T2 hyperintense nonenhancing structure in the pancreatic head. There are multiple additional subcentimeter sized T2 hyperintense nonenhancing structures in the uncinate process, head, neck and body region of the pancreas. 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 3. There is a stone in the gallbladder neck region. There is new, mild-to-moderate pericholecystic fat stranding and fluid. Findings favor acute cholecystitis. 4. There is mild ascites mainly in the perihepatic and perisplenic region. No walled-off abscess seen. 5. Multiple other nonacute observations, as described above. Electronically Signed   By: Ree Molt M.D.   On: 06/09/2024 16:32   ECHOCARDIOGRAM COMPLETE Result Date: 06/09/2024    ECHOCARDIOGRAM REPORT   Patient Name:   LABARRON DURNIN [facility] Date of Exam: 06/09/2024 Medical Rec #:  [id number]       Height:       74.0 in Accession #:    [id number]      Weight:       244.0 lb Date of Birth:  1951-08-19       BSA:          2.365 m Patient  Age:    73 years        BP:           102/56 mmHg Patient Gender: M               HR:           84 bpm. Exam Location:  [facility] Procedure: 2D Echo, Cardiac Doppler and Color Doppler (Both Spectral and Color            Flow Doppler were utilized during procedure). Indications:     Chest pain R07.9  History:         Patient has no prior history of Echocardiogram examinations.                  Previous Myocardial Infarction; Risk Factors:Diabetes and                  Hypertension.  Sonographer:     Christopher Furnace Referring Phys:  [id number] CORT ONEIDA MANA Diagnosing Phys: Cara JONETTA Lovelace MD IMPRESSIONS  1. Left ventricular ejection fraction, by estimation, is 60 to 65%. The left ventricle has normal function. The left ventricle has no regional wall motion abnormalities. There is moderate asymmetric left ventricular hypertrophy of the septal segment. Left ventricular diastolic parameters are consistent with Grade I diastolic dysfunction (impaired relaxation).  2. Right ventricular systolic function is normal. The right ventricular size is normal. Mildly increased right ventricular wall thickness.  3. The mitral valve is normal in structure. Trivial mitral valve regurgitation.  4. The aortic valve is normal in structure. Aortic valve regurgitation is trivial. FINDINGS  Left Ventricle: Left ventricular ejection fraction, by estimation, is 60 to 65%. The  left ventricle has normal function. The left ventricle has no regional wall motion abnormalities. Strain was performed and the global longitudinal strain is indeterminate. The left ventricular internal cavity size was small. There is moderate asymmetric left ventricular hypertrophy of the septal segment. Left ventricular diastolic parameters are consistent with Grade I diastolic dysfunction (impaired relaxation). Right Ventricle: The right ventricular size is normal. Mildly increased right ventricular wall thickness. Right ventricular systolic function is normal. Left Atrium: Left  atrial size was normal in size. Right Atrium: Right atrial size was normal in size. Pericardium: There is no evidence of pericardial effusion. Mitral Valve: The mitral valve is normal in structure. Trivial mitral valve regurgitation. MV peak gradient, 4.8 mmHg. The mean mitral valve gradient is 2.0 mmHg. Tricuspid Valve: The tricuspid valve is normal in structure. Tricuspid valve regurgitation is mild. Aortic Valve: The aortic valve is normal in structure. Aortic valve regurgitation is trivial. Aortic valve mean gradient measures 5.0 mmHg. Aortic valve peak gradient measures 8.5 mmHg. Aortic valve area, by VTI measures 5.01 cm. Pulmonic Valve: The pulmonic valve was normal in structure. Pulmonic valve regurgitation is not visualized. Aorta: The ascending aorta was not well visualized. IAS/Shunts: No atrial level shunt detected by color flow Doppler. Additional Comments: 3D was performed not requiring image post processing on an independent workstation and was indeterminate.  LEFT VENTRICLE PLAX 2D LVIDd:         4.20 cm   Diastology LVIDs:         2.80 cm   LV e' medial:    6.42 cm/s LV PW:         1.20 cm   LV E/e' medial:  8.8 LV IVS:        1.90 cm   LV e' lateral:   8.92 cm/s LVOT diam:     2.30 cm   LV E/e' lateral: 6.3 LV SV:         120 LV SV Index:   51 LVOT Area:     4.15 cm  RIGHT VENTRICLE RV Basal diam:  3.30 cm RV Mid diam:    2.60 cm LEFT ATRIUM             Index        RIGHT ATRIUM           Index LA diam:        4.10 cm 1.73 cm/m   RA Area:     10.70 cm LA Vol (A2C):   45.5 ml 19.24 ml/m  RA Volume:   19.10 ml  8.08 ml/m LA Vol (A4C):   27.7 ml 11.71 ml/m LA Biplane Vol: 38.4 ml 16.24 ml/m  AORTIC VALVE AV Area (Vmax):    4.10 cm AV Area (Vmean):   4.10 cm AV Area (VTI):     5.01 cm AV Vmax:           146.00 cm/s AV Vmean:          107.500 cm/s AV VTI:            0.240 m AV Peak Grad:      8.5 mmHg AV Mean Grad:      5.0 mmHg LVOT Vmax:         144.00 cm/s LVOT Vmean:        106.000 cm/s  LVOT VTI:          0.290 m LVOT/AV VTI ratio: 1.21  AORTA Ao Root diam: 3.60 cm MITRAL VALVE  TRICUSPID VALVE MV Area (PHT): 2.10 cm    TR Peak grad:   9.1 mmHg MV Area VTI:   4.56 cm    TR Vmax:        151.00 cm/s MV Peak grad:  4.8 mmHg MV Mean grad:  2.0 mmHg    SHUNTS MV Vmax:       1.09 m/s    Systemic VTI:  0.29 m MV Vmean:      59.8 cm/s   Systemic Diam: 2.30 cm MV Decel Time: 361 msec MV E velocity: 56.60 cm/s MV A velocity: 87.00 cm/s MV E/A ratio:  0.65 Dwayne D Callwood MD Electronically signed by Cara JONETTA Lovelace MD Signature Date/Time: 06/09/2024/1:47:37 PM    Final    US  ABDOMEN LIMITED WITH LIVER DOPPLER Result Date: 06/08/2024 CLINICAL DATA:  Abdominal pain, jaundice EXAM: DUPLEX ULTRASOUND OF LIVER TECHNIQUE: Color and duplex Doppler ultrasound was performed to evaluate the hepatic in-flow and out-flow vessels. COMPARISON:  06/08/2024, 02/17/2022 FINDINGS: Liver: Liver displays diffusely heterogeneous liver echotexture, with multiple hyperechoic masses throughout the liver parenchyma. Index mass within the anterior right lobe liver measures 2.5 x 2.3 x 2.4 cm. Given ultrasound and recent CT findings, further evaluation with dedicated liver CT or MRI is recommended. Liver is enlarged measuring 17.6 cm in the midclavicular line. The gallbladder is moderately distended, with 2.2 cm shadowing gallstone within the gallbladder neck. Gallbladder wall measures up to 4 mm in maximal thickness. Main Portal Vein size: 1.7 cm Portal Vein Velocities Main Prox:  11.9 cm/sec Main Mid: Occluded Main Dist: Occluded Right: Occluded Left: Occluded Hepatic Vein Velocities Right:  19.5 cm/sec Middle:  19.3 cm/sec Left:  24.7 cm/sec Normal directional flow, though waveforms are pulsatile and turbulent. IVC: Present and patent with normal respiratory phasicity. Hepatic Artery Velocity:  41.3 cm/sec Splenic Vein Velocity:  12.2 cm/sec Spleen: 16.5 cm x 8.5 cm x 17.8 cm with a total volume of 1306.9 cm^3  (411 cm^3 is upper limit normal) Portal Vein Occlusion/Thrombus: Yes, occlusion Splenic Vein Occlusion/Thrombus: No Ascites: None Varices: Large recanalized umbilical vein is noted, with echogenic thrombus near the liver. IMPRESSION: 1. Heterogeneous liver echotexture with multiple hyperechoic masses as above. Further evaluation with dedicated liver MRI is recommended when clinical situation permits. 2. Portal vein occlusion as above, with only the proximal aspect of the main portal vein demonstrating patency on Doppler evaluation. 3. Large recanalized umbilical vein, with echogenic thrombus near the liver. 4. Marked splenomegaly. Electronically Signed   By: Ozell Daring M.D.   On: 06/08/2024 16:08   CT Angio Chest/Abd/Pel for Dissection W and/or Wo Contrast Result Date: 06/08/2024 EXAM: CTA CHEST, ABDOMEN AND PELVIS WITH AND WITHOUT CONTRAST 06/08/2024 08:19:56 AM TECHNIQUE: CTA of the chest was performed with and without the administration of intravenous contrast. CTA of the abdomen and pelvis was performed with and without the administration of intravenous contrast. Multiplanar reformatted images are provided for review. MIP images are provided for review. Automated exposure control, iterative reconstruction, and/or weight based adjustment of the mA/kV was utilized to reduce the radiation dose to as low as reasonably achievable. COMPARISON: None available. CLINICAL HISTORY: Acute aortic syndrome (AAS) suspected. Patient ambulatory to triage with steady gait, without difficulty or distress noted; pt reports awakening at 3am with generalized abd pain accomp by N/V; denies hx of same; Hx of heart stents. FINDINGS: VASCULATURE: AORTA: There is mild fusiform aneurysmal dilatation of the ascending thoracic aorta, which measures up to 4.2 cm in AP diameter and 4.0  cm in transverse diameter. There is moderate calcific plaque present throughout the thoracic aorta. The descending thoracic aorta is tortuous. The  proximal descending thoracic aorta measures approximately 3.5 cm in diameter and the distal descending thoracic aorta approximately 3.1 cm in diameter. There is no evidence of acute aortic syndrome. PULMONARY ARTERIES: No pulmonary embolism with the limits of this exam. GREAT VESSELS OF AORTIC ARCH: No acute finding. No dissection. No arterial occlusion or significant stenosis. CELIAC TRUNK: No acute finding. No occlusion or significant stenosis. SUPERIOR MESENTERIC ARTERY: No acute finding. No occlusion or significant stenosis. The mesenteric arteries are widely patent. INFERIOR MESENTERIC ARTERY: No acute finding. No occlusion or significant stenosis. RENAL ARTERIES: There is mild stenosis of the origins of the renal arteries bilaterally, worse on the right, which is approximately 50% stenotic. 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 CHEST: MEDIASTINUM: The heart is mildly enlarged and there is moderate to severe calcific coronary artery disease. LUNGS AND PLEURA: There are ground-glass and reticular opacities present dependently within the lungs bilaterally. The nondependent lungs are clear. No evidence of pleural effusion or pneumothorax. THORACIC BONES AND SOFT TISSUES: No acute bone or soft tissue abnormality. ABDOMEN AND PELVIS: LIVER: There is heterogeneous enhancement of the liver and cirrhotic morphology. Hepatic lesions cannot be excluded given the heterogeneous attenuation. There is recanalization of the umbilical vein. GALLBLADDER AND BILE DUCTS: The gallbladder is moderately distended, measuring greater than 13 cm in length and greater than 5 cm in transverse dimension. There is a calcified stone layering dependently within the gallbladder. There is no definite evidence of cholecystitis. SPLEEN: There is moderate splenomegaly. The spleen measures approximately 16 cm in length. PANCREAS: The pancreas is  unremarkable. ADRENAL GLANDS: Bilateral adrenal glands demonstrate no acute abnormality. KIDNEYS, URETERS AND BLADDER: There are simple renal cysts present bilaterally, including an exophytic cyst on the right measuring nearly 8 cm in diameter. No follow up of the cysts is necessary. No stones in the kidneys or ureters. No hydronephrosis. No perinephric or periureteral stranding. Urinary bladder is unremarkable. GI AND BOWEL: There is a small sliding hiatus hernia. There are few sigmoid diverticula, but no evidence of diverticulitis. Stomach and duodenal sweep demonstrate no acute abnormality. There is no bowel obstruction. No abnormal bowel wall thickening or distension. REPRODUCTIVE: Reproductive organs are unremarkable. PERITONEUM AND RETROPERITONEUM: No ascites or free air. LYMPH NODES: No lymphadenopathy. ABDOMINAL BONES AND SOFT TISSUES: No acute abnormality of the bones. No acute soft tissue abnormality. IMPRESSION: 1. No evidence of acute aortic syndrome. 2. 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 3. Moderate calcific plaque throughout the thoracic aorta. 4. Mild stenosis of the origins of the renal arteries bilaterally, worse on the right (approximately 50% stenotic). 5. Heterogeneous enhancement of the liver with cirrhotic morphology. Hepatic lesions cannot be excluded. Follow up MRI of the liver without and with gadolinium contrast is suggested. 6. Moderate splenomegaly, measuring approximately 16 cm in length. 7. Small sliding hiatus hernia. Electronically signed by: evalene coho 06/08/2024 09:19 AM EDT RP Workstation: [id number]    Assessment and plan- Patient is a 73 y.o. male with prior history of cirrhosis admitted for acute cholecystitis found to have multifocal liver lesions  Acute cholecystitis: Patient has undergone cholecystectomy  today and is on IV antibiotics.  Management per primary  and surgical team.  Multifocal liver lesions: In the setting of cirrhosis this is concerning for multifocal HCC but I would still favor tissue diagnosis for confirmation.  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  When deemed safe from surgical standpoint patient should be started on therapeutic anticoagulation like Lovenox   I will have further conversation with the patient once tissue diagnosis is confirmed   Thank you for this kind referral and the opportunity to participate in the care of this  Patient   Visit Diagnosis 1. Chest pain with moderate risk for cardiac etiology     Dr. Annah Skene, MD, MPH [facility] [phone] 06/10/2024

## 2024-06-10 NOTE — Anesthesia Procedure Notes (Signed)
 Procedure Name: Intubation Date/Time: 06/10/2024 2:45 PM  Performed by: Landy Francena BIRCH, CRNAPre-anesthesia Checklist: Patient identified, Emergency Drugs available, Suction available and Patient being monitored Patient Re-evaluated:Patient Re-evaluated prior to induction Oxygen Delivery Method: Circle system utilized Preoxygenation: Pre-oxygenation with 100% oxygen Induction Type: IV induction Ventilation: Mask ventilation without difficulty and Oral airway inserted - appropriate to patient size Laryngoscope Size: McGrath and 3 Grade View: Grade I Tube type: Oral Tube size: 7.5 mm Number of attempts: 1 Airway Equipment and Method: Stylet, Oral airway and Bite block Placement Confirmation: ETT inserted through vocal cords under direct vision, positive ETCO2 and breath sounds checked- equal and bilateral Secured at: 23 cm Tube secured with: Tape Dental Injury: Teeth and Oropharynx as per pre-operative assessment

## 2024-06-10 NOTE — Plan of Care (Signed)

## 2024-06-10 NOTE — Plan of Care (Signed)

## 2024-06-10 NOTE — Interval H&P Note (Signed)
 History and Physical Interval Note:  06/10/2024 12:12 PM  Nicholas Abbott  has presented today for surgery, with the diagnosis of acute calculus cholecystitis.  The various methods of treatment have been discussed with the patient and family. After consideration of risks, benefits and other options for treatment, the patient has consented to  Procedure(s): CHOLECYSTECTOMY, ROBOT-ASSISTED, LAPAROSCOPIC (N/A) as a surgical intervention.  The patient's history has been reviewed, patient examined, no change in status, stable for surgery.  I have reviewed the patient's chart and labs.  Questions were answered to the patient's satisfaction.     [name]

## 2024-06-10 NOTE — Transfer of Care (Signed)
 Immediate Anesthesia Transfer of Care Note  Patient: Nicholas Abbott  Procedure(s) Performed: CHOLECYSTECTOMY, ROBOT-ASSISTED, LAPAROSCOPIC (Abdomen)  Patient Location: PACU  Anesthesia Type:General  Level of Consciousness: drowsy  Airway & Oxygen Therapy: Patient Spontanous Breathing and Patient connected to face mask oxygen  Post-op Assessment: Report given to RN, Post -op Vital signs reviewed and stable, and Patient moving all extremities  Post vital signs: Reviewed and stable  Last Vitals:  Vitals Value Taken Time  BP 88/60 06/10/24 17:38  Temp    Pulse 70 06/10/24 17:42  Resp 23 06/10/24 17:42  SpO2 98 % 06/10/24 17:42  Vitals shown include unfiled device data.  Last Pain:  Vitals:   06/10/24 1310  TempSrc:   PainSc: 5          Complications: No notable events documented.

## 2024-06-10 NOTE — Anesthesia Preprocedure Evaluation (Addendum)
 Anesthesia Evaluation  Patient identified by MRN, date of birth, ID band Patient awake    Reviewed: Allergy & Precautions, NPO status , Patient's Chart, lab work & pertinent test results  History of Anesthesia Complications Negative for: history of anesthetic complications  Airway Mallampati: II  TM Distance: >3 FB Neck ROM: full    Dental no notable dental hx.    Pulmonary sleep apnea    Pulmonary exam normal        Cardiovascular hypertension, + CAD, + Past MI and + Cardiac Stents  Normal cardiovascular exam  EKG: NSR, PVCs   Neuro/Psych negative neurological ROS  negative psych ROS   GI/Hepatic ,GERD  Medicated,,(+)     substance abuse  alcohol use  Endo/Other  diabetes, Type 2    Renal/GU ARFRenal disease     Musculoskeletal   Abdominal   Peds  Hematology negative hematology ROS (+)   Anesthesia Other Findings Past Medical History: No date: Basal cell carcinoma     Comment:  L ear, txted in past No date: Diabetes mellitus without complication (HCC) No date: GERD (gastroesophageal reflux disease) No date: Hyperlipidemia No date: Hypertension No date: Left shoulder pain No date: Myocardial infarction [facility])  Past Surgical History: No date: CATARACT EXTRACTION 01/01/2020: COLONOSCOPY WITH PROPOFOL ; N/A     Comment:  Procedure: COLONOSCOPY WITH PROPOFOL ;  Surgeon: [name],               [name], MD;  Location: [facility] ENDOSCOPY;  Service:               Gastroenterology;  Laterality: N/A; No date: CORONARY ANGIOPLASTY 01/01/2020: ESOPHAGOGASTRODUODENOSCOPY (EGD) WITH PROPOFOL ; N/A     Comment:  Procedure: ESOPHAGOGASTRODUODENOSCOPY (EGD) WITH               PROPOFOL ;  Surgeon: [name], [name], MD;  Location:               [facility] ENDOSCOPY;  Service: Gastroenterology;  Laterality:               N/A; No date: EYE SURGERY 04/29/2022: LEFT HEART CATH AND CORONARY ANGIOGRAPHY; N/A     Comment:  Procedure: LEFT  HEART CATH AND CORONARY ANGIOGRAPHY;                Surgeon: [name], MD;  Location: [facility]               INVASIVE CV LAB;  Service: Cardiovascular;  Laterality:               N/A; No date: RETINAL DETACHMENT SURGERY  BMI    Body Mass Index: 31.33 kg/m      Reproductive/Obstetrics negative OB ROS                              Anesthesia Physical Anesthesia Plan  ASA: 3  Anesthesia Plan: General ETT   Post-op Pain Management: Ofirmev  IV (intra-op)*, Toradol  IV (intra-op)* and Dilaudid  IV   Induction: Intravenous  PONV Risk Score and Plan: 2 and Ondansetron , Dexamethasone  and Treatment may vary due to age or medical condition  Airway Management Planned: Oral ETT  Additional Equipment:   Intra-op Plan:   Post-operative Plan: Extubation in OR  Informed Consent: I have reviewed the patients History and Physical, chart, labs and discussed the procedure including the risks, benefits and alternatives for the proposed anesthesia with the patient or authorized representative who has indicated his/her  understanding and acceptance.     Dental Advisory Given  Plan Discussed with: Anesthesiologist, CRNA and Surgeon  Anesthesia Plan Comments: (Patient consented for risks of anesthesia including but not limited to:  - adverse reactions to medications - damage to eyes, teeth, lips or other oral mucosa - nerve damage due to positioning  - sore throat or hoarseness - Damage to heart, brain, nerves, lungs, other parts of body or loss of life  Patient voiced understanding and assent.)         Anesthesia Quick Evaluation

## 2024-06-10 NOTE — Anesthesia Postprocedure Evaluation (Signed)
 Anesthesia Post Note  Patient: Miquel Maple Faden  Procedure(s) Performed: CHOLECYSTECTOMY, ROBOT-ASSISTED, LAPAROSCOPIC (Abdomen)  Patient location during evaluation: PACU Anesthesia Type: General Level of consciousness: awake and alert Pain management: pain level controlled Vital Signs Assessment: post-procedure vital signs reviewed and stable Respiratory status: spontaneous breathing, nonlabored ventilation, respiratory function stable and patient connected to nasal cannula oxygen Cardiovascular status: blood pressure returned to baseline and stable Postop Assessment: no apparent nausea or vomiting Anesthetic complications: no   No notable events documented.   Last Vitals:  Vitals:   06/10/24 1830 06/10/24 1850  BP: 102/72 119/75  Pulse: 69 69  Resp: 19 18  Temp:  36.8 C  SpO2: 94% 96%    Last Pain:  Vitals:   06/10/24 1850  TempSrc: Oral  PainSc:                  Lendia LITTIE Mae

## 2024-06-10 NOTE — Progress Notes (Signed)
 PROGRESS NOTE    Nicholas Abbott  FMW:[id number] DOB: February 02, 1951 DOA: 06/08/2024 PCP: Sherial Bail, MD    Brief Narrative:    73 y.o. male with medical history significant of multivessel CAD status post stenting, HTN, HLD, OSA on CPAP, liver cirrhosis, chronic thrombocytopenia, obesity, presented with new onset of abdominal pain and back pain.     Patient woke up this morning with 7-8/10 dull like abdominal pain and new onset of back pain, both pain has been constant, nonradiating, abdominal pain associated with feeling of nausea but no vomiting.  Patient had a normal bowel movement yesterday and she ate same home-cooked food with his wife yesterday evening and the wife has been okay.  Patient has a history of significant CAD MI many years ago and he described that when he had the MI, his main symptoms was back pain.  But he said also that today's back pain is different in nature compared to MRI associated back pain years ago.  Patient does have a history of cirrhosis with chronic transaminitis and bilirubinemia for which he has been following with PCP, he denied cirrhosis is related to alcohol drinking claiming that he drinks only occasionally and even after stopped drinking alcohol for 2 months his liver enzymes remains elevated.  He was recommended to see GI for further workup recently.  Liver MRI with multiple acute findings.  Needed verbal liver lesions concerning for [facility], pancreatic lesions concerning for IPMN, likely stone in gallbladder neck and radiographic findings of acute cholecystitis  General surgery and oncology consulted.  Recommendations appreciated.  General surgery to take for laparoscopic cholecystectomy on 8/9.  May attempt liver biopsy if lesions are readily accessible  Assessment & Plan:   Principal Problem:   Abdominal pain  Acute abdominal pain Back pain - No structural abnormality found to explain his symptoms, differential is wide, patient does have a chronic  cirrhosis, but image study does not show any significant ascites to implying acute pathology such as SBP, and clinically and image study was low suspicion for bowel obstruction.  Ultrasound with Doppler demonstrated some umbilical vein thrombosis but unclear whether this is related to his symptoms.  Back pain is also of unclear etiology.  Reported to be midthoracic without radiation.  Symptoms have improved. Plan: Symptoms have improved.  Possible that abdominal and back pain are result of stone impacted within the gallbladder neck.  Ensure pain control.  N.p.o. for now.  Surgery planning laparoscopic cholecystectomy today.  Multiple liver lesions Pancreatic lesions Noted on dedicated liver MRI.  Liver lesions concerning for HCC in the background of cirrhosis.  Pancreatic lesions suspicious for IPMN.  AFP elevated.  Patient will need tissue diagnosis.  General surgery to attempt liver biopsy if lesions are readily accessible.  If not will need interventional radiology for image guided percutaneous biopsy.  Oncology made aware.  Acute cholecystitis Sepsis secondary to above Sepsis physiology met with leukocytosis, fever.  Suspected source is infected stone within the gallbladder neck and resultant cholecystitis Plan: IVF Rocephin  Flagyl  Surgery consult Laparoscopic cholecystectomy planned 8/9  AKI Suspect prerenal azotemia.  Kidney function worsened since 8/7.  Meets criteria for AKI.  Will need intravenous fluids. Plan: Normal saline 75 cc/h x 24 hours Recheck kidney function in a.m.  Liver cirrhosis Chronic transaminitis Chronic bilirubinemia Chronic thrombocytopenia - Unknown etiology, alcoholic?,  NASH? Autoimune? - Appears to be stable, no significant impairment of synthetic function of liver isolation normal albumin level and INR within normal limits   CAD -  Continue aspirin  Plavix    HTN - Continue hydrochlorothiazide /lisinopril    Sinus bradycardia Frequent PVCs - Compared  to his recent office record, baseline heart rate in 40-60s and blood pressure stable.  As per recommendation from his cardiology at [facility], will continue low-dose of metoprolol    Alcohol abuse - Appears to have binge drinking habit but denied ever abusing large amount. - Currently there is no symptoms signs of alcohol withdrawal   DVT prophylaxis: SCD Code Status: Full Family Communication: Spouse at bedside 8/9 Disposition Plan: Status is: Inpatient Remains inpatient appropriate because: Multiple acute issues as above     Level of care: Med-Surg  Consultants:  None  Procedures:  None  Antimicrobials: Rocephin  Flagyl    Subjective: Seen and examined.  Resting in bed.  Wife at bedside.  Pain control improved  Objective: Vitals:   06/09/24 1511 06/09/24 2107 06/10/24 0456 06/10/24 0726  BP: (!) 114/54 114/63 108/69 112/65  Pulse: 76 88 66 71  Resp: 19 15 17 17   Temp: 98.9 F (37.2 C) 98.6 F (37 C) 98.2 F (36.8 C) 99.1 F (37.3 C)  TempSrc: Oral   Oral  SpO2: 95% 93% 92% 93%  Weight:      Height:        Intake/Output Summary (Last 24 hours) at 06/10/2024 1132 Last data filed at 06/09/2024 1500 Gross per 24 hour  Intake 442.21 ml  Output --  Net 442.21 ml   Filed Weights   06/08/24 0636  Weight: 110.7 kg    Examination:  General exam: NAD Respiratory system: Clear to auscultation. Respiratory effort normal. Cardiovascular system: S1-S2, RRR, no murmurs, no pedal edema Gastrointestinal system: Soft, nondistended, tender to palpation right upper quadrant, normal bowel sounds Central nervous system: Alert and oriented. No focal neurological deficits. Extremities: Symmetric 5 x 5 power. Skin: No rashes, lesions or ulcers Psychiatry: Judgement and insight appear normal. Mood & affect appropriate.     Data Reviewed: I have personally reviewed following labs and imaging studies  CBC: Recent Labs  Lab 06/08/24 0711 06/09/24 0323 06/10/24 0437  WBC  12.4* 25.7* 24.6*  NEUTROABS 4.0  --  11.4*  HGB 15.4 14.9 14.0  HCT 44.9 43.0 39.4  MCV 101.1* 100.7* 98.7  PLT 91* 83* 75*   Basic Metabolic Panel: Recent Labs  Lab 06/08/24 0711 06/08/24 0810 06/09/24 0323 06/10/24 0437  NA 138  --  135 133*  K 4.8  --  4.6 4.0  CL 104  --  105 105  CO2 21*  --  23 21*  GLUCOSE 152*  --  146* 148*  BUN 20  --  27* 36*  CREATININE 0.98 0.90 1.67* 1.28*  CALCIUM  9.5  --  9.0 8.5*  MG 1.9  --   --   --    GFR: Estimated Creatinine Clearance: 67 mL/min (A) (by C-G formula based on SCr of 1.28 mg/dL (H)). Liver Function Tests: Recent Labs  Lab 06/08/24 0711 06/09/24 0323  AST 119* 93*  ALT 95* 91*  ALKPHOS 92 86  BILITOT 2.0* 2.3*  PROT 6.3* 5.7*  ALBUMIN 3.0* 2.7*   Recent Labs  Lab 06/08/24 0711  LIPASE 48   No results for input(s): AMMONIA in the last 168 hours. Coagulation Profile: Recent Labs  Lab 06/08/24 1328  INR 1.1   Cardiac Enzymes: No results for input(s): CKTOTAL, CKMB, CKMBINDEX, TROPONINI in the last 168 hours. BNP (last 3 results) No results for input(s): PROBNP in the last 8760 hours. HbA1C: No results  for input(s): HGBA1C in the last 72 hours. CBG: No results for input(s): GLUCAP in the last 168 hours. Lipid Profile: No results for input(s): CHOL, HDL, LDLCALC, TRIG, CHOLHDL, LDLDIRECT in the last 72 hours. Thyroid Function Tests: Recent Labs    06/08/24 0711  TSH 5.763*   Anemia Panel: No results for input(s): VITAMINB12, FOLATE, FERRITIN, TIBC, IRON, RETICCTPCT in the last 72 hours. Sepsis Labs: Recent Labs  Lab 06/09/24 0323 06/10/24 0437  PROCALCITON 4.72 4.56    Recent Results (from the past 240 hours)  MRSA Next Gen by PCR, Nasal     Status: None   Collection Time: 06/09/24  1:09 PM   Specimen: Nasal Mucosa; Nasal Swab  Result Value Ref Range Status   MRSA by PCR Next Gen NOT DETECTED NOT DETECTED Final    Comment: (NOTE) The GeneXpert MRSA  Assay (FDA approved for NASAL specimens only), is one component of a comprehensive MRSA colonization surveillance program. It is not intended to diagnose MRSA infection nor to guide or monitor treatment for MRSA infections. Test performance is not FDA approved in patients less than 38 years old. Performed at [facility], [address] [place], [place] [address]   Culture, blood (Routine X 2) w Reflex to ID Panel     Status: None (Preliminary result)   Collection Time: 06/09/24  1:11 PM   Specimen: BLOOD LEFT ARM  Result Value Ref Range Status   Specimen Description BLOOD LEFT ARM  Final   Special Requests   Final    BOTTLES DRAWN AEROBIC AND ANAEROBIC Blood Culture adequate volume   Culture   Final    NO GROWTH < 24 HOURS Performed at [facility], [address] [place], [place] [address]    Report Status PENDING  Incomplete  Culture, blood (Routine X 2) w Reflex to ID Panel     Status: None (Preliminary result)   Collection Time: 06/09/24  1:18 PM   Specimen: BLOOD LEFT HAND  Result Value Ref Range Status   Specimen Description BLOOD LEFT HAND  Final   Special Requests   Final    BOTTLES DRAWN AEROBIC AND ANAEROBIC Blood Culture adequate volume   Culture   Final    NO GROWTH < 24 HOURS Performed at [facility], [address] [place], [place] [address]    Report Status PENDING  Incomplete         Radiology Studies: MR LIVER W WO CONTRAST Result Date: 06/09/2024 CLINICAL DATA:  Hypoechoic liver masses seen on US  and CT. EXAM: MRI ABDOMEN WITHOUT AND WITH CONTRAST TECHNIQUE: Multiplanar multisequence MR imaging of the abdomen was performed both before and after the administration of intravenous contrast. CONTRAST:  10mL GADAVIST  GADOBUTROL  1 MMOL/ML IV SOLN COMPARISON:  CT angiography chest, abdomen and pelvis from 06/08/2024. FINDINGS: Lower chest: There are linear areas of scarring/atelectasis in the visualized bilateral lungs. Otherwise  unremarkable MR appearance to the lung bases. No pleural effusion. No pericardial effusion. Normal heart size. Hepatobiliary: There is cirrhotic liver morphology. There are innumerable mildly T2 hyperintense lesions throughout the liver occupying more than 25% of the total liver parenchyma. The lesions exhibit arterial hyperenhancement and washout on delayed/equilibrium phase images. Findings favor multifocal HCC. The left portal vein appears expanded with loss of signal void on T2 weighted images. However, there is no measurable contrast enhancement on the postcontrast images. Findings therefore favor acute probable bland thrombus. However, attention on follow-up examination is recommended. There is dilated and recanalized  umbilical vein, suggesting sequela of portal hypertension. There is diminutive right portal vein however otherwise patent. The gallbladder is distended with width up to 5.9 cm. There is a stone in the gallbladder neck region. There is mild-to-moderate pericholecystic fat stranding and fluid, which is new since the prior study. In appropriate clinical setting, findings favor acute cholecystitis. No intra or extrahepatic bile duct dilation. No choledocholithiasis. Pancreas: There is a 7 x 10 mm T2 hyperintense nonenhancing structure in the pancreatic head. There are multiple additional subcentimeter sized T2 hyperintense nonenhancing structures in the uncinate process, head, neck and body region of the pancreas (marked with electronic arrow sign on series 3). There is no direct communication of these lesions with main pancreatic duct. 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 Spleen: Moderately enlarged spleen measuring 10.2 x 16.2 cm orthogonally on coronal plane. No focal lesion. Adrenals/Urinary Tract: Unremarkable adrenal glands. No hydroureteronephrosis. There are multiple simple cysts throughout bilateral kidneys with largest arising from the  right kidney laterally measuring up to 7.4 x 7.6 cm. Stomach/Bowel: There is a tiny sliding hiatal hernia. Visualized portions within the abdomen are unremarkable. No disproportionate dilation of bowel loops. Vascular/Lymphatic: No pathologically enlarged lymph nodes identified. No abdominal aortic aneurysm demonstrated. There is mild ascites mainly in the perihepatic and perisplenic region. No walled-off abscess seen. Other:  None. Musculoskeletal: No suspicious bone lesions identified. IMPRESSION: 1. Cirrhotic liver morphology with innumerable lesions throughout the liver occupying more than 25% of the total liver parenchyma. Findings favor multifocal HCC. There is acute probable bland thrombus in the left portal vein. However, attention on follow-up examination is recommended. 2. There is a 7 x 10 mm T2 hyperintense nonenhancing structure in the pancreatic head. There are multiple additional subcentimeter sized T2 hyperintense nonenhancing structures in the uncinate process, head, neck and body region of the pancreas. 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 3. There is a stone in the gallbladder neck region. There is new, mild-to-moderate pericholecystic fat stranding and fluid. Findings favor acute cholecystitis. 4. There is mild ascites mainly in the perihepatic and perisplenic region. No walled-off abscess seen. 5. Multiple other nonacute observations, as described above. Electronically Signed   By: Ree Molt M.D.   On: 06/09/2024 16:32   ECHOCARDIOGRAM COMPLETE Result Date: 06/09/2024    ECHOCARDIOGRAM REPORT   Patient Name:   KENNIS WISSMANN [facility] Date of Exam: 06/09/2024 Medical Rec #:  [id number]       Height:       74.0 in Accession #:    [id number]      Weight:       244.0 lb Date of Birth:  1950-11-08       BSA:          2.365 m Patient Age:    73 years        BP:           102/56 mmHg Patient Gender: M               HR:           84 bpm. Exam Location:   [facility] Procedure: 2D Echo, Cardiac Doppler and Color Doppler (Both Spectral and Color            Flow Doppler were utilized during procedure). Indications:     Chest pain R07.9  History:         Patient has no prior history of Echocardiogram examinations.  Previous Myocardial Infarction; Risk Factors:Diabetes and                  Hypertension.  Sonographer:     Christopher Furnace Referring Phys:  [id number] CORT ONEIDA MANA Diagnosing Phys: Cara JONETTA Lovelace MD IMPRESSIONS  1. Left ventricular ejection fraction, by estimation, is 60 to 65%. The left ventricle has normal function. The left ventricle has no regional wall motion abnormalities. There is moderate asymmetric left ventricular hypertrophy of the septal segment. Left ventricular diastolic parameters are consistent with Grade I diastolic dysfunction (impaired relaxation).  2. Right ventricular systolic function is normal. The right ventricular size is normal. Mildly increased right ventricular wall thickness.  3. The mitral valve is normal in structure. Trivial mitral valve regurgitation.  4. The aortic valve is normal in structure. Aortic valve regurgitation is trivial. FINDINGS  Left Ventricle: Left ventricular ejection fraction, by estimation, is 60 to 65%. The left ventricle has normal function. The left ventricle has no regional wall motion abnormalities. Strain was performed and the global longitudinal strain is indeterminate. The left ventricular internal cavity size was small. There is moderate asymmetric left ventricular hypertrophy of the septal segment. Left ventricular diastolic parameters are consistent with Grade I diastolic dysfunction (impaired relaxation). Right Ventricle: The right ventricular size is normal. Mildly increased right ventricular wall thickness. Right ventricular systolic function is normal. Left Atrium: Left atrial size was normal in size. Right Atrium: Right atrial size was normal in size. Pericardium: There is no evidence of  pericardial effusion. Mitral Valve: The mitral valve is normal in structure. Trivial mitral valve regurgitation. MV peak gradient, 4.8 mmHg. The mean mitral valve gradient is 2.0 mmHg. Tricuspid Valve: The tricuspid valve is normal in structure. Tricuspid valve regurgitation is mild. Aortic Valve: The aortic valve is normal in structure. Aortic valve regurgitation is trivial. Aortic valve mean gradient measures 5.0 mmHg. Aortic valve peak gradient measures 8.5 mmHg. Aortic valve area, by VTI measures 5.01 cm. Pulmonic Valve: The pulmonic valve was normal in structure. Pulmonic valve regurgitation is not visualized. Aorta: The ascending aorta was not well visualized. IAS/Shunts: No atrial level shunt detected by color flow Doppler. Additional Comments: 3D was performed not requiring image post processing on an independent workstation and was indeterminate.  LEFT VENTRICLE PLAX 2D LVIDd:         4.20 cm   Diastology LVIDs:         2.80 cm   LV e' medial:    6.42 cm/s LV PW:         1.20 cm   LV E/e' medial:  8.8 LV IVS:        1.90 cm   LV e' lateral:   8.92 cm/s LVOT diam:     2.30 cm   LV E/e' lateral: 6.3 LV SV:         120 LV SV Index:   51 LVOT Area:     4.15 cm  RIGHT VENTRICLE RV Basal diam:  3.30 cm RV Mid diam:    2.60 cm LEFT ATRIUM             Index        RIGHT ATRIUM           Index LA diam:        4.10 cm 1.73 cm/m   RA Area:     10.70 cm LA Vol (A2C):   45.5 ml 19.24 ml/m  RA Volume:   19.10 ml  8.08 ml/m  LA Vol (A4C):   27.7 ml 11.71 ml/m LA Biplane Vol: 38.4 ml 16.24 ml/m  AORTIC VALVE AV Area (Vmax):    4.10 cm AV Area (Vmean):   4.10 cm AV Area (VTI):     5.01 cm AV Vmax:           146.00 cm/s AV Vmean:          107.500 cm/s AV VTI:            0.240 m AV Peak Grad:      8.5 mmHg AV Mean Grad:      5.0 mmHg LVOT Vmax:         144.00 cm/s LVOT Vmean:        106.000 cm/s LVOT VTI:          0.290 m LVOT/AV VTI ratio: 1.21  AORTA Ao Root diam: 3.60 cm MITRAL VALVE               TRICUSPID VALVE  MV Area (PHT): 2.10 cm    TR Peak grad:   9.1 mmHg MV Area VTI:   4.56 cm    TR Vmax:        151.00 cm/s MV Peak grad:  4.8 mmHg MV Mean grad:  2.0 mmHg    SHUNTS MV Vmax:       1.09 m/s    Systemic VTI:  0.29 m MV Vmean:      59.8 cm/s   Systemic Diam: 2.30 cm MV Decel Time: 361 msec MV E velocity: 56.60 cm/s MV A velocity: 87.00 cm/s MV E/A ratio:  0.65 Dwayne D Callwood MD Electronically signed by Cara JONETTA Lovelace MD Signature Date/Time: 06/09/2024/1:47:37 PM    Final    US  ABDOMEN LIMITED WITH LIVER DOPPLER Result Date: 06/08/2024 CLINICAL DATA:  Abdominal pain, jaundice EXAM: DUPLEX ULTRASOUND OF LIVER TECHNIQUE: Color and duplex Doppler ultrasound was performed to evaluate the hepatic in-flow and out-flow vessels. COMPARISON:  06/08/2024, 02/17/2022 FINDINGS: Liver: Liver displays diffusely heterogeneous liver echotexture, with multiple hyperechoic masses throughout the liver parenchyma. Index mass within the anterior right lobe liver measures 2.5 x 2.3 x 2.4 cm. Given ultrasound and recent CT findings, further evaluation with dedicated liver CT or MRI is recommended. Liver is enlarged measuring 17.6 cm in the midclavicular line. The gallbladder is moderately distended, with 2.2 cm shadowing gallstone within the gallbladder neck. Gallbladder wall measures up to 4 mm in maximal thickness. Main Portal Vein size: 1.7 cm Portal Vein Velocities Main Prox:  11.9 cm/sec Main Mid: Occluded Main Dist: Occluded Right: Occluded Left: Occluded Hepatic Vein Velocities Right:  19.5 cm/sec Middle:  19.3 cm/sec Left:  24.7 cm/sec Normal directional flow, though waveforms are pulsatile and turbulent. IVC: Present and patent with normal respiratory phasicity. Hepatic Artery Velocity:  41.3 cm/sec Splenic Vein Velocity:  12.2 cm/sec Spleen: 16.5 cm x 8.5 cm x 17.8 cm with a total volume of 1306.9 cm^3 (411 cm^3 is upper limit normal) Portal Vein Occlusion/Thrombus: Yes, occlusion Splenic Vein Occlusion/Thrombus: No Ascites:  None Varices: Large recanalized umbilical vein is noted, with echogenic thrombus near the liver. IMPRESSION: 1. Heterogeneous liver echotexture with multiple hyperechoic masses as above. Further evaluation with dedicated liver MRI is recommended when clinical situation permits. 2. Portal vein occlusion as above, with only the proximal aspect of the main portal vein demonstrating patency on Doppler evaluation. 3. Large recanalized umbilical vein, with echogenic thrombus near the liver. 4. Marked splenomegaly. Electronically Signed   By: Ozell Daring  M.D.   On: 06/08/2024 16:08        Scheduled Meds:  empagliflozin   10 mg Oral Q breakfast   levothyroxine   112 mcg Oral QAC breakfast   lidocaine   2 patch Transdermal Q24H   liothyronine   5 mcg Oral q morning   metoprolol  succinate  12.5 mg Oral Daily   Continuous Infusions:  sodium chloride  75 mL/hr at 06/10/24 1020   cefTRIAXone  (ROCEPHIN )  IV 2 g (06/09/24 1303)   metronidazole  500 mg (06/10/24 0129)     LOS: 1 day    Calvin KATHEE Robson, MD [facility]   If 7PM-7AM, please contact night-coverage  06/10/2024, 11:32 AM

## 2024-06-10 NOTE — Op Note (Signed)
 Robotic cholecystectomy with Indocyamine Green Ductal Imaging.   Pre-operative Diagnosis: Acute calculus cholecystitis  Post-operative Diagnosis:  Same, with hydrops.  Procedure: Robotic assisted laparoscopic cholecystectomy with Indocyamine Green Ductal Imaging.   Surgeon: [name], M.D., FACS  Anesthesia: General. with endotracheal tube  Findings: Highly vascularized/omental encasement of gallbladder.  Large umbilical vein.  Extensive collateralization of soft tissue vessels to gallbladder itself and surrounding soft tissues with increasing frequency as we progressed toward the neck of the gallbladder.  Estimated Blood Loss: 60 mL         Drains: None         Specimens: Gallbladder           Complications: none  Procedure Details  The patient was seen again in the Holding Room.  1.25 mg dose of ICG was administered intravenously.   The benefits, complications, treatment options, risks and expected outcomes were again reviewed with the patient. The likelihood of improving the patient's symptoms with return to their baseline status is good.  The patient and/or family concurred with the proposed plan, giving informed consent, again alternatives reviewed.  The patient was taken to Operating Room, identified, and the procedure verified as robotic assisted laparoscopic cholecystectomy.  Prior to the induction of general anesthesia, antibiotic prophylaxis was administered. VTE prophylaxis was in place. General endotracheal anesthesia was then administered and tolerated well. The patient was positioned in the supine position.  After the induction, the abdomen was prepped with Chloraprep and draped in the sterile fashion.  A Time Out was held and the above information confirmed.  After local infiltration of quarter percent Marcaine  with epinephrine , stab incision was made left upper quadrant.  Just below the costal margin at Palmer's point, approximately midclavicular line the Veres  needle is passed with sensation of the layers to penetrate the abdominal wall and into the peritoneum.  Saline drop test is confirmed peritoneal placement.  Insufflation is initiated with carbon dioxide to pressures of 15 mmHg.  Local infiltration with 0.25% Marcaine  with epinephrine  is utilized for all skin incisions.  Made a 12 mm incision on the right periumbilical site, more laterally than usual, anticipating the engorged umbilical vein.  I advanced an optical 11mm port under direct visualization into the peritoneal cavity.  Once the peritoneum was penetrated, insufflation was initiated.  The trocar was then advanced into the abdominal cavity under direct visualization. Pneumoperitoneum was then continued utilizing CO2 at 15 mmHg or less and tolerated well without any adverse changes in the patient's vital signs.  Two 8.5-mm ports were placed in the left lower quadrant and laterally, and one to the right lower quadrant, all under direct vision. Local infiltration with 0.25% Marcaine  with epinephrine  is utilized for all port sites with deep infiltration under visualization.   The patient was positioned  in reverse Trendelenburg, tilted the patient's left side down.  Da Vinci XI robot was then positioned on to the patient's left side, and docked.  The gallbladder was identified, the fundus wrapped with omentum, I obtained a 30 degree scope to better visualize the process due to a lack of mobility of the right cirrhotic liver lobe, and its distention.  I intentionally made a drain opening through the gallbladder wall and utilizing the robotic suction irrigator aspirated its contents.  Grasped the fundus via the arm 4 Prograsp and retracted cephalad. Adhesions were lysed with blunt dissection and cautery.  Gradually increasing frequency of bloody oozing and small dilated vessels were identified as we progressed toward the infundibulum.  Considering the severity of this gentlemen's cirrhosis, I felt it prudent  not to provide a definitive cholecystectomy, but to plate safe and proceed with a subtotal cholecystectomy with reconstitution.  I proceeded with dividing into the gallbladder at the proximalmost extent of the body prior to approaching the neck.  This was completed cautiously with bipolar cautery and cauterizing scissors to create an anterior opening and subsequently followed this up without attempting to dissected off the back wall of the gallbladder from its fossa.  This was done meticulously to maintain hemostasis.  Having completely opened the gallbladder I was able to look into the proximal extent of the gallbladder and aspirated the contents, and identified the offending stone.  This was removed from the gallbladder.  But due to the extensive scarring and granulation tissue present along with exudative debris.  I felt it prudent again not to do further dissection of this region.  I proceeded to remove the gallbladder by working my way toward the fundus continuing to utilize bipolar and monopolar scissors.  Once the remaining gallbladder was removed I placed the stone in it and set it aside for further extraction. I then proceeded with closure of the gallbladder by utilizing a 3-0 STRATAFIX PDS.  I closed the residual gallbladder with a 2 layered closure.  This would act as my cystic duct control. The gallbladder was removed and placed in an Endocatch bag.  The liver bed is inspected. Hemostasis was confirmed.  Any residual posterior wall gallbladder lining was fulgurated with monopolar electrocautery.  A 19 Blake drain was placed via the right lower quadrant trocar site and secured to the skin.  I placed it in the gallbladder fossa at its lowest extent posteriorly. The robot was undocked and moved away from the operative field. 2 to 3 L of normal saline irrigation was utilized and was aspirated clear.  The gallbladder and Endocatch sac were then removed, I was concerned about extracting it through the  site adjacent to the umbilical vein, I transferred it to the left medial/rectus site where this incision was dilated for extraction.   Inspection of the right upper quadrant was performed. No bleeding, bile duct injury or leak, or bowel injury was noted. The extraction port site fascia was closed with interrumpted 0 Vicryl sutures using PMI/cone under direct visualization. Pneumoperitoneum was released and ports removed.  We had a little bit of oozing from the periumbilical site, but no identifiable source of bleeding was noted.  We were quite cautious and remained extremely gentle with this large umbilical vein present.  4-0 subcuticular Monocryl was used to close the skin.  The drain was secured at the skin with 3-0 nylon.  Dermabond was  applied to the closed incisions.  Drain dressing applied. The patient was then extubated and brought to the recovery room in stable condition. Sponge, lap, and needle counts were correct at closure and at the conclusion of the case.               [name], M.D., [facility] 06/10/2024 6:22 PM

## 2024-06-11 ENCOUNTER — Inpatient Hospital Stay

## 2024-06-11 DIAGNOSIS — R1011 Right upper quadrant pain: Secondary | ICD-10-CM | POA: Diagnosis not present

## 2024-06-11 LAB — CBC WITH DIFFERENTIAL/PLATELET
Abs Immature Granulocytes: 0.24 K/uL — ABNORMAL HIGH (ref 0.00–0.07)
Basophils Absolute: 0.1 K/uL (ref 0.0–0.1)
Basophils Relative: 0 %
Eosinophils Absolute: 0.3 K/uL (ref 0.0–0.5)
Eosinophils Relative: 1 %
HCT: 40.2 % (ref 39.0–52.0)
Hemoglobin: 13.8 g/dL (ref 13.0–17.0)
Immature Granulocytes: 1 %
Lymphocytes Relative: 45 %
Lymphs Abs: 10.9 K/uL — ABNORMAL HIGH (ref 0.7–4.0)
MCH: 34.4 pg — ABNORMAL HIGH (ref 26.0–34.0)
MCHC: 34.3 g/dL (ref 30.0–36.0)
MCV: 100.2 fL — ABNORMAL HIGH (ref 80.0–100.0)
Monocytes Absolute: 1.7 K/uL — ABNORMAL HIGH (ref 0.1–1.0)
Monocytes Relative: 7 %
Neutro Abs: 11 K/uL — ABNORMAL HIGH (ref 1.7–7.7)
Neutrophils Relative %: 46 %
Platelets: 106 K/uL — ABNORMAL LOW (ref 150–400)
RBC: 4.01 MIL/uL — ABNORMAL LOW (ref 4.22–5.81)
RDW: 15.9 % — ABNORMAL HIGH (ref 11.5–15.5)
Smear Review: NORMAL
WBC Morphology: REACTIVE
WBC: 24.1 K/uL — ABNORMAL HIGH (ref 4.0–10.5)
nRBC: 0 % (ref 0.0–0.2)

## 2024-06-11 LAB — CBC
HCT: 38.6 % — ABNORMAL LOW (ref 39.0–52.0)
Hemoglobin: 13.3 g/dL (ref 13.0–17.0)
MCH: 34.5 pg — ABNORMAL HIGH (ref 26.0–34.0)
MCHC: 34.5 g/dL (ref 30.0–36.0)
MCV: 100 fL (ref 80.0–100.0)
Platelets: 88 K/uL — ABNORMAL LOW (ref 150–400)
RBC: 3.86 MIL/uL — ABNORMAL LOW (ref 4.22–5.81)
RDW: 15.9 % — ABNORMAL HIGH (ref 11.5–15.5)
WBC: 21.1 K/uL — ABNORMAL HIGH (ref 4.0–10.5)
nRBC: 0 % (ref 0.0–0.2)

## 2024-06-11 LAB — HEMOGLOBIN AND HEMATOCRIT, BLOOD
HCT: 42.4 % (ref 39.0–52.0)
HCT: 41.7 % (ref 39.0–52.0)
Hemoglobin: 14.6 g/dL (ref 13.0–17.0)
Hemoglobin: 14.4 g/dL (ref 13.0–17.0)

## 2024-06-11 LAB — PROTIME-INR
INR: 1.3 — ABNORMAL HIGH (ref 0.8–1.2)
Prothrombin Time: 16.5 s — ABNORMAL HIGH (ref 11.4–15.2)

## 2024-06-11 LAB — COMPREHENSIVE METABOLIC PANEL WITH GFR
ALT: 71 U/L — ABNORMAL HIGH (ref 0–44)
AST: 70 U/L — ABNORMAL HIGH (ref 15–41)
Albumin: 2.1 g/dL — ABNORMAL LOW (ref 3.5–5.0)
Alkaline Phosphatase: 68 U/L (ref 38–126)
Anion gap: 6 (ref 5–15)
BUN: 41 mg/dL — ABNORMAL HIGH (ref 8–23)
CO2: 21 mmol/L — ABNORMAL LOW (ref 22–32)
Calcium: 7.6 mg/dL — ABNORMAL LOW (ref 8.9–10.3)
Chloride: 104 mmol/L (ref 98–111)
Creatinine, Ser: 1.24 mg/dL (ref 0.61–1.24)
GFR, Estimated: >60 mL/min (ref >60)
Glucose, Bld: 230 mg/dL — ABNORMAL HIGH (ref 70–99)
Potassium: 4.4 mmol/L (ref 3.5–5.1)
Sodium: 131 mmol/L — ABNORMAL LOW (ref 135–145)
Total Bilirubin: 1.1 mg/dL (ref 0.0–1.2)
Total Protein: 5.2 g/dL — ABNORMAL LOW (ref 6.5–8.1)

## 2024-06-11 LAB — HEMOGLOBIN: Hemoglobin: 13.6 g/dL (ref 13.0–17.0)

## 2024-06-11 LAB — GLUCOSE, CAPILLARY: Glucose-Capillary: 188 mg/dL — ABNORMAL HIGH (ref 70–99)

## 2024-06-11 LAB — ABO/RH
ABO/RH(D): A POS
ABO/RH(D): A POS

## 2024-06-11 MED ORDER — IOHEXOL 350 MG/ML SOLN
100.0000 mL | Freq: Once | INTRAVENOUS | Status: AC | PRN
  Administered 2024-06-11: 100 mL via INTRAVENOUS

## 2024-06-11 MED ORDER — CHLORHEXIDINE GLUCONATE CLOTH 2 % EX PADS
6.0000 | MEDICATED_PAD | Freq: Every day | CUTANEOUS | Status: DC
  Administered 2024-06-12 – 2024-06-21 (×11): 6 via TOPICAL

## 2024-06-11 NOTE — Progress Notes (Signed)
 Notified by lab ,order processing for platelets.

## 2024-06-11 NOTE — Progress Notes (Signed)
 Spoke with Dr. Lane to consent pt for Plt tranfusion, Dr. Derrill stated that this was not urgent and could wait until the am for the transfusion. Notified Sashonie the primary nurse. Plts will not be ready for another 2 hours, will let Provider consent patient for Plts.Pts stable at this time. Plt-75 at this time.

## 2024-06-11 NOTE — Plan of Care (Signed)

## 2024-06-11 NOTE — Progress Notes (Signed)
 [facility] SURGICAL PROGRESS NOTE  Hospital Day(s): 2.   Post op day(s): 1 Day Post-Op.   Interval History: Patient seen and examined, no acute events or new complaints overnight.  However, patient has had consistent high outputs of serosanguineous fluid from the Jackson-Pratt left in the region of the gallbladder fossa.  Has become more concentrated in appearance, and this morning after platelet transfusion has become darker more consistent with old blood.  Patient reports tolerance of clear liquids, reasonable pain control.  No history of lightheadedness, in no and when sitting up had a larger volume of drain output.  Review of Systems:  Constitutional: denies fever, chills  Respiratory: denies any shortness of breath  Cardiovascular: denies chest pain or palpitations  Gastrointestinal: denies N/V, or diarrhea/and bowel function as per interval history Musculoskeletal: denies pain, decreased motor or sensation Integumentary: denies any other rashes or skin discolorations  Vital signs in last 24 hours: [min-max] current  Temp:  [97.4 F (36.3 C)-98.9 F (37.2 C)] 97.7 F (36.5 C) (08/10 0949) Pulse Rate:  [56-73] 67 (08/10 0949) Resp:  [16-28] 20 (08/10 0914) BP: (88-123)/(60-75) 123/70 (08/10 0949) SpO2:  [91 %-100 %] 94 % (08/10 0949) FiO2 (%):  [21 %] 21 % (08/09 2038)     Height: 6' 2 (188 cm) Weight: 110.7 kg BMI (Calculated): 31.31   Intake/Output last 2 shifts:  08/09 0701 - 08/10 0700 In: 2845.5 [P.O.:240; I.V.:2305.5; IV Piggyback:300] Out: 1820 [Urine:600; Drains:1050; Blood:60]   Physical Exam:  Constitutional: alert, cooperative and no distress  Respiratory: breathing non-labored at rest  Cardiovascular: regular rate and rhythm  Gastrointestinal: soft, non-tender, and non-distended Integumentary: Skin incisions are clean dry and intact.  Right lower quadrant JP drain with a full bulb consistent with venous appearing blood.  Labs:     Latest  Ref Rng & Units 06/11/2024    9:00 AM 06/11/2024    4:40 AM 06/11/2024   12:22 AM  CBC  WBC 4.0 - 10.5 K/uL 24.1  21.1    Hemoglobin 13.0 - 17.0 g/dL 86.1  86.6  85.3   Hematocrit 39.0 - 52.0 % 40.2  38.6  42.4   Platelets 150 - 400 K/uL 106  88        Latest Ref Rng & Units 06/11/2024    4:40 AM 06/10/2024    4:37 AM 06/09/2024    3:23 AM  CMP  Glucose 70 - 99 mg/dL 769  851  853   BUN 8 - 23 mg/dL 41  36  27   Creatinine 0.61 - 1.24 mg/dL 8.75  8.71  8.32   Sodium 135 - 145 mmol/L 131  133  135   Potassium 3.5 - 5.1 mmol/L 4.4  4.0  4.6   Chloride 98 - 111 mmol/L 104  105  105   CO2 22 - 32 mmol/L 21  21  23    Calcium  8.9 - 10.3 mg/dL 7.6  8.5  9.0   Total Protein 6.5 - 8.1 g/dL 5.2   5.7   Total Bilirubin 0.0 - 1.2 mg/dL 1.1   2.3   Alkaline Phos 38 - 126 U/L 68   86   AST 15 - 41 U/L 70   93   ALT 0 - 44 U/L 71   91      Imaging studies: No new pertinent imaging studies   Assessment/Plan:  73 y.o. male with  1 Day Post-Op s/p robotic subtotal cholecystectomy with reconstitution for acute calculus cholecystitis, complicated  by pertinent comorbidities including:   Patient Active Problem List   Diagnosis Date Noted   Calculus of gallbladder with acute cholecystitis without obstruction 06/10/2024   Cirrhosis of liver with ascites (HCC) 06/10/2024   Liver masses 06/10/2024   Abdominal pain 06/08/2024   Closed nondisplaced fracture of fifth cervical vertebra with routine healing 10/25/2023   Pain in joint of right shoulder 10/25/2023   OSA on CPAP 08/30/2023   CPAP use counseling 08/30/2023   S/P angioplasty with stent 05/01/2022   GERD (gastroesophageal reflux disease) 04/28/2021   Sleep apnea 04/28/2021   Hyperlipidemia 04/28/2021   Hyperlipidemia    Hypertension    Diabetes mellitus without complication (HCC)    CAD (coronary artery disease)    Thrombocytopenia (HCC)    Acute metabolic encephalopathy    Hypoglycemia    Type 2 diabetes mellitus without complication,  without long-term current use of insulin  (HCC) 02/11/2017   Combined arterial insufficiency and corporo-venous occlusive erectile dysfunction 02/10/2017   Coronary artery disease 06/06/2014   Hypertension 06/06/2014   Obesity 05/30/2012   Myocardial infarction (HCC) 03/02/2000    - Will proceed with CTA with and without of abdomen and pelvis to evaluate for both arterial and venous phase attempts to localize source of bleeding.  Suspect may be collaterals/tributaries from caudal aspect of recanalized umbilical vein.  But may be from other source considering the portal venous hypertension, and perihepatic collaterals.  - Serial H&H, continue to keep up with recharging bulb for Blake drain.  - Transfer to stepdown unit for closer observation and maintenance of drain.  - Discussed with patient and his wife at length, alternative options which may require transfer to tertiary care center for possible TIPS intervention or other interventional radiological assistance.  - Despite some acute thrombus in left portal vein, would not entertain anticoagulation at this point.  - Agree with deferring percutaneous liver biopsy at this time.  All of the above findings and recommendations were discussed with the patient, and all of patient's questions were answered to their expressed satisfaction.  -- [name], M.D., [facility] 06/11/2024

## 2024-06-11 NOTE — Plan of Care (Signed)

## 2024-06-11 NOTE — Progress Notes (Signed)
 PROGRESS NOTE    Nicholas Abbott  FMW:[id number] DOB: [birth year] DOA: 06/08/2024 PCP: Sherial Bail, MD    Brief Narrative:    73 y.o. male with medical history significant of multivessel CAD status post stenting, HTN, HLD, OSA on CPAP, liver cirrhosis, chronic thrombocytopenia, obesity, presented with new onset of abdominal pain and back pain.     Patient woke up this morning with 7-8/10 dull like abdominal pain and new onset of back pain, both pain has been constant, nonradiating, abdominal pain associated with feeling of nausea but no vomiting.  Patient had a normal bowel movement yesterday and she ate same home-cooked food with his wife yesterday evening and the wife has been okay.  Patient has a history of significant CAD MI many years ago and he described that when he had the MI, his main symptoms was back pain.  But he said also that today's back pain is different in nature compared to MRI associated back pain years ago.  Patient does have a history of cirrhosis with chronic transaminitis and bilirubinemia for which he has been following with PCP, he denied cirrhosis is related to alcohol drinking claiming that he drinks only occasionally and even after stopped drinking alcohol for 2 months his liver enzymes remains elevated.  He was recommended to see GI for further workup recently.  Liver MRI with multiple acute findings.  Needed verbal liver lesions concerning for [facility], pancreatic lesions concerning for IPMN, likely stone in gallbladder neck and radiographic findings of acute cholecystitis  General surgery and oncology consulted.  Recommendations appreciated.  General surgery to take for laparoscopic cholecystectomy on 8/9.  May attempt liver biopsy if lesions are readily accessible  Assessment & Plan:   Principal Problem:   Abdominal pain Active Problems:   Calculus of gallbladder with acute cholecystitis without obstruction   Cirrhosis of liver with ascites (HCC)   Liver  masses  Acute abdominal pain Back pain - No structural abnormality found to explain his symptoms, differential is wide, patient does have a chronic cirrhosis, but image study does not show any significant ascites to implying acute pathology such as SBP, and clinically and image study was low suspicion for bowel obstruction.  Ultrasound with Doppler demonstrated some umbilical vein thrombosis but unclear whether this is related to his symptoms.  Back pain is also of unclear etiology.  Reported to be midthoracic without radiation.  Symptoms have improved. Plan: Pain symptoms have improved  Multiple liver lesions Pancreatic lesions Noted on dedicated liver MRI.  Liver lesions concerning for HCC in the background of cirrhosis.  Pancreatic lesions suspicious for IPMN.  AFP elevated.  Patient will need tissue diagnosis.  General surgery was not able to safely perform intraoperative liver biopsy.  Will need interventional radiology consult.  Oncology to follow-up.  This is appreciated.  Acute cholecystitis Sepsis secondary to above Sepsis physiology met with leukocytosis, fever.  Suspected source is infected stone within the gallbladder neck and resultant cholecystitis.  Status post laparoscopic cholecystectomy on 8/9.  Tolerated procedure well.  JP drain left in place. Plan:  Continue IVF.  Continue empiric antibiotics.  Surgical follow-up. On clear liquid diet.  Defer to surgery regarding diet advancement   AKI Suspect prerenal azotemia.  Kidney function worsened since 8/7.  Meets criteria for AKI.  Will need intravenous fluids.  Kidney function near normal on 8/10 Plan: New IVF for now  Liver cirrhosis Chronic transaminitis Chronic bilirubinemia Chronic thrombocytopenia - Unknown etiology, alcoholic?,  NASH? Autoimune? - Appears to  be stable, no significant impairment of synthetic function of liver isolation normal albumin level and INR within normal limits   CAD - Continue aspirin   Plavix    HTN - Continue hydrochlorothiazide /lisinopril    Sinus bradycardia Frequent PVCs - Compared to his recent office record, baseline heart rate in 40-60s and blood pressure stable.  As per recommendation from his cardiology at [facility], will continue low-dose of metoprolol    Alcohol abuse - Appears to have binge drinking habit but denied ever abusing large amount. - Currently there is no symptoms signs of alcohol withdrawal   DVT prophylaxis: SCD Code Status: Full Family Communication: Spouse at bedside 8/9 Disposition Plan: Status is: Inpatient Remains inpatient appropriate because: Multiple acute issues as above     Level of care: Med-Surg  Consultants:  None  Procedures:  None  Antimicrobials: Rocephin  Flagyl    Subjective: Seen and examined.  Resting in bed.  Appears in no visible distress.  Objective: Vitals:   06/11/24 0914 06/11/24 0949 06/11/24 1120 06/11/24 1137  BP: 121/69 123/70 124/67 114/72  Pulse: 62 67 69 64  Resp: 20  16 18   Temp: 98.9 F (37.2 C) 97.7 F (36.5 C) 98.5 F (36.9 C) 98.8 F (37.1 C)  TempSrc: Oral Oral Oral Oral  SpO2: 92% 94% 93% 94%  Weight:      Height:        Intake/Output Summary (Last 24 hours) at 06/11/2024 1147 Last data filed at 06/11/2024 1142 Gross per 24 hour  Intake 5085.53 ml  Output 2125 ml  Net 2960.53 ml   Filed Weights   06/08/24 0636  Weight: 110.7 kg    Examination:  General exam: No acute distress Respiratory system: Clear to auscultation. Respiratory effort normal. Cardiovascular system: S1-S2, RRR, no murmurs, no pedal edema Gastrointestinal system: Soft, nondistended, RUQ tender, JP drain in place Central nervous system: Alert and oriented. No focal neurological deficits. Extremities: Symmetric 5 x 5 power. Skin: No rashes, lesions or ulcers Psychiatry: Judgement and insight appear normal. Mood & affect appropriate.     Data Reviewed: I have personally reviewed following labs and  imaging studies  CBC: Recent Labs  Lab 06/08/24 0711 06/09/24 0323 06/10/24 0437 06/10/24 1858 06/11/24 0022 06/11/24 0440 06/11/24 0900  WBC 12.4* 25.7* 24.6*  --   --  21.1* 24.1*  NEUTROABS 4.0  --  11.4*  --   --   --  11.0*  HGB 15.4 14.9 14.0 15.3 14.6 13.3 13.8  HCT 44.9 43.0 39.4 44.7 42.4 38.6* 40.2  MCV 101.1* 100.7* 98.7  --   --  100.0 100.2*  PLT 91* 83* 75*  --   --  88* 106*   Basic Metabolic Panel: Recent Labs  Lab 06/08/24 0711 06/08/24 0810 06/09/24 0323 06/10/24 0437 06/11/24 0440  NA 138  --  135 133* 131*  K 4.8  --  4.6 4.0 4.4  CL 104  --  105 105 104  CO2 21*  --  23 21* 21*  GLUCOSE 152*  --  146* 148* 230*  BUN 20  --  27* 36* 41*  CREATININE 0.98 0.90 1.67* 1.28* 1.24  CALCIUM  9.5  --  9.0 8.5* 7.6*  MG 1.9  --   --   --   --    GFR: Estimated Creatinine Clearance: 69.2 mL/min (by C-G formula based on SCr of 1.24 mg/dL). Liver Function Tests: Recent Labs  Lab 06/08/24 0711 06/09/24 0323 06/11/24 0440  AST 119* 93* 70*  ALT 95* 91*  71*  ALKPHOS 92 86 68  BILITOT 2.0* 2.3* 1.1  PROT 6.3* 5.7* 5.2*  ALBUMIN 3.0* 2.7* 2.1*   Recent Labs  Lab 06/08/24 0711  LIPASE 48   No results for input(s): AMMONIA in the last 168 hours. Coagulation Profile: Recent Labs  Lab 06/08/24 1328  INR 1.1   Cardiac Enzymes: No results for input(s): CKTOTAL, CKMB, CKMBINDEX, TROPONINI in the last 168 hours. BNP (last 3 results) No results for input(s): PROBNP in the last 8760 hours. HbA1C: No results for input(s): HGBA1C in the last 72 hours. CBG: Recent Labs  Lab 06/10/24 1742  GLUCAP 138*   Lipid Profile: No results for input(s): CHOL, HDL, LDLCALC, TRIG, CHOLHDL, LDLDIRECT in the last 72 hours. Thyroid Function Tests: No results for input(s): TSH, T4TOTAL, FREET4, T3FREE, THYROIDAB in the last 72 hours.  Anemia Panel: No results for input(s): VITAMINB12, FOLATE, FERRITIN, TIBC, IRON,  RETICCTPCT in the last 72 hours. Sepsis Labs: Recent Labs  Lab 06/09/24 0323 06/10/24 0437  PROCALCITON 4.72 4.56    Recent Results (from the past 240 hours)  MRSA Next Gen by PCR, Nasal     Status: None   Collection Time: 06/09/24  1:09 PM   Specimen: Nasal Mucosa; Nasal Swab  Result Value Ref Range Status   MRSA by PCR Next Gen NOT DETECTED NOT DETECTED Final    Comment: (NOTE) The GeneXpert MRSA Assay (FDA approved for NASAL specimens only), is one component of a comprehensive MRSA colonization surveillance program. It is not intended to diagnose MRSA infection nor to guide or monitor treatment for MRSA infections. Test performance is not FDA approved in patients less than 71 years old. Performed at [facility], [address] [place], [place] [address]   Culture, blood (Routine X 2) w Reflex to ID Panel     Status: None (Preliminary result)   Collection Time: 06/09/24  1:11 PM   Specimen: BLOOD LEFT ARM  Result Value Ref Range Status   Specimen Description BLOOD LEFT ARM  Final   Special Requests   Final    BOTTLES DRAWN AEROBIC AND ANAEROBIC Blood Culture adequate volume   Culture   Final    NO GROWTH 2 DAYS Performed at [facility], [address] [place], [place] [address]    Report Status PENDING  Incomplete  Culture, blood (Routine X 2) w Reflex to ID Panel     Status: None (Preliminary result)   Collection Time: 06/09/24  1:18 PM   Specimen: BLOOD LEFT HAND  Result Value Ref Range Status   Specimen Description BLOOD LEFT HAND  Final   Special Requests   Final    BOTTLES DRAWN AEROBIC AND ANAEROBIC Blood Culture adequate volume   Culture   Final    NO GROWTH 2 DAYS Performed at [facility], [address] [place], [place] [address]    Report Status PENDING  Incomplete         Radiology Studies: MR LIVER W WO CONTRAST Result Date: 06/09/2024 CLINICAL DATA:  Hypoechoic liver masses seen on US  and CT. EXAM: MRI ABDOMEN  WITHOUT AND WITH CONTRAST TECHNIQUE: Multiplanar multisequence MR imaging of the abdomen was performed both before and after the administration of intravenous contrast. CONTRAST:  10mL GADAVIST  GADOBUTROL  1 MMOL/ML IV SOLN COMPARISON:  CT angiography chest, abdomen and pelvis from 06/08/2024. FINDINGS: Lower chest: There are linear areas of scarring/atelectasis in the visualized bilateral lungs. Otherwise unremarkable MR appearance to the lung bases. No pleural effusion.  No pericardial effusion. Normal heart size. Hepatobiliary: There is cirrhotic liver morphology. There are innumerable mildly T2 hyperintense lesions throughout the liver occupying more than 25% of the total liver parenchyma. The lesions exhibit arterial hyperenhancement and washout on delayed/equilibrium phase images. Findings favor multifocal HCC. The left portal vein appears expanded with loss of signal void on T2 weighted images. However, there is no measurable contrast enhancement on the postcontrast images. Findings therefore favor acute probable bland thrombus. However, attention on follow-up examination is recommended. There is dilated and recanalized umbilical vein, suggesting sequela of portal hypertension. There is diminutive right portal vein however otherwise patent. The gallbladder is distended with width up to 5.9 cm. There is a stone in the gallbladder neck region. There is mild-to-moderate pericholecystic fat stranding and fluid, which is new since the prior study. In appropriate clinical setting, findings favor acute cholecystitis. No intra or extrahepatic bile duct dilation. No choledocholithiasis. Pancreas: There is a 7 x 10 mm T2 hyperintense nonenhancing structure in the pancreatic head. There are multiple additional subcentimeter sized T2 hyperintense nonenhancing structures in the uncinate process, head, neck and body region of the pancreas (marked with electronic arrow sign on series 3). There is no direct communication of  these lesions with main pancreatic duct. 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 Spleen: Moderately enlarged spleen measuring 10.2 x 16.2 cm orthogonally on coronal plane. No focal lesion. Adrenals/Urinary Tract: Unremarkable adrenal glands. No hydroureteronephrosis. There are multiple simple cysts throughout bilateral kidneys with largest arising from the right kidney laterally measuring up to 7.4 x 7.6 cm. Stomach/Bowel: There is a tiny sliding hiatal hernia. Visualized portions within the abdomen are unremarkable. No disproportionate dilation of bowel loops. Vascular/Lymphatic: No pathologically enlarged lymph nodes identified. No abdominal aortic aneurysm demonstrated. There is mild ascites mainly in the perihepatic and perisplenic region. No walled-off abscess seen. Other:  None. Musculoskeletal: No suspicious bone lesions identified. IMPRESSION: 1. Cirrhotic liver morphology with innumerable lesions throughout the liver occupying more than 25% of the total liver parenchyma. Findings favor multifocal HCC. There is acute probable bland thrombus in the left portal vein. However, attention on follow-up examination is recommended. 2. There is a 7 x 10 mm T2 hyperintense nonenhancing structure in the pancreatic head. There are multiple additional subcentimeter sized T2 hyperintense nonenhancing structures in the uncinate process, head, neck and body region of the pancreas. 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 3. There is a stone in the gallbladder neck region. There is new, mild-to-moderate pericholecystic fat stranding and fluid. Findings favor acute cholecystitis. 4. There is mild ascites mainly in the perihepatic and perisplenic region. No walled-off abscess seen. 5. Multiple other nonacute observations, as described above. Electronically Signed   By: Ree Molt M.D.   On: 06/09/2024 16:32         Scheduled Meds:  sodium chloride    Intravenous Once   empagliflozin   10 mg Oral Q breakfast   levothyroxine   112 mcg Oral QAC breakfast   lidocaine   2 patch Transdermal Q24H   liothyronine   5 mcg Oral q morning   metoprolol  succinate  12.5 mg Oral Daily   Continuous Infusions:  sodium chloride  75 mL/hr at 06/11/24 0539   cefTRIAXone  (ROCEPHIN )  IV 2 g (06/10/24 1228)   metronidazole  500 mg (06/11/24 0055)     LOS: 2 days    Calvin KATHEE Robson, MD [facility]   If 7PM-7AM, please contact night-coverage  06/11/2024, 11:47 AM

## 2024-06-11 NOTE — Progress Notes (Signed)
 Notified by lab,platelets not yet ready at this time.

## 2024-06-11 NOTE — Progress Notes (Signed)
 Pt to CT scan with primary RN. Pt to be transported to ICU18 after CT complete. Family instructed on where to go for ICU waiting room.

## 2024-06-12 ENCOUNTER — Inpatient Hospital Stay

## 2024-06-12 ENCOUNTER — Other Ambulatory Visit: Payer: Self-pay | Admitting: Radiology

## 2024-06-12 DIAGNOSIS — R1011 Right upper quadrant pain: Secondary | ICD-10-CM | POA: Diagnosis not present

## 2024-06-12 LAB — CBC WITH DIFFERENTIAL/PLATELET
Abs Immature Granulocytes: 0.33 K/uL — ABNORMAL HIGH (ref 0.00–0.07)
Abs Immature Granulocytes: 0.38 K/uL — ABNORMAL HIGH (ref 0.00–0.07)
Basophils Absolute: 0.1 K/uL (ref 0.0–0.1)
Basophils Absolute: 0.1 K/uL (ref 0.0–0.1)
Basophils Relative: 0 %
Basophils Relative: 0 %
Eosinophils Absolute: 0 K/uL (ref 0.0–0.5)
Eosinophils Absolute: 0.1 K/uL (ref 0.0–0.5)
Eosinophils Relative: 0 %
Eosinophils Relative: 1 %
HCT: 37.5 % — ABNORMAL LOW (ref 39.0–52.0)
HCT: 39.5 % (ref 39.0–52.0)
Hemoglobin: 13.2 g/dL (ref 13.0–17.0)
Hemoglobin: 13.7 g/dL (ref 13.0–17.0)
Immature Granulocytes: 1 %
Immature Granulocytes: 1 %
Lymphocytes Relative: 47 %
Lymphocytes Relative: 58 %
Lymphs Abs: 13.1 K/uL — ABNORMAL HIGH (ref 0.7–4.0)
Lymphs Abs: 16 K/uL — ABNORMAL HIGH (ref 0.7–4.0)
MCH: 34.8 pg — ABNORMAL HIGH (ref 26.0–34.0)
MCH: 34.5 pg — ABNORMAL HIGH (ref 26.0–34.0)
MCHC: 35.2 g/dL (ref 30.0–36.0)
MCHC: 34.7 g/dL (ref 30.0–36.0)
MCV: 98.9 fL (ref 80.0–100.0)
MCV: 99.5 fL (ref 80.0–100.0)
Monocytes Absolute: 1.8 K/uL — ABNORMAL HIGH (ref 0.1–1.0)
Monocytes Absolute: 1.6 K/uL — ABNORMAL HIGH (ref 0.1–1.0)
Monocytes Relative: 7 %
Monocytes Relative: 6 %
Neutro Abs: 12.3 K/uL — ABNORMAL HIGH (ref 1.7–7.7)
Neutro Abs: 9.4 K/uL — ABNORMAL HIGH (ref 1.7–7.7)
Neutrophils Relative %: 45 %
Neutrophils Relative %: 34 %
Platelets: 132 K/uL — ABNORMAL LOW (ref 150–400)
Platelets: 133 K/uL — ABNORMAL LOW (ref 150–400)
RBC: 3.79 MIL/uL — ABNORMAL LOW (ref 4.22–5.81)
RBC: 3.97 MIL/uL — ABNORMAL LOW (ref 4.22–5.81)
RDW: 15.6 % — ABNORMAL HIGH (ref 11.5–15.5)
RDW: 15.9 % — ABNORMAL HIGH (ref 11.5–15.5)
Smear Review: NORMAL
Smear Review: NORMAL
WBC: 27.6 K/uL — ABNORMAL HIGH (ref 4.0–10.5)
WBC: 27.6 K/uL — ABNORMAL HIGH (ref 4.0–10.5)
nRBC: 0 % (ref 0.0–0.2)
nRBC: 0 % (ref 0.0–0.2)

## 2024-06-12 LAB — PREPARE PLATELET PHERESIS
Unit division: 0
Unit division: 0

## 2024-06-12 LAB — GLUCOSE, CAPILLARY
Glucose-Capillary: 124 mg/dL — ABNORMAL HIGH (ref 70–99)
Glucose-Capillary: 159 mg/dL — ABNORMAL HIGH (ref 70–99)
Glucose-Capillary: 115 mg/dL — ABNORMAL HIGH (ref 70–99)
Glucose-Capillary: 142 mg/dL — ABNORMAL HIGH (ref 70–99)

## 2024-06-12 LAB — PROCALCITONIN: Procalcitonin: 2.39 ng/mL

## 2024-06-12 LAB — COMPREHENSIVE METABOLIC PANEL WITH GFR
ALT: 79 U/L — ABNORMAL HIGH (ref 0–44)
AST: 73 U/L — ABNORMAL HIGH (ref 15–41)
Albumin: 2.2 g/dL — ABNORMAL LOW (ref 3.5–5.0)
Alkaline Phosphatase: 71 U/L (ref 38–126)
Anion gap: 6 (ref 5–15)
BUN: 32 mg/dL — ABNORMAL HIGH (ref 8–23)
CO2: 21 mmol/L — ABNORMAL LOW (ref 22–32)
Calcium: 8.1 mg/dL — ABNORMAL LOW (ref 8.9–10.3)
Chloride: 109 mmol/L (ref 98–111)
Creatinine, Ser: 1 mg/dL (ref 0.61–1.24)
GFR, Estimated: >60 mL/min (ref >60)
Glucose, Bld: 144 mg/dL — ABNORMAL HIGH (ref 70–99)
Potassium: 4.2 mmol/L (ref 3.5–5.1)
Sodium: 136 mmol/L (ref 135–145)
Total Bilirubin: 1.1 mg/dL (ref 0.0–1.2)
Total Protein: 5.2 g/dL — ABNORMAL LOW (ref 6.5–8.1)

## 2024-06-12 LAB — BPAM PLATELET PHERESIS
Blood Product Expiration Date: 202508122359
Blood Product Expiration Date: 202508122359
ISSUE DATE / TIME: 202508100905
ISSUE DATE / TIME: 202508101056
Unit Type and Rh: 600
Unit Type and Rh: 7300

## 2024-06-12 LAB — PROTIME-INR
INR: 1.3 — ABNORMAL HIGH (ref 0.8–1.2)
Prothrombin Time: 16.7 s — ABNORMAL HIGH (ref 11.4–15.2)

## 2024-06-12 LAB — CA 19-9 (SERIAL): CA 19-9: 45 U/mL — ABNORMAL HIGH (ref 0–35)

## 2024-06-12 MED ORDER — METHOCARBAMOL 500 MG PO TABS
1000.0000 mg | ORAL_TABLET | Freq: Three times a day (TID) | ORAL | Status: DC | PRN
  Administered 2024-06-13 – 2024-06-20 (×7): 1000 mg via ORAL
  Filled 2024-06-12 (×5): qty 2

## 2024-06-12 MED ORDER — INSULIN ASPART 100 UNIT/ML IJ SOLN
0.0000 [IU] | Freq: Every day | INTRAMUSCULAR | Status: DC
  Filled 2024-06-12: qty 1

## 2024-06-12 MED ORDER — METHOCARBAMOL 500 MG PO TABS
1000.0000 mg | ORAL_TABLET | Freq: Three times a day (TID) | ORAL | Status: DC
  Filled 2024-06-12: qty 2

## 2024-06-12 MED ORDER — INSULIN ASPART 100 UNIT/ML IJ SOLN
0.0000 [IU] | Freq: Three times a day (TID) | INTRAMUSCULAR | Status: DC
  Filled 2024-06-12 (×3): qty 1

## 2024-06-12 MED ORDER — TECHNETIUM TC 99M MEBROFENIN IV KIT
5.0000 | PACK | Freq: Once | INTRAVENOUS | Status: AC | PRN
  Administered 2024-06-12 (×2): 5.37 via INTRAVENOUS

## 2024-06-12 NOTE — Progress Notes (Signed)
 PROGRESS NOTE    Nicholas Abbott  FMW:[id number] DOB: 25-Oct-1951 DOA: 06/08/2024 PCP: Sherial Bail, MD    Brief Narrative:    73 y.o. male with medical history significant of multivessel CAD status post stenting, HTN, HLD, OSA on CPAP, liver cirrhosis, chronic thrombocytopenia, obesity, presented with new onset of abdominal pain and back pain.     Patient woke up this morning with 7-8/10 dull like abdominal pain and new onset of back pain, both pain has been constant, nonradiating, abdominal pain associated with feeling of nausea but no vomiting.  Patient had a normal bowel movement yesterday and she ate same home-cooked food with his wife yesterday evening and the wife has been okay.  Patient has a history of significant CAD MI many years ago and he described that when he had the MI, his main symptoms was back pain.  But he said also that today's back pain is different in nature compared to MRI associated back pain years ago.  Patient does have a history of cirrhosis with chronic transaminitis and bilirubinemia for which he has been following with PCP, he denied cirrhosis is related to alcohol drinking claiming that he drinks only occasionally and even after stopped drinking alcohol for 2 months his liver enzymes remains elevated.  He was recommended to see GI for further workup recently.  Liver MRI with multiple acute findings.  Needed verbal liver lesions concerning for [facility], pancreatic lesions concerning for IPMN, likely stone in gallbladder neck and radiographic findings of acute cholecystitis  General surgery and oncology consulted.  Recommendations appreciated.  General surgery to take for laparoscopic cholecystectomy on 8/9.  May attempt liver biopsy if lesions are readily accessible  Assessment & Plan:   Principal Problem:   Abdominal pain Active Problems:   Calculus of gallbladder with acute cholecystitis without obstruction   Cirrhosis of liver with ascites (HCC)   Liver  masses  Acute abdominal pain Back pain - No structural abnormality found to explain his symptoms, differential is wide, patient does have a chronic cirrhosis, but image study does not show any significant ascites to implying acute pathology such as SBP, and clinically and image study was low suspicion for bowel obstruction.  Ultrasound with Doppler demonstrated some umbilical vein thrombosis but unclear whether this is related to his symptoms.  Back pain is also of unclear etiology.  Reported to be midthoracic without radiation.  Symptoms have improved. Plan: Pain symptoms improved  Multiple liver lesions Pancreatic lesions Noted on dedicated liver MRI.  Liver lesions concerning for HCC in the background of cirrhosis.  Pancreatic lesions suspicious for IPMN.  AFP elevated.  Patient will need tissue diagnosis.  General surgery was not able to safely perform intraoperative liver biopsy.  Will need IR consult once more medically stable.  Patient may be able to tolerate 8/12.  Discussed with oncology  Acute cholecystitis Sepsis secondary to above Sepsis physiology met with leukocytosis, fever.  Suspected source is infected stone within the gallbladder neck and resultant cholecystitis.  Status post laparoscopic cholecystectomy on 8/9.  Tolerated procedure well.  JP drain left in place. Plan:  Patient has a persistently elevated white blood cell count.  His procalcitonin is downtrending so I do not suspect worsening infection.  Unable to exclude postsurgical bile leak.  Discussed with general surgery.  HIDA scan today.   AKI Suspect prerenal azotemia.  Kidney function worsened since 8/7.  Meets criteria for AKI.  Will need intravenous fluids.  Kidney function near normal on 8/10 Plan:  No IVF for now  Liver cirrhosis Chronic transaminitis Chronic bilirubinemia Chronic thrombocytopenia - Unknown etiology, alcoholic?,  NASH? Autoimune? - Appears to be stable, no significant impairment of  synthetic function of liver isolation normal albumin level and INR within normal limits   CAD - Continue aspirin  Plavix    HTN - Continue hydrochlorothiazide /lisinopril    Sinus bradycardia Frequent PVCs - Compared to his recent office record, baseline heart rate in 40-60s and blood pressure stable.  As per recommendation from his cardiology at [facility], will continue low-dose of metoprolol    Alcohol abuse - Appears to have binge drinking habit but denied ever abusing large amount. - Currently there is no symptoms signs of alcohol withdrawal   DVT prophylaxis: SCD Code Status: Full Family Communication: Spouse at bedside 8/9, 8/11 Disposition Plan: Status is: Inpatient Remains inpatient appropriate because: Multiple acute issues as above     Level of care: Stepdown  Consultants:  None  Procedures:  None  Antimicrobials: Rocephin  Flagyl    Subjective: Seen and examined.  Resting comfortably in bed.  No visible distress.  Pain well-controlled.  Answers all questions appropriately.  Objective: Vitals:   06/12/24 0952 06/12/24 1000 06/12/24 1100 06/12/24 1200  BP:  123/77 117/77 109/69  Pulse: 72 69 65 (!) 57  Resp: (!) 23 18 18 18   Temp:    98 F (36.7 C)  TempSrc:    Oral  SpO2: 90% 90% 93% 91%  Weight:      Height:        Intake/Output Summary (Last 24 hours) at 06/12/2024 1358 Last data filed at 06/12/2024 1200 Gross per 24 hour  Intake 1912.33 ml  Output 3755 ml  Net -1842.67 ml   Filed Weights   06/08/24 0636 06/11/24 1500  Weight: 110.7 kg 115.4 kg    Examination:  General exam: No acute distress Respiratory system: Clear.  Normal work of breathing.  Room air Cardiovascular system: S1-S2, RRR, no murmurs, no pedal edema Gastrointestinal system: Soft, nondistended, RUQ tender, JP drain in place Central nervous system: Alert and oriented. No focal neurological deficits. Extremities: Symmetric 5 x 5 power. Skin: No rashes, lesions or  ulcers Psychiatry: Judgement and insight appear normal. Mood & affect appropriate.     Data Reviewed: I have personally reviewed following labs and imaging studies  CBC: Recent Labs  Lab 06/08/24 0711 06/09/24 0323 06/10/24 0437 06/10/24 1858 06/11/24 0022 06/11/24 0440 06/11/24 0900 06/11/24 1641 06/11/24 1920 06/12/24 0420  WBC 12.4* 25.7* 24.6*  --   --  21.1* 24.1*  --   --  27.6*  NEUTROABS 4.0  --  11.4*  --   --   --  11.0*  --   --  12.3*  HGB 15.4 14.9 14.0   < > 14.6 13.3 13.8 14.4 13.6 13.2  HCT 44.9 43.0 39.4   < > 42.4 38.6* 40.2 41.7  --  37.5*  MCV 101.1* 100.7* 98.7  --   --  100.0 100.2*  --   --  98.9  PLT 91* 83* 75*  --   --  88* 106*  --   --  132*   < > = values in this interval not displayed.   Basic Metabolic Panel: Recent Labs  Lab 06/08/24 0711 06/08/24 0810 06/09/24 0323 06/10/24 0437 06/11/24 0440 06/12/24 0420  NA 138  --  135 133* 131* 136  K 4.8  --  4.6 4.0 4.4 4.2  CL 104  --  105 105 104 109  CO2  21*  --  23 21* 21* 21*  GLUCOSE 152*  --  146* 148* 230* 144*  BUN 20  --  27* 36* 41* 32*  CREATININE 0.98 0.90 1.67* 1.28* 1.24 1.00  CALCIUM  9.5  --  9.0 8.5* 7.6* 8.1*  MG 1.9  --   --   --   --   --    GFR: Estimated Creatinine Clearance: 87.5 mL/min (by C-G formula based on SCr of 1 mg/dL). Liver Function Tests: Recent Labs  Lab 06/08/24 0711 06/09/24 0323 06/11/24 0440 06/12/24 0420  AST 119* 93* 70* 73*  ALT 95* 91* 71* 79*  ALKPHOS 92 86 68 71  BILITOT 2.0* 2.3* 1.1 1.1  PROT 6.3* 5.7* 5.2* 5.2*  ALBUMIN 3.0* 2.7* 2.1* 2.2*   Recent Labs  Lab 06/08/24 0711  LIPASE 48   No results for input(s): AMMONIA in the last 168 hours. Coagulation Profile: Recent Labs  Lab 06/08/24 1328 06/11/24 1641 06/12/24 0420  INR 1.1 1.3* 1.3*   Cardiac Enzymes: No results for input(s): CKTOTAL, CKMB, CKMBINDEX, TROPONINI in the last 168 hours. BNP (last 3 results) No results for input(s): PROBNP in the last 8760  hours. HbA1C: No results for input(s): HGBA1C in the last 72 hours. CBG: Recent Labs  Lab 06/10/24 1352 06/10/24 1742 06/11/24 1520 06/12/24 1131  GLUCAP 124* 138* 188* 159*   Lipid Profile: No results for input(s): CHOL, HDL, LDLCALC, TRIG, CHOLHDL, LDLDIRECT in the last 72 hours. Thyroid Function Tests: No results for input(s): TSH, T4TOTAL, FREET4, T3FREE, THYROIDAB in the last 72 hours.  Anemia Panel: No results for input(s): VITAMINB12, FOLATE, FERRITIN, TIBC, IRON, RETICCTPCT in the last 72 hours. Sepsis Labs: Recent Labs  Lab 06/09/24 0323 06/10/24 0437 06/12/24 0419  PROCALCITON 4.72 4.56 2.39    Recent Results (from the past 240 hours)  MRSA Next Gen by PCR, Nasal     Status: None   Collection Time: 06/09/24  1:09 PM   Specimen: Nasal Mucosa; Nasal Swab  Result Value Ref Range Status   MRSA by PCR Next Gen NOT DETECTED NOT DETECTED Final    Comment: (NOTE) The GeneXpert MRSA Assay (FDA approved for NASAL specimens only), is one component of a comprehensive MRSA colonization surveillance program. It is not intended to diagnose MRSA infection nor to guide or monitor treatment for MRSA infections. Test performance is not FDA approved in patients less than 5 years old. Performed at [facility], [address] [place], [place] [address]   Culture, blood (Routine X 2) w Reflex to ID Panel     Status: None (Preliminary result)   Collection Time: 06/09/24  1:11 PM   Specimen: BLOOD LEFT ARM  Result Value Ref Range Status   Specimen Description BLOOD LEFT ARM  Final   Special Requests   Final    BOTTLES DRAWN AEROBIC AND ANAEROBIC Blood Culture adequate volume   Culture   Final    NO GROWTH 3 DAYS Performed at [facility], [address] [place], [place] [address]    Report Status PENDING  Incomplete  Culture, blood (Routine X 2) w Reflex to ID Panel     Status: None (Preliminary result)   Collection  Time: 06/09/24  1:18 PM   Specimen: BLOOD LEFT HAND  Result Value Ref Range Status   Specimen Description BLOOD LEFT HAND  Final   Special Requests   Final    BOTTLES DRAWN AEROBIC AND ANAEROBIC Blood Culture adequate volume   Culture  Final    NO GROWTH 3 DAYS Performed at [facility], [address] [place], [place] [address]    Report Status PENDING  Incomplete         Radiology Studies: DG Chest [facility] 1 View Result Date: 06/11/2024 CLINICAL DATA:  Shortness of breath EXAM: PORTABLE CHEST 1 VIEW COMPARISON:  06/08/2024 CT FINDINGS: Cardiac shadow is stable. Lungs are well aerated bilaterally. Mild left retrocardiac atelectasis is seen. No effusion is noted. No bony abnormality is seen. IMPRESSION: Mild left retrocardiac atelectasis. Electronically Signed   By: Oneil Devonshire M.D.   On: 06/11/2024 16:58   CT Angio Abd/Pel w/ and/or w/o Addendum Date: 06/11/2024 ADDENDUM REPORT: 06/11/2024 16:20 ADDENDUM: Critical Value/emergent results were called by telephone at the time of interpretation on 06/11/2024 at 4:07 pm to provider [facility] , who verbally acknowledged these results. Electronically Signed   By: Leita Birmingham M.D.   On: 06/11/2024 16:20   Result Date: 06/11/2024 CLINICAL DATA:  Portal hypertension. Retroperitoneal bleed suspected. Possible venous bleeding. Recanalized umbilical vein. EXAM: CTA ABDOMEN AND PELVIS WITHOUT AND WITH CONTRAST TECHNIQUE: Multidetector CT imaging of the abdomen and pelvis was performed using the standard protocol during bolus administration of intravenous contrast. Multiplanar reconstructed images and MIPs were obtained and reviewed to evaluate the vascular anatomy. RADIATION DOSE REDUCTION: This exam was performed according to the departmental dose-optimization program which includes automated exposure control, adjustment of the mA and/or kV according to patient size and/or use of iterative reconstruction technique. CONTRAST:   OMNIPAQUE  IOHEXOL  350 MG/ML SOLN COMPARISON:  06/08/2024. FINDINGS: VASCULAR Aorta: Normal caliber aorta without aneurysm, dissection, vasculitis or significant stenosis. Aortic atherosclerosis. Celiac: Patent without evidence of aneurysm, dissection, vasculitis or significant stenosis. SMA: Patent without evidence of aneurysm, dissection, vasculitis or significant stenosis. Renals: Both renal arteries are patent without evidence of aneurysm, dissection, vasculitis, fibromuscular dysplasia. There is mild 2 moderate stenosis at the renal ostia bilaterally, unchanged. IMA: Patent without evidence of aneurysm, dissection, vasculitis or significant stenosis. Inflow: Patent without evidence of aneurysm, dissection, vasculitis or significant stenosis. Proximal Outflow: Bilateral common femoral and visualized portions of the superficial and profunda femoral arteries are patent without evidence of aneurysm, dissection, vasculitis or significant stenosis. Veins: There is a large recanalized umbilical vein. Filling defects are identified in in the recannulized umbilical vein suggesting nonocclusive thrombus. No active contrast extravasation is seen. Review of the MIP images confirms the above findings. NON-VASCULAR Lower chest: The heart is enlarged and there is a trace pericardial effusion. Multi-vessel coronary artery calcifications are noted. There are trace bilateral pleural effusions with consolidation at the lung bases. There is partial visualization of pneumomediastinum. The ascending aorta is distended measuring 4.2 cm. Hepatobiliary: The liver has a lobular contour and enhances heterogeneously. There is an ill-defined 4.9 x 4.6 cm enhancing lesion in the left lobe of the liver, hepatic segment 4A. No biliary ductal dilatation. Previously described gallbladder stone is no longer seen. There is mild gallbladder wall thickening with surrounding fat stranding and perihepatic free fluid. Pancreas: Previously described  lesions in the pancreas seen on MRI are not visualized on this exam. No pancreatic ductal dilatation or surrounding inflammatory changes. Spleen: Enlarged with multiple calcified granuloma. Adrenals/Urinary Tract: The adrenal glands are within normal limits. The kidneys enhance symmetrically. Renal cysts are noted bilaterally. No renal calculus or hydronephrosis. The bladder is unremarkable. Stomach/Bowel: There is a small hiatal hernia. The stomach is within normal limits. No bowel obstruction or pneumatosis is seen. A small  amount of free air is present in the upper abdomen in dissecting into the anterior and left lateral abdominal wall and into the inguinal region on the left. A few scattered diverticular present along the colon without evidence of diverticulitis. Appendix is not seen. Lymphatic: Prominent lymph nodes are noted at the porta hepatis and gastrohepatic ligament, likely reactive. Reproductive: Prostate is unremarkable. Other: Mesenteric edema and a small amount of ascites is present in the abdomen and pelvis. A drain enters the right lower quadrant and terminates in the right upper quadrant. No acute hemorrhage is seen. Musculoskeletal: Degenerative changes are present in the thoracolumbar spine. No acute osseous abnormality. IMPRESSION: VASCULAR 1. No evidence of active hemorrhage. 2. Recannulized umbilical vein with suspected filling defects at the level liver suggesting nonocclusive thrombus. 3. 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 Recommend annual imaging followup by CTA or MRA. This recommendation follows 2010 ACCF/AHA/AATS/ACR/ASA/SCA/SCAI/SIR/STS/SVM Guidelines for the Diagnosis and Management of Patients with Thoracic Aortic Disease. Circulation. 2010; 121: Z733-z630. Aortic aneurysm NOS (ICD10-I71.9). 4. Remaining findings are unchanged. NON-VASCULAR 1. No evidence of acute hemorrhage. 2. Pneumomediastinum, small amount of free air in the  upper abdomen, and air dissecting in the anterior and left lateral abdominal and pelvic wall which may be iatrogenic given interval placement of right upper quadrant drain. Correlate clinically to exclude the possibility viscus perforation. Chest radiograph is recommended to exclude the possibility of associated pneumothorax. 3. Small bilateral pleural effusions with basilar atelectasis or consolidation. 4. Morphologic changes of cirrhosis and portal hypertension. A large enhancing lesion is seen in the left lobe of the liver, previously characterized as possible hepatocellular carcinoma on recent MRI. 5. Mesenteric edema and mild anasarca. Electronically Signed: By: Leita Birmingham M.D. On: 06/11/2024 15:48        Scheduled Meds:  sodium chloride    Intravenous Once   Chlorhexidine  Gluconate Cloth  6 each Topical Q0600   empagliflozin   10 mg Oral Q breakfast   insulin  aspart  0-15 Units Subcutaneous TID WC   insulin  aspart  0-5 Units Subcutaneous QHS   levothyroxine   112 mcg Oral QAC breakfast   lidocaine   2 patch Transdermal Q24H   liothyronine   5 mcg Oral q morning   metoprolol  succinate  12.5 mg Oral Daily   Continuous Infusions:  cefTRIAXone  (ROCEPHIN )  IV Stopped (06/11/24 1243)   metronidazole  500 mg (06/12/24 1304)     LOS: 3 days    Calvin KATHEE Robson, MD [facility]   If 7PM-7AM, please contact night-coverage  06/12/2024, 1:58 PM

## 2024-06-12 NOTE — Care Management Important Message (Signed)
 Important Message  Patient Details  Name: Nicholas Abbott MRN: [id number] Date of Birth: [birth year]   Important Message Given:  Yes - Medicare IM     [name] 06/12/2024, 12:32 PM

## 2024-06-12 NOTE — Progress Notes (Signed)
 [facility] SURGICAL PROGRESS NOTE  Hospital Day(s): 3.   Post op day(s): 2 Days Post-Op.   Interval History: Patient seen and examined, no acute events or new complaints overnight.  Patient now has consistent high output from the Jackson-Pratt left in the region of the gallbladder fossa, now what appears more bilious than bloody.   Patient reports tolerance of full liquids, good pain control.  Had some back pain when not keeping up charging the bulb.   Review of Systems:  Constitutional: denies fever, chills  Respiratory: denies any shortness of breath  Cardiovascular: denies chest pain or palpitations  Gastrointestinal: denies N/V, or diarrhea/and bowel function as per interval history Musculoskeletal: denies pain, decreased motor or sensation Integumentary: denies any other rashes or skin discolorations  Vital signs in last 24 hours: [min-max] current  Temp:  [97.6 F (36.4 C)-98.8 F (37.1 C)] 97.6 F (36.4 C) (08/11 0400) Pulse Rate:  [54-117] 68 (08/11 0901) Resp:  [10-26] 19 (08/11 0800) BP: (112-145)/(67-86) 127/73 (08/11 0901) SpO2:  [91 %-100 %] 91 % (08/11 0800) Weight:  [115.4 kg] 115.4 kg (08/10 1500)     Height: 6' 2 (188 cm) Weight: 115.4 kg BMI (Calculated): 32.65   Intake/Output last 2 shifts:  08/10 0701 - 08/11 0700 In: 4152.3 [P.O.:620; I.V.:1612.3; Blood:1620; IV Piggyback:300] Out: 3775 [Urine:2925; Drains:850]   Physical Exam:  Constitutional: alert, cooperative and no distress  Respiratory: breathing non-labored at rest  Cardiovascular: regular rate and rhythm  Gastrointestinal: soft, non-tender, and non-distended Integumentary: Skin incisions are clean dry and intact.  Right lower quadrant JP drain with a full bulb consistent with bile, now more than blood.  Labs:     Latest Ref Rng & Units 06/12/2024    4:20 AM 06/11/2024    7:20 PM 06/11/2024    4:41 PM  CBC  WBC 4.0 - 10.5 K/uL 27.6     Hemoglobin 13.0 - 17.0 g/dL 86.7  86.3   85.5   Hematocrit 39.0 - 52.0 % 37.5   41.7   Platelets 150 - 400 K/uL 132         Latest Ref Rng & Units 06/12/2024    4:20 AM 06/11/2024    4:40 AM 06/10/2024    4:37 AM  CMP  Glucose 70 - 99 mg/dL 855  769  851   BUN 8 - 23 mg/dL 32  41  36   Creatinine 0.61 - 1.24 mg/dL 8.99  8.75  8.71   Sodium 135 - 145 mmol/L 136  131  133   Potassium 3.5 - 5.1 mmol/L 4.2  4.4  4.0   Chloride 98 - 111 mmol/L 109  104  105   CO2 22 - 32 mmol/L 21  21  21    Calcium  8.9 - 10.3 mg/dL 8.1  7.6  8.5   Total Protein 6.5 - 8.1 g/dL 5.2  5.2    Total Bilirubin 0.0 - 1.2 mg/dL 1.1  1.1    Alkaline Phos 38 - 126 U/L 71  68    AST 15 - 41 U/L 73  70    ALT 0 - 44 U/L 79  71       Imaging studies: No new pertinent imaging studies   Assessment/Plan:  73 y.o. male with  2 Days Post-Op s/p robotic subtotal cholecystectomy with reconstitution for acute calculus cholecystitis, complicated by pertinent comorbidities including:   Patient Active Problem List   Diagnosis Date Noted   Calculus of gallbladder with acute cholecystitis  without obstruction 06/10/2024   Cirrhosis of liver with ascites (HCC) 06/10/2024   Liver masses 06/10/2024   Abdominal pain 06/08/2024   Closed nondisplaced fracture of fifth cervical vertebra with routine healing 10/25/2023   Pain in joint of right shoulder 10/25/2023   OSA on CPAP 08/30/2023   CPAP use counseling 08/30/2023   S/P angioplasty with stent 05/01/2022   GERD (gastroesophageal reflux disease) 04/28/2021   Sleep apnea 04/28/2021   Hyperlipidemia 04/28/2021   Hyperlipidemia    Hypertension    Diabetes mellitus without complication (HCC)    CAD (coronary artery disease)    Thrombocytopenia (HCC)    Acute metabolic encephalopathy    Hypoglycemia    Type 2 diabetes mellitus without complication, without long-term current use of insulin  (HCC) 02/11/2017   Combined arterial insufficiency and corporo-venous occlusive erectile dysfunction 02/10/2017   Coronary  artery disease 06/06/2014   Hypertension 06/06/2014   Obesity 05/30/2012   Myocardial infarction (HCC) 03/02/2000    - Bleeding site not identified on CTA, seems less an issue now   - Continue to keep up with recharging bulb for Blake drain.  - Transfer to stepdown unit for closer observation and maintenance of drain.  - Will get HIDA to determine if residual GB reconstitution has leaked.   - Despite some acute thrombus in left portal vein, would not entertain anticoagulation at this point.  - May proceed percutaneous liver biopsy as needed.  All of the above findings and recommendations were discussed with the patient, and all of patient's questions were answered to their expressed satisfaction.  -- [name], M.D., [facility] 06/12/2024

## 2024-06-12 NOTE — Plan of Care (Signed)
  Problem: Education: Goal: Knowledge of General Education information will improve Description: Including pain rating scale, medication(s)/side effects and non-pharmacologic comfort measures Outcome: Progressing   Problem: Health Behavior/Discharge Planning: Goal: Ability to manage health-related needs will improve Outcome: Progressing   Problem: Clinical Measurements: Goal: Ability to maintain clinical measurements within normal limits will improve Outcome: Progressing Goal: Will remain free from infection Outcome: Progressing   Problem: Activity: Goal: Risk for activity intolerance will decrease Outcome: Progressing   Problem: Nutrition: Goal: Adequate nutrition will be maintained Outcome: Progressing   Problem: Pain Managment: Goal: General experience of comfort will improve and/or be controlled Outcome: Progressing

## 2024-06-12 NOTE — Plan of Care (Signed)

## 2024-06-13 DIAGNOSIS — R1011 Right upper quadrant pain: Secondary | ICD-10-CM | POA: Diagnosis not present

## 2024-06-13 LAB — GLUCOSE, CAPILLARY
Glucose-Capillary: 178 mg/dL — ABNORMAL HIGH (ref 70–99)
Glucose-Capillary: 122 mg/dL — ABNORMAL HIGH (ref 70–99)
Glucose-Capillary: 140 mg/dL — ABNORMAL HIGH (ref 70–99)

## 2024-06-13 LAB — BASIC METABOLIC PANEL WITH GFR
Anion gap: 5 (ref 5–15)
BUN: 28 mg/dL — ABNORMAL HIGH (ref 8–23)
CO2: 22 mmol/L (ref 22–32)
Calcium: 8.4 mg/dL — ABNORMAL LOW (ref 8.9–10.3)
Chloride: 113 mmol/L — ABNORMAL HIGH (ref 98–111)
Creatinine, Ser: 0.96 mg/dL (ref 0.61–1.24)
GFR, Estimated: >60 mL/min (ref >60)
Glucose, Bld: 114 mg/dL — ABNORMAL HIGH (ref 70–99)
Potassium: 4 mmol/L (ref 3.5–5.1)
Sodium: 140 mmol/L (ref 135–145)

## 2024-06-13 LAB — CBC WITH DIFFERENTIAL/PLATELET
Abs Immature Granulocytes: 0.34 K/uL — ABNORMAL HIGH (ref 0.00–0.07)
Basophils Absolute: 0.1 K/uL (ref 0.0–0.1)
Basophils Relative: 1 %
Eosinophils Absolute: 0.3 K/uL (ref 0.0–0.5)
Eosinophils Relative: 2 %
HCT: 39.4 % (ref 39.0–52.0)
Hemoglobin: 13.5 g/dL (ref 13.0–17.0)
Immature Granulocytes: 2 %
Lymphocytes Relative: 64 %
Lymphs Abs: 13.6 K/uL — ABNORMAL HIGH (ref 0.7–4.0)
MCH: 34 pg (ref 26.0–34.0)
MCHC: 34.3 g/dL (ref 30.0–36.0)
MCV: 99.2 fL (ref 80.0–100.0)
Monocytes Absolute: 1.3 K/uL — ABNORMAL HIGH (ref 0.1–1.0)
Monocytes Relative: 6 %
Neutro Abs: 5.2 K/uL (ref 1.7–7.7)
Neutrophils Relative %: 25 %
Platelets: 131 K/uL — ABNORMAL LOW (ref 150–400)
RBC: 3.97 MIL/uL — ABNORMAL LOW (ref 4.22–5.81)
RDW: 15.8 % — ABNORMAL HIGH (ref 11.5–15.5)
Smear Review: NORMAL
WBC: 20.9 K/uL — ABNORMAL HIGH (ref 4.0–10.5)
nRBC: 0 % (ref 0.0–0.2)

## 2024-06-13 LAB — HEMOGLOBIN A1C
Hgb A1c MFr Bld: 6.7 % — ABNORMAL HIGH (ref 4.8–5.6)
Mean Plasma Glucose: 146 mg/dL

## 2024-06-13 LAB — SURGICAL PATHOLOGY

## 2024-06-13 MED ORDER — DOCUSATE SODIUM 100 MG PO CAPS
100.0000 mg | ORAL_CAPSULE | Freq: Two times a day (BID) | ORAL | Status: DC | PRN
  Administered 2024-06-17: 100 mg via ORAL
  Filled 2024-06-13: qty 1

## 2024-06-13 MED ORDER — POLYETHYLENE GLYCOL 3350 17 G PO PACK
17.0000 g | PACK | Freq: Every day | ORAL | Status: DC | PRN
  Administered 2024-06-14 (×2): 17 g via ORAL
  Filled 2024-06-13: qty 1

## 2024-06-13 NOTE — Progress Notes (Signed)
 06/13/2024  Subjective: Patient is 3 Days Post-Op. No acute events overnight.  Patient reports discomfort with incisions and some in RUQ.  Tolerating diet without nausea or worsening pain.  HIDA scan images reviewed.  Agree that area that lights up is most likely gallbladder remnant given that he had a subtotal cholecystectomy.  Dr. Lane was able to remove all gallstones, so makes sense that the cystic duct is open now leading to the remnant.  Nonetheless, the area that lights up is in a contained space and not spreading throughout to suggest a true leak.  Drain remains with high output.  Vital signs: Temp:  [97.7 F (36.5 C)-98.5 F (36.9 C)] 97.7 F (36.5 C) (08/12 0800) Pulse Rate:  [28-72] 28 (08/12 0800) Resp:  [12-29] 16 (08/12 0800) BP: (101-143)/(61-80) 109/80 (08/12 0800) SpO2:  [63 %-99 %] 99 % (08/12 0800) FiO2 (%):  [21 %] 21 % (08/11 2001)   Intake/Output: 08/11 0701 - 08/12 0700 In: 300.1 [IV Piggyback:300.1] Out: 2470 [Urine:1500; Drains:970] Last BM Date : 06/07/24  Physical Exam: Constitutional:  No acute distress Abdomen:  soft, non-distended, appropriately tender.  Incisions healing well, clean, dry, intact, with surrounding ecchymosis.  Drain with possible bile component but color is more serous than true bilious.  Labs:  Recent Labs    06/12/24 1558 06/13/24 0526  WBC 27.6* 20.9*  HGB 13.7 13.5  HCT 39.5 39.4  PLT 133* 131*   Recent Labs    06/12/24 0420 06/13/24 0526  NA 136 140  K 4.2 4.0  CL 109 113*  CO2 21* 22  GLUCOSE 144* 114*  BUN 32* 28*  CREATININE 1.00 0.96  CALCIUM  8.1* 8.4*   Recent Labs    06/11/24 1641 06/12/24 0420  LABPROT 16.5* 16.7*  INR 1.3* 1.3*    Imaging: NM HEPATOBILIARY LEAK (POST-SURGICAL) Addendum Date: 06/12/2024 ADDENDUM REPORT: 06/12/2024 18:13 ADDENDUM: Further information was provided by the surgeon. Subtotal cholecystectomy performed. The contained activity in the region of the cystic duct is favored  to represent activity in the gallbladder remnant following partial resection. That being the case, no evidence of biliary leak. Electronically Signed   By: Jackquline Boxer M.D.   On: 06/12/2024 18:13   Result Date: 06/12/2024 CLINICAL DATA:  Post surgery pain. Surgical drain in place. Cholecystectomy 06/11/2019 EXAM: NUCLEAR MEDICINE HEPATOBILIARY IMAGING TECHNIQUE: Sequential images of the abdomen were obtained out to 60 minutes following intravenous administration of radiopharmaceutical. RADIOPHARMACEUTICALS:  5.4 mCi Tc-27m  Choletec  IV COMPARISON:  CT 08/11/2024 FINDINGS: The surgical drain was clamped for the procedure. Prompt clearance radiotracer and homogeneous uptake in liver. Counts are evident in the common bile duct and small bowel by 30 minutes. At 60 minutes, a small focus of activity is noted in the region the gallbladder fossa. This activity increases in intensity over the next 30 minutes. The activity remains in the region of the cystic duct without disbursement.a IMPRESSION: 1. Concern for small contained bile leak at the level of the cystic duct remnant. 2. Patent common bile duct. Electronically Signed: By: Jackquline Boxer M.D. On: 06/12/2024 15:51    Assessment/Plan: This is a 73 y.o. male s/p subtotal cholecystectomy  --Discussed with the patient the findings on his HIDA scan.  I do think that the area that lights up corresponds to the gallbladder remnant.  After removing all stones, the cystic duct is now open, leading to the remnant portion.  However, no further spread of the radiotracer beyond this, so no true leak.  Drain color is more dark serous, may have a small bile component, but not truly bilious.  This could represent remnant bile from the surgery itself. --Important to keep up with drain output and emptying/recharging bulb.  Otherwise this fluid will accumulate inside abdomen. --Continue diet as tolerated.  He's NPO after midnight tonight for liver biopsy tomorrow.  His  AFP and CA 19-9 are both elevated. --OK to transfer to floor status. --Continue IV abx.   Aloysius Sheree Plant, MD [facility]

## 2024-06-13 NOTE — Progress Notes (Signed)
 PROGRESS NOTE    Nicholas Abbott  FMW:[id number] DOB: 08-19-1951 DOA: 06/08/2024 PCP: Sherial Bail, MD    Brief Narrative:    73 y.o. male with medical history significant of multivessel CAD status post stenting, HTN, HLD, OSA on CPAP, liver cirrhosis, chronic thrombocytopenia, obesity, presented with new onset of abdominal pain and back pain.     Patient woke up this morning with 7-8/10 dull like abdominal pain and new onset of back pain, both pain has been constant, nonradiating, abdominal pain associated with feeling of nausea but no vomiting.  Patient had a normal bowel movement yesterday and she ate same home-cooked food with his wife yesterday evening and the wife has been okay.  Patient has a history of significant CAD MI many years ago and he described that when he had the MI, his main symptoms was back pain.  But he said also that today's back pain is different in nature compared to MRI associated back pain years ago.  Patient does have a history of cirrhosis with chronic transaminitis and bilirubinemia for which he has been following with PCP, he denied cirrhosis is related to alcohol drinking claiming that he drinks only occasionally and even after stopped drinking alcohol for 2 months his liver enzymes remains elevated.  He was recommended to see GI for further workup recently.  Liver MRI with multiple acute findings.  Needed verbal liver lesions concerning for [facility], pancreatic lesions concerning for IPMN, likely stone in gallbladder neck and radiographic findings of acute cholecystitis  General surgery and oncology consulted.  Recommendations appreciated.  General surgery to take for laparoscopic cholecystectomy on 8/9.  May attempt liver biopsy if lesions are readily accessible  Assessment & Plan:   Principal Problem:   Abdominal pain Active Problems:   Calculus of gallbladder with acute cholecystitis without obstruction   Cirrhosis of liver with ascites (HCC)   Liver  masses  Acute abdominal pain Back pain - No structural abnormality found to explain his symptoms, differential is wide, patient does have a chronic cirrhosis, but image study does not show any significant ascites to implying acute pathology such as SBP, and clinically and image study was low suspicion for bowel obstruction.  Ultrasound with Doppler demonstrated some umbilical vein thrombosis but unclear whether this is related to his symptoms.  Back pain is also of unclear etiology.  Reported to be midthoracic without radiation.  Symptoms have improved. Plan: As needed pain control  Multiple liver lesions Pancreatic lesions Noted on dedicated liver MRI.  Liver lesions concerning for HCC in the background of cirrhosis.  Pancreatic lesions suspicious for IPMN.  AFP elevated.  CA 19-9 elevated patient will need tissue diagnosis.  General surgery was not able to safely perform intraoperative liver biopsy.   Plan: Empirically make n.p.o. after midnight starting 8/13.  Oncology made aware.  Repeat labs in AM.  If reassuring interventional radiology should be contacted to consider image guided biopsy.  Acute cholecystitis Sepsis secondary to above Sepsis physiology met with leukocytosis, fever.  Suspected source is infected stone within the gallbladder neck and resultant cholecystitis.  Status post laparoscopic cholecystectomy on 8/9.  Tolerated procedure well.  JP drain left in place. Plan:  White count downtrending.  20 as of 8/12.  Procalcitonin downtrending as well.  HIDA scan with contained bile leak.  Discussed with general surgery.  The supposed bile leak noted on HIDA is likely secondary to the remnant cystic duct portion.  No further spread of radiotracer beyond this point.  As such this is not a true bile leak and will not require any further intervention.  AKI Suspect prerenal azotemia.  Kidney function worsened since 8/7.  Meets criteria for AKI.  Will need intravenous fluids.  Kidney  function near normal on 8/10 Plan: No further IVF  Liver cirrhosis Chronic transaminitis Chronic bilirubinemia Chronic thrombocytopenia - Unknown etiology, alcoholic?,  NASH? Autoimune? - Appears to be stable, no significant impairment of synthetic function of liver isolation normal albumin level and INR within normal limits   CAD - Continue aspirin  Plavix    HTN - Continue hydrochlorothiazide /lisinopril    Sinus bradycardia Frequent PVCs - Compared to his recent office record, baseline heart rate in 40-60s and blood pressure stable.  As per recommendation from his cardiology at [facility], will continue low-dose of metoprolol    Alcohol abuse - Appears to have binge drinking habit but denied ever abusing large amount. - Currently there is no symptoms signs of alcohol withdrawal   DVT prophylaxis: SCD Code Status: Full Family Communication: Spouse at bedside 8/9, 8/11 Disposition Plan: Status is: Inpatient Remains inpatient appropriate because: Multiple acute issues as above     Level of care: Telemetry Medical  Consultants:  None  Procedures:  None  Antimicrobials: Rocephin  Flagyl    Subjective: Seen and examined peer resting comfortably in bed.  Pain well-controlled.  JP drain evaluated.  Output more serosanguineous.  Objective: Vitals:   06/13/24 0900 06/13/24 1000 06/13/24 1007 06/13/24 1122  BP: 137/75 130/84  (!) 159/91  Pulse: 60 63 67 62  Resp: (!) 21 20  16   Temp:    98.9 F (37.2 C)  TempSrc:    Oral  SpO2: 94% 94%  96%  Weight:      Height:        Intake/Output Summary (Last 24 hours) at 06/13/2024 1515 Last data filed at 06/13/2024 1417 Gross per 24 hour  Intake 540.08 ml  Output 1890 ml  Net -1349.92 ml   Filed Weights   06/08/24 0636 06/11/24 1500  Weight: 110.7 kg 115.4 kg    Examination:  General exam: NAD Respiratory system: Lungs clear.  Normal work of breathing.  Room air Cardiovascular system: S1-S2, RRR, no murmurs, no pedal  edema Gastrointestinal system: Soft, nondistended, RUQ tender, JP drain in place Central nervous system: Alert and oriented. No focal neurological deficits. Extremities: Symmetric 5 x 5 power. Skin: No rashes, lesions or ulcers Psychiatry: Judgement and insight appear normal. Mood & affect appropriate.     Data Reviewed: I have personally reviewed following labs and imaging studies  CBC: Recent Labs  Lab 06/10/24 0437 06/10/24 1858 06/11/24 0440 06/11/24 0900 06/11/24 1641 06/11/24 1920 06/12/24 0420 06/12/24 1558 06/13/24 0526  WBC 24.6*  --  21.1* 24.1*  --   --  27.6* 27.6* 20.9*  NEUTROABS 11.4*  --   --  11.0*  --   --  12.3* 9.4* 5.2  HGB 14.0   < > 13.3 13.8 14.4 13.6 13.2 13.7 13.5  HCT 39.4   < > 38.6* 40.2 41.7  --  37.5* 39.5 39.4  MCV 98.7  --  100.0 100.2*  --   --  98.9 99.5 99.2  PLT 75*  --  88* 106*  --   --  132* 133* 131*   < > = values in this interval not displayed.   Basic Metabolic Panel: Recent Labs  Lab 06/08/24 0711 06/08/24 0810 06/09/24 0323 06/10/24 0437 06/11/24 0440 06/12/24 0420 06/13/24 0526  NA 138  --  135 133* 131* 136 140  K 4.8  --  4.6 4.0 4.4 4.2 4.0  CL 104  --  105 105 104 109 113*  CO2 21*  --  23 21* 21* 21* 22  GLUCOSE 152*  --  146* 148* 230* 144* 114*  BUN 20  --  27* 36* 41* 32* 28*  CREATININE 0.98   < > 1.67* 1.28* 1.24 1.00 0.96  CALCIUM  9.5  --  9.0 8.5* 7.6* 8.1* 8.4*  MG 1.9  --   --   --   --   --   --    < > = values in this interval not displayed.   GFR: Estimated Creatinine Clearance: 91.2 mL/min (by C-G formula based on SCr of 0.96 mg/dL). Liver Function Tests: Recent Labs  Lab 06/08/24 0711 06/09/24 0323 06/11/24 0440 06/12/24 0420  AST 119* 93* 70* 73*  ALT 95* 91* 71* 79*  ALKPHOS 92 86 68 71  BILITOT 2.0* 2.3* 1.1 1.1  PROT 6.3* 5.7* 5.2* 5.2*  ALBUMIN 3.0* 2.7* 2.1* 2.2*   Recent Labs  Lab 06/08/24 0711  LIPASE 48   No results for input(s): AMMONIA in the last 168  hours. Coagulation Profile: Recent Labs  Lab 06/08/24 1328 06/11/24 1641 06/12/24 0420  INR 1.1 1.3* 1.3*   Cardiac Enzymes: No results for input(s): CKTOTAL, CKMB, CKMBINDEX, TROPONINI in the last 168 hours. BNP (last 3 results) No results for input(s): PROBNP in the last 8760 hours. HbA1C: Recent Labs    06/12/24 0419  HGBA1C 6.7*   CBG: Recent Labs  Lab 06/11/24 1520 06/12/24 1131 06/12/24 1557 06/12/24 2110 06/13/24 1134  GLUCAP 188* 159* 115* 142* 178*   Lipid Profile: No results for input(s): CHOL, HDL, LDLCALC, TRIG, CHOLHDL, LDLDIRECT in the last 72 hours. Thyroid Function Tests: No results for input(s): TSH, T4TOTAL, FREET4, T3FREE, THYROIDAB in the last 72 hours.  Anemia Panel: No results for input(s): VITAMINB12, FOLATE, FERRITIN, TIBC, IRON, RETICCTPCT in the last 72 hours. Sepsis Labs: Recent Labs  Lab 06/09/24 0323 06/10/24 0437 06/12/24 0419  PROCALCITON 4.72 4.56 2.39    Recent Results (from the past 240 hours)  MRSA Next Gen by PCR, Nasal     Status: None   Collection Time: 06/09/24  1:09 PM   Specimen: Nasal Mucosa; Nasal Swab  Result Value Ref Range Status   MRSA by PCR Next Gen NOT DETECTED NOT DETECTED Final    Comment: (NOTE) The GeneXpert MRSA Assay (FDA approved for NASAL specimens only), is one component of a comprehensive MRSA colonization surveillance program. It is not intended to diagnose MRSA infection nor to guide or monitor treatment for MRSA infections. Test performance is not FDA approved in patients less than 78 years old. Performed at [facility], [address] [place], [place] [address]   Culture, blood (Routine X 2) w Reflex to ID Panel     Status: None (Preliminary result)   Collection Time: 06/09/24  1:11 PM   Specimen: BLOOD LEFT ARM  Result Value Ref Range Status   Specimen Description BLOOD LEFT ARM  Final   Special Requests   Final    BOTTLES DRAWN  AEROBIC AND ANAEROBIC Blood Culture adequate volume   Culture   Final    NO GROWTH 3 DAYS Performed at [facility], [address], [place], [place] [address]    Report Status PENDING  Incomplete  Culture, blood (Routine X 2) w Reflex to ID Panel  Status: None (Preliminary result)   Collection Time: 06/09/24  1:18 PM   Specimen: BLOOD LEFT HAND  Result Value Ref Range Status   Specimen Description BLOOD LEFT HAND  Final   Special Requests   Final    BOTTLES DRAWN AEROBIC AND ANAEROBIC Blood Culture adequate volume   Culture   Final    NO GROWTH 3 DAYS Performed at [facility], [address], [place], [place] [address]    Report Status PENDING  Incomplete         Radiology Studies: NM HEPATOBILIARY LEAK (POST-SURGICAL) Addendum Date: 06/12/2024 ADDENDUM REPORT: 06/12/2024 18:13 ADDENDUM: Further information was provided by the surgeon. Subtotal cholecystectomy performed. The contained activity in the region of the cystic duct is favored to represent activity in the gallbladder remnant following partial resection. That being the case, no evidence of biliary leak. Electronically Signed   By: Jackquline Boxer M.D.   On: 06/12/2024 18:13   Result Date: 06/12/2024 CLINICAL DATA:  Post surgery pain. Surgical drain in place. Cholecystectomy 06/11/2019 EXAM: NUCLEAR MEDICINE HEPATOBILIARY IMAGING TECHNIQUE: Sequential images of the abdomen were obtained out to 60 minutes following intravenous administration of radiopharmaceutical. RADIOPHARMACEUTICALS:  5.4 mCi Tc-66m  Choletec  IV COMPARISON:  CT 08/11/2024 FINDINGS: The surgical drain was clamped for the procedure. Prompt clearance radiotracer and homogeneous uptake in liver. Counts are evident in the common bile duct and small bowel by 30 minutes. At 60 minutes, a small focus of activity is noted in the region the gallbladder fossa. This activity increases in intensity over the next 30 minutes. The activity remains in  the region of the cystic duct without disbursement.a IMPRESSION: 1. Concern for small contained bile leak at the level of the cystic duct remnant. 2. Patent common bile duct. Electronically Signed: By: Jackquline Boxer M.D. On: 06/12/2024 15:51   DG Chest Port 1 View Result Date: 06/11/2024 CLINICAL DATA:  Shortness of breath EXAM: PORTABLE CHEST 1 VIEW COMPARISON:  06/08/2024 CT FINDINGS: Cardiac shadow is stable. Lungs are well aerated bilaterally. Mild left retrocardiac atelectasis is seen. No effusion is noted. No bony abnormality is seen. IMPRESSION: Mild left retrocardiac atelectasis. Electronically Signed   By: Oneil Devonshire M.D.   On: 06/11/2024 16:58        Scheduled Meds:  sodium chloride    Intravenous Once   Chlorhexidine  Gluconate Cloth  6 each Topical Q0600   empagliflozin   10 mg Oral Q breakfast   insulin  aspart  0-15 Units Subcutaneous TID WC   insulin  aspart  0-5 Units Subcutaneous QHS   levothyroxine   112 mcg Oral QAC breakfast   lidocaine   2 patch Transdermal Q24H   liothyronine   5 mcg Oral q morning   metoprolol  succinate  12.5 mg Oral Daily   Continuous Infusions:  cefTRIAXone  (ROCEPHIN )  IV 2 g (06/13/24 1323)   metronidazole  500 mg (06/13/24 1319)     LOS: 4 days    Calvin KATHEE Robson, MD [facility]   If 7PM-7AM, please contact night-coverage  06/13/2024, 3:15 PM

## 2024-06-13 NOTE — Plan of Care (Signed)

## 2024-06-13 NOTE — Progress Notes (Signed)
 Pt transferred. AxOx4. VSS. RA. All patient belongings kept at bedside transferred with patient.

## 2024-06-14 ENCOUNTER — Inpatient Hospital Stay: Admitting: Radiology

## 2024-06-14 DIAGNOSIS — K839 Disease of biliary tract, unspecified: Secondary | ICD-10-CM | POA: Diagnosis not present

## 2024-06-14 DIAGNOSIS — K8000 Calculus of gallbladder with acute cholecystitis without obstruction: Secondary | ICD-10-CM | POA: Diagnosis not present

## 2024-06-14 DIAGNOSIS — I81 Portal vein thrombosis: Secondary | ICD-10-CM | POA: Diagnosis present

## 2024-06-14 HISTORY — PX: IR US LIVER BIOPSY: IMG936

## 2024-06-14 LAB — COMPREHENSIVE METABOLIC PANEL WITH GFR
ALT: 79 U/L — ABNORMAL HIGH (ref 0–44)
AST: 64 U/L — ABNORMAL HIGH (ref 15–41)
Albumin: 2 g/dL — ABNORMAL LOW (ref 3.5–5.0)
Alkaline Phosphatase: 73 U/L (ref 38–126)
Anion gap: 9 (ref 5–15)
BUN: 23 mg/dL (ref 8–23)
CO2: 22 mmol/L (ref 22–32)
Calcium: 8.3 mg/dL — ABNORMAL LOW (ref 8.9–10.3)
Chloride: 109 mmol/L (ref 98–111)
Creatinine, Ser: 1 mg/dL (ref 0.61–1.24)
GFR, Estimated: >60 mL/min (ref >60)
Glucose, Bld: 130 mg/dL — ABNORMAL HIGH (ref 70–99)
Potassium: 3.6 mmol/L (ref 3.5–5.1)
Sodium: 140 mmol/L (ref 135–145)
Total Bilirubin: 1.3 mg/dL — ABNORMAL HIGH (ref 0.0–1.2)
Total Protein: 5 g/dL — ABNORMAL LOW (ref 6.5–8.1)

## 2024-06-14 LAB — CULTURE, BLOOD (ROUTINE X 2)
Culture: NO GROWTH
Culture: NO GROWTH
Special Requests: ADEQUATE
Special Requests: ADEQUATE

## 2024-06-14 LAB — CBC WITH DIFFERENTIAL/PLATELET
Abs Immature Granulocytes: 0.79 K/uL — ABNORMAL HIGH (ref 0.00–0.07)
Basophils Absolute: 0.2 K/uL — ABNORMAL HIGH (ref 0.0–0.1)
Basophils Relative: 1 %
Eosinophils Absolute: 0.5 K/uL (ref 0.0–0.5)
Eosinophils Relative: 2 %
HCT: 42.4 % (ref 39.0–52.0)
Hemoglobin: 14.5 g/dL (ref 13.0–17.0)
Immature Granulocytes: 4 %
Lymphocytes Relative: 61 %
Lymphs Abs: 13.1 K/uL — ABNORMAL HIGH (ref 0.7–4.0)
MCH: 34 pg (ref 26.0–34.0)
MCHC: 34.2 g/dL (ref 30.0–36.0)
MCV: 99.5 fL (ref 80.0–100.0)
Monocytes Absolute: 1.7 K/uL — ABNORMAL HIGH (ref 0.1–1.0)
Monocytes Relative: 8 %
Neutro Abs: 5.1 K/uL (ref 1.7–7.7)
Neutrophils Relative %: 24 %
Platelets: 136 K/uL — ABNORMAL LOW (ref 150–400)
RBC: 4.26 MIL/uL (ref 4.22–5.81)
RDW: 16.1 % — ABNORMAL HIGH (ref 11.5–15.5)
Smear Review: NORMAL
WBC: 21.4 K/uL — ABNORMAL HIGH (ref 4.0–10.5)
nRBC: 0.1 % (ref 0.0–0.2)

## 2024-06-14 LAB — PROTIME-INR
INR: 1.2 (ref 0.8–1.2)
Prothrombin Time: 15.9 s — ABNORMAL HIGH (ref 11.4–15.2)

## 2024-06-14 LAB — GLUCOSE, CAPILLARY
Glucose-Capillary: 128 mg/dL — ABNORMAL HIGH (ref 70–99)
Glucose-Capillary: 131 mg/dL — ABNORMAL HIGH (ref 70–99)
Glucose-Capillary: 113 mg/dL — ABNORMAL HIGH (ref 70–99)
Glucose-Capillary: 149 mg/dL — ABNORMAL HIGH (ref 70–99)

## 2024-06-14 LAB — APTT: aPTT: 33 s (ref 24–36)

## 2024-06-14 LAB — PATHOLOGIST SMEAR REVIEW

## 2024-06-14 MED ORDER — LIDOCAINE 1 % OPTIME INJ - NO CHARGE
2.0000 mL | Freq: Once | INTRAMUSCULAR | Status: DC
  Filled 2024-06-14: qty 2

## 2024-06-14 MED ORDER — ENSURE PLUS HIGH PROTEIN PO LIQD
237.0000 mL | Freq: Three times a day (TID) | ORAL | Status: DC
  Administered 2024-06-17 – 2024-06-21 (×5): 237 mL via ORAL

## 2024-06-14 MED ORDER — MIDAZOLAM HCL 2 MG/2ML IJ SOLN
INTRAMUSCULAR | Status: AC
  Filled 2024-06-14: qty 2

## 2024-06-14 MED ORDER — MIDAZOLAM HCL 5 MG/5ML IJ SOLN
INTRAMUSCULAR | Status: AC | PRN
  Administered 2024-06-14 (×2): 1 mg via INTRAVENOUS

## 2024-06-14 MED ORDER — HEPARIN (PORCINE) 25000 UT/250ML-% IV SOLN
1650.0000 [IU]/h | INTRAVENOUS | Status: DC
  Administered 2024-06-14 (×2): 1650 [IU]/h via INTRAVENOUS
  Filled 2024-06-14: qty 250

## 2024-06-14 MED ORDER — ADULT MULTIVITAMIN W/MINERALS CH
1.0000 | ORAL_TABLET | Freq: Every day | ORAL | Status: DC
  Administered 2024-06-15 – 2024-06-21 (×7): 1 via ORAL
  Filled 2024-06-14 (×7): qty 1

## 2024-06-14 MED ORDER — FENTANYL CITRATE (PF) 100 MCG/2ML IJ SOLN
INTRAMUSCULAR | Status: AC | PRN
  Administered 2024-06-14 (×2): 50 ug via INTRAVENOUS

## 2024-06-14 MED ORDER — HEPARIN BOLUS VIA INFUSION
7400.0000 [IU] | Freq: Once | INTRAVENOUS | Status: AC
  Administered 2024-06-14 (×2): 7400 [IU] via INTRAVENOUS
  Filled 2024-06-14: qty 7400

## 2024-06-14 MED ORDER — FENTANYL CITRATE (PF) 100 MCG/2ML IJ SOLN
INTRAMUSCULAR | Status: AC
  Filled 2024-06-14: qty 2

## 2024-06-14 NOTE — TOC Progression Note (Signed)
 Transition of Care [facility]) - Progression Note    Patient Details  Name: Nicholas Abbott MRN: [id number] Date of Birth: July 30, 1951  Transition of Care [facility]) CM/SW Contact  Marinda Cooks, RN Phone Number: 06/14/2024, 12:14 PM  Clinical Narrative:     Pt HOD#6 and POD#4 73 y.o. male with  4 Days Post-Op s/p robotic subtotal cholecystectomy with reconstitution for acute calculus cholecystitis. Pt scheduled for liver bx tomm per chart review. TOC will cont to follow dc planning / care coordination and update as applicable.                      Expected Discharge Plan and Services                                               Social Drivers of Health (SDOH) Interventions SDOH Screenings   Food Insecurity: No Food Insecurity (06/08/2024)  Housing: Low Risk  (06/08/2024)  Transportation Needs: No Transportation Needs (06/08/2024)  Utilities: Not At Risk (06/08/2024)  Depression (PHQ2-9): Medium Risk (08/17/2022)  Financial Resource Strain: Low Risk  (03/07/2024)   Received from [facility]  Social Connections: Socially Integrated (06/08/2024)  Tobacco Use: Low Risk  (06/08/2024)    Readmission Risk Interventions     No data to display

## 2024-06-14 NOTE — Hospital Course (Addendum)
 73yo with h/o CAD s/p stenting, HTN, HLD, OSA on CPAP, cirrhosis with chronic thrombocytopenia, and class 1 obesity who presented on 8/7 with abdominal pain.  Found to have acute cholecystitis, now s/p lap chole with drain placement.  Had biliary leak and required stent placement.  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  Oncology is also following.  Also with portal venous thrombosis, needs AC.  Assessment and Plan: Sepsis due to acute cholecystitis, now with bile leak Sepsis physiology on presentation, has resolved Suspected source cholecystitis, s/p lap chole on 8/9 JP drain left in place and continues to have copious brown drainage HIDA scan with contained bile leak, likely secondary to the remnant cystic duct portion GI was consulted  ERCP on 8/14 with biliary sphincterotomy and stent placement; will need f/u ERCP in 3 months to remove stent Still with significant biliary drain output, ongoing despite stent Planned to stop Ceftriaxone /metronidazole  after 8/14 dose, as he has completed 7 days of therapy; however, still with uptrending leukocytosis so continuing antibiotics for now WBC peaked on 8/16 at 45,000, unclear etiology but likely intra-abdominal pathology given ongoing biliary drainage + leukocytosis; appears to be downtrending Surgery is aware and also closely monitoring Discussed with ID Dr. Luiz - will change antibiotics to Unasyn  to cover Enterococcus + Fluconazole ; WBC count slow improving IR drained some fluid 8/18, culture negative for bacteria and fungus. The volume of the JP drain has dramatically decreased.  Leukocytosis is gradually getting better, however, majority of cells are lymphocytes instead of neutrophils.  Will continue additional 3 days of Augmentin  and Diflucan  to complete a 7-day course since the restart of antibiotics. Surgery has cleared patient for discharge.   Multiple liver and pancreatic lesions Noted on dedicated liver  MRI Liver lesions concerning for HCC in the background of cirrhosis; pathology from 8/13 confirmed moderate differentiated hepatocellular carcinoma Pancreatic lesions suspicious for IPMN AFP and CA 19-9 elevated Oncology also consulted, has been notified of result and Dr. Melanee has arranged for outpatient f/u on 8/22 (she saw the patient on 8/15 and is planning to present at tumor board, but there is no note available at this time) Be followed with Dr. Melanee in 1 week.   Portal venous thrombosis Started on heparin  briefly but this will be held until after ERCP on 8/14 Started heparin  on 8/15; transitioned to Eliquis  on 8/18 Pharmacy says Eliquis  will cost about $115 per month; patient and wife have confirmed with [facility] and want to proceed with Eliquis .   Liver cirrhosis with chronic transaminitis/hyperbilirubinemia and chronic thrombocytopenia Unknown etiology Does not appear to have had a significant ETOH use d/o in his lifetime so that is unlikely etiology Appears to be stable, no significant impairment of synthetic function of liver with normal albumin level and INR   CAD Patient was restarted on anticoagulation, Plavix  discontinued,    HLD Resume Zetia, rosuvastatin  at time of dc He has had mild transaminitis and will need f/u LFTs as an outpatient   Hypothyroidism Continue Synthroid , Cytomel    Sinus bradycardia Frequent PVCs Essential hypertension Has sinus bradycardia, blood pressure not significant elevated.  Would hold all blood pressure medicines at discharge.  Follow-up with PCP to restart if needed.

## 2024-06-14 NOTE — Consult Note (Signed)
 PHARMACY - ANTICOAGULATION CONSULT NOTE  Pharmacy Consult for heparin  Indication: portal vein thrombus  Allergies  Allergen Reactions   Pioglitazone Rash    Patient Measurements: Height: 6' 2 (188 cm) Weight: 115.4 kg (254 lb 6.6 oz) IBW/kg (Calculated) : 82.2 HEPARIN  DW (KG): 105.1  Vital Signs: Temp: 97.8 F (36.6 C) (08/13 0751) Temp Source: Oral (08/13 0751) BP: 132/73 (08/13 0751) Pulse Rate: 55 (08/13 0751)  Labs: Recent Labs    06/11/24 1641 06/11/24 1920 06/12/24 0420 06/12/24 1558 06/13/24 0526 06/14/24 0614  HGB 14.4   < > 13.2 13.7 13.5 14.5  HCT 41.7  --  37.5* 39.5 39.4 42.4  PLT  --    < > 132* 133* 131* 136*  LABPROT 16.5*  --  16.7*  --   --  15.9*  INR 1.3*  --  1.3*  --   --  1.2  CREATININE  --   --  1.00  --  0.96 1.00   < > = values in this interval not displayed.    Estimated Creatinine Clearance: 87.5 mL/min (by C-G formula based on SCr of 1 mg/dL).   Medical History: Past Medical History:  Diagnosis Date   Basal cell carcinoma    L ear, txted in past   Diabetes mellitus without complication (HCC)    GERD (gastroesophageal reflux disease)    Hyperlipidemia    Hypertension    Left shoulder pain    Myocardial infarction (HCC)     Medications:  No chronic AC PTA PTA aspirin  81mg   Assessment: Patient is 73yom admitted for abdominal pain on 8/7 and PMH of HTN, HLD, diabetes, CAD s/p stenting, OSA on CPAP, obesity (BMI 32.7), chronic thrombocytopenia, and liver cirrhosis. S/p cholecystectomy on 8/9. Imaging notable for acute non-occlusive left portal vein thrombus. Pharmacy consulted to start heparin  for portal vein thrombus. Patient is vital sign stable with CBC labs including Hgb 14.5 (BL 15-16) and plt 136 (BL ~100) at his baselines. No prior AC before admission nor during.  Goal of Therapy:  Heparin  level 0.3-0.7 units/ml Monitor platelets by anticoagulation protocol: Yes   Plan:  Give 7400 units bolus x 1 Start heparin   infusion at 1650 units/hr Check anti-Xa level in 8 hours and daily while on heparin  Continue to monitor H&H and platelets  Current plan is to transition to enoxaparin  following liver biopsy  Leonor BROCKS Kassidie Hendriks 06/14/2024,9:05 AM

## 2024-06-14 NOTE — Consult Note (Signed)
 Chief Complaint: Hepatic lesions  Referring Provider(s): [name]  Supervising Physician: Karalee Beat  Patient Status: [facility] - In-pt  History of Present Illness: Nicholas Abbott is a 73 y.o. male with PMH which includes HLD, HTN, CAD (s/p stenting), OSA (CPAP), DM, cirrhosis, thrombocytopenia. He presented to the ED for abd pain and back pain. During care, he was treated for sepsis related to acute calculus cholecystitis and subsequently underwent cholecystectomy with remaining JP. MRI ordered as part of workup also showed liver lesions: Cirrhotic liver morphology with innumerable lesions throughout the liver occupying more than 25% of the total liver parenchyma. Findings favor multifocal HCC. Also noted were pancreatic lesions suspicious for IPMN.   AFP elevated.  CA 19-9 elevated.   General surgery was not able to safely perform intraoperative liver biopsy.   IR consulted for liver lesion biopsy now that patient's clinical picture has improved.   He has been NPO since MN.  Denies fever, chills, SOB, CP, sore throat, N/V, blood in stool or urine. He endorses RUQ abd soreness.   Allergies Reviewed:  Pioglitazone   Patient is Full Code  Past Medical History:  Diagnosis Date   Basal cell carcinoma    L ear, txted in past   Diabetes mellitus without complication (HCC)    GERD (gastroesophageal reflux disease)    Hyperlipidemia    Hypertension    Left shoulder pain    Myocardial infarction [facility])     Past Surgical History:  Procedure Laterality Date   CATARACT EXTRACTION     COLONOSCOPY WITH PROPOFOL  N/A 01/01/2020   Procedure: COLONOSCOPY WITH PROPOFOL ;  Surgeon: [name], [name], MD;  Location: [facility] ENDOSCOPY;  Service: Gastroenterology;  Laterality: N/A;   CORONARY ANGIOPLASTY     ESOPHAGOGASTRODUODENOSCOPY (EGD) WITH PROPOFOL  N/A 01/01/2020   Procedure: ESOPHAGOGASTRODUODENOSCOPY (EGD) WITH PROPOFOL ;  Surgeon: [name], [name], MD;  Location: [facility] ENDOSCOPY;   Service: Gastroenterology;  Laterality: N/A;   EYE SURGERY     LEFT HEART CATH AND CORONARY ANGIOGRAPHY N/A 04/29/2022   Procedure: LEFT HEART CATH AND CORONARY ANGIOGRAPHY;  Surgeon: [name], MD;  Location: [facility] INVASIVE CV LAB;  Service: Cardiovascular;  Laterality: N/A;   RETINAL DETACHMENT SURGERY        Medications: Prior to Admission medications   Medication Sig Start Date End Date Taking? Authorizing Provider  acetaminophen  (TYLENOL ) 500 MG tablet Take 1,000 mg by mouth every 8 (eight) hours as needed.   Yes [provider]  aspirin  EC 81 MG tablet Take 81 mg by mouth daily.   Yes [provider]  Cinnamon 500 MG capsule Take 1,000 mg by mouth daily.   Yes [provider]  Cyanocobalamin  (VITAMIN B-12) 5000 MCG TBDP Take 5,000 mcg by mouth daily.    Yes [provider]  empagliflozin  (JARDIANCE ) 10 MG TABS tablet Take 1 tablet by mouth daily with breakfast. 02/16/22  Yes [provider]  ezetimibe (ZETIA) 10 MG tablet Take 10 mg by mouth daily. 04/06/24 04/06/25 Yes [provider]  folic acid -pyridoxine -cyancobalamin (FOLTX) 2.5-25-2 MG TABS tablet Take 1 tablet by mouth daily. 02/08/17  Yes [provider]  hydrocortisone  2.5 % cream APPLY TO THE GROIN DAILY ON MONDAY, WEDNESDAY, AND FRIDAY. 01/27/23  Yes [name], MD  ketoconazole  (NIZORAL ) 2 % cream APPLY TO THE GROIN AT BEDTIME 12/20/23  Yes [name], MD  levothyroxine  (SYNTHROID ) 112 MCG tablet Take 112 mcg by mouth daily. 02/20/22  Yes [provider]  liothyronine  (CYTOMEL ) 5 MCG tablet Take 5 mcg by mouth every morning.   Yes [provider]  lisinopril -hydrochlorothiazide  (ZESTORETIC ) 20-12.5 MG tablet Take 1 tablet by mouth daily. 04/06/23 06/08/24 Yes [provider]  metoprolol  succinate (TOPROL -XL) 25 MG 24 hr tablet Take 0.5 tablets (12.5 mg total) by mouth daily. 04/29/22 06/08/24 Yes [name], MD   nitroGLYCERIN  (NITROSTAT ) 0.4 MG SL tablet Place 0.4 mg under the tongue every 5 (five) minutes as needed for chest pain. 05/01/22  Yes [provider]  rosuvastatin  (CRESTOR ) 40 MG tablet Take by mouth. 04/23/22  Yes [provider]  tadalafil (CIALIS) 5 MG tablet Take 5 mg by mouth daily as needed for erectile dysfunction. 08/31/22  Yes [provider]  traMADol  (ULTRAM ) 50 MG tablet Take 50 mg by mouth daily as needed for severe pain or moderate pain. 02/05/21  Yes [provider]  Blood Glucose Monitoring Suppl DEVI 1 each by Does not apply route in the morning, at noon, and at bedtime. May substitute to any manufacturer covered by patient's insurance. 01/22/23   [name], MD  clopidogrel  (PLAVIX ) 75 MG tablet Take 75 mg by mouth daily. Patient not taking: Reported on 06/08/2024    [provider]  PHOSPHATIDYLSERINE PO Take by mouth.    [provider]     Family History  Problem Relation Age of Onset   Leukemia Father     Social History   Socioeconomic History   Marital status: Married    Spouse name: Not on file   Number of children: Not on file   Years of education: Not on file   Highest education level: Not on file  Occupational History   Not on file  Tobacco Use   Smoking status: Never   Smokeless tobacco: Never  Vaping Use   Vaping status: Never Used  Substance and Sexual Activity   Alcohol use: Yes    Alcohol/week: 5.0 standard drinks of alcohol    Types: 5 Shots of liquor per week   Drug use: Never   Sexual activity: Yes    Birth control/protection: Post-menopausal  Other Topics Concern   Not on file  Social History Narrative   Not on file   Social Drivers of Health   Financial Resource Strain: Low Risk  (03/07/2024)   Received from [facility]   Overall Financial Resource Strain (CARDIA)    Difficulty of Paying Living Expenses: Not hard at all  Food Insecurity: No Food Insecurity  (06/08/2024)   Hunger Vital Sign    Worried About Running Out of Food in the Last Year: Never true    Ran Out of Food in the Last Year: Never true  Transportation Needs: No Transportation Needs (06/08/2024)   PRAPARE - Administrator, Civil Service (Medical): No    Lack of Transportation (Non-Medical): No  Physical Activity: Not on file  Stress: Not on file  Social Connections: Socially Integrated (06/08/2024)   Social Connection and Isolation Panel    Frequency of Communication with Friends and Family: More than three times a week    Frequency of Social Gatherings with Friends and Family: More than three times a week    Attends Religious Services: 1 to 4 times per year    Active Member of [employer] or Organizations: Yes    Attends Engineer, structural: More than 4 times per year    Marital Status: Married     Review of Systems: A  12 point ROS discussed and pertinent positives are indicated in the HPI above.  All other systems are negative.    Vital Signs: BP 132/73 (BP Location: Left Arm)   Pulse (!) 55   Temp 97.8 F (36.6 C) (Oral)   Resp 16   Ht 6' 2 (1.88 m)   Wt 254 lb 6.6 oz (115.4 kg)   SpO2 94%   BMI 32.66 kg/m     Physical Exam HENT:     Mouth/Throat:     Mouth: Mucous membranes are moist.     Pharynx: Oropharynx is clear.  Cardiovascular:     Rate and Rhythm: Regular rhythm. Bradycardia present.     Heart sounds: Normal heart sounds.  Pulmonary:     Effort: Pulmonary effort is normal.     Breath sounds: Normal breath sounds.  Abdominal:     Palpations: Abdomen is soft.     Tenderness: There is abdominal tenderness in the right upper quadrant.     Comments: Well healing surgical incisions, R abd JP drain present. LLQ brusing present near surgical incision.   Musculoskeletal:     Right lower leg: Edema present.     Left lower leg: Edema present.     Comments: Nonpitting edema bilaterally  Skin:    General: Skin is warm and dry.   Neurological:     General: No focal deficit present.     Mental Status: He is alert and oriented to person, place, and time.  Psychiatric:        Mood and Affect: Mood normal.        Behavior: Behavior normal.     Imaging: NM HEPATOBILIARY LEAK (POST-SURGICAL) Addendum Date: 06/12/2024 ADDENDUM REPORT: 06/12/2024 18:13 ADDENDUM: Further information was provided by the surgeon. Subtotal cholecystectomy performed. The contained activity in the region of the cystic duct is favored to represent activity in the gallbladder remnant following partial resection. That being the case, no evidence of biliary leak. Electronically Signed   By: Jackquline Boxer M.D.   On: 06/12/2024 18:13   Result Date: 06/12/2024 CLINICAL DATA:  Post surgery pain. Surgical drain in place. Cholecystectomy 06/11/2019 EXAM: NUCLEAR MEDICINE HEPATOBILIARY IMAGING TECHNIQUE: Sequential images of the abdomen were obtained out to 60 minutes following intravenous administration of radiopharmaceutical. RADIOPHARMACEUTICALS:  5.4 mCi Tc-89m  Choletec  IV COMPARISON:  CT 08/11/2024 FINDINGS: The surgical drain was clamped for the procedure. Prompt clearance radiotracer and homogeneous uptake in liver. Counts are evident in the common bile duct and small bowel by 30 minutes. At 60 minutes, a small focus of activity is noted in the region the gallbladder fossa. This activity increases in intensity over the next 30 minutes. The activity remains in the region of the cystic duct without disbursement.a IMPRESSION: 1. Concern for small contained bile leak at the level of the cystic duct remnant. 2. Patent common bile duct. Electronically Signed: By: Jackquline Boxer M.D. On: 06/12/2024 15:51   DG Chest Port 1 View Result Date: 06/11/2024 CLINICAL DATA:  Shortness of breath EXAM: PORTABLE CHEST 1 VIEW COMPARISON:  06/08/2024 CT FINDINGS: Cardiac shadow is stable. Lungs are well aerated bilaterally. Mild left retrocardiac atelectasis is seen. No  effusion is noted. No bony abnormality is seen. IMPRESSION: Mild left retrocardiac atelectasis. Electronically Signed   By: Oneil Devonshire M.D.   On: 06/11/2024 16:58   CT Angio Abd/Pel w/ and/or w/o Addendum Date: 06/11/2024 ADDENDUM REPORT: 06/11/2024 16:20 ADDENDUM: Critical Value/emergent results were called by telephone at the time of interpretation  on 06/11/2024 at 4:07 pm to provider [facility] , who verbally acknowledged these results. Electronically Signed   By: Leita Birmingham M.D.   On: 06/11/2024 16:20   Result Date: 06/11/2024 CLINICAL DATA:  Portal hypertension. Retroperitoneal bleed suspected. Possible venous bleeding. Recanalized umbilical vein. EXAM: CTA ABDOMEN AND PELVIS WITHOUT AND WITH CONTRAST TECHNIQUE: Multidetector CT imaging of the abdomen and pelvis was performed using the standard protocol during bolus administration of intravenous contrast. Multiplanar reconstructed images and MIPs were obtained and reviewed to evaluate the vascular anatomy. RADIATION DOSE REDUCTION: This exam was performed according to the departmental dose-optimization program which includes automated exposure control, adjustment of the mA and/or kV according to patient size and/or use of iterative reconstruction technique. CONTRAST:  OMNIPAQUE  IOHEXOL  350 MG/ML SOLN COMPARISON:  06/08/2024. FINDINGS: VASCULAR Aorta: Normal caliber aorta without aneurysm, dissection, vasculitis or significant stenosis. Aortic atherosclerosis. Celiac: Patent without evidence of aneurysm, dissection, vasculitis or significant stenosis. SMA: Patent without evidence of aneurysm, dissection, vasculitis or significant stenosis. Renals: Both renal arteries are patent without evidence of aneurysm, dissection, vasculitis, fibromuscular dysplasia. There is mild 2 moderate stenosis at the renal ostia bilaterally, unchanged. IMA: Patent without evidence of aneurysm, dissection, vasculitis or significant stenosis. Inflow: Patent without  evidence of aneurysm, dissection, vasculitis or significant stenosis. Proximal Outflow: Bilateral common femoral and visualized portions of the superficial and profunda femoral arteries are patent without evidence of aneurysm, dissection, vasculitis or significant stenosis. Veins: There is a large recanalized umbilical vein. Filling defects are identified in in the recannulized umbilical vein suggesting nonocclusive thrombus. No active contrast extravasation is seen. Review of the MIP images confirms the above findings. NON-VASCULAR Lower chest: The heart is enlarged and there is a trace pericardial effusion. Multi-vessel coronary artery calcifications are noted. There are trace bilateral pleural effusions with consolidation at the lung bases. There is partial visualization of pneumomediastinum. The ascending aorta is distended measuring 4.2 cm. Hepatobiliary: The liver has a lobular contour and enhances heterogeneously. There is an ill-defined 4.9 x 4.6 cm enhancing lesion in the left lobe of the liver, hepatic segment 4A. No biliary ductal dilatation. Previously described gallbladder stone is no longer seen. There is mild gallbladder wall thickening with surrounding fat stranding and perihepatic free fluid. Pancreas: Previously described lesions in the pancreas seen on MRI are not visualized on this exam. No pancreatic ductal dilatation or surrounding inflammatory changes. Spleen: Enlarged with multiple calcified granuloma. Adrenals/Urinary Tract: The adrenal glands are within normal limits. The kidneys enhance symmetrically. Renal cysts are noted bilaterally. No renal calculus or hydronephrosis. The bladder is unremarkable. Stomach/Bowel: There is a small hiatal hernia. The stomach is within normal limits. No bowel obstruction or pneumatosis is seen. A small amount of free air is present in the upper abdomen in dissecting into the anterior and left lateral abdominal wall and into the inguinal region on the left.  A few scattered diverticular present along the colon without evidence of diverticulitis. Appendix is not seen. Lymphatic: Prominent lymph nodes are noted at the porta hepatis and gastrohepatic ligament, likely reactive. Reproductive: Prostate is unremarkable. Other: Mesenteric edema and a small amount of ascites is present in the abdomen and pelvis. A drain enters the right lower quadrant and terminates in the right upper quadrant. No acute hemorrhage is seen. Musculoskeletal: Degenerative changes are present in the thoracolumbar spine. No acute osseous abnormality. IMPRESSION: VASCULAR 1. No evidence of active hemorrhage. 2. Recannulized umbilical vein with suspected filling defects at the level liver suggesting nonocclusive  thrombus. 3. 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 Recommend annual imaging followup by CTA or MRA. This recommendation follows 2010 ACCF/AHA/AATS/ACR/ASA/SCA/SCAI/SIR/STS/SVM Guidelines for the Diagnosis and Management of Patients with Thoracic Aortic Disease. Circulation. 2010; 121: Z733-z630. Aortic aneurysm NOS (ICD10-I71.9). 4. Remaining findings are unchanged. NON-VASCULAR 1. No evidence of acute hemorrhage. 2. Pneumomediastinum, small amount of free air in the upper abdomen, and air dissecting in the anterior and left lateral abdominal and pelvic wall which may be iatrogenic given interval placement of right upper quadrant drain. Correlate clinically to exclude the possibility viscus perforation. Chest radiograph is recommended to exclude the possibility of associated pneumothorax. 3. Small bilateral pleural effusions with basilar atelectasis or consolidation. 4. Morphologic changes of cirrhosis and portal hypertension. A large enhancing lesion is seen in the left lobe of the liver, previously characterized as possible hepatocellular carcinoma on recent MRI. 5. Mesenteric edema and mild anasarca. Electronically Signed: By: Leita Birmingham M.D. On: 06/11/2024 15:48   MR LIVER W WO CONTRAST Result Date: 06/09/2024 CLINICAL DATA:  Hypoechoic liver masses seen on US  and CT. EXAM: MRI ABDOMEN WITHOUT AND WITH CONTRAST TECHNIQUE: Multiplanar multisequence MR imaging of the abdomen was performed both before and after the administration of intravenous contrast. CONTRAST:  10mL GADAVIST  GADOBUTROL  1 MMOL/ML IV SOLN COMPARISON:  CT angiography chest, abdomen and pelvis from 06/08/2024. FINDINGS: Lower chest: There are linear areas of scarring/atelectasis in the visualized bilateral lungs. Otherwise unremarkable MR appearance to the lung bases. No pleural effusion. No pericardial effusion. Normal heart size. Hepatobiliary: There is cirrhotic liver morphology. There are innumerable mildly T2 hyperintense lesions throughout the liver occupying more than 25% of the total liver parenchyma. The lesions exhibit arterial hyperenhancement and washout on delayed/equilibrium phase images. Findings favor multifocal HCC. The left portal vein appears expanded with loss of signal void on T2 weighted images. However, there is no measurable contrast enhancement on the postcontrast images. Findings therefore favor acute probable bland thrombus. However, attention on follow-up examination is recommended. There is dilated and recanalized umbilical vein, suggesting sequela of portal hypertension. There is diminutive right portal vein however otherwise patent. The gallbladder is distended with width up to 5.9 cm. There is a stone in the gallbladder neck region. There is mild-to-moderate pericholecystic fat stranding and fluid, which is new since the prior study. In appropriate clinical setting, findings favor acute cholecystitis. No intra or extrahepatic bile duct dilation. No choledocholithiasis. Pancreas: There is a 7 x 10 mm T2 hyperintense nonenhancing structure in the pancreatic head. There are multiple additional subcentimeter sized T2 hyperintense nonenhancing  structures in the uncinate process, head, neck and body region of the pancreas (marked with electronic arrow sign on series 3). There is no direct communication of these lesions with main pancreatic duct. 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 Spleen: Moderately enlarged spleen measuring 10.2 x 16.2 cm orthogonally on coronal plane. No focal lesion. Adrenals/Urinary Tract: Unremarkable adrenal glands. No hydroureteronephrosis. There are multiple simple cysts throughout bilateral kidneys with largest arising from the right kidney laterally measuring up to 7.4 x 7.6 cm. Stomach/Bowel: There is a tiny sliding hiatal hernia. Visualized portions within the abdomen are unremarkable. No disproportionate dilation of bowel loops. Vascular/Lymphatic: No pathologically enlarged lymph nodes identified. No abdominal aortic aneurysm demonstrated. There is mild ascites mainly in the perihepatic and perisplenic region. No walled-off abscess seen. Other:  None. Musculoskeletal: No suspicious bone lesions identified. IMPRESSION: 1. Cirrhotic liver morphology  with innumerable lesions throughout the liver occupying more than 25% of the total liver parenchyma. Findings favor multifocal HCC. There is acute probable bland thrombus in the left portal vein. However, attention on follow-up examination is recommended. 2. There is a 7 x 10 mm T2 hyperintense nonenhancing structure in the pancreatic head. There are multiple additional subcentimeter sized T2 hyperintense nonenhancing structures in the uncinate process, head, neck and body region of the pancreas. 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 3. There is a stone in the gallbladder neck region. There is new, mild-to-moderate pericholecystic fat stranding and fluid. Findings favor acute cholecystitis. 4. There is mild ascites mainly in the perihepatic and perisplenic region. No  walled-off abscess seen. 5. Multiple other nonacute observations, as described above. Electronically Signed   By: Ree Molt M.D.   On: 06/09/2024 16:32   ECHOCARDIOGRAM COMPLETE Result Date: 06/09/2024    ECHOCARDIOGRAM REPORT   Patient Name:   MERVILLE HIJAZI [facility] Date of Exam: 06/09/2024 Medical Rec #:  [id number]       Height:       74.0 in Accession #:    [id number]      Weight:       244.0 lb Date of Birth:  11/09/1950       BSA:          2.365 m Patient Age:    73 years        BP:           102/56 mmHg Patient Gender: M               HR:           84 bpm. Exam Location:  [facility] Procedure: 2D Echo, Cardiac Doppler and Color Doppler (Both Spectral and Color            Flow Doppler were utilized during procedure). Indications:     Chest pain R07.9  History:         Patient has no prior history of Echocardiogram examinations.                  Previous Myocardial Infarction; Risk Factors:Diabetes and                  Hypertension.  Sonographer:     Christopher Furnace Referring Phys:  [id number] CORT ONEIDA MANA Diagnosing Phys: Cara JONETTA Lovelace MD IMPRESSIONS  1. Left ventricular ejection fraction, by estimation, is 60 to 65%. The left ventricle has normal function. The left ventricle has no regional wall motion abnormalities. There is moderate asymmetric left ventricular hypertrophy of the septal segment. Left ventricular diastolic parameters are consistent with Grade I diastolic dysfunction (impaired relaxation).  2. Right ventricular systolic function is normal. The right ventricular size is normal. Mildly increased right ventricular wall thickness.  3. The mitral valve is normal in structure. Trivial mitral valve regurgitation.  4. The aortic valve is normal in structure. Aortic valve regurgitation is trivial. FINDINGS  Left Ventricle: Left ventricular ejection fraction, by estimation, is 60 to 65%. The left ventricle has normal function. The left ventricle has no regional wall motion abnormalities. Strain was performed and the  global longitudinal strain is indeterminate. The left ventricular internal cavity size was small. There is moderate asymmetric left ventricular hypertrophy of the septal segment. Left ventricular diastolic parameters are consistent with Grade I diastolic dysfunction (impaired relaxation). Right Ventricle: The right ventricular size is normal. Mildly increased right ventricular wall thickness.  Right ventricular systolic function is normal. Left Atrium: Left atrial size was normal in size. Right Atrium: Right atrial size was normal in size. Pericardium: There is no evidence of pericardial effusion. Mitral Valve: The mitral valve is normal in structure. Trivial mitral valve regurgitation. MV peak gradient, 4.8 mmHg. The mean mitral valve gradient is 2.0 mmHg. Tricuspid Valve: The tricuspid valve is normal in structure. Tricuspid valve regurgitation is mild. Aortic Valve: The aortic valve is normal in structure. Aortic valve regurgitation is trivial. Aortic valve mean gradient measures 5.0 mmHg. Aortic valve peak gradient measures 8.5 mmHg. Aortic valve area, by VTI measures 5.01 cm. Pulmonic Valve: The pulmonic valve was normal in structure. Pulmonic valve regurgitation is not visualized. Aorta: The ascending aorta was not well visualized. IAS/Shunts: No atrial level shunt detected by color flow Doppler. Additional Comments: 3D was performed not requiring image post processing on an independent workstation and was indeterminate.  LEFT VENTRICLE PLAX 2D LVIDd:         4.20 cm   Diastology LVIDs:         2.80 cm   LV e' medial:    6.42 cm/s LV PW:         1.20 cm   LV E/e' medial:  8.8 LV IVS:        1.90 cm   LV e' lateral:   8.92 cm/s LVOT diam:     2.30 cm   LV E/e' lateral: 6.3 LV SV:         120 LV SV Index:   51 LVOT Area:     4.15 cm  RIGHT VENTRICLE RV Basal diam:  3.30 cm RV Mid diam:    2.60 cm LEFT ATRIUM             Index        RIGHT ATRIUM           Index LA diam:        4.10 cm 1.73 cm/m   RA Area:      10.70 cm LA Vol (A2C):   45.5 ml 19.24 ml/m  RA Volume:   19.10 ml  8.08 ml/m LA Vol (A4C):   27.7 ml 11.71 ml/m LA Biplane Vol: 38.4 ml 16.24 ml/m  AORTIC VALVE AV Area (Vmax):    4.10 cm AV Area (Vmean):   4.10 cm AV Area (VTI):     5.01 cm AV Vmax:           146.00 cm/s AV Vmean:          107.500 cm/s AV VTI:            0.240 m AV Peak Grad:      8.5 mmHg AV Mean Grad:      5.0 mmHg LVOT Vmax:         144.00 cm/s LVOT Vmean:        106.000 cm/s LVOT VTI:          0.290 m LVOT/AV VTI ratio: 1.21  AORTA Ao Root diam: 3.60 cm MITRAL VALVE               TRICUSPID VALVE MV Area (PHT): 2.10 cm    TR Peak grad:   9.1 mmHg MV Area VTI:   4.56 cm    TR Vmax:        151.00 cm/s MV Peak grad:  4.8 mmHg MV Mean grad:  2.0 mmHg    SHUNTS MV Vmax:  1.09 m/s    Systemic VTI:  0.29 m MV Vmean:      59.8 cm/s   Systemic Diam: 2.30 cm MV Decel Time: 361 msec MV E velocity: 56.60 cm/s MV A velocity: 87.00 cm/s MV E/A ratio:  0.65 Dwayne D Callwood MD Electronically signed by Cara JONETTA Lovelace MD Signature Date/Time: 06/09/2024/1:47:37 PM    Final    US  ABDOMEN LIMITED WITH LIVER DOPPLER Result Date: 06/08/2024 CLINICAL DATA:  Abdominal pain, jaundice EXAM: DUPLEX ULTRASOUND OF LIVER TECHNIQUE: Color and duplex Doppler ultrasound was performed to evaluate the hepatic in-flow and out-flow vessels. COMPARISON:  06/08/2024, 02/17/2022 FINDINGS: Liver: Liver displays diffusely heterogeneous liver echotexture, with multiple hyperechoic masses throughout the liver parenchyma. Index mass within the anterior right lobe liver measures 2.5 x 2.3 x 2.4 cm. Given ultrasound and recent CT findings, further evaluation with dedicated liver CT or MRI is recommended. Liver is enlarged measuring 17.6 cm in the midclavicular line. The gallbladder is moderately distended, with 2.2 cm shadowing gallstone within the gallbladder neck. Gallbladder wall measures up to 4 mm in maximal thickness. Main Portal Vein size: 1.7 cm Portal Vein  Velocities Main Prox:  11.9 cm/sec Main Mid: Occluded Main Dist: Occluded Right: Occluded Left: Occluded Hepatic Vein Velocities Right:  19.5 cm/sec Middle:  19.3 cm/sec Left:  24.7 cm/sec Normal directional flow, though waveforms are pulsatile and turbulent. IVC: Present and patent with normal respiratory phasicity. Hepatic Artery Velocity:  41.3 cm/sec Splenic Vein Velocity:  12.2 cm/sec Spleen: 16.5 cm x 8.5 cm x 17.8 cm with a total volume of 1306.9 cm^3 (411 cm^3 is upper limit normal) Portal Vein Occlusion/Thrombus: Yes, occlusion Splenic Vein Occlusion/Thrombus: No Ascites: None Varices: Large recanalized umbilical vein is noted, with echogenic thrombus near the liver. IMPRESSION: 1. Heterogeneous liver echotexture with multiple hyperechoic masses as above. Further evaluation with dedicated liver MRI is recommended when clinical situation permits. 2. Portal vein occlusion as above, with only the proximal aspect of the main portal vein demonstrating patency on Doppler evaluation. 3. Large recanalized umbilical vein, with echogenic thrombus near the liver. 4. Marked splenomegaly. Electronically Signed   By: Ozell Daring M.D.   On: 06/08/2024 16:08   CT Angio Chest/Abd/Pel for Dissection W and/or Wo Contrast Result Date: 06/08/2024 EXAM: CTA CHEST, ABDOMEN AND PELVIS WITH AND WITHOUT CONTRAST 06/08/2024 08:19:56 AM TECHNIQUE: CTA of the chest was performed with and without the administration of intravenous contrast. CTA of the abdomen and pelvis was performed with and without the administration of intravenous contrast. Multiplanar reformatted images are provided for review. MIP images are provided for review. Automated exposure control, iterative reconstruction, and/or weight based adjustment of the mA/kV was utilized to reduce the radiation dose to as low as reasonably achievable. COMPARISON: None available. CLINICAL HISTORY: Acute aortic syndrome (AAS) suspected. Patient ambulatory to triage with steady  gait, without difficulty or distress noted; pt reports awakening at 3am with generalized abd pain accomp by N/V; denies hx of same; Hx of heart stents. 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 There is moderate calcific plaque present throughout the thoracic aorta. The descending thoracic aorta is tortuous. The proximal descending thoracic aorta measures approximately 3.5 cm in diameter and the distal descending thoracic aorta approximately 3.1 cm in diameter. There is no evidence of acute aortic syndrome. PULMONARY ARTERIES: No pulmonary embolism with the limits of this exam.  GREAT VESSELS OF AORTIC ARCH: No acute finding. No dissection. No arterial occlusion or significant stenosis. CELIAC TRUNK: No acute finding. No occlusion or significant stenosis. SUPERIOR MESENTERIC ARTERY: No acute finding. No occlusion or significant stenosis. The mesenteric arteries are widely patent. INFERIOR MESENTERIC ARTERY: No acute finding. No occlusion or significant stenosis. RENAL ARTERIES: There is mild stenosis of the origins of the renal arteries bilaterally, worse on the right, which is approximately 50% stenotic. 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 CHEST: MEDIASTINUM: The heart is mildly enlarged and there is moderate to severe calcific coronary artery disease. LUNGS AND PLEURA: There are ground-glass and reticular opacities present dependently within the lungs bilaterally. The nondependent lungs are clear. No evidence of pleural effusion or pneumothorax. THORACIC BONES AND SOFT TISSUES: No acute bone or soft tissue abnormality. ABDOMEN AND PELVIS: LIVER: There is heterogeneous enhancement of the liver and cirrhotic morphology. Hepatic lesions cannot be excluded given the  heterogeneous attenuation. There is recanalization of the umbilical vein. GALLBLADDER AND BILE DUCTS: The gallbladder is moderately distended, measuring greater than 13 cm in length and greater than 5 cm in transverse dimension. There is a calcified stone layering dependently within the gallbladder. There is no definite evidence of cholecystitis. SPLEEN: There is moderate splenomegaly. The spleen measures approximately 16 cm in length. PANCREAS: The pancreas is unremarkable. ADRENAL GLANDS: Bilateral adrenal glands demonstrate no acute abnormality. KIDNEYS, URETERS AND BLADDER: There are simple renal cysts present bilaterally, including an exophytic cyst on the right measuring nearly 8 cm in diameter. No follow up of the cysts is necessary. No stones in the kidneys or ureters. No hydronephrosis. No perinephric or periureteral stranding. Urinary bladder is unremarkable. GI AND BOWEL: There is a small sliding hiatus hernia. There are few sigmoid diverticula, but no evidence of diverticulitis. Stomach and duodenal sweep demonstrate no acute abnormality. There is no bowel obstruction. No abnormal bowel wall thickening or distension. REPRODUCTIVE: Reproductive organs are unremarkable. PERITONEUM AND RETROPERITONEUM: No ascites or free air. LYMPH NODES: No lymphadenopathy. ABDOMINAL BONES AND SOFT TISSUES: No acute abnormality of the bones. No acute soft tissue abnormality. IMPRESSION: 1. No evidence of acute aortic syndrome. 2. 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 3. Moderate calcific plaque throughout the thoracic aorta. 4. Mild stenosis of the origins of the renal arteries bilaterally, worse on the right (approximately 50% stenotic). 5. Heterogeneous enhancement of the liver with cirrhotic morphology. Hepatic lesions cannot be excluded. Follow up MRI of the  liver without and with gadolinium contrast is suggested. 6. Moderate splenomegaly, measuring approximately 16 cm in length. 7. Small sliding hiatus hernia. Electronically signed by: evalene coho 06/08/2024 09:19 AM EDT RP Workstation: [id number]    Labs:  CBC: Recent Labs    06/12/24 0420 06/12/24 1558 06/13/24 0526 06/14/24 0614  WBC 27.6* 27.6* 20.9* 21.4*  HGB 13.2 13.7 13.5 14.5  HCT 37.5* 39.5 39.4 42.4  PLT 132* 133* 131* 136*    COAGS: Recent Labs    06/08/24 1328 06/11/24 1641 06/12/24 0420 06/14/24 0614  INR 1.1 1.3* 1.3* 1.2  APTT  --   --   --  33    BMP: Recent Labs    06/11/24 0440 06/12/24 0420 06/13/24 0526 06/14/24 0614  NA 131* 136 140 140  K 4.4 4.2 4.0  3.6  CL 104 109 113* 109  CO2 21* 21* 22 22  GLUCOSE 230* 144* 114* 130*  BUN 41* 32* 28* 23  CALCIUM  7.6* 8.1* 8.4* 8.3*  CREATININE 1.24 1.00 0.96 1.00  GFRNONAA >60 >60 >60 >60    LIVER FUNCTION TESTS: Recent Labs    06/09/24 0323 06/11/24 0440 06/12/24 0420 06/14/24 0614  BILITOT 2.3* 1.1 1.1 1.3*  AST 93* 70* 73* 64*  ALT 91* 71* 79* 79*  ALKPHOS 86 68 71 73  PROT 5.7* 5.2* 5.2* 5.0*  ALBUMIN 2.7* 2.1* 2.2* 2.0*    TUMOR MARKERS: No results for input(s): AFPTM, CEA, CA199, CHROMGRNA in the last 8760 hours.  Assessment and Plan:  Request for  image guided liver lesion biopsy approved for 8/13 No contraindications for procedure identified in ROS, physical exam, or review of pre-sedation considerations. Labs reviewed and within acceptable range 8/8 MR abd imaging available and reviewed VSS (baseline bradycardia), afebrile Patient does not take a blood thinner  Risks and benefits of liver lesion biopsy was discussed with the patient and/or patient's family including, but not limited to bleeding, infection, damage to adjacent structures or low yield requiring additional tests.  All of the questions were answered and there is agreement to proceed.  Consent signed  and in chart.   Thank you for allowing our service to participate in Jobani Sabado 's care.    Electronically Signed: Laymon Coast, NP   06/14/2024, 11:11 AM     I spent a total of 20 Minutes    in face to face in clinical consultation, greater than 50% of which was counseling/coordinating care for liver lesion biopsy.   (A copy of this note was sent to the referring provider and the time of visit.)

## 2024-06-14 NOTE — Procedures (Signed)
 Interventional Radiology Procedure Note  Procedure: US  guided core biopsy of liver lesion  Complications: None  Estimated Blood Loss: None  Recommendations: - Bedrest x 2 hrs - May resume heparin  G++ in 2 hrs   Signed,  Wilkie LOIS Lent, MD

## 2024-06-14 NOTE — Progress Notes (Addendum)
 Progress Note   Patient: Nicholas Abbott FMW:[id number] DOB: January 12, 1951 DOA: 06/08/2024     5 DOS: the patient was seen and examined on 06/14/2024   Brief hospital course: 73yo with h/o CAD s/p stenting, HTN, HLD, OSA on CPAP, cirrhosis with chronic thrombocytopenia, and class 1 obesity who presented on 8/7 with abdominal pain.  Found to have acute cholecystitis, now s/p lap chole with drain placement.  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  Oncology is also following.  Assessment and Plan:  Sepsis due to acute cholecystitis Sepsis physiology met with leukocytosis, fever on presentation; physiology has resolved although he remains with tachycardia Suspected source is infected stone within the gallbladder neck and resultant cholecystitis Status post laparoscopic cholecystectomy on 8/9 JP drain left in place and continues to have copious brown drainage HIDA scan with contained bile leak, likely secondary to the remnant cystic duct portion GI was consulted and is planning for ERCP on 8/14, possible need for stent placement to facilitate drainage and stop bile leak Will stop Ceftriaxone  after 8/14 dose, as he has completed 7 days of therapy   Multiple liver and pancreatic lesions Noted on dedicated liver MRI Liver lesions concerning for HCC in the background of cirrhosis Pancreatic lesions suspicious for IPMN AFP and CA 19-9 elevated General surgery was not able to safely perform intraoperative liver biopsy IR liver biopsy performed on 8/13 Oncology is also consulting  Portal venous thrombosis Started on heparin  today but this will be held until after ERCP on 8/14 He may be able to start treatment-dose Lovenox  vs. Heparin  vs. DOAC at that time Will place a pharmacy consult to assess DOAC pricing   Liver cirrhosis with chronic transaminitis/hyperbilirubinemia and chronic thrombocytopenia Unknown etiology, alcoholic?,  NASH? Autoimune? Does not appear to have  had a significant ETOH use d/o in his lifetime based on today's questioning Appears to be stable, no significant impairment of synthetic function of liver with normal albumin level and INR   CAD Holding aspirin  Plavix  Will likely transition to DOAC therapy   HTN Continue hydrochlorothiazide /lisinopril    Sinus bradycardia Frequent PVCs Compared to his recent office record, baseline heart rate in 40-60s and blood pressure stable As per recommendation from [facility]ology at [facility], will continue low-dose metoprolol   Class 1 obesity Body mass index is 32.66 kg/m.SABRA  Weight loss should be encouraged Outpatient PCP/bariatric medicine f/u encouraged Significantly low or high BMI is associated with higher medical risk including morbidity and mortality       Consultants: Cardiology Surgery Oncology  Procedures: Echocardiogram 8/8 Laparoscopic cholecystectomy 8/9 US -guided Liver biopsy (IR) 8/13 ERCP 8/14  Antibiotics: Ceftriaxone  8/8-14  30 Day Unplanned Readmission Risk Score    Flowsheet Row ED to Hosp-Admission (Current) from 06/08/2024 in [facility]  30 Day Unplanned Readmission Risk Score (%) 14.87 Filed at 06/14/2024 0400    This score is the patient's risk of an unplanned readmission within 30 days of being discharged (0 -100%). The score is based on dignosis, age, lab data, medications, orders, and past utilization.   Low:  0-14.9   Medium: 15-21.9   High: 22-29.9   Extreme: 30 and above           Subjective: Feeling ok.  Copious drainage from JP drain.  Abdominal pain is negligible and controlled.     Objective: Vitals:   06/14/24 1615 06/14/24 1630  BP: 138/81 137/82  Pulse: 63 63  Resp: 15 15  Temp:  (!)  97.4 F (36.3 C)  SpO2: 92% 92%    Intake/Output Summary (Last 24 hours) at 06/14/2024 1709 Last data filed at 06/14/2024 1600 Gross per 24 hour  Intake 295.74 ml  Output 1730 ml  Net -1434.26 ml   Filed Weights    06/08/24 0636 06/11/24 1500  Weight: 110.7 kg 115.4 kg    Exam:  General:  Appears calm and comfortable and is in NAD Eyes:  normal lids, iris ENT:  grossly normal hearing, lips & tongue, mmm Cardiovascular:  RRR. No LE edema.  Respiratory:   CTA bilaterally with no wheezes/rales/rhonchi.  Normal respiratory effort. Abdomen:  soft, NT, ND; surgical wounds healing nicely; R-sided JP drain in place Skin:  no rash or induration seen on limited exam; LLQ abdominal ecchymosis noted Musculoskeletal:  grossly normal tone BUE/BLE, good ROM, no bony abnormality Psychiatric:  grossly normal mood and affect, speech fluent and appropriate, AOx3 Neurologic:  CN 2-12 grossly intact, moves all extremities in coordinated fashion  Data Reviewed: I have reviewed the patient's lab results since admission.  Pertinent labs for today include:   Glucose 130 Albumin 2.0 AST 64/ALT 79/Bili 1.3, stable WBC 21.4 Platelets 136 INR 1.2    Family Communication: Wife was present  Disposition: Status is: Inpatient Remains inpatient appropriate because: ongoing management     Time spent: 50 minutes  Unresulted Labs (From admission, onward)     Start     Ordered   06/15/24 0500  CBC  Tomorrow morning,   R       Question:  Specimen collection method  Answer:  Lab=Lab collect   06/14/24 0902   06/15/24 0500  Magnesium  Tomorrow morning,   R       Question:  Specimen collection method  Answer:  Lab=Lab collect   06/14/24 1209   06/15/24 0500  Phosphorus  Tomorrow morning,   R       Question:  Specimen collection method  Answer:  Lab=Lab collect   06/14/24 1209   06/15/24 0500  Comprehensive metabolic panel with GFR  Tomorrow morning,   R       Question:  Specimen collection method  Answer:  Lab=Lab collect   06/14/24 1705             Author: Delon Herald, MD 06/14/2024 5:09 PM  For on call review [email or web address].

## 2024-06-14 NOTE — Plan of Care (Signed)

## 2024-06-14 NOTE — Progress Notes (Signed)
 [facility] SURGICAL PROGRESS NOTE  Hospital Day(s): 5.   Post op day(s): 4 Days Post-Op.   Interval History: Patient seen and examined, no acute events or new complaints overnight.  Patient now has consistent high output from the Jackson-Pratt left in the region of the gallbladder fossa, now what appears more bilious perhaps diluted with ascitic fluid? .   Patient reports tolerance of diet, good pain control.  Had some back pain when not keeping up charging the bulb.   Review of Systems:  Constitutional: denies fever, chills  Respiratory: denies any shortness of breath  Cardiovascular: denies chest pain or palpitations  Gastrointestinal: denies N/V, or diarrhea/and bowel function as per interval history Musculoskeletal: denies pain, decreased motor or sensation Integumentary: denies any other rashes or skin discolorations  Vital signs in last 24 hours: [min-max] current  Temp:  [97.7 F (36.5 C)-98.9 F (37.2 C)] 98.1 F (36.7 C) (08/13 0357) Pulse Rate:  [28-67] 54 (08/13 0357) Resp:  [16-21] 18 (08/13 0357) BP: (109-159)/(75-91) 134/83 (08/13 0357) SpO2:  [92 %-99 %] 95 % (08/13 0357) FiO2 (%):  [21 %] 21 % (08/12 1952)     Height: 6' 2 (188 cm) Weight: 115.4 kg BMI (Calculated): 32.65   Intake/Output last 2 shifts:  08/12 0701 - 08/13 0700 In: 655.7 [P.O.:360; IV Piggyback:295.7] Out: 1535 [Urine:400; Drains:1135]   Physical Exam:  Constitutional: alert, cooperative and no distress  Respiratory: breathing non-labored at rest  Cardiovascular: regular rate and rhythm  Gastrointestinal: soft, non-tender, and non-distended Integumentary: Skin incisions are clean dry and intact.  Right lower quadrant JP drain with a full bulb consistent with serous/bile.  Labs:     Latest Ref Rng & Units 06/14/2024    6:14 AM 06/13/2024    5:26 AM 06/12/2024    3:58 PM  CBC  WBC 4.0 - 10.5 K/uL 21.4  20.9  27.6   Hemoglobin 13.0 - 17.0 g/dL 85.4  86.4  86.2   Hematocrit  39.0 - 52.0 % 42.4  39.4  39.5   Platelets 150 - 400 K/uL 136  131  133       Latest Ref Rng & Units 06/14/2024    6:14 AM 06/13/2024    5:26 AM 06/12/2024    4:20 AM  CMP  Glucose 70 - 99 mg/dL 869  885  855   BUN 8 - 23 mg/dL 23  28  32   Creatinine 0.61 - 1.24 mg/dL 8.99  9.03  8.99   Sodium 135 - 145 mmol/L 140  140  136   Potassium 3.5 - 5.1 mmol/L 3.6  4.0  4.2   Chloride 98 - 111 mmol/L 109  113  109   CO2 22 - 32 mmol/L 22  22  21    Calcium  8.9 - 10.3 mg/dL 8.3  8.4  8.1   Total Protein 6.5 - 8.1 g/dL 5.0   5.2   Total Bilirubin 0.0 - 1.2 mg/dL 1.3   1.1   Alkaline Phos 38 - 126 U/L 73   71   AST 15 - 41 U/L 64   73   ALT 0 - 44 U/L 79   79      Imaging studies: No new pertinent imaging studies   Assessment/Plan:  73 y.o. male with  4 Days Post-Op s/p robotic subtotal cholecystectomy with reconstitution for acute calculus cholecystitis, complicated by pertinent comorbidities including:   Patient Active Problem List   Diagnosis Date Noted   Calculus of gallbladder  with acute cholecystitis without obstruction 06/10/2024   Cirrhosis of liver with ascites (HCC) 06/10/2024   Liver masses 06/10/2024   Abdominal pain 06/08/2024   Closed nondisplaced fracture of fifth cervical vertebra with routine healing 10/25/2023   Pain in joint of right shoulder 10/25/2023   OSA on CPAP 08/30/2023   CPAP use counseling 08/30/2023   S/P angioplasty with stent 05/01/2022   GERD (gastroesophageal reflux disease) 04/28/2021   Sleep apnea 04/28/2021   Hyperlipidemia 04/28/2021   Hyperlipidemia    Hypertension    Diabetes mellitus without complication (HCC)    CAD (coronary artery disease)    Thrombocytopenia (HCC)    Acute metabolic encephalopathy    Hypoglycemia    Type 2 diabetes mellitus without complication, without long-term current use of insulin  (HCC) 02/11/2017   Combined arterial insufficiency and corporo-venous occlusive erectile dysfunction 02/10/2017   Coronary artery  disease 06/06/2014   Hypertension 06/06/2014   Obesity 05/30/2012   Myocardial infarction (HCC) 03/02/2000    - Bleeding site not identified on CTA, seems not an issue now   - Continue to keep up with recharging bulb for Blake drain.  - Back on M/S floor, despite need for maintenance of drain.  - Residual GB reconstitution on HIDA, ? has leaked?  Hi output supicious, but may be ascitic in addition... will d/w Dr. Jinny.  ERCP?/stent?   - Despite some acute thrombus in left portal vein, would not entertain anticoagulation at this point.  - May proceed percutaneous liver biopsy today. .  All of the above findings and recommendations were discussed with the patient, and all of patient's questions were answered to their expressed satisfaction.  -- [name], M.D., [facility] 06/14/2024

## 2024-06-14 NOTE — Consult Note (Signed)
 Rogelia Copping, MD [facility]  [address], [address] [place], [place] [address] Phone: [phone] Fax : [phone]  Consultation  Referring Provider:     Dr. Lane Primary Care Physician:  Sherial Bail, MD Primary Gastroenterologist:  Dr. Aundria         Reason for Consultation:     Bile duct leak  Date of Admission:  06/08/2024 Date of Consultation:  06/14/2024         HPI:   Nicholas Abbott is a 73 y.o. male is status post subtotal cholecystectomy with continued drainage out of the percutaneous drain.  The patient has a history of being admitted on the seventh of this month for abdominal pain.  The patient has a past medical history of GERD hypertension diabetes and nonalcoholic cirrhosis.  The patient had an EGD and colonoscopy with Dr. Aundria back in 2021. When the subhepatic drain was first put in the drainage was blood-tinged and now has turned into more of a bilious drainage.  The patient had a hepatobiliary scan that showed:  ADDENDUM REPORT: 06/12/2024 18:13   ADDENDUM: Further information was provided by the surgeon. Subtotal cholecystectomy performed. The contained activity in the region of the cystic duct is favored to represent activity in the gallbladder remnant following partial resection. That being the case, no evidence of biliary leak.     The patient had a CT angiography that showed:  IMPRESSION: VASCULAR   1. No evidence of active hemorrhage. 2. Recannulized umbilical vein with suspected filling defects at the level liver suggesting nonocclusive thrombus. 3. 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 Recommend annual imaging followup by CTA or MRA. This recommendation follows 2010 ACCF/AHA/AATS/ACR/ASA/SCA/SCAI/SIR/STS/SVM Guidelines for the Diagnosis and Management of Patients with Thoracic Aortic Disease. Circulation. 2010; 121: Z733-z630. Aortic aneurysm NOS (ICD10-I71.9). 4. Remaining  findings are unchanged.   NON-VASCULAR   1. No evidence of acute hemorrhage. 2. Pneumomediastinum, small amount of free air in the upper abdomen, and air dissecting in the anterior and left lateral abdominal and pelvic wall which may be iatrogenic given interval placement of right upper quadrant drain. Correlate clinically to exclude the possibility viscus perforation. Chest radiograph is recommended to exclude the possibility of associated pneumothorax. 3. Small bilateral pleural effusions with basilar atelectasis or consolidation. 4. Morphologic changes of cirrhosis and portal hypertension. A large enhancing lesion is seen in the left lobe of the liver, previously characterized as possible hepatocellular carcinoma on recent MRI. 5. Mesenteric edema and mild anasarca.  Patient was started on heparin  due to the thrombus.  The heparin  has been held because of the patient going for a biopsy of the liver lesions.   Past Medical History:  Diagnosis Date   Basal cell carcinoma    L ear, txted in past   Diabetes mellitus without complication (HCC)    GERD (gastroesophageal reflux disease)    Hyperlipidemia    Hypertension    Left shoulder pain    Myocardial infarction [facility])     Past Surgical History:  Procedure Laterality Date   CATARACT EXTRACTION     COLONOSCOPY WITH PROPOFOL  N/A 01/01/2020   Procedure: COLONOSCOPY WITH PROPOFOL ;  Surgeon: [name], [name], MD;  Location: [facility] ENDOSCOPY;  Service: Gastroenterology;  Laterality: N/A;   CORONARY ANGIOPLASTY     ESOPHAGOGASTRODUODENOSCOPY (EGD) WITH PROPOFOL  N/A 01/01/2020   Procedure: ESOPHAGOGASTRODUODENOSCOPY (EGD) WITH PROPOFOL ;  Surgeon: [name], [name], MD;  Location: [facility] ENDOSCOPY;  Service: Gastroenterology;  Laterality: N/A;   EYE SURGERY     LEFT HEART CATH AND CORONARY ANGIOGRAPHY N/A 04/29/2022   Procedure: LEFT HEART CATH AND CORONARY ANGIOGRAPHY;  Surgeon: [name], MD;  Location: [facility] INVASIVE CV LAB;   Service: Cardiovascular;  Laterality: N/A;   RETINAL DETACHMENT SURGERY      Prior to Admission medications   Medication Sig Start Date End Date Taking? Authorizing Provider  acetaminophen  (TYLENOL ) 500 MG tablet Take 1,000 mg by mouth every 8 (eight) hours as needed.   Yes [provider]  aspirin  EC 81 MG tablet Take 81 mg by mouth daily.   Yes [provider]  Cinnamon 500 MG capsule Take 1,000 mg by mouth daily.   Yes [provider]  Cyanocobalamin  (VITAMIN B-12) 5000 MCG TBDP Take 5,000 mcg by mouth daily.    Yes [provider]  empagliflozin  (JARDIANCE ) 10 MG TABS tablet Take 1 tablet by mouth daily with breakfast. 02/16/22  Yes [provider]  ezetimibe (ZETIA) 10 MG tablet Take 10 mg by mouth daily. 04/06/24 04/06/25 Yes [provider]  folic acid -pyridoxine -cyancobalamin (FOLTX) 2.5-25-2 MG TABS tablet Take 1 tablet by mouth daily. 02/08/17  Yes [provider]  hydrocortisone  2.5 % cream APPLY TO THE GROIN DAILY ON MONDAY, WEDNESDAY, AND FRIDAY. 01/27/23  Yes [name], MD  ketoconazole  (NIZORAL ) 2 % cream APPLY TO THE GROIN AT BEDTIME 12/20/23  Yes [name], MD  levothyroxine  (SYNTHROID ) 112 MCG tablet Take 112 mcg by mouth daily. 02/20/22  Yes [provider]  liothyronine  (CYTOMEL ) 5 MCG tablet Take 5 mcg by mouth every morning.   Yes [provider]  lisinopril -hydrochlorothiazide  (ZESTORETIC ) 20-12.5 MG tablet Take 1 tablet by mouth daily. 04/06/23 06/08/24 Yes [provider]  metoprolol  succinate (TOPROL -XL) 25 MG 24 hr tablet Take 0.5 tablets (12.5 mg total) by mouth daily. 04/29/22 06/08/24 Yes [name], MD  nitroGLYCERIN  (NITROSTAT ) 0.4 MG SL tablet Place 0.4 mg under the tongue every 5 (five) minutes as needed for chest pain. 05/01/22  Yes [provider]  rosuvastatin  (CRESTOR ) 40 MG tablet Take by mouth. 04/23/22  Yes [provider]  tadalafil (CIALIS)  5 MG tablet Take 5 mg by mouth daily as needed for erectile dysfunction. 08/31/22  Yes [provider]  traMADol  (ULTRAM ) 50 MG tablet Take 50 mg by mouth daily as needed for severe pain or moderate pain. 02/05/21  Yes [provider]  Blood Glucose Monitoring Suppl DEVI 1 each by Does not apply route in the morning, at noon, and at bedtime. May substitute to any manufacturer covered by patient's insurance. 01/22/23   [name], MD  clopidogrel  (PLAVIX ) 75 MG tablet Take 75 mg by mouth daily. Patient not taking: Reported on 06/08/2024    [provider]  PHOSPHATIDYLSERINE PO Take by mouth.    [provider]    Family History  Problem Relation Age of Onset   Leukemia Father      Social History   Tobacco Use   Smoking status: Never   Smokeless tobacco: Never  Vaping Use   Vaping status: Never Used  Substance Use Topics   Alcohol use: Yes    Alcohol/week: 5.0 standard drinks of alcohol    Types: 5 Shots of liquor per week   Drug use: Never    Allergies as of 06/08/2024 - Review Complete 06/08/2024  Allergen Reaction Noted   Pioglitazone Rash 01/18/2019    Review of Systems:  All systems reviewed and negative except where noted in HPI.   Physical Exam:  Vital signs in last 24 hours: Temp:  [97.8 F (36.6 C)-98.4 F (36.9 C)] 97.8 F (36.6 C) (08/13 0751) Pulse Rate:  [54-55] 55 (08/13 0751) Resp:  [16-18] 16 (08/13 0751) BP: (132-136)/(73-83) 132/73 (08/13 0751) SpO2:  [92 %-95 %] 94 % (08/13 0751) FiO2 (%):  [21 %] 21 % (08/12 1952) Last BM Date : 06/07/24 General:   Pleasant, cooperative in NAD Head:  Normocephalic and atraumatic. Eyes:   No icterus.   Conjunctiva pink. PERRLA. Ears:  Normal auditory acuity. Neck:  Supple; no masses or thyroidomegaly Lungs: Respirations even and unlabored. Lungs clear to auscultation bilaterally.   No wheezes, crackles, or rhonchi.  Heart:  Regular rate and rhythm;  Without murmur, clicks,  rubs or gallops Abdomen:  Soft, nondistended, nontender. Normal bowel sounds. No appreciable masses or hepatomegaly.  No rebound or guarding.  Positive drain in the right lower quadrant. Rectal:  Not performed. Msk:  Symmetrical without gross deformities.    Extremities:  Without edema, cyanosis or clubbing. Neurologic:  Alert and oriented x3;  grossly normal neurologically. Skin:  Intact without significant lesions or rashes. Cervical Nodes:  No significant cervical adenopathy. Psych:  Alert and cooperative. Normal affect.  LAB RESULTS: Recent Labs    06/12/24 1558 06/13/24 0526 06/14/24 0614  WBC 27.6* 20.9* 21.4*  HGB 13.7 13.5 14.5  HCT 39.5 39.4 42.4  PLT 133* 131* 136*   BMET Recent Labs    06/12/24 0420 06/13/24 0526 06/14/24 0614  NA 136 140 140  K 4.2 4.0 3.6  CL 109 113* 109  CO2 21* 22 22  GLUCOSE 144* 114* 130*  BUN 32* 28* 23  CREATININE 1.00 0.96 1.00  CALCIUM  8.1* 8.4* 8.3*   LFT Recent Labs    06/14/24 0614  PROT 5.0*  ALBUMIN 2.0*  AST 64*  ALT 79*  ALKPHOS 73  BILITOT 1.3*   PT/INR Recent Labs    06/12/24 0420 06/14/24 0614  LABPROT 16.7* 15.9*  INR 1.3* 1.2    STUDIES: NM HEPATOBILIARY LEAK (POST-SURGICAL) Addendum Date: 06/12/2024 ADDENDUM REPORT: 06/12/2024 18:13 ADDENDUM: Further information was provided by the surgeon. Subtotal cholecystectomy performed. The contained activity in the region of the cystic duct is favored to represent activity in the gallbladder remnant following partial resection. That being the case, no evidence of biliary leak. Electronically Signed   By: Jackquline Boxer M.D.   On: 06/12/2024 18:13   Result Date: 06/12/2024 CLINICAL DATA:  Post surgery pain. Surgical drain in place. Cholecystectomy 06/11/2019 EXAM: NUCLEAR MEDICINE HEPATOBILIARY IMAGING TECHNIQUE: Sequential images of the abdomen were obtained out to 60 minutes following intravenous administration of radiopharmaceutical. RADIOPHARMACEUTICALS:  5.4  mCi Tc-16m  Choletec  IV COMPARISON:  CT 08/11/2024 FINDINGS: The surgical drain was clamped for the procedure. Prompt clearance radiotracer and homogeneous uptake in liver. Counts are evident in the common bile duct and small bowel by 30 minutes. At 60 minutes, a small focus of activity is noted in the region the gallbladder fossa. This activity increases in intensity over the next 30 minutes. The activity remains in the region of the cystic duct without disbursement.a IMPRESSION: 1. Concern for small contained bile leak at the level of the cystic duct remnant. 2. Patent common bile duct. Electronically Signed: By: Jackquline Boxer M.D. On: 06/12/2024 15:51      Impression / Plan:   Assessment: Principal Problem:   Abdominal pain Active Problems:  Calculus of gallbladder with acute cholecystitis without obstruction   Cirrhosis of liver with ascites [facility])   Liver masses   Nicholas Abbott is a 73 y.o. y/o male with a subtotal cholecystectomy with continued drainage from his percutaneous drain.  I am now being asked to see the patient for possible bile leak although the HIDA scan did not show with the patient clearly has bile tinged fluid coming out of his percutaneous drain.  Plan:  The patient will be set up for an ERCP for tomorrow.  The patient has been explained the risks and benefits including perforation bleeding pancreatitis and death.  He has also been told that he will likely need a stent placed at that time to facilitate drainage to stop any bile leak.  I have also put in a consult to pharmacy to have the heparin  held prior to the ERCP tomorrow.  The patient has been explained the plan and agrees with it.  Thank you for involving me in the care of this patient.      LOS: 5 days   Rogelia Copping, MD, MD. NOLIA 06/14/2024, 12:56 PM,  Pager [phone] 7am-5pm  Check AMION for 5pm -7am coverage and on weekends   Note: This dictation was prepared with Dragon dictation along with  smaller phrase technology. Any transcriptional errors that result from this process are unintentional.

## 2024-06-14 NOTE — Progress Notes (Signed)
 Patient clinically stable post IR US  liver biopsy per Dr Karalee, tolerated well. Vitals stable post procedure. Received Versed  1 mg along with Fentanyl  50 mcg IV for procedure. Report given to [place] Rn post procedure/specials/19

## 2024-06-15 ENCOUNTER — Telehealth (HOSPITAL_COMMUNITY): Payer: Self-pay | Admitting: Pharmacy Technician

## 2024-06-15 ENCOUNTER — Encounter
Admission: EM | Disposition: A | Discharge status: Home / Self Care | Payer: Self-pay | Source: Home / Self Care | Attending: Internal Medicine

## 2024-06-15 ENCOUNTER — Other Ambulatory Visit (HOSPITAL_COMMUNITY): Payer: Self-pay

## 2024-06-15 ENCOUNTER — Encounter: Payer: Self-pay | Admitting: Internal Medicine

## 2024-06-15 ENCOUNTER — Inpatient Hospital Stay: Admitting: Anesthesiology

## 2024-06-15 ENCOUNTER — Inpatient Hospital Stay

## 2024-06-15 ENCOUNTER — Telehealth: Payer: Self-pay

## 2024-06-15 DIAGNOSIS — K5710 Diverticulosis of small intestine without perforation or abscess without bleeding: Secondary | ICD-10-CM | POA: Diagnosis not present

## 2024-06-15 DIAGNOSIS — K838 Other specified diseases of biliary tract: Secondary | ICD-10-CM

## 2024-06-15 DIAGNOSIS — K8000 Calculus of gallbladder with acute cholecystitis without obstruction: Secondary | ICD-10-CM | POA: Diagnosis not present

## 2024-06-15 DIAGNOSIS — Z4659 Encounter for fitting and adjustment of other gastrointestinal appliance and device: Secondary | ICD-10-CM

## 2024-06-15 HISTORY — PX: BILIARY STENT PLACEMENT: SHX5538

## 2024-06-15 HISTORY — PX: HOT HEMOSTASIS: SHX5433

## 2024-06-15 HISTORY — PX: ERCP: SHX5425

## 2024-06-15 LAB — COMPREHENSIVE METABOLIC PANEL WITH GFR
ALT: 71 U/L — ABNORMAL HIGH (ref 0–44)
AST: 58 U/L — ABNORMAL HIGH (ref 15–41)
Albumin: 2.1 g/dL — ABNORMAL LOW (ref 3.5–5.0)
Alkaline Phosphatase: 65 U/L (ref 38–126)
Anion gap: 9 (ref 5–15)
BUN: 24 mg/dL — ABNORMAL HIGH (ref 8–23)
CO2: 22 mmol/L (ref 22–32)
Calcium: 8.3 mg/dL — ABNORMAL LOW (ref 8.9–10.3)
Chloride: 109 mmol/L (ref 98–111)
Creatinine, Ser: 0.95 mg/dL (ref 0.61–1.24)
GFR, Estimated: >60 mL/min (ref >60)
Glucose, Bld: 114 mg/dL — ABNORMAL HIGH (ref 70–99)
Potassium: 4.4 mmol/L (ref 3.5–5.1)
Sodium: 140 mmol/L (ref 135–145)
Total Bilirubin: 1.6 mg/dL — ABNORMAL HIGH (ref 0.0–1.2)
Total Protein: 5.1 g/dL — ABNORMAL LOW (ref 6.5–8.1)

## 2024-06-15 LAB — CBC
HCT: 42.7 % (ref 39.0–52.0)
Hemoglobin: 14.9 g/dL (ref 13.0–17.0)
MCH: 34.2 pg — ABNORMAL HIGH (ref 26.0–34.0)
MCHC: 34.9 g/dL (ref 30.0–36.0)
MCV: 97.9 fL (ref 80.0–100.0)
Platelets: 133 K/uL — ABNORMAL LOW (ref 150–400)
RBC: 4.36 MIL/uL (ref 4.22–5.81)
RDW: 15.8 % — ABNORMAL HIGH (ref 11.5–15.5)
WBC: 24.7 K/uL — ABNORMAL HIGH (ref 4.0–10.5)
nRBC: 0.1 % (ref 0.0–0.2)

## 2024-06-15 LAB — SURGICAL PATHOLOGY

## 2024-06-15 LAB — GLUCOSE, CAPILLARY
Glucose-Capillary: 121 mg/dL — ABNORMAL HIGH (ref 70–99)
Glucose-Capillary: 109 mg/dL — ABNORMAL HIGH (ref 70–99)
Glucose-Capillary: 160 mg/dL — ABNORMAL HIGH (ref 70–99)
Glucose-Capillary: 249 mg/dL — ABNORMAL HIGH (ref 70–99)

## 2024-06-15 LAB — MAGNESIUM: Magnesium: 2 mg/dL (ref 1.7–2.4)

## 2024-06-15 LAB — PHOSPHORUS: Phosphorus: 3.9 mg/dL (ref 2.5–4.6)

## 2024-06-15 SURGERY — ERCP, WITH INTERVENTION IF INDICATED
Anesthesia: General

## 2024-06-15 MED ORDER — GLYCOPYRROLATE 0.2 MG/ML IJ SOLN
INTRAMUSCULAR | Status: DC | PRN
  Administered 2024-06-15: .2 mg via INTRAVENOUS

## 2024-06-15 MED ORDER — LACTATED RINGERS IV SOLN: INTRAVENOUS | Status: DC

## 2024-06-15 MED ORDER — EPHEDRINE 5 MG/ML INJ
INTRAVENOUS | Status: AC
  Filled 2024-06-15: qty 5

## 2024-06-15 MED ORDER — ONDANSETRON HCL 4 MG/2ML IJ SOLN
INTRAMUSCULAR | Status: AC
Start: 2024-06-15 — End: 2024-06-15
  Filled 2024-06-15: qty 2

## 2024-06-15 MED ORDER — DICLOFENAC SUPPOSITORY 100 MG
100.0000 mg | Freq: Once | RECTAL | Status: AC
  Administered 2024-06-15: 100 mg via RECTAL

## 2024-06-15 MED ORDER — DEXAMETHASONE SODIUM PHOSPHATE 10 MG/ML IJ SOLN
INTRAMUSCULAR | Status: AC
Start: 2024-06-15 — End: 2024-06-15
  Filled 2024-06-15: qty 1

## 2024-06-15 MED ORDER — DEXAMETHASONE SODIUM PHOSPHATE 10 MG/ML IJ SOLN
INTRAMUSCULAR | Status: DC | PRN
  Administered 2024-06-15: 10 mg via INTRAVENOUS

## 2024-06-15 MED ORDER — PROPOFOL 10 MG/ML IV BOLUS
INTRAVENOUS | Status: DC | PRN
  Administered 2024-06-15 (×2): 50 mg via INTRAVENOUS
  Administered 2024-06-15: 150 mg via INTRAVENOUS

## 2024-06-15 MED ORDER — ONDANSETRON HCL 4 MG/2ML IJ SOLN
INTRAMUSCULAR | Status: DC | PRN
  Administered 2024-06-15: 4 mg via INTRAVENOUS

## 2024-06-15 MED ORDER — DICLOFENAC SUPPOSITORY 100 MG
RECTAL | Status: AC
  Filled 2024-06-15: qty 1

## 2024-06-15 MED ORDER — LIDOCAINE HCL (CARDIAC) PF 100 MG/5ML IV SOSY
PREFILLED_SYRINGE | INTRAVENOUS | Status: DC | PRN
  Administered 2024-06-15: 50 mg via INTRAVENOUS

## 2024-06-15 MED ORDER — GLYCOPYRROLATE 0.2 MG/ML IJ SOLN
INTRAMUSCULAR | Status: AC
  Filled 2024-06-15: qty 1

## 2024-06-15 MED ORDER — POLYETHYLENE GLYCOL 3350 17 G PO PACK
17.0000 g | PACK | Freq: Two times a day (BID) | ORAL | Status: DC
  Administered 2024-06-17: 17 g via ORAL
  Filled 2024-06-15 (×6): qty 1

## 2024-06-15 MED ORDER — SUCCINYLCHOLINE CHLORIDE 200 MG/10ML IV SOSY
PREFILLED_SYRINGE | INTRAVENOUS | Status: AC
  Filled 2024-06-15: qty 10

## 2024-06-15 MED ORDER — SUCCINYLCHOLINE CHLORIDE 200 MG/10ML IV SOSY
PREFILLED_SYRINGE | INTRAVENOUS | Status: DC | PRN
  Administered 2024-06-15: 120 mg via INTRAVENOUS

## 2024-06-15 MED ORDER — EPHEDRINE SULFATE-NACL 50-0.9 MG/10ML-% IV SOSY
PREFILLED_SYRINGE | INTRAVENOUS | Status: DC | PRN
  Administered 2024-06-15: 15 mg via INTRAVENOUS

## 2024-06-15 NOTE — Op Note (Addendum)
 [facility] Gastroenterology Patient Name: Nicholas Abbott [facility] Procedure Date: 06/15/2024 12:37 PM MRN: [id number] Account #: [id number] Date of Birth: 01/09/1951 Admit Type: Inpatient Age: 73 Room: [facility] ENDO ROOM 4 Gender: Male Note Status: Finalized Instrument Name: CELINDA [id number] Procedure:             ERCP Indications:           Bile leak Providers:             Rogelia Copping MD, MD Referring MD:          Lavenia Beaver, MD (Referring MD) Medicines:             General Anesthesia Complications:         No immediate complications. Procedure:             Pre-Anesthesia Assessment:                        - Prior to the procedure, a History and Physical was                         performed, and patient medications and allergies were                         reviewed. The patient's tolerance of previous                         anesthesia was also reviewed. The risks and benefits                         of the procedure and the sedation options and risks                         were discussed with the patient. All questions were                         answered, and informed consent was obtained. Prior                         Anticoagulants: The patient has taken heparin , last                         dose was 1 day prior to procedure. ASA Grade                         Assessment: II - A patient with mild systemic disease.                         After reviewing the risks and benefits, the patient                         was deemed in satisfactory condition to undergo the                         procedure.                        After obtaining informed consent, the scope was passed  under direct vision. Throughout the procedure, the                         patient's blood pressure, pulse, and oxygen                         saturations were monitored continuously. The                         Duodenoscope was introduced through the mouth, and                          used to inject contrast into and used to inject                         contrast into the bile duct. The ERCP was accomplished                         without difficulty. The patient tolerated the                         procedure well. Findings:      The scout film was normal. The major papilla was on the rim of a       diverticulum. A wire was passed into the biliary tree. The bile duct was       deeply cannulated with the short-nosed traction sphincterotome. Contrast       was injected. I personally interpreted the bile duct images. There was       brisk flow of contrast through the ducts. Image quality was excellent.       Contrast extended to the entire biliary tree. A 6 mm biliary       sphincterotomy was made with a monofilament traction (standard)       sphincterotome using ERBE electrocautery. There was no       post-sphincterotomy bleeding. One 10 Fr by 9 cm plastic stent with a       single external flap and a single internal flap was placed 7 cm into the       common bile duct. Bile flowed through the stent. The stent was in good       position. Impression:            - The major papilla was on the rim of a diverticulum.                        - A biliary sphincterotomy was performed.                        - One plastic stent was placed into the common bile                         duct. Recommendation:        - Return patient to hospital ward for ongoing care.                        - Clear liquid diet today.                        - Watch for pancreatitis, bleeding, perforation, and  cholangitis.                        - Repeat ERCP in 3 months to remove stent. Procedure Code(s):     --- Professional ---                        [phone], Endoscopic retrograde cholangiopancreatography                         (ERCP); with placement of endoscopic stent into                         biliary or pancreatic duct, including pre- and                          post-dilation and guide wire passage, when performed,                         including sphincterotomy, when performed, each stent                        25671, Endoscopic catheterization of the biliary                         ductal system, radiological supervision and                         interpretation Diagnosis Code(s):     --- Professional ---                        K83.8, Other specified diseases of biliary tract CPT copyright 2022 American Medical Association. All rights reserved. The codes documented in this report are preliminary and upon coder review may  be revised to meet current compliance requirements. Rogelia Copping MD, MD 06/15/2024 1:51:23 PM This report has been signed electronically. Number of Addenda: 0 Note Initiated On: 06/15/2024 12:37 PM Estimated Blood Loss:  Estimated blood loss: none.      [facility]

## 2024-06-15 NOTE — Anesthesia Procedure Notes (Signed)
 Procedure Name: Intubation Date/Time: 06/15/2024 1:00 PM  Performed by: Stacie Channel, CRNAPre-anesthesia Checklist: Patient identified, Patient being monitored, Timeout performed, Emergency Drugs available and Suction available Patient Re-evaluated:Patient Re-evaluated prior to induction Oxygen Delivery Method: Circle system utilized Preoxygenation: Pre-oxygenation with 100% oxygen Induction Type: IV induction Ventilation: Mask ventilation without difficulty Laryngoscope Size: McGrath and 4 Grade View: Grade I Tube type: Oral Tube size: 7.0 mm Number of attempts: 1 Airway Equipment and Method: Stylet Placement Confirmation: ETT inserted through vocal cords under direct vision, positive ETCO2 and breath sounds checked- equal and bilateral Secured at: 22 cm Tube secured with: Tape Dental Injury: Teeth and Oropharynx as per pre-operative assessment

## 2024-06-15 NOTE — Plan of Care (Signed)

## 2024-06-15 NOTE — Plan of Care (Signed)

## 2024-06-15 NOTE — Progress Notes (Signed)
 Progress Note   Patient: Nicholas Abbott FMW:[id number] DOB: 03/29/1951 DOA: 06/08/2024     6 DOS: the patient was seen and examined on 06/15/2024   Brief hospital course: 73yo with h/o CAD s/p stenting, HTN, HLD, OSA on CPAP, cirrhosis with chronic thrombocytopenia, and class 1 obesity who presented on 8/7 with abdominal pain.  Found to have acute cholecystitis, now s/p lap chole with drain placement.  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  Oncology is also following.  Assessment and Plan:  Sepsis due to acute cholecystitis, now with bile leak Sepsis physiology met with leukocytosis, fever on presentation; physiology has resolved although he remains with tachycardia Suspected source is infected stone within the gallbladder neck and resultant cholecystitis Status post laparoscopic cholecystectomy on 8/9 JP drain left in place and continues to have copious brown drainage HIDA scan with contained bile leak, likely secondary to the remnant cystic duct portion Will stop Ceftriaxone  after 8/14 dose, as he has completed 7 days of therapy GI was consulted  ERCP on 8/14 with biliary sphincterotomy and stent placement Will need to be observed for post-ERCP pancreatitis or other complications Will need f/u ERCP in 3 months to remove stent  Multiple liver and pancreatic lesions Noted on dedicated liver MRI Liver lesions concerning for HCC in the background of cirrhosis; 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 Melanee will arrange for outpatient f/u on 8/22   Portal venous thrombosis Started on heparin  today but this will be held until after ERCP on 8/14 Will plan to start heparin  on 8/15 and transition to Eliquis   on 8/16 if no apparent post-procedure bleeding  Pharmacy says Eliquis  will cost about $115 per month; patient and wife will explore options but are likely to proceed with Eliquis    Liver cirrhosis with chronic transaminitis/hyperbilirubinemia and chronic thrombocytopenia Unknown etiology, alcoholic?,  NASH? Autoimune? Does not appear to have had a significant ETOH use d/o in his lifetime based on today's questioning Appears to be stable, no significant impairment of synthetic function of liver with normal albumin level and INR   CAD Holding aspirin  Plavix  Will likely transition to DOAC therapy   HTN Continue Toprol  XL, Jardiance  Resume hydrochlorothiazide /lisinopril  when needed  HLD Continue Zetia, rosuvastatin   Hypothyroidism Continue Synthroid , Cytomel    Sinus bradycardia Frequent PVCs Compared to his recent office record, baseline heart rate in 40-60s and blood pressure stable As per recommendation from [facility]ology at [facility], will continue low-dose metoprolol    Class 1 obesity Body mass index is 32.66 kg/m.SABRA  Weight loss should be encouraged Outpatient PCP/bariatric medicine f/u encouraged Significantly low or high BMI is associated with higher medical risk including morbidity and mortality           Consultants: Cardiology Surgery Oncology   Procedures: Echocardiogram 8/8 Laparoscopic cholecystectomy 8/9 US -guided Liver biopsy (IR) 8/13 ERCP 8/14   Antibiotics: Ceftriaxone  8/8-14  30 Day Unplanned Readmission Risk Score    Flowsheet Row ED to Hosp-Admission (Current) from 06/08/2024 in [facility] GENERAL SURGERY  30 Day Unplanned Readmission Risk Score (%) 15.31 Filed at 06/15/2024 0801    This score is the patient's risk of an unplanned readmission within 30 days of being discharged (0 -100%). The score is based on dignosis, age, lab data,  medications, orders, and past utilization.   Low:  0-14.9   Medium: 15-21.9   High: 22-29.9    Extreme: 30 and above           Subjective: Seen just prior to ERCP.  He was anxious about the procedure, still having copious biliary and/or ascites drainage   Objective: Vitals:   06/15/24 1410 06/15/24 1420  BP: 108/69 112/75  Pulse: 72 68  Resp: 14 (!) 22  Temp:    SpO2: 98% 99%    Intake/Output Summary (Last 24 hours) at 06/15/2024 1425 Last data filed at 06/15/2024 1404 Gross per 24 hour  Intake 879.4 ml  Output 1765 ml  Net -885.6 ml   Filed Weights   06/08/24 0636 06/11/24 1500 06/15/24 0500  Weight: 110.7 kg 115.4 kg 108.3 kg    Exam:  General:  Appears calm and comfortable and is in NAD Eyes:  normal lids, iris ENT:  grossly normal hearing, lips & tongue, mmm Cardiovascular:  RRR. No LE edema.  Respiratory:   CTA bilaterally with no wheezes/rales/rhonchi.  Normal respiratory effort. Abdomen:  soft, NT, ND; surgical wounds healing nicely; R-sided JP drain in place with copious brown drainage Skin:  no rash or induration seen on limited exam; LLQ abdominal ecchymosis noted Musculoskeletal:  grossly normal tone BUE/BLE, good ROM, no bony abnormality Psychiatric:  grossly normal mood and affect, speech fluent and appropriate, AOx3 Neurologic:  CN 2-12 grossly intact, moves all extremities in coordinated fashion   Data Reviewed: I have reviewed the patient's lab results since admission.  Pertinent labs for today include:   Glucose 114 Albumin 2.1 AST 58/ALT 71/Bili 1.6, stable WBC 24.7 Platelets 133, stable     Family Communication: Wife was present throughout evaluation  Disposition: Status is: Inpatient Remains inpatient appropriate because: ongoing management     Time spent: 50 minutes  Unresulted Labs (From admission, onward)     Start     Ordered   06/16/24 0500  CBC with Differential/Platelet  Tomorrow morning,   R       Question:  Specimen collection method  Answer:  Lab=Lab collect   06/15/24 1425   06/16/24 0500  Comprehensive  metabolic panel with GFR  Tomorrow morning,   R       Question:  Specimen collection method  Answer:  Lab=Lab collect   06/15/24 1425             Author: Delon Herald, MD 06/15/2024 2:25 PM  For on call review [email or web address].

## 2024-06-15 NOTE — Telephone Encounter (Addendum)
 Patient Product/process development scientist completed.    The patient is insured through [employer]. Patient has Medicare and is not eligible for a copay card, but may be able to apply for patient assistance or Medicare RX Payment Plan (Patient Must reach out to their plan, if eligible for payment plan), if available.    Ran test claim for Eliquis  5 mg and the current 30 day co-pay is $116.06.  Ran test claim for Xarelto 20 mg and the current 30 day co-pay is $114.48.  This test claim was processed through [facility]- copay amounts may vary at other pharmacies due to pharmacy/plan contracts, or as the patient moves through the different stages of their insurance plan.     Reyes Sharps, CPHT Pharmacy Technician III Certified Patient Advocate [facility] Patient Advocate Team Direct Number: [phone]  Fax: [phone]

## 2024-06-15 NOTE — Anesthesia Preprocedure Evaluation (Signed)
 Anesthesia Evaluation  Patient identified by MRN, date of birth, ID band Patient awake    Reviewed: Allergy & Precautions, NPO status , Patient's Chart, lab work & pertinent test results  History of Anesthesia Complications Negative for: history of anesthetic complications  Airway Mallampati: II  TM Distance: >3 FB Neck ROM: full    Dental  (+) Dental Advidsory Given   Pulmonary neg shortness of breath, sleep apnea , neg COPD, neg recent URI   Pulmonary exam normal        Cardiovascular hypertension, (-) angina + CAD, + Past MI and + Cardiac Stents  Normal cardiovascular exam(-) dysrhythmias (-) Valvular Problems/Murmurs  EKG: NSR, PVCs   Neuro/Psych negative neurological ROS  negative psych ROS   GI/Hepatic ,GERD  Medicated,,(+)     substance abuse  alcohol use  Endo/Other  diabetes, Type 2    Renal/GU ARFRenal disease     Musculoskeletal   Abdominal   Peds  Hematology negative hematology ROS (+)   Anesthesia Other Findings Past Medical History: No date: Basal cell carcinoma     Comment:  L ear, txted in past No date: Diabetes mellitus without complication (HCC) No date: GERD (gastroesophageal reflux disease) No date: Hyperlipidemia No date: Hypertension No date: Left shoulder pain No date: Myocardial infarction [facility])  Past Surgical History: No date: CATARACT EXTRACTION 01/01/2020: COLONOSCOPY WITH PROPOFOL ; N/A     Comment:  Procedure: COLONOSCOPY WITH PROPOFOL ;  Surgeon: [name],               [name], MD;  Location: [facility] ENDOSCOPY;  Service:               Gastroenterology;  Laterality: N/A; No date: CORONARY ANGIOPLASTY 01/01/2020: ESOPHAGOGASTRODUODENOSCOPY (EGD) WITH PROPOFOL ; N/A     Comment:  Procedure: ESOPHAGOGASTRODUODENOSCOPY (EGD) WITH               PROPOFOL ;  Surgeon: [name], [name], MD;  Location:               [facility] ENDOSCOPY;  Service: Gastroenterology;  Laterality:                N/A; No date: EYE SURGERY 04/29/2022: LEFT HEART CATH AND CORONARY ANGIOGRAPHY; N/A     Comment:  Procedure: LEFT HEART CATH AND CORONARY ANGIOGRAPHY;                Surgeon: [name], MD;  Location: [facility]               INVASIVE CV LAB;  Service: Cardiovascular;  Laterality:               N/A; No date: RETINAL DETACHMENT SURGERY  BMI    Body Mass Index: 31.33 kg/m      Reproductive/Obstetrics negative OB ROS                              Anesthesia Physical Anesthesia Plan  ASA: 3  Anesthesia Plan: General   Post-op Pain Management:    Induction: Intravenous  PONV Risk Score and Plan: 2 and Ondansetron , Dexamethasone  and Treatment may vary due to age or medical condition  Airway Management Planned: Oral ETT  Additional Equipment:   Intra-op Plan:   Post-operative Plan: Extubation in OR  Informed Consent: I have reviewed the patients History and Physical, chart, labs and discussed the procedure including the risks, benefits and alternatives for the proposed anesthesia with the patient or  authorized representative who has indicated his/her understanding and acceptance.     Dental Advisory Given  Plan Discussed with: Anesthesiologist, CRNA and Surgeon  Anesthesia Plan Comments: (Patient consented for risks of anesthesia including but not limited to:  - adverse reactions to medications - damage to eyes, teeth, lips or other oral mucosa - nerve damage due to positioning  - sore throat or hoarseness - Damage to heart, brain, nerves, lungs, other parts of body or loss of life  Patient voiced understanding and assent.)         Anesthesia Quick Evaluation

## 2024-06-15 NOTE — Telephone Encounter (Signed)
 Stent removal 3 mth

## 2024-06-15 NOTE — Transfer of Care (Signed)
 Immediate Anesthesia Transfer of Care Note  Patient: Nicholas Abbott  Procedure(s) Performed: ERCP, WITH INTERVENTION IF INDICATED INSERTION, STENT, BILE DUCT EGD, WITH ARGON PLASMA COAGULATION  Patient Location: PACU  Anesthesia Type:General  Level of Consciousness: sedated  Airway & Oxygen Therapy: Patient Spontanous Breathing  Post-op Assessment: Report given to RN and Post -op Vital signs reviewed and stable  Post vital signs: Reviewed and stable  Last Vitals:  Vitals Value Taken Time  BP 109/67 06/15/24 14:03  Temp    Pulse 75 06/15/24 14:05  Resp 23 06/15/24 14:05  SpO2 96 % 06/15/24 14:05  Vitals shown include unfiled device data.  Last Pain:  Vitals:   06/15/24 1207  TempSrc: Temporal  PainSc:          Complications: No notable events documented.

## 2024-06-15 NOTE — Progress Notes (Signed)
 [name]ICAL PROGRESS NOTE  Hospital Day(s): 6.   Post op day(s): 5 Days Post-Op.   Interval History: Patient seen and examined, no acute events or new complaints overnight.  Patient now has consistent high output from the Jackson-Pratt left in the region of the gallbladder fossa, now what appears consistent with bile.   Patient reports tolerance of diet, good pain control.  Had some back pain when not keeping up charging the bulb.  N.p.o. for ERCP today.  Review of Systems:  Constitutional: denies fever, chills  Respiratory: denies any shortness of breath  Cardiovascular: denies chest pain or palpitations  Gastrointestinal: denies N/V, or diarrhea/and bowel function as per interval history Musculoskeletal: denies pain, decreased motor or sensation Integumentary: denies any other rashes or skin discolorations  Vital signs in last 24 hours: [min-max] current  Temp:  [96.9 F (36.1 C)-98.6 F (37 C)] 98.3 F (36.8 C) (08/14 0500) Pulse Rate:  [55-84] 60 (08/14 0500) Resp:  [14-23] 18 (08/14 0500) BP: (132-154)/(73-93) 150/82 (08/14 0500) SpO2:  [91 %-96 %] 93 % (08/14 0500) FiO2 (%):  [21 %] 21 % (08/13 1938) Weight:  [108.3 kg] 108.3 kg (08/14 0500)     Height: 6' 2 (188 cm) Weight: 108.3 kg BMI (Calculated): 30.64   Intake/Output last 2 shifts:  08/13 0701 - 08/14 0700 In: 479.4 [I.V.:97.1; IV Piggyback:382.3] Out: 1625 [Drains:1625]   Physical Exam:  Constitutional: alert, cooperative and no distress  Respiratory: breathing non-labored at rest  Cardiovascular: regular rate and rhythm  Gastrointestinal: soft, non-tender, and non-distended Integumentary: Skin incisions are clean dry and intact.  Right lower quadrant JP drain with a full bulb consistent with bile.  Labs:     Latest Ref Rng & Units 06/15/2024    4:17 AM 06/14/2024    6:14 AM 06/13/2024    5:26 AM  CBC  WBC 4.0 - 10.5 K/uL 24.7  21.4  20.9   Hemoglobin 13.0 - 17.0 g/dL 85.0  85.4   86.4   Hematocrit 39.0 - 52.0 % 42.7  42.4  39.4   Platelets 150 - 400 K/uL 133  136  131       Latest Ref Rng & Units 06/15/2024    4:17 AM 06/14/2024    6:14 AM 06/13/2024    5:26 AM  CMP  Glucose 70 - 99 mg/dL 885  869  885   BUN 8 - 23 mg/dL 24  23  28    Creatinine 0.61 - 1.24 mg/dL 9.04  8.99  9.03   Sodium 135 - 145 mmol/L 140  140  140   Potassium 3.5 - 5.1 mmol/L 4.4  3.6  4.0   Chloride 98 - 111 mmol/L 109  109  113   CO2 22 - 32 mmol/L 22  22  22    Calcium  8.9 - 10.3 mg/dL 8.3  8.3  8.4   Total Protein 6.5 - 8.1 g/dL 5.1  5.0    Total Bilirubin 0.0 - 1.2 mg/dL 1.6  1.3    Alkaline Phos 38 - 126 U/L 65  73    AST 15 - 41 U/L 58  64    ALT 0 - 44 U/L 71  79       Imaging studies: No new pertinent imaging studies   Assessment/Plan:  73 y.o. male with  5 Days Post-Op s/p robotic subtotal cholecystectomy with reconstitution for acute calculus cholecystitis, complicated by pertinent comorbidities including:   Patient Active Problem List   Diagnosis Date  Noted   Bile leak 06/14/2024   Portal vein thrombosis 06/14/2024   Calculus of gallbladder with acute cholecystitis without obstruction 06/10/2024   Cirrhosis of liver with ascites (HCC) 06/10/2024   Liver masses 06/10/2024   Abdominal pain 06/08/2024   Closed nondisplaced fracture of fifth cervical vertebra with routine healing 10/25/2023   Pain in joint of right shoulder 10/25/2023   OSA on CPAP 08/30/2023   CPAP use counseling 08/30/2023   S/P angioplasty with stent 05/01/2022   GERD (gastroesophageal reflux disease) 04/28/2021   Sleep apnea 04/28/2021   Hyperlipidemia 04/28/2021   Hyperlipidemia    Hypertension    Diabetes mellitus without complication (HCC)    CAD (coronary artery disease)    Thrombocytopenia (HCC)    Acute metabolic encephalopathy    Hypoglycemia    Type 2 diabetes mellitus without complication, without long-term current use of insulin  (HCC) 02/11/2017   Combined arterial insufficiency  and corporo-venous occlusive erectile dysfunction 02/10/2017   Coronary artery disease 06/06/2014   Hypertension 06/06/2014   Obesity 05/30/2012   Myocardial infarction (HCC) 03/02/2000    - Continue to keep up with recharging bulb for [place] drain.  - Residual GB reconstitution with apparent bile leak,  Dr. Jinny on board, and ERCP today.   -  some acute thrombus in left portal vein, would  entertain anticoagulation, when clear with Dr. Jinny.  - Path from percutaneous liver biopsy confirms moderately differentiated hepatocellular carcinoma.  All of the above findings and recommendations were discussed with the patient, and all of patient's questions were answered to their expressed satisfaction.  -- [name], M.D., [facility] 06/15/2024

## 2024-06-16 ENCOUNTER — Other Ambulatory Visit: Payer: Self-pay | Admitting: Oncology

## 2024-06-16 ENCOUNTER — Encounter: Payer: Self-pay | Admitting: Gastroenterology

## 2024-06-16 DIAGNOSIS — K8000 Calculus of gallbladder with acute cholecystitis without obstruction: Secondary | ICD-10-CM | POA: Diagnosis not present

## 2024-06-16 LAB — CBC WITH DIFFERENTIAL/PLATELET
Abs Immature Granulocytes: 1.49 K/uL — ABNORMAL HIGH (ref 0.00–0.07)
Basophils Absolute: 0.2 K/uL — ABNORMAL HIGH (ref 0.0–0.1)
Basophils Relative: 1 %
Eosinophils Absolute: 0.1 K/uL (ref 0.0–0.5)
Eosinophils Relative: 0 %
HCT: 44 % (ref 39.0–52.0)
Hemoglobin: 15.1 g/dL (ref 13.0–17.0)
Immature Granulocytes: 6 %
Lymphocytes Relative: 53 %
Lymphs Abs: 14.6 K/uL — ABNORMAL HIGH (ref 0.7–4.0)
MCH: 34 pg (ref 26.0–34.0)
MCHC: 34.3 g/dL (ref 30.0–36.0)
MCV: 99.1 fL (ref 80.0–100.0)
Monocytes Absolute: 1.2 K/uL — ABNORMAL HIGH (ref 0.1–1.0)
Monocytes Relative: 5 %
Neutro Abs: 9.4 K/uL — ABNORMAL HIGH (ref 1.7–7.7)
Neutrophils Relative %: 35 %
Platelets: 147 K/uL — ABNORMAL LOW (ref 150–400)
RBC: 4.44 MIL/uL (ref 4.22–5.81)
RDW: 15.9 % — ABNORMAL HIGH (ref 11.5–15.5)
Smear Review: NORMAL
WBC: 26.6 K/uL — ABNORMAL HIGH (ref 4.0–10.5)
nRBC: 0.1 % (ref 0.0–0.2)

## 2024-06-16 LAB — PROTIME-INR
INR: 1.3 — ABNORMAL HIGH (ref 0.8–1.2)
Prothrombin Time: 17 s — ABNORMAL HIGH (ref 11.4–15.2)

## 2024-06-16 LAB — COMPREHENSIVE METABOLIC PANEL WITH GFR
ALT: 69 U/L — ABNORMAL HIGH (ref 0–44)
AST: 54 U/L — ABNORMAL HIGH (ref 15–41)
Albumin: 2.1 g/dL — ABNORMAL LOW (ref 3.5–5.0)
Alkaline Phosphatase: 64 U/L (ref 38–126)
Anion gap: 8 (ref 5–15)
BUN: 34 mg/dL — ABNORMAL HIGH (ref 8–23)
CO2: 23 mmol/L (ref 22–32)
Calcium: 8.6 mg/dL — ABNORMAL LOW (ref 8.9–10.3)
Chloride: 105 mmol/L (ref 98–111)
Creatinine, Ser: 1.38 mg/dL — ABNORMAL HIGH (ref 0.61–1.24)
GFR, Estimated: 54 mL/min — ABNORMAL LOW (ref >60)
Glucose, Bld: 150 mg/dL — ABNORMAL HIGH (ref 70–99)
Potassium: 5.4 mmol/L — ABNORMAL HIGH (ref 3.5–5.1)
Sodium: 136 mmol/L (ref 135–145)
Total Bilirubin: 1.4 mg/dL — ABNORMAL HIGH (ref 0.0–1.2)
Total Protein: 5 g/dL — ABNORMAL LOW (ref 6.5–8.1)

## 2024-06-16 LAB — GLUCOSE, CAPILLARY: Glucose-Capillary: 131 mg/dL — ABNORMAL HIGH (ref 70–99)

## 2024-06-16 LAB — APTT: aPTT: 31 s (ref 24–36)

## 2024-06-16 MED ORDER — BISACODYL 5 MG PO TBEC: 10.0000 mg | DELAYED_RELEASE_TABLET | Freq: Once | ORAL | Status: DC

## 2024-06-16 MED ORDER — LACTATED RINGERS IV SOLN: INTRAVENOUS | Status: AC

## 2024-06-16 MED ORDER — HEPARIN (PORCINE) 25000 UT/250ML-% IV SOLN
1450.0000 [IU]/h | INTRAVENOUS | Status: DC
  Administered 2024-06-16 – 2024-06-17 (×2): 1750 [IU]/h via INTRAVENOUS
  Administered 2024-06-18 – 2024-06-19 (×2): 1450 [IU]/h via INTRAVENOUS
  Filled 2024-06-16 (×5): qty 250

## 2024-06-16 MED ORDER — SODIUM CHLORIDE 0.9 % IV SOLN
2.0000 g | INTRAVENOUS | Status: DC
  Administered 2024-06-16 – 2024-06-17 (×2): 2 g via INTRAVENOUS
  Filled 2024-06-16 (×2): qty 20

## 2024-06-16 MED ORDER — HEPARIN BOLUS VIA INFUSION
7000.0000 [IU] | Freq: Once | INTRAVENOUS | Status: AC
  Administered 2024-06-16: 7000 [IU] via INTRAVENOUS
  Filled 2024-06-16: qty 7000

## 2024-06-16 NOTE — Plan of Care (Signed)

## 2024-06-16 NOTE — TOC CM/SW Note (Signed)
 Transition of Care [facility]) - Inpatient Brief Assessment   Patient Details  Name: Nicholas Abbott MRN: [id number] Date of Birth: February 08, 1951  Transition of Care [facility]) CM/SW Contact:    Asberry CHRISTELLA Jaksch, RN Phone Number: 06/16/2024, 9:05 AM   Clinical Narrative:   Transition of Care [facility]) Screening Note   Patient Details  Name: Nicholas Abbott Date of Birth: [birth year]   Transition of Care [facility]) CM/SW Contact:    Asberry CHRISTELLA Jaksch, RN Phone Number: 06/16/2024, 9:05 AM    Transition of Care Department [facility]) has reviewed patient and no TOC needs have been identified at this time. If new patient transition needs arise, please place a TOC consult.    Transition of Care Asessment: Insurance and Status: Insurance coverage has been reviewed Patient has primary care physician: Yes     Prior/Current Home Services: No current home services Social Drivers of Health Review: SDOH reviewed no interventions necessary Readmission risk has been reviewed: Yes Transition of care needs: no transition of care needs at this time

## 2024-06-16 NOTE — Progress Notes (Signed)
 [facility] SUR[facility]ICAL PROGRESS NOTE  Hospital Day(s): 7.   Post op day(s): 1 Day Post-Op. S/p ERCP with stent for biliary leak.   Interval History: Patient seen and examined, no acute events or new complaints overnight.  Patient has consistent high output from the Jackson-Pratt left in the region of the gallbladder fossa, still what appears consistent with bile, hopefully this will diminish with biliary stent.   Patient reports tolerance of diet, good pain control.  Wants to shower.   Review of Systems:  Constitutional: denies fever, chills  Respiratory: denies any shortness of breath  Cardiovascular: denies chest pain or palpitations  Gastrointestinal: denies N/V, or diarrhea/and bowel function as per interval history Musculoskeletal: denies pain, decreased motor or sensation Integumentary: denies any other rashes or skin discolorations  Vital signs in last 24 hours: [min-max] current  Temp:  [97 F (36.1 C)-98 F (36.7 C)] 97.5 F (36.4 C) (08/15 0306) Pulse Rate:  [44-76] 44 (08/15 0306) Resp:  [14-23] 20 (08/15 0306) BP: (106-130)/(65-84) 126/77 (08/15 0306) SpO2:  [94 %-99 %] 97 % (08/15 0306) FiO2 (%):  [21 %] 21 % (08/14 2320) Weight:  [110.4 kg] 110.4 kg (08/15 0436)     Height: 6' 2 (188 cm) Weight: 110.4 kg BMI (Calculated): 31.24   Intake/Output last 2 shifts:  08/14 0701 - 08/15 0700 In: 520 [P.O.:120; I.V.:400] Out: 1850 [Drains:1850]   Physical Exam:  Constitutional: alert, cooperative and no distress  Respiratory: breathing non-labored at rest  Cardiovascular: regular rate and rhythm  Gastrointestinal: soft, non-tender, and non-distended Integumentary: Skin incisions are clean dry and intact.  Right lower quadrant JP drain with a full bulb consistent with bile.  Labs:     Latest Ref Rng & Units 06/16/2024    3:38 AM 06/15/2024    4:17 AM 06/14/2024    6:14 AM  CBC  WBC 4.0 - 10.5 K/uL 26.6  24.7  21.4   Hemoglobin 13.0 - 17.0 g/dL 84.8   85.0  85.4   Hematocrit 39.0 - 52.0 % 44.0  42.7  42.4   Platelets 150 - 400 K/uL 147  133  136       Latest Ref Rng & Units 06/16/2024    3:38 AM 06/15/2024    4:17 AM 06/14/2024    6:14 AM  CMP  Glucose 70 - 99 mg/dL 849  885  869   BUN 8 - 23 mg/dL 34  24  23   Creatinine 0.61 - 1.24 mg/dL 8.61  9.04  8.99   Sodium 135 - 145 mmol/L 136  140  140   Potassium 3.5 - 5.1 mmol/L 5.4  4.4  3.6   Chloride 98 - 111 mmol/L 105  109  109   CO2 22 - 32 mmol/L 23  22  22    Calcium  8.9 - 10.3 mg/dL 8.6  8.3  8.3   Total Protein 6.5 - 8.1 g/dL 5.0  5.1  5.0   Total Bilirubin 0.0 - 1.2 mg/dL 1.4  1.6  1.3   Alkaline Phos 38 - 126 U/L 64  65  73   AST 15 - 41 U/L 54  58  64   ALT 0 - 44 U/L 69  71  79      Imaging studies: No new pertinent imaging studies   Assessment/Plan:  73 y.o. male with  1 Day Post-Op s/p ERCP with stent placement for biliary leak following robotic subtotal cholecystectomy with reconstitution for acute calculus cholecystitis:  Patient Active Problem List   Diagnosis Date Noted   Bile duct leak 06/15/2024   Bile leak 06/14/2024   Portal vein thrombosis 06/14/2024   Calculus of gallbladder with acute cholecystitis without obstruction 06/10/2024   Cirrhosis of liver with ascites (HCC) 06/10/2024   Liver masses 06/10/2024   Abdominal pain 06/08/2024   Closed nondisplaced fracture of fifth cervical vertebra with routine healing 10/25/2023   Pain in joint of right shoulder 10/25/2023   OSA on CPAP 08/30/2023   CPAP use counseling 08/30/2023   S/P angioplasty with stent 05/01/2022   GERD (gastroesophageal reflux disease) 04/28/2021   Sleep apnea 04/28/2021   Hyperlipidemia 04/28/2021   Hyperlipidemia    Hypertension    Diabetes mellitus without complication (HCC)    CAD (coronary artery disease)    Thrombocytopenia (HCC)    Acute metabolic encephalopathy    Hypoglycemia    Type 2 diabetes mellitus without complication, without long-term current use of insulin   (HCC) 02/11/2017   Combined arterial insufficiency and corporo-venous occlusive erectile dysfunction 02/10/2017   Coronary artery disease 06/06/2014   Hypertension 06/06/2014   Obesity 05/30/2012   Myocardial infarction (HCC) 03/02/2000    - Continue to keep up with recharging bulb for [place] drain.  -  some acute thrombus in left portal vein, would  entertain anticoagulation, when clear with Dr. Jinny.  - Path from percutaneous liver biopsy confirms moderately differentiated hepatocellular carcinoma.  They are strongly motivated to go home, will ck procalc, tomorrow... ? Role of abx... doubtful needed, yet with persistent leukocytosis.    All of the above findings and recommendations were discussed with the patient, and all of patient's questions were answered to their expressed satisfaction.  -- [name], M.D., [facility] 06/16/2024

## 2024-06-16 NOTE — Consult Note (Signed)
 PHARMACY - ANTICOAGULATION CONSULT NOTE  Pharmacy Consult for heparin  Indication: portal vein thrombosis  Allergies  Allergen Reactions   Pioglitazone Rash    Patient Measurements: Height: 6' 2 (188 cm) Weight: 110.4 kg (243 lb 6.2 oz) IBW/kg (Calculated) : 82.2 HEPARIN  DW (KG): 105  Vital Signs:    Labs: Recent Labs    06/14/24 0614 06/15/24 0417 06/16/24 0338  HGB 14.5 14.9 15.1  HCT 42.4 42.7 44.0  PLT 136* 133* 147*  APTT 33  --   --   LABPROT 15.9*  --   --   INR 1.2  --   --   CREATININE 1.00 0.95 1.38*    Estimated Creatinine Clearance: 62.1 mL/min (A) (by C-G formula based on SCr of 1.38 mg/dL (H)).   Medical History: Past Medical History:  Diagnosis Date   Basal cell carcinoma    L ear, txted in past   Diabetes mellitus without complication (HCC)    GERD (gastroesophageal reflux disease)    Hyperlipidemia    Hypertension    Left shoulder pain    Myocardial infarction (HCC)     Medications:  No chronic AC PTA PTA aspirin  81mg   Assessment: Patient is 73yom admitted for abdominal pain on 8/7 and PMH of HTN, HLD, diabetes, CAD s/p stenting, OSA on CPAP, obesity (BMI 32.7), chronic thrombocytopenia, and liver cirrhosis. S/p cholecystectomy on 8/9, then liver biopsy on 8/13, then ERCP on 8/14. Biopsy from 8/13 showed hepatocellular carcinoma.  Imaging this admission notable for acute non-occlusive left portal vein thrombus. Patient was initiated on heparin  on 8/13 for portal vein thrombosis but anticoagulation was then held while patient underwent biopsy and ERCP. Pharmacy has now been consulted to resume heparin  with tentative plans to switch to DOAC on 8/16.  Goal of Therapy:  Heparin  level 0.3-0.7 units/ml Monitor platelets by anticoagulation protocol: Yes   Plan:  Give 7000 units bolus x 1 Start heparin  infusion at 1750 units/hr Check anti-Xa level in 8 hours and daily while on heparin  Continue to monitor H&H and platelets  Elsie CHRISTELLA Piety 06/16/2024,3:36 PM

## 2024-06-16 NOTE — Plan of Care (Signed)
  Problem: Health Behavior/Discharge Planning: Goal: Ability to manage health-related needs will improve Outcome: Progressing   Problem: Clinical Measurements: Goal: Ability to maintain clinical measurements within normal limits will improve Outcome: Progressing   Problem: Activity: Goal: Risk for activity intolerance will decrease Outcome: Progressing   Problem: Nutrition: Goal: Adequate nutrition will be maintained Outcome: Progressing   Problem: Elimination: Goal: Will not experience complications related to bowel motility Outcome: Progressing Goal: Will not experience complications related to urinary retention Outcome: Progressing

## 2024-06-16 NOTE — Progress Notes (Signed)
 Progress Note   Patient: Nicholas Abbott FMW:[id number] DOB: Jun 16, 1951 DOA: 06/08/2024     7 DOS: the patient was seen and examined on 06/16/2024   Brief hospital course: 73yo with h/o CAD s/p stenting, HTN, HLD, OSA on CPAP, cirrhosis with chronic thrombocytopenia, and class 1 obesity who presented on 8/7 with abdominal pain.  Found to have acute cholecystitis, now s/p lap chole with drain placement.  Had biliary leak and required stent placement.  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  Oncology is also following.  Also with portal venous thrombosis, needs AC.  Assessment and Plan:  Sepsis due to acute cholecystitis, now with bile leak Sepsis physiology met with leukocytosis, fever on presentation; physiology has resolved although he remains with tachycardia Suspected source is infected stone within the gallbladder neck and resultant cholecystitis Status post laparoscopic cholecystectomy on 8/9 JP drain left in place and continues to have copious brown drainage HIDA scan with contained bile leak, likely secondary to the remnant cystic duct portion GI was consulted  ERCP on 8/14 with biliary sphincterotomy and stent placement Will need to be observed for post-ERCP pancreatitis or other complications Will need f/u ERCP in 3 months to remove stent Still with significant biliary drain output, but this is improving (1850 for 24 hours on 8/14, currently 435 cc since 0700 today) Planned to stop Ceftriaxone /metronidazole  after 8/14 dose, as he has completed 7 days of therapy; however, still with uptrending leukocytosis so will continue and likely dc home on Augmentin  to complete 14 days of therapy   AKI Likely associated with excessive biliary drain output, NPO status for procedure Will gently hydrate, which is anticipated to improve mild hyperkalemia as well Recheck BMP in AM  Multiple liver and pancreatic lesions Noted on dedicated liver MRI Liver lesions  concerning for [facility] in the background of cirrhosis; pathology from 8/13 confirmed 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 Melanee will see today and has also arranged for outpatient f/u on 8/22   Portal venous thrombosis Started on heparin  briefly but this will be held until after ERCP on 8/14 Will plan to start heparin  on 8/15 and transition to Eliquis  on 8/16 if no apparent post-procedure bleeding  Pharmacy says Eliquis  will cost about $115 per month; patient and wife have confirmed with [facility] and want to proceed with Eliquis    Liver cirrhosis with chronic transaminitis/hyperbilirubinemia and chronic thrombocytopenia Unknown etiology, alcoholic?,  NASH? Autoimune? Does not appear to have had a significant ETOH use d/o in his lifetime based on today's questioning Appears to be stable, no significant impairment of synthetic function of liver with normal albumin level and INR   CAD Holding aspirin /Plavix  Will likely transition to DOAC therapy and stop anti-platelet therapies   HTN Hold Toprol  XL (bradycardia), Jardiance  (AKI) Resume hydrochlorothiazide /lisinopril  when needed   HLD Resume Zetia, rosuvastatin  at time of dc He has had mild transaminitis and will need f/u LFTs as an outpatient   Hypothyroidism Continue Synthroid , Cytomel    Sinus bradycardia Frequent PVCs Compared to his recent office record, baseline heart rate in 40-60s and blood pressure stable As per recommendation from cardiology at [facility], will continue low-dose metoprolol  BP is marginal today so continue to hold for now and possibly resume on 8/16   Class 1 obesity Body mass index is 32.66  kg/m..  Weight loss should be encouraged Outpatient PCP/bariatric medicine f/u encouraged Significantly low or high BMI is  associated with higher medical risk including morbidity and mortality    Disposition Patient is eager to go home, has a job interview next week at [employer] in [place] Possible dc to home tomorrow pending ongoing improvement in biliary drain output, WBC, and creatinine as well as tolerance of Heparin         Consultants: Cardiology Surgery Oncology   Procedures: Echocardiogram 8/8 Laparoscopic cholecystectomy 8/9 US -guided Liver biopsy (IR) 8/13 ERCP 8/14   Antibiotics: Ceftriaxone  8/8- Metronidazole  8/8-  30 Day Unplanned Readmission Risk Score    Flowsheet Row ED to Hosp-Admission (Current) from 06/08/2024 in [facility]  30 Day Unplanned Readmission Risk Score (%) 15.5 Filed at 06/16/2024 0400    This score is the patient's risk of an unplanned readmission within 30 days of being discharged (0 -100%). The score is based on dignosis, age, lab data, medications, orders, and past utilization.   Low:  0-14.9   Medium: 15-21.9   High: 22-29.9   Extreme: 30 and above           Subjective: Feeling better, no specific complaints, eager to go home when possible.   Objective: Vitals:   06/15/24 1949 06/16/24 0306  BP: 106/75 126/77  Pulse: 73 (!) 44  Resp: 20 20  Temp: 98 F (36.7 C) (!) 97.5 F (36.4 C)  SpO2: 94% 97%    Intake/Output Summary (Last 24 hours) at 06/16/2024 1452 Last data filed at 06/16/2024 1433 Gross per 24 hour  Intake 480 ml  Output 1675 ml  Net -1195 ml   Filed Weights   06/11/24 1500 06/15/24 0500 06/16/24 0436  Weight: 115.4 kg 108.3 kg 110.4 kg    Exam:  General:  Appears calm and comfortable and is in NAD Eyes:  normal lids, iris ENT:  grossly normal hearing, lips & tongue, mmm Cardiovascular:  RRR. No LE edema.  Respiratory:   CTA bilaterally with no wheezes/rales/rhonchi.  Normal respiratory effort. Abdomen:  soft, NT, ND; surgical wounds healing nicely; R-sided JP drain in  place with copious brown drainage Skin:  no rash or induration seen on limited exam; LLQ abdominal ecchymosis noted Musculoskeletal:  grossly normal tone BUE/BLE, good ROM, no bony abnormality Psychiatric:  grossly normal mood and affect, speech fluent and appropriate, AOx3 Neurologic:  CN 2-12 grossly intact, moves all extremities in coordinated fashion  Data Reviewed: I have reviewed the patient's lab results since admission.  Pertinent labs for today include:   K+ 5.4 Glucose 150 BUN 34/Creatinine 1.38/GFR 54 Albumin 2.1 AST 54/ALT 69/Bili 1.4, stable to improved WBC 26.6 Glucose 147     Family Communication: Wife and son were present throughout evaluation  Disposition: Status is: Inpatient Remains inpatient appropriate because: ongoing management     Time spent: 50 minutes  Unresulted Labs (From admission, onward)     Start     Ordered   06/17/24 0500  Procalcitonin  Tomorrow morning,   R       References:    Procalcitonin Lower Respiratory Tract Infection AND Sepsis Procalcitonin Algorithm  Question:  Specimen collection method  Answer:  Lab=Lab collect   06/16/24 1041   06/17/24 0500  Basic metabolic panel with GFR  Tomorrow morning,   R       Question:  Specimen collection method  Answer:  Lab=Lab collect   06/16/24 1317  Author: Delon Herald, MD 06/16/2024 2:52 PM  For on call review [email or web address].

## 2024-06-17 ENCOUNTER — Inpatient Hospital Stay

## 2024-06-17 DIAGNOSIS — K8000 Calculus of gallbladder with acute cholecystitis without obstruction: Secondary | ICD-10-CM | POA: Diagnosis not present

## 2024-06-17 LAB — CBC WITH DIFFERENTIAL/PLATELET
Abs Immature Granulocytes: 1.27 K/uL — ABNORMAL HIGH (ref 0.00–0.07)
Basophils Absolute: 0.3 K/uL — ABNORMAL HIGH (ref 0.0–0.1)
Basophils Relative: 1 %
Eosinophils Absolute: 0.6 K/uL — ABNORMAL HIGH (ref 0.0–0.5)
Eosinophils Relative: 1 %
HCT: 45.5 % (ref 39.0–52.0)
Hemoglobin: 16.1 g/dL (ref 13.0–17.0)
Immature Granulocytes: 3 %
Lymphocytes Relative: 60 %
Lymphs Abs: 27.5 K/uL — ABNORMAL HIGH (ref 0.7–4.0)
MCH: 34.3 pg — ABNORMAL HIGH (ref 26.0–34.0)
MCHC: 35.4 g/dL (ref 30.0–36.0)
MCV: 97 fL (ref 80.0–100.0)
Monocytes Absolute: 2.5 K/uL — ABNORMAL HIGH (ref 0.1–1.0)
Monocytes Relative: 6 %
Neutro Abs: 12.8 K/uL — ABNORMAL HIGH (ref 1.7–7.7)
Neutrophils Relative %: 29 %
Platelets: 186 K/uL (ref 150–400)
RBC: 4.69 MIL/uL (ref 4.22–5.81)
RDW: 15.9 % — ABNORMAL HIGH (ref 11.5–15.5)
Smear Review: NORMAL
WBC: 45 K/uL — ABNORMAL HIGH (ref 4.0–10.5)
nRBC: 0 % (ref 0.0–0.2)

## 2024-06-17 LAB — GLUCOSE, CAPILLARY
Glucose-Capillary: 150 mg/dL — ABNORMAL HIGH (ref 70–99)
Glucose-Capillary: 137 mg/dL — ABNORMAL HIGH (ref 70–99)
Glucose-Capillary: 154 mg/dL — ABNORMAL HIGH (ref 70–99)
Glucose-Capillary: 173 mg/dL — ABNORMAL HIGH (ref 70–99)

## 2024-06-17 LAB — HEPATIC FUNCTION PANEL
ALT: 67 U/L — ABNORMAL HIGH (ref 0–44)
AST: 63 U/L — ABNORMAL HIGH (ref 15–41)
Albumin: 2.3 g/dL — ABNORMAL LOW (ref 3.5–5.0)
Alkaline Phosphatase: 76 U/L (ref 38–126)
Bilirubin, Direct: 0.4 mg/dL — ABNORMAL HIGH (ref 0.0–0.2)
Indirect Bilirubin: 0.9 mg/dL (ref 0.3–0.9)
Total Bilirubin: 1.3 mg/dL — ABNORMAL HIGH (ref 0.0–1.2)
Total Protein: 5.3 g/dL — ABNORMAL LOW (ref 6.5–8.1)

## 2024-06-17 LAB — BASIC METABOLIC PANEL WITH GFR
Anion gap: 7 (ref 5–15)
BUN: 30 mg/dL — ABNORMAL HIGH (ref 8–23)
CO2: 22 mmol/L (ref 22–32)
Calcium: 8.3 mg/dL — ABNORMAL LOW (ref 8.9–10.3)
Chloride: 109 mmol/L (ref 98–111)
Creatinine, Ser: 1.01 mg/dL (ref 0.61–1.24)
GFR, Estimated: >60 mL/min (ref >60)
Glucose, Bld: 137 mg/dL — ABNORMAL HIGH (ref 70–99)
Potassium: 4.3 mmol/L (ref 3.5–5.1)
Sodium: 138 mmol/L (ref 135–145)

## 2024-06-17 LAB — HEPARIN LEVEL (UNFRACTIONATED)
Heparin Unfractionated: 0.88 [IU]/mL — ABNORMAL HIGH (ref 0.30–0.70)
Heparin Unfractionated: 0.73 [IU]/mL — ABNORMAL HIGH (ref 0.30–0.70)
Heparin Unfractionated: 0.48 [IU]/mL (ref 0.30–0.70)

## 2024-06-17 LAB — LIPASE, BLOOD: Lipase: 79 U/L — ABNORMAL HIGH (ref 11–51)

## 2024-06-17 LAB — PROCALCITONIN: Procalcitonin: 0.51 ng/mL

## 2024-06-17 MED ORDER — FLUCONAZOLE IN SODIUM CHLORIDE 400-0.9 MG/200ML-% IV SOLN
800.0000 mg | INTRAVENOUS | Status: DC
  Administered 2024-06-17: 800 mg via INTRAVENOUS
  Administered 2024-06-18 – 2024-06-20 (×4): 400 mg via INTRAVENOUS
  Filled 2024-06-17 (×5): qty 400

## 2024-06-17 MED ORDER — SODIUM CHLORIDE 0.9 % IV SOLN
3.0000 g | Freq: Four times a day (QID) | INTRAVENOUS | Status: DC
  Administered 2024-06-17 – 2024-06-21 (×16): 3 g via INTRAVENOUS
  Filled 2024-06-17 (×18): qty 8

## 2024-06-17 MED ORDER — IOHEXOL 9 MG/ML PO SOLN
500.0000 mL | ORAL | Status: AC
  Administered 2024-06-17 (×2): 500 mL via ORAL

## 2024-06-17 MED ORDER — IOHEXOL 300 MG/ML  SOLN
100.0000 mL | Freq: Once | INTRAMUSCULAR | Status: AC | PRN
  Administered 2024-06-17: 100 mL via INTRAVENOUS

## 2024-06-17 NOTE — Progress Notes (Signed)
 Progress Note   Patient: Nicholas Abbott FMW:[id number] DOB: Oct 23, 1951 DOA: 06/08/2024     8 DOS: the patient was seen and examined on 06/17/2024   Brief hospital course: 73yo with h/o CAD s/p stenting, HTN, HLD, OSA on CPAP, cirrhosis with chronic thrombocytopenia, and class 1 obesity who presented on 8/7 with abdominal pain.  Found to have acute cholecystitis, now s/p lap chole with drain placement.  Had biliary leak and required stent placement.  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  Oncology is also following.  Also with portal venous thrombosis, needs AC.  Assessment and Plan:  Sepsis due to acute cholecystitis, now with bile leak Sepsis physiology met with leukocytosis, fever on presentation; physiology has resolved although he remains with tachycardia Suspected source is infected stone within the gallbladder neck and resultant cholecystitis Status post laparoscopic cholecystectomy on 8/9 JP drain left in place and continues to have copious brown drainage HIDA scan with contained bile leak, likely secondary to the remnant cystic duct portion GI was consulted  ERCP on 8/14 with biliary sphincterotomy and stent placement Will need to be observed for post-ERCP pancreatitis or other complications Will need f/u ERCP in 3 months to remove stent Still with significant biliary drain output, ongoing despite stent Planned to stop Ceftriaxone /metronidazole  after 8/14 dose, as he has completed 7 days of therapy; however, still with uptrending leukocytosis so continuing antibiotics for now WBC today is 45,000, unclear etiology but likely intra-abdominal pathology given ongoing biliary drainage + leukocytosis Will hold dc and order CT A/P Surgery is aware and also closely monitoring Discussed with ID Dr. Luiz - will change antibiotics to Unasyn  to cover Enterococcus + Fluconazole  Dr. Tye will reach out to IR to see if there is anything else to drain; if so, Dr.  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 Melanee has arranged for outpatient f/u on 8/22 (she saw the patient on 8/15 and is planning to present at tumor board, but there is no note available at this time)   Portal venous thrombosis Started on heparin  briefly but this will be held until after ERCP on 8/14 Will plan to start heparin  on 8/15 and transition to Eliquis  once his other issues are more stable Pharmacy says Eliquis  will cost about $115 per month; patient and wife have [facility] with [facility] and want to proceed with Eliquis    Liver cirrhosis with chronic transaminitis/hyperbilirubinemia and chronic thrombocytopenia Unknown etiology, alcoholic?,  NASH? Autoimune? Does not appear to have had a significant ETOH use d/o in his lifetime based on today's questioning Appears to be stable, no significant impairment of synthetic function of liver with normal albumin level and INR   CAD Holding aspirin /Plavix  Will likely transition to DOAC therapy and stop anti-platelet therapies   HTN Hold Toprol  XL (bradycardia), Jardiance  (AKI) Resume hydrochlorothiazide /lisinopril  when needed   HLD Resume Zetia, rosuvastatin  at time of dc He has had mild transaminitis and will need f/u LFTs as an outpatient   Hypothyroidism Continue  Synthroid , Cytomel    Sinus bradycardia Frequent PVCs Compared to his recent office record, baseline heart rate in 40-60s and blood  pressure stable As per recommendation from cardiology at [facility], will continue low-dose metoprolol  BP is marginal today so continue to hold for now and possibly resume on 8/16   Class 1 obesity Body mass index is 32.66 kg/m.SABRA  Weight loss should be encouraged Outpatient PCP/bariatric medicine f/u encouraged Significantly low or high BMI is associated with higher medical risk including morbidity and mortality     Disposition Patient is eager to go home, has a job interview next week at [employer] in [place] Possible dc to home tomorrow pending ongoing improvement in biliary drain output, WBC, and creatinine as well as tolerance of Heparin           Consultants: Cardiology Surgery Oncology   Procedures: Echocardiogram 8/8 Laparoscopic cholecystectomy 8/9 US -guided Liver biopsy (IR) 8/13 ERCP 8/14   Antibiotics: Ceftriaxone  8/8- Metronidazole  8/8-   30 Day Unplanned Readmission Risk Score    Flowsheet Row ED to Hosp-Admission (Current) from 06/08/2024 in [facility]  30 Day Unplanned Readmission Risk Score (%) 18.59 Filed at 06/17/2024 0800    This score is the patient's risk of an unplanned readmission within 30 days of being discharged (0 -100%). The score is based on dignosis, age, lab data, medications, orders, and past utilization.   Low:  0-14.9   Medium: 15-21.9   High: 22-29.9   Extreme: 30 and above           Subjective: feels fine.  Had B shoulder pain overnight but no significant abdominal pain.   Objective: Vitals:   06/17/24 0314 06/17/24 0825  BP: 128/77 125/69  Pulse:  64  Resp: 16 18  Temp: 97.9 F (36.6 C) 97.7 F (36.5 C)  SpO2: 98% 95%    Intake/Output Summary (Last 24 hours) at 06/17/2024 1344 Last data filed at 06/17/2024 1231 Gross per 24 hour  Intake 240 ml  Output 2020 ml  Net -1780 ml   Filed Weights   06/16/24 0436 06/16/24 1500 06/17/24 0314  Weight: 110.4 kg 110.4 kg  104.1 kg    Exam:  General:  Appears calm and comfortable and is in NAD Eyes:  normal lids, iris ENT:  grossly normal hearing, lips & tongue, mmm Cardiovascular:  RRR. No LE edema.  Respiratory:   CTA bilaterally with no wheezes/rales/rhonchi.  Normal respiratory effort. Abdomen:  soft, NT, ND; surgical wounds healing nicely; R-sided JP drain in place with copious brown drainage Skin:  no rash or induration seen on limited exam; LLQ abdominal ecchymosis noted Musculoskeletal:  grossly normal tone BUE/BLE, good ROM, no bony abnormality Psychiatric:  grossly normal mood and affect, speech fluent and appropriate, AOx3 Neurologic:  CN 2-12 grossly intact, moves all extremities in coordinated fashion  Data Reviewed: I have reviewed the patient's lab results since admission.  Pertinent labs for today include:   Glucose 137 Procalcitonin 0.51 WBC 45     Family Communication: None present; I spoke with his wife by telephone  Disposition: Status is: Inpatient Remains inpatient appropriate because: ongoing management     Time spent: 50 minutes  Unresulted Labs (From admission, onward)     Start     Ordered   06/18/24 0500  CBC  Tomorrow morning,   R       Question:  Specimen collection method  Answer:  Lab=Lab collect   06/17/24 1214   06/18/24 0500  Comprehensive metabolic panel with GFR  Tomorrow morning,   R       Question:  Specimen collection method  Answer:  Lab=Lab collect   06/17/24 1344   06/17/24 2030  Heparin  level (unfractionated)  Once-Timed,   TIMED       Question:  Specimen collection method  Answer:  Lab=Lab collect   06/17/24 1214             Author: Delon Herald, MD 06/17/2024 1:44 PM  For on call review [email or web address].

## 2024-06-17 NOTE — Progress Notes (Signed)
 Subjective:  CC: Nicholas Abbott is a 73 y.o. male  Hospital stay day 8, 2 Days Post-Op S/p ERCP with stent for biliary leak,  following robotic subtotal cholecystectomy with reconstitution for acute calculus cholecystitis. New dx HCC.  HPI: No complaints overnight.  ROS:  Pertinent positives and negatives noted in the HPI.    Objective:   Temp:  [97.7 F (36.5 C)-97.9 F (36.6 C)] 97.7 F (36.5 C) (08/16 0825) Pulse Rate:  [64-66] 64 (08/16 0825) Resp:  [16-19] 18 (08/16 0825) BP: (123-128)/(68-77) 125/69 (08/16 0825) SpO2:  [95 %-98 %] 95 % (08/16 0825) Weight:  [104.1 kg-110.4 kg] 104.1 kg (08/16 0314)     Height: 6' 2 (188 cm) Weight: 104.1 kg BMI (Calculated): 29.45   Intake/Output this shift:   Intake/Output Summary (Last 24 hours) at 06/17/2024 1303 Last data filed at 06/17/2024 1231 Gross per 24 hour  Intake 240 ml  Output 2020 ml  Net -1780 ml    Constitutional :  alert, cooperative, appears stated age, and no distress  Respiratory:  Clear to auscultation bilaterally  Cardiovascular:  Regular rate and rhythm  Gastrointestinal: soft, non-tender; bowel sounds normal; no masses,  no organomegaly and incisions c/d/I.  Bile output slight amber color in JP.   Skin: Cool and moist.   Psychiatric: Normal affect, non-agitated, not confused       LABS:     Latest Ref Rng & Units 06/17/2024    4:37 AM 06/16/2024    3:38 AM 06/15/2024    4:17 AM  CMP  Glucose 70 - 99 mg/dL 862  849  885   BUN 8 - 23 mg/dL 30  34  24   Creatinine 0.61 - 1.24 mg/dL 8.98  8.61  9.04   Sodium 135 - 145 mmol/L 138  136  140   Potassium 3.5 - 5.1 mmol/L 4.3  5.4  4.4   Chloride 98 - 111 mmol/L 109  105  109   CO2 22 - 32 mmol/L 22  23  22    Calcium  8.9 - 10.3 mg/dL 8.3  8.6  8.3   Total Protein 6.5 - 8.1 g/dL 5.3  5.0  5.1   Total Bilirubin 0.0 - 1.2 mg/dL 1.3  1.4  1.6   Alkaline Phos 38 - 126 U/L 76  64  65   AST 15 - 41 U/L 63  54  58   ALT 0 - 44 U/L 67  69  71       Latest Ref  Rng & Units 06/17/2024    4:37 AM 06/16/2024    3:38 AM 06/15/2024    4:17 AM  CBC  WBC 4.0 - 10.5 K/uL 45.0  26.6  24.7   Hemoglobin 13.0 - 17.0 g/dL 83.8  84.8  85.0   Hematocrit 39.0 - 52.0 % 45.5  44.0  42.7   Platelets 150 - 400 K/uL 186  147  133     RADS: Pending CT read Assessment:   S/p ERCP with stent for biliary leak,  following robotic subtotal cholecystectomy with reconstitution for acute calculus cholecystitis. New dx HCC.  Clinically stable but leukocytosis continues to increase.  Image review with areas of fluid collection.  Pending final read, but likely may need IR drains.  Will discuss once read is available.  Continue current management for now, since there should not be any more new bile leak post ERCP.  Persistent large fluid output maybe fix of ascites fluid buildup from [facility] with  residual bile??  labs/images/medications/previous chart entries reviewed personally and relevant changes/updates noted above.

## 2024-06-17 NOTE — Plan of Care (Signed)
  Problem: Education: Goal: Knowledge of General Education information will improve Description: Including pain rating scale, medication(s)/side effects and non-pharmacologic comfort measures Outcome: Progressing   Problem: Clinical Measurements: Goal: Ability to maintain clinical measurements within normal limits will improve Outcome: Progressing Goal: Will remain free from infection Outcome: Progressing Goal: Diagnostic test results will improve Outcome: Progressing Goal: Cardiovascular complication will be avoided Outcome: Progressing   Problem: Activity: Goal: Risk for activity intolerance will decrease Outcome: Progressing   Problem: Coping: Goal: Level of anxiety will decrease Outcome: Progressing   Problem: Elimination: Goal: Will not experience complications related to bowel motility Outcome: Progressing Goal: Will not experience complications related to urinary retention Outcome: Progressing   Problem: Pain Managment: Goal: General experience of comfort will improve and/or be controlled Outcome: Progressing   Problem: Safety: Goal: Ability to remain free from injury will improve Outcome: Progressing   Problem: Skin Integrity: Goal: Risk for impaired skin integrity will decrease Outcome: Progressing

## 2024-06-17 NOTE — Consult Note (Deleted)
 PHARMACY - ANTICOAGULATION CONSULT NOTE  Pharmacy Consult for heparin  Indication: portal vein thrombosis  Allergies  Allergen Reactions   Pioglitazone Rash    Patient Measurements: Height: 6' 2 (188 cm) Weight: 104.1 kg (229 lb 8 oz) IBW/kg (Calculated) : 82.2 HEPARIN  DW (KG): 105  Vital Signs: Temp: 98.8 F (37.1 C) (08/16 1948) BP: 114/75 (08/16 1948) Pulse Rate: 70 (08/16 1948)  Labs: Recent Labs    06/15/24 0417 06/16/24 0338 06/16/24 1542 06/17/24 0049 06/17/24 0437 06/17/24 1101 06/17/24 2048  HGB 14.9 15.1  --   --  16.1  --   --   HCT 42.7 44.0  --   --  45.5  --   --   PLT 133* 147*  --   --  186  --   --   APTT  --   --  31  --   --   --   --   LABPROT  --   --  17.0*  --   --   --   --   INR  --   --  1.3*  --   --   --   --   HEPARINUNFRC  --   --   --  0.88*  --  0.73* 0.48  CREATININE 0.95 1.38*  --   --  1.01  --   --     Estimated Creatinine Clearance: 82.6 mL/min (by C-G formula based on SCr of 1.01 mg/dL).   Medical History: Past Medical History:  Diagnosis Date   Basal cell carcinoma    L ear, txted in past   Diabetes mellitus without complication (HCC)    GERD (gastroesophageal reflux disease)    Hyperlipidemia    Hypertension    Left shoulder pain    Myocardial infarction (HCC)     Medications:  No chronic AC PTA PTA aspirin  81mg   Assessment: Patient is 73yom admitted for abdominal pain on 8/7 and PMH of HTN, HLD, diabetes, CAD s/p stenting, OSA on CPAP, obesity (BMI 32.7), chronic thrombocytopenia, and liver cirrhosis. S/p cholecystectomy on 8/9, then liver biopsy on 8/13, then ERCP on 8/14. Biopsy from 8/13 showed hepatocellular carcinoma.  Imaging this admission notable for acute non-occlusive left portal vein thrombus. Patient was initiated on heparin  on 8/13 for portal vein thrombosis but anticoagulation was then held while patient underwent biopsy and ERCP. Pharmacy has now been consulted to resume heparin  with tentative  plans to switch to DOAC on 8/16.  8/16:  HL @ 0049 = 0.88, elevated 8/16 1101 HL 0.73,  slightly supratherapeutic 8/16:  Hl @ 2048 = 0.48, therapeutic X 1   Goal of Therapy:  Heparin  level 0.3-0.7 units/ml Monitor platelets by anticoagulation protocol: Yes   Plan:  8/17:  HL @ 2048 = 0.48, therapeutic X 1 - continue pt on current rate and recheck HL in 8 hrs.  - CBC daily   Shamaine Mulkern D Clinical Pharmacist 06/17/2024

## 2024-06-17 NOTE — Consult Note (Signed)
 PHARMACY - ANTICOAGULATION CONSULT NOTE  Pharmacy Consult for heparin  Indication: portal vein thrombosis  Allergies  Allergen Reactions   Pioglitazone Rash    Patient Measurements: Height: 6' 2 (188 cm) Weight: 104.1 kg (229 lb 8 oz) IBW/kg (Calculated) : 82.2 HEPARIN  DW (KG): 105  Vital Signs: Temp: 97.7 F (36.5 C) (08/16 0825) Temp Source: Oral (08/16 0825) BP: 125/69 (08/16 0825) Pulse Rate: 64 (08/16 0825)  Labs: Recent Labs    06/15/24 0417 06/16/24 0338 06/16/24 1542 06/17/24 0049 06/17/24 0437 06/17/24 1101  HGB 14.9 15.1  --   --  16.1  --   HCT 42.7 44.0  --   --  45.5  --   PLT 133* 147*  --   --  186  --   APTT  --   --  31  --   --   --   LABPROT  --   --  17.0*  --   --   --   INR  --   --  1.3*  --   --   --   HEPARINUNFRC  --   --   --  0.88*  --  0.73*  CREATININE 0.95 1.38*  --   --  1.01  --     Estimated Creatinine Clearance: 82.6 mL/min (by C-G formula based on SCr of 1.01 mg/dL).   Medical History: Past Medical History:  Diagnosis Date   Basal cell carcinoma    L ear, txted in past   Diabetes mellitus without complication (HCC)    GERD (gastroesophageal reflux disease)    Hyperlipidemia    Hypertension    Left shoulder pain    Myocardial infarction (HCC)     Medications:  No chronic AC PTA PTA aspirin  81mg   Assessment: Patient is 73yom admitted for abdominal pain on 8/7 and PMH of HTN, HLD, diabetes, CAD s/p stenting, OSA on CPAP, obesity (BMI 32.7), chronic thrombocytopenia, and liver cirrhosis. S/p cholecystectomy on 8/9, then liver biopsy on 8/13, then ERCP on 8/14. Biopsy from 8/13 showed hepatocellular carcinoma.  Imaging this admission notable for acute non-occlusive left portal vein thrombus. Patient was initiated on heparin  on 8/13 for portal vein thrombosis but anticoagulation was then held while patient underwent biopsy and ERCP. Pharmacy has now been consulted to resume heparin  with tentative plans to switch to DOAC on  8/16.  8/16:  HL @ 0049 = 0.88, elevated 8/16 1101 HL 0.73,  slightly supratherapeutic  Goal of Therapy:  Heparin  level 0.3-0.7 units/ml Monitor platelets by anticoagulation protocol: Yes   Plan:  8/16 1101 HL 0.73,  slightly supratherapeutic - will decrease heparin  drip rate to 1450 units/hr and recheck HL 8 hrs after rate change - CBC daily   Allean Haas PharmD Clinical Pharmacist 06/17/2024

## 2024-06-17 NOTE — Progress Notes (Signed)
 Pharmacy Antibiotic Note  Nicholas Abbott is a 73 y.o. male w/ PMH of PMH of HTN, HLD, diabetes, CAD s/p stenting, OSA on CPAP, obesity (BMI 32.7), chronic thrombocytopenia, and liver cirrhosis admitted on 06/08/2024 with  acute calculus cholecystitis. New dx HCC.  Pharmacy has been consulted for fluconazole  dosing.  Plan: start fluconazole  800 mg IV every 24 hours  Height: 6' 2 (188 cm) Weight: 104.1 kg (229 lb 8 oz) IBW/kg (Calculated) : 82.2  Temp (24hrs), Avg:97.8 F (36.6 C), Min:97.7 F (36.5 C), Max:97.9 F (36.6 C)  Recent Labs  Lab 06/13/24 0526 06/14/24 0614 06/15/24 0417 06/16/24 0338 06/17/24 0437  WBC 20.9* 21.4* 24.7* 26.6* 45.0*  CREATININE 0.96 1.00 0.95 1.38* 1.01    Estimated Creatinine Clearance: 82.6 mL/min (by C-G formula based on SCr of 1.01 mg/dL).    Allergies  Allergen Reactions   Pioglitazone Rash    Antimicrobials this admission: 08/08 ceftriaxone  >> 08/16 08/08 metronidazole  >> 08/16 08/16 Unasyn  >> 08/16 fluconazole  >>  Microbiology results: 08/08 BCx: NG final 08/08 MRSA PCR: not detected  Thank you for allowing pharmacy to be a part of this patient's care.  Nicholas Abbott 06/17/2024 1:42 PM

## 2024-06-17 NOTE — Plan of Care (Signed)

## 2024-06-17 NOTE — Consult Note (Signed)
 PHARMACY - ANTICOAGULATION CONSULT NOTE  Pharmacy Consult for heparin  Indication: portal vein thrombosis  Allergies  Allergen Reactions   Pioglitazone Rash    Patient Measurements: Height: 6' 2 (188 cm) Weight: 110.4 kg (243 lb 6.2 oz) IBW/kg (Calculated) : 82.2 HEPARIN  DW (KG): 105  Vital Signs: Temp: 97.8 F (36.6 C) (08/15 1919) Temp Source: Oral (08/15 1919) BP: 126/76 (08/15 1919) Pulse Rate: 64 (08/15 1919)  Labs: Recent Labs    06/14/24 0614 06/15/24 0417 06/16/24 0338 06/16/24 1542 06/17/24 0049  HGB 14.5 14.9 15.1  --   --   HCT 42.4 42.7 44.0  --   --   PLT 136* 133* 147*  --   --   APTT 33  --   --  31  --   LABPROT 15.9*  --   --  17.0*  --   INR 1.2  --   --  1.3*  --   HEPARINUNFRC  --   --   --   --  0.88*  CREATININE 1.00 0.95 1.38*  --   --     Estimated Creatinine Clearance: 62.1 mL/min (A) (by C-G formula based on SCr of 1.38 mg/dL (H)).   Medical History: Past Medical History:  Diagnosis Date   Basal cell carcinoma    L ear, txted in past   Diabetes mellitus without complication (HCC)    GERD (gastroesophageal reflux disease)    Hyperlipidemia    Hypertension    Left shoulder pain    Myocardial infarction (HCC)     Medications:  No chronic AC PTA PTA aspirin  81mg   Assessment: Patient is 73yom admitted for abdominal pain on 8/7 and PMH of HTN, HLD, diabetes, CAD s/p stenting, OSA on CPAP, obesity (BMI 32.7), chronic thrombocytopenia, and liver cirrhosis. S/p cholecystectomy on 8/9, then liver biopsy on 8/13, then ERCP on 8/14. Biopsy from 8/13 showed hepatocellular carcinoma.  Imaging this admission notable for acute non-occlusive left portal vein thrombus. Patient was initiated on heparin  on 8/13 for portal vein thrombosis but anticoagulation was then held while patient underwent biopsy and ERCP. Pharmacy has now been consulted to resume heparin  with tentative plans to switch to DOAC on 8/16.  Goal of Therapy:  Heparin  level  0.3-0.7 units/ml Monitor platelets by anticoagulation protocol: Yes   Plan:  8/16:  HL @ 0049 = 0.88, elevated - will decrease heparin  drip rate to 1550 units/hr and recheck HL 8 hrs after rate change - CBC daily   Gricelda Foland D 06/17/2024,2:02 AM

## 2024-06-17 NOTE — Consult Note (Signed)
 PHARMACY - ANTICOAGULATION CONSULT NOTE  Pharmacy Consult for heparin  Indication: portal vein thrombosis  Allergies  Allergen Reactions   Pioglitazone Rash    Patient Measurements: Height: 6' 2 (188 cm) Weight: 104.1 kg (229 lb 8 oz) IBW/kg (Calculated) : 82.2 HEPARIN  DW (KG): 105  Vital Signs: Temp: 98.8 F (37.1 C) (08/16 1948) BP: 114/75 (08/16 1948) Pulse Rate: 70 (08/16 1948)  Labs: Recent Labs    06/15/24 0417 06/16/24 0338 06/16/24 1542 06/17/24 0049 06/17/24 0437 06/17/24 1101 06/17/24 2048  HGB 14.9 15.1  --   --  16.1  --   --   HCT 42.7 44.0  --   --  45.5  --   --   PLT 133* 147*  --   --  186  --   --   APTT  --   --  31  --   --   --   --   LABPROT  --   --  17.0*  --   --   --   --   INR  --   --  1.3*  --   --   --   --   HEPARINUNFRC  --   --   --  0.88*  --  0.73* 0.48  CREATININE 0.95 1.38*  --   --  1.01  --   --     Estimated Creatinine Clearance: 82.6 mL/min (by C-G formula based on SCr of 1.01 mg/dL).   Medical History: Past Medical History:  Diagnosis Date   Basal cell carcinoma    L ear, txted in past   Diabetes mellitus without complication (HCC)    GERD (gastroesophageal reflux disease)    Hyperlipidemia    Hypertension    Left shoulder pain    Myocardial infarction (HCC)     Medications:  No chronic AC PTA PTA aspirin  81mg   Assessment: Patient is 73yom admitted for abdominal pain on 8/7 and PMH of HTN, HLD, diabetes, CAD s/p stenting, OSA on CPAP, obesity (BMI 32.7), chronic thrombocytopenia, and liver cirrhosis. S/p cholecystectomy on 8/9, then liver biopsy on 8/13, then ERCP on 8/14. Biopsy from 8/13 showed hepatocellular carcinoma.  Imaging this admission notable for acute non-occlusive left portal vein thrombus. Patient was initiated on heparin  on 8/13 for portal vein thrombosis but anticoagulation was then held while patient underwent biopsy and ERCP. Pharmacy has now been consulted to resume heparin  with tentative  plans to switch to DOAC on 8/16.  8/16:  HL @ 0049 = 0.88, elevated 8/16 1101 HL 0.73,  slightly supratherapeutic 8/16 2048 HL 0.48, therapeutic x 1  Goal of Therapy:  Heparin  level 0.3-0.7 units/ml Monitor platelets by anticoagulation protocol: Yes   Plan:  8/16 2048 HL 0.48, therapeutic x 1 - will continue heparin  drip rate at1450 units/hr and recheck HL 8 hrs for confirmation - CBC daily   Olam Fritter, PharmD, BCPS 06/17/2024 9:51 PM

## 2024-06-18 DIAGNOSIS — K8000 Calculus of gallbladder with acute cholecystitis without obstruction: Secondary | ICD-10-CM | POA: Diagnosis not present

## 2024-06-18 LAB — COMPREHENSIVE METABOLIC PANEL WITH GFR
ALT: 55 U/L — ABNORMAL HIGH (ref 0–44)
AST: 41 U/L (ref 15–41)
Albumin: 1.9 g/dL — ABNORMAL LOW (ref 3.5–5.0)
Alkaline Phosphatase: 58 U/L (ref 38–126)
Anion gap: 5 (ref 5–15)
BUN: 23 mg/dL (ref 8–23)
CO2: 22 mmol/L (ref 22–32)
Calcium: 8.1 mg/dL — ABNORMAL LOW (ref 8.9–10.3)
Chloride: 108 mmol/L (ref 98–111)
Creatinine, Ser: 0.84 mg/dL (ref 0.61–1.24)
GFR, Estimated: >60 mL/min (ref >60)
Glucose, Bld: 171 mg/dL — ABNORMAL HIGH (ref 70–99)
Potassium: 4.3 mmol/L (ref 3.5–5.1)
Sodium: 135 mmol/L (ref 135–145)
Total Bilirubin: 1.8 mg/dL — ABNORMAL HIGH (ref 0.0–1.2)
Total Protein: 4.6 g/dL — ABNORMAL LOW (ref 6.5–8.1)

## 2024-06-18 LAB — CBC
HCT: 44.6 % (ref 39.0–52.0)
Hemoglobin: 15.8 g/dL (ref 13.0–17.0)
MCH: 35.3 pg — ABNORMAL HIGH (ref 26.0–34.0)
MCHC: 35.4 g/dL (ref 30.0–36.0)
MCV: 99.6 fL (ref 80.0–100.0)
Platelets: 159 K/uL (ref 150–400)
RBC: 4.48 MIL/uL (ref 4.22–5.81)
RDW: 15.9 % — ABNORMAL HIGH (ref 11.5–15.5)
WBC: 42.9 K/uL — ABNORMAL HIGH (ref 4.0–10.5)
nRBC: 0 % (ref 0.0–0.2)

## 2024-06-18 LAB — GLUCOSE, CAPILLARY
Glucose-Capillary: 159 mg/dL — ABNORMAL HIGH (ref 70–99)
Glucose-Capillary: 160 mg/dL — ABNORMAL HIGH (ref 70–99)
Glucose-Capillary: 201 mg/dL — ABNORMAL HIGH (ref 70–99)
Glucose-Capillary: 151 mg/dL — ABNORMAL HIGH (ref 70–99)
Glucose-Capillary: 179 mg/dL — ABNORMAL HIGH (ref 70–99)

## 2024-06-18 LAB — HEPARIN LEVEL (UNFRACTIONATED): Heparin Unfractionated: 0.42 [IU]/mL (ref 0.30–0.70)

## 2024-06-18 NOTE — Consult Note (Signed)
 PHARMACY - ANTICOAGULATION CONSULT NOTE  Pharmacy Consult for heparin  Indication: portal vein thrombosis  Allergies  Allergen Reactions   Pioglitazone Rash    Patient Measurements: Height: 6' 2 (188 cm) Weight: 104.1 kg (229 lb 8 oz) IBW/kg (Calculated) : 82.2 HEPARIN  DW (KG): 105  Vital Signs: Temp: 98.8 F (37.1 C) (08/16 1948) BP: 114/75 (08/16 1948) Pulse Rate: 70 (08/16 1948)  Labs: Recent Labs    06/16/24 0338 06/16/24 1542 06/17/24 0049 06/17/24 0437 06/17/24 1101 06/17/24 2048 06/18/24 0604  HGB 15.1  --   --  16.1  --   --  15.8  HCT 44.0  --   --  45.5  --   --  44.6  PLT 147*  --   --  186  --   --  159  APTT  --  31  --   --   --   --   --   LABPROT  --  17.0*  --   --   --   --   --   INR  --  1.3*  --   --   --   --   --   HEPARINUNFRC  --   --    < >  --  0.73* 0.48 0.42  CREATININE 1.38*  --   --  1.01  --   --   --    < > = values in this interval not displayed.    Estimated Creatinine Clearance: 82.6 mL/min (by C-G formula based on SCr of 1.01 mg/dL).   Medical History: Past Medical History:  Diagnosis Date   Basal cell carcinoma    L ear, txted in past   Diabetes mellitus without complication (HCC)    GERD (gastroesophageal reflux disease)    Hyperlipidemia    Hypertension    Left shoulder pain    Myocardial infarction (HCC)     Medications:  No chronic AC PTA PTA aspirin  81mg   Assessment: Patient is 73yom admitted for abdominal pain on 8/7 and PMH of HTN, HLD, diabetes, CAD s/p stenting, OSA on CPAP, obesity (BMI 32.7), chronic thrombocytopenia, and liver cirrhosis. S/p cholecystectomy on 8/9, then liver biopsy on 8/13, then ERCP on 8/14. Biopsy from 8/13 showed hepatocellular carcinoma.  Imaging this admission notable for acute non-occlusive left portal vein thrombus. Patient was initiated on heparin  on 8/13 for portal vein thrombosis but anticoagulation was then held while patient underwent biopsy and ERCP. Pharmacy has now been  consulted to resume heparin  with tentative plans to switch to DOAC on 8/16.  8/16:  HL @ 0049 = 0.88, elevated 8/16 1101 HL 0.73,  slightly supratherapeutic 8/16 2048 HL 0.48, therapeutic x 1 8/17 0604 HL 0.42, therapeutic x 2   Goal of Therapy:  Heparin  level 0.3-0.7 units/ml Monitor platelets by anticoagulation protocol: Yes   Plan:  8/17:  HL @ 0604 = 0.42, therapeutic  - will continue heparin  drip rate at1450 units/hr and recheck HL on 8/18 with AM labs.  - CBC daily   Jaileigh Weimer D 06/18/2024 6:59 AM

## 2024-06-18 NOTE — Progress Notes (Signed)
 Progress Note   Patient: Nicholas Abbott FMW:[id number] DOB: [birth year] DOA: 06/08/2024     9 DOS: the patient was seen and examined on 06/18/2024   Brief hospital course: 73yo with h/o CAD s/p stenting, HTN, HLD, OSA on CPAP, cirrhosis with chronic thrombocytopenia, and class 1 obesity who presented on 8/7 with abdominal pain.  Found to have acute cholecystitis, now s/p lap chole with drain placement.  Had biliary leak and required stent placement.  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  Oncology is also following.  Also with portal venous thrombosis, needs AC.  Assessment and Plan:  Sepsis due to acute cholecystitis, now with bile leak Sepsis physiology on presentation, has resolved Suspected source cholecystitis, s/p lap chole on 8/9 JP drain left in place and continues to have copious brown drainage HIDA scan with contained bile leak, likely secondary to the remnant cystic duct portion GI was consulted  ERCP on 8/14 with biliary sphincterotomy and stent placement; will need f/u ERCP in 3 months to remove stent Still with significant biliary drain output, ongoing despite stent Planned to stop Ceftriaxone /metronidazole  after 8/14 dose, as he has completed 7 days of therapy; however, still with uptrending leukocytosis so continuing antibiotics for now WBC peaked on 8/16 at 45,000, unclear etiology but likely intra-abdominal pathology given ongoing biliary drainage + leukocytosis; appears to be downtrending Surgery is aware and also closely monitoring Discussed with ID Dr. Luiz - will change antibiotics to Unasyn  to cover Enterococcus + Fluconazole  Dr. Tye will reach out to IR to see if there is anything else to drain; if so, Dr. 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 Melanee has arranged for outpatient f/u on 8/22 (she saw the patient on 8/15 and is planning to present at tumor board, but there is no note available at this time)   Portal venous thrombosis Started on heparin  briefly but this will be held until after ERCP on 8/14 Started heparin  on 8/15; will transition to Eliquis  once his other issues are more stable Pharmacy says Eliquis  will cost about $115 per month; patient and wife have confirmed with [facility] and want to proceed with Eliquis    Liver cirrhosis with chronic transaminitis/hyperbilirubinemia and chronic thrombocytopenia Unknown etiology, alcoholic?,  NASH? Autoimune? Does not appear to have had a significant ETOH use d/o in his lifetime so that is unlikely etiology Appears to be stable, no significant impairment of synthetic function of liver with normal albumin level and INR   CAD Holding aspirin /Plavix  Will likely transition to DOAC therapy and stop anti-platelet therapies   HTN Hold Toprol  XL (bradycardia), Jardiance  (AKI), hydrochlorothiazide /lisinopril   Resume when needed/indicated   HLD Resume Zetia, rosuvastatin  at time of dc He has had mild transaminitis and will need f/u LFTs as an outpatient   Hypothyroidism Continue Synthroid , Cytomel    Sinus bradycardia Frequent PVCs Compared to his recent office record, baseline heart rate in 40-60s and blood pressure stable BP max 126/72 and HR 72 so will continue to hold metoprolol  for now ([facility] recommends resumption when appropriate)   Disposition Patient is eager to go home, has a job  interview this week at [employer] in [place] Needs improvement in biliary drain output, WBC as well as tolerance of Heparin  prior to dc IR is planning to  consult tomorrow           Consultants: Cardiology Surgery Oncology   Procedures: Echocardiogram 8/8 Laparoscopic cholecystectomy 8/9 US -guided Liver biopsy (IR) 8/13 ERCP 8/14   Antibiotics: Ceftriaxone  8/8- Metronidazole  8/8-    Subjective: Feeling better, no new concerns.  Aware that he needs to remain hospitalized at this time.   Objective: Vitals:   06/17/24 1948 06/18/24 0730  BP:  126/73  Pulse:  72  Resp:  17  Temp:  98.3 F (36.8 C)  SpO2: 94% 96%    Intake/Output Summary (Last 24 hours) at 06/18/2024 1442 Last data filed at 06/18/2024 1413 Gross per 24 hour  Intake 60 ml  Output 1690 ml  Net -1630 ml   Filed Weights   06/16/24 0436 06/16/24 1500 06/17/24 0314  Weight: 110.4 kg 110.4 kg 104.1 kg    Exam:  General:  Appears calm and comfortable and is in NAD Eyes:  normal lids, iris ENT:  grossly normal hearing, lips & tongue, mmm Cardiovascular:  RRR. No LE edema.  Respiratory:   CTA bilaterally with no wheezes/rales/rhonchi.  Normal respiratory effort. Abdomen:  soft, NT, ND; surgical wounds healing nicely; R-sided JP drain in place with copious brown drainage that appears to be lighter in color than prior Skin:  no rash or induration seen on limited exam; LLQ abdominal ecchymosis noted Musculoskeletal:  grossly normal tone BUE/BLE, good ROM, no bony abnormality Psychiatric:  grossly normal mood and affect, speech fluent and appropriate, AOx3 Neurologic:  CN 2-12 grossly intact, moves all extremities in coordinated fashion  Data Reviewed: I have reviewed the patient's lab results since admission.  Pertinent labs for today include:   Glucose 171 Albumin 1.9 AST 41/ALT 55/Bili 1.8 WBC 42.9     Family Communication: Wife, brother, and SIL were present throughout evaluation  Disposition: Status is: Inpatient Remains inpatient appropriate because: ongoing management     Time spent: 50 minutes  Unresulted Labs (From admission,  onward)     Start     Ordered   06/19/24 0500  Heparin  level (unfractionated)  Tomorrow morning,   R       Question:  Specimen collection method  Answer:  Lab=Lab collect   06/18/24 0659   06/19/24 0500  CBC  Tomorrow morning,   R       Question:  Specimen collection method  Answer:  Lab=Lab collect   06/18/24 1435   Unscheduled  Comprehensive metabolic panel with GFR  Tomorrow morning,   R       Question:  Specimen collection method  Answer:  Lab=Lab collect   06/18/24 1442             Author: Delon Herald, MD 06/18/2024 2:42 PM  For on call review [email or web address].

## 2024-06-18 NOTE — Progress Notes (Addendum)
 Subjective:  CC: Nicholas Abbott is a 73 y.o. male  Hospital stay day 9, 3 Days Post-Op S/p ERCP with stent for biliary leak,  following robotic subtotal cholecystectomy with reconstitution for acute calculus cholecystitis. New dx HCC.  HPI: No complaints overnight again.   ROS:  Pertinent positives and negatives noted in the HPI.    Objective:   Temp:  [98.3 F (36.8 C)-98.8 F (37.1 C)] 98.3 F (36.8 C) (08/17 0730) Pulse Rate:  [70-77] 72 (08/17 0730) Resp:  [15-17] 17 (08/17 0730) BP: (113-126)/(73-75) 126/73 (08/17 0730) SpO2:  [93 %-96 %] 96 % (08/17 0730) FiO2 (%):  [21 %] 21 % (08/16 1914)     Height: 6' 2 (188 cm) Weight: 104.1 kg BMI (Calculated): 29.45   Intake/Output this shift:   Intake/Output Summary (Last 24 hours) at 06/18/2024 1020 Last data filed at 06/18/2024 0900 Gross per 24 hour  Intake 60 ml  Output 1805 ml  Net -1745 ml    Constitutional :  alert, cooperative, appears stated age, and no distress  Respiratory:  Clear to auscultation bilaterally  Cardiovascular:  Regular rate and rhythm  Gastrointestinal: soft, non-tender; bowel sounds normal; no masses,  no organomegaly and incisions c/d/I.  Bile output slight amber color in JP.  No change in characteristics compared to yesterday..   Skin: Cool and moist.   Psychiatric: Normal affect, non-agitated, not confused       LABS:     Latest Ref Rng & Units 06/18/2024    6:04 AM 06/17/2024    4:37 AM 06/16/2024    3:38 AM  CMP  Glucose 70 - 99 mg/dL 828  862  849   BUN 8 - 23 mg/dL 23  30  34   Creatinine 0.61 - 1.24 mg/dL 9.15  8.98  8.61   Sodium 135 - 145 mmol/L 135  138  136   Potassium 3.5 - 5.1 mmol/L 4.3  4.3  5.4   Chloride 98 - 111 mmol/L 108  109  105   CO2 22 - 32 mmol/L 22  22  23    Calcium  8.9 - 10.3 mg/dL 8.1  8.3  8.6   Total Protein 6.5 - 8.1 g/dL 4.6  5.3  5.0   Total Bilirubin 0.0 - 1.2 mg/dL 1.8  1.3  1.4   Alkaline Phos 38 - 126 U/L 58  76  64   AST 15 - 41 U/L 41  63  54    ALT 0 - 44 U/L 55  67  69       Latest Ref Rng & Units 06/18/2024    6:04 AM 06/17/2024    4:37 AM 06/16/2024    3:38 AM  CBC  WBC 4.0 - 10.5 K/uL 42.9  45.0  26.6   Hemoglobin 13.0 - 17.0 g/dL 84.1  83.8  84.8   Hematocrit 39.0 - 52.0 % 44.6  45.5  44.0   Platelets 150 - 400 K/uL 159  186  147     RADS: Pending CT read Assessment:   S/p ERCP with stent for biliary leak,  following robotic subtotal cholecystectomy with reconstitution for acute calculus cholecystitis. New dx HCC.  Remains clinically stable.  Leukocytosis with slight decrease now with antibiotic adjustment.  Discussed CT results with IR and they will attempt drainage on Monday.  Patient will need to be n.p.o. and heparin  drip will need to be discontinued in accordance with IR recommendations.  Continue current management for now, since there should  not be any more new bile leak post ERCP.  Persistent large fluid output maybe ascites fluid buildup from [facility] with residual bile??  labs/images/medications/previous chart entries reviewed personally and relevant changes/updates noted above.

## 2024-06-19 ENCOUNTER — Inpatient Hospital Stay: Admitting: Radiology

## 2024-06-19 DIAGNOSIS — K8000 Calculus of gallbladder with acute cholecystitis without obstruction: Secondary | ICD-10-CM | POA: Diagnosis not present

## 2024-06-19 HISTORY — PX: IR US GUIDE BX ASP/DRAIN: IMG2392

## 2024-06-19 LAB — BODY FLUID CELL COUNT WITH DIFFERENTIAL
Eos, Fluid: 0 %
Lymphs, Fluid: 19 %
Monocyte-Macrophage-Serous Fluid: 9 %
Neutrophil Count, Fluid: 72 %
Total Nucleated Cell Count, Fluid: 3201 uL

## 2024-06-19 LAB — HEPARIN LEVEL (UNFRACTIONATED): Heparin Unfractionated: 0.31 [IU]/mL (ref 0.30–0.70)

## 2024-06-19 LAB — COMPREHENSIVE METABOLIC PANEL WITH GFR
ALT: 48 U/L — ABNORMAL HIGH (ref 0–44)
AST: 42 U/L — ABNORMAL HIGH (ref 15–41)
Albumin: 1.8 g/dL — ABNORMAL LOW (ref 3.5–5.0)
Alkaline Phosphatase: 59 U/L (ref 38–126)
Anion gap: 9 (ref 5–15)
BUN: 26 mg/dL — ABNORMAL HIGH (ref 8–23)
CO2: 25 mmol/L (ref 22–32)
Calcium: 8.4 mg/dL — ABNORMAL LOW (ref 8.9–10.3)
Chloride: 104 mmol/L (ref 98–111)
Creatinine, Ser: 1.09 mg/dL (ref 0.61–1.24)
GFR, Estimated: >60 mL/min (ref >60)
Glucose, Bld: 135 mg/dL — ABNORMAL HIGH (ref 70–99)
Potassium: 4.1 mmol/L (ref 3.5–5.1)
Sodium: 138 mmol/L (ref 135–145)
Total Bilirubin: 1.3 mg/dL — ABNORMAL HIGH (ref 0.0–1.2)
Total Protein: 4.7 g/dL — ABNORMAL LOW (ref 6.5–8.1)

## 2024-06-19 LAB — BILIRUBIN, FRACTIONATED(TOT/DIR/INDIR)
Bilirubin, Direct: 0.6 mg/dL — ABNORMAL HIGH (ref 0.0–0.2)
Indirect Bilirubin: 0.7 mg/dL (ref 0.3–0.9)
Total Bilirubin: 1.3 mg/dL — ABNORMAL HIGH (ref 0.0–1.2)

## 2024-06-19 LAB — GLUCOSE, CAPILLARY
Glucose-Capillary: 186 mg/dL — ABNORMAL HIGH (ref 70–99)
Glucose-Capillary: 145 mg/dL — ABNORMAL HIGH (ref 70–99)
Glucose-Capillary: 185 mg/dL — ABNORMAL HIGH (ref 70–99)
Glucose-Capillary: 165 mg/dL — ABNORMAL HIGH (ref 70–99)

## 2024-06-19 LAB — CBC
HCT: 42.8 % (ref 39.0–52.0)
Hemoglobin: 14.5 g/dL (ref 13.0–17.0)
MCH: 34 pg (ref 26.0–34.0)
MCHC: 33.9 g/dL (ref 30.0–36.0)
MCV: 100.5 fL — ABNORMAL HIGH (ref 80.0–100.0)
Platelets: 171 K/uL (ref 150–400)
RBC: 4.26 MIL/uL (ref 4.22–5.81)
RDW: 16 % — ABNORMAL HIGH (ref 11.5–15.5)
WBC: 40.2 K/uL — ABNORMAL HIGH (ref 4.0–10.5)
nRBC: 0 % (ref 0.0–0.2)

## 2024-06-19 LAB — ALBUMIN, PLEURAL OR PERITONEAL FLUID: Albumin, Fluid: <1.5 g/dL

## 2024-06-19 MED ORDER — APIXABAN 5 MG PO TABS
10.0000 mg | ORAL_TABLET | Freq: Two times a day (BID) | ORAL | Status: DC
  Administered 2024-06-19 – 2024-06-21 (×5): 10 mg via ORAL
  Filled 2024-06-19 (×5): qty 2

## 2024-06-19 MED ORDER — LIDOCAINE 1 % OPTIME INJ - NO CHARGE
5.0000 mL | Freq: Once | INTRAMUSCULAR | Status: DC
  Filled 2024-06-19: qty 6

## 2024-06-19 MED ORDER — APIXABAN 5 MG PO TABS: 5.0000 mg | ORAL_TABLET | Freq: Two times a day (BID) | ORAL | Status: DC

## 2024-06-19 NOTE — Procedures (Signed)
 Interventional Radiology Procedure Note  Procedure: US  guided aspiration of perihepatic fluid yielding 190 mL.  Sent for both culture and cytology.   Complications: None  Estimated Blood Loss: None  Recommendations: - Follow lab results  Signed,  Wilkie LOIS Lent, MD

## 2024-06-19 NOTE — Consult Note (Signed)
 PHARMACY - ANTICOAGULATION CONSULT NOTE  Pharmacy Consult for heparin  Indication: portal vein thrombosis  Allergies  Allergen Reactions   Pioglitazone Rash    Patient Measurements: Height: 6' 2 (188 cm) Weight: 104.1 kg (229 lb 8 oz) IBW/kg (Calculated) : 82.2 HEPARIN  DW (KG): 105  Vital Signs: Temp: 98.2 F (36.8 C) (08/18 0444) Temp Source: Oral (08/18 0444) BP: 129/84 (08/18 0444) Pulse Rate: 68 (08/18 0444)  Labs: Recent Labs    06/16/24 1542 06/17/24 0049 06/17/24 0437 06/17/24 1101 06/17/24 2048 06/18/24 0604 06/19/24 0448  HGB  --    < > 16.1  --   --  15.8 14.5  HCT  --   --  45.5  --   --  44.6 42.8  PLT  --   --  186  --   --  159 171  APTT 31  --   --   --   --   --   --   LABPROT 17.0*  --   --   --   --   --   --   INR 1.3*  --   --   --   --   --   --   HEPARINUNFRC  --    < >  --    < > 0.48 0.42 0.31  CREATININE  --   --  1.01  --   --  0.84  --    < > = values in this interval not displayed.    Estimated Creatinine Clearance: 99.3 mL/min (by C-G formula based on SCr of 0.84 mg/dL).   Medical History: Past Medical History:  Diagnosis Date   Basal cell carcinoma    L ear, txted in past   Diabetes mellitus without complication (HCC)    GERD (gastroesophageal reflux disease)    Hyperlipidemia    Hypertension    Left shoulder pain    Myocardial infarction (HCC)     Medications:  No chronic AC PTA PTA aspirin  81mg   Assessment: Patient is 73yom admitted for abdominal pain on 8/7 and PMH of HTN, HLD, diabetes, CAD s/p stenting, OSA on CPAP, obesity (BMI 32.7), chronic thrombocytopenia, and liver cirrhosis. S/p cholecystectomy on 8/9, then liver biopsy on 8/13, then ERCP on 8/14. Biopsy from 8/13 showed hepatocellular carcinoma.  Imaging this admission notable for acute non-occlusive left portal vein thrombus. Patient was initiated on heparin  on 8/13 for portal vein thrombosis but anticoagulation was then held while patient underwent biopsy  and ERCP. Pharmacy has now been consulted to resume heparin  with tentative plans to switch to DOAC on 8/16.  8/16:  HL @ 0049 = 0.88, elevated 8/16 1101 HL 0.73,  slightly supratherapeutic 8/16 2048 HL 0.48, therapeutic x 1 8/17 0604 HL 0.42, therapeutic x 2  8/18 0448 HL 0.31, therapeutic x 3   Goal of Therapy:  Heparin  level 0.3-0.7 units/ml Monitor platelets by anticoagulation protocol: Yes   Plan:  8/18:  HL @ 0448 = 0.31, therapeutic X 3 - will continue heparin  drip rate at 1450 units/hr and recheck HL on 8/18 with AM labs.  - CBC daily   Rasheda Ledger D 06/19/2024 5:54 AM

## 2024-06-19 NOTE — Plan of Care (Signed)
  Problem: Education: Goal: Knowledge of General Education information will improve Description: Including pain rating scale, medication(s)/side effects and non-pharmacologic comfort measures Outcome: Progressing   Problem: Clinical Measurements: Goal: Ability to maintain clinical measurements within normal limits will improve Outcome: Progressing   Problem: Clinical Measurements: Goal: Will remain free from infection Outcome: Progressing   Problem: Clinical Measurements: Goal: Diagnostic test results will improve Outcome: Progressing   Problem: Activity: Goal: Risk for activity intolerance will decrease Outcome: Progressing   Problem: Nutrition: Goal: Adequate nutrition will be maintained Outcome: Progressing   Problem: Elimination: Goal: Will not experience complications related to bowel motility Outcome: Progressing   Problem: Coping: Goal: Level of anxiety will decrease Outcome: Progressing

## 2024-06-19 NOTE — Progress Notes (Signed)
 Progress Note   Patient: Nicholas Abbott FMW:[id number] DOB: 1950/12/03 DOA: 06/08/2024     10 DOS: the patient was seen and examined on 06/19/2024   Brief hospital course: 73yo with h/o CAD s/p stenting, HTN, HLD, OSA on CPAP, cirrhosis with chronic thrombocytopenia, and class 1 obesity who presented on 8/7 with abdominal pain.  Found to have acute cholecystitis, now s/p lap chole with drain placement.  Had biliary leak and required stent placement.  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  Oncology is also following.  Also with portal venous thrombosis, needs AC.  Assessment and Plan:  Sepsis due to acute cholecystitis, now with bile leak Sepsis physiology on presentation, has resolved Suspected source cholecystitis, s/p lap chole on 8/9 JP drain left in place and continues to have copious brown drainage HIDA scan with contained bile leak, likely secondary to the remnant cystic duct portion GI was consulted  ERCP on 8/14 with biliary sphincterotomy and stent placement; will need f/u ERCP in 3 months to remove stent Still with significant biliary drain output, ongoing despite stent Planned to stop Ceftriaxone /metronidazole  after 8/14 dose, as he has completed 7 days of therapy; however, still with uptrending leukocytosis so continuing antibiotics for now WBC peaked on 8/16 at 45,000, unclear etiology but likely intra-abdominal pathology given ongoing biliary drainage + leukocytosis; appears to be downtrending Surgery is aware and also closely monitoring Discussed with ID Dr. 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 Melanee has arranged for outpatient f/u on 8/22 (she saw the patient on 8/15 and is planning to present at tumor board, but there is no note available at this time)   Portal venous thrombosis Started on heparin  briefly but this will be held until after ERCP on 8/14 Started heparin  on 8/15; transitioned to Eliquis  on 8/18 Pharmacy says Eliquis  will cost about $115 per month; patient and wife have confirmed with [facility] and want to proceed with Eliquis    Liver cirrhosis with chronic transaminitis/hyperbilirubinemia and chronic thrombocytopenia Unknown etiology Does not appear to have had a significant ETOH use d/o in his lifetime so that is unlikely etiology Appears to be stable, no significant impairment of synthetic function of liver with normal albumin level and INR   CAD Holding aspirin /Plavix  Will transition to DOAC therapy and stop anti-platelet therapies   HTN Hold Toprol  XL (bradycardia), Jardiance  (AKI), hydrochlorothiazide /lisinopril   Resume when needed/indicated   HLD Resume Zetia, rosuvastatin  at time of dc He has had mild transaminitis and will need f/u LFTs as an outpatient   Hypothyroidism Continue Synthroid , Cytomel    Sinus bradycardia Frequent PVCs Compared to his recent office record, baseline heart rate in 40-60s and blood pressure stable BP max 126/72 and HR 72 so will continue to hold metoprolol  for now ([facility] recommends resumption when appropriate)   Disposition Patient is eager to go home, has a job interview this week at [employer] in [place] Needs improvement in biliary drain output, WBC as well as tolerance of Eliquis  prior to dc Discussing with onc/surgery/GI to see if there are additional interventions that  may be helpful           Consultants: Cardiology Surgery Oncology   Procedures: Echocardiogram 8/8 Laparoscopic  cholecystectomy 8/9 US -guided Liver biopsy (IR) 8/13 ERCP 8/14   Antibiotics: Ceftriaxone  8/8- Metronidazole  8/8-  30 Day Unplanned Readmission Risk Score    Flowsheet Row ED to Hosp-Admission (Current) from 06/08/2024 in [facility]  30 Day Unplanned Readmission Risk Score (%) 15.49 Filed at 06/19/2024 0400    This score is the patient's risk of an unplanned readmission within 30 days of being discharged (0 -100%). The score is based on dignosis, age, lab data, medications, orders, and past utilization.   Low:  0-14.9   Medium: 15-21.9   High: 22-29.9   Extreme: 30 and above           Subjective: Feels ok.  Frustrated about drainage but accepting about need to remain hospitalized   Objective: Vitals:   06/19/24 0845 06/19/24 1459  BP: 135/78 117/79  Pulse: 76 80  Resp: 14 18  Temp: 98.3 F (36.8 C) 99 F (37.2 C)  SpO2: 92% 94%    Intake/Output Summary (Last 24 hours) at 06/19/2024 1527 Last data filed at 06/19/2024 1452 Gross per 24 hour  Intake 2270.64 ml  Output 2085 ml  Net 185.64 ml   Filed Weights   06/16/24 0436 06/16/24 1500 06/17/24 0314  Weight: 110.4 kg 110.4 kg 104.1 kg    Exam:  General:  Appears calm and comfortable and is in NAD Eyes:  normal lids, iris ENT:  grossly normal hearing, lips & tongue, mmm Cardiovascular:  RRR. No LE edema.  Respiratory:   CTA bilaterally with no wheezes/rales/rhonchi.  Normal respiratory effort. Abdomen:  soft, NT, ND; surgical wounds healing nicely; R-sided JP drain in place with copious brown drainage Skin:  no rash or induration seen on limited exam; LLQ abdominal ecchymosis noted Musculoskeletal:  grossly normal tone BUE/BLE, good ROM, no bony abnormality Psychiatric:  grossly normal mood and affect, speech fluent and appropriate, AOx3 Neurologic:  CN 2-12 grossly intact, moves all extremities in coordinated fashion  Data Reviewed: I have reviewed the patient's lab results  since admission.  Pertinent labs for today include:   Glucose 135 Albumin 1.8 AST 42/ALT 48/Bili 1.3, improved WBC 40.2, down from 42.9   Family Communication: Son was present   Disposition: Status is: Inpatient Remains inpatient appropriate because: ongoing management     Time spent: 50 minutes  Unresulted Labs (From admission, onward)     Start     Ordered   06/20/24 0500  CBC with Differential/Platelet  Tomorrow morning,   R       Question:  Specimen collection method  Answer:  Lab=Lab collect   06/19/24 1527   06/20/24 0500  Comprehensive metabolic panel with GFR  Tomorrow morning,   R       Question:  Specimen collection method  Answer:  Lab=Lab collect   06/19/24 1527   06/19/24 1524  Culture, fungus without smear  Add-on,   AD        06/19/24 1523   06/19/24 1521  Body fluid cell count with differential  Add-on,   AD       Question:  Are there also cytology or pathology orders on this specimen?  Answer:  Yes   06/19/24 1520   06/19/24 1520  Albumin, pleural or peritoneal fluid   Add-on,   AD       Comments: Add on  06/19/24 1520   06/19/24 1519  Bilirubin, fractionated(tot/dir/indir)  Once,   R       Comments: Add on to IR collection from today    06/19/24 1520   06/19/24 1126  Aerobic/Anaerobic Culture w Gram Stain (surgical/deep wound)  Once,   R       Question:  Patient immune status  Answer:  Normal   06/19/24 1127             Author: Delon Herald, MD 06/19/2024 3:27 PM  For on call review [email or web address].

## 2024-06-19 NOTE — Progress Notes (Signed)
 [facility] SURGICAL PROGRESS NOTE  Hospital Day(s): 10.   Post op day(s): 4 Days Post-Op.   Interval History:  Patient seen and examined No acute events or new complaints overnight.  Did have fever to 100.62F at 2100 (08/17); afebrile since  Patient reports he is doing well  Leukocytosis improving; down to 40.2K (from 42.9K yesterday) Renal function normal; sCr - 1.09 Stable, mild, hyperbilirubinemia to 1.3 LFTs overall stable Drain with 1290 ccs recorded in last 24 hours; this is more amber in color  CT A/P yesterday with residual fluid noted in RUQ, IR consulted with plan for aspiration.drainage today NPO this AM Continues on Unasyn /Fluconazole    Vital signs in last 24 hours: [min-max] current  Temp:  [98.2 F (36.8 C)-100.6 F (38.1 C)] 98.2 F (36.8 C) (08/18 0444) Pulse Rate:  [68-85] 68 (08/18 0444) Resp:  [16-18] 16 (08/18 0444) BP: (120-129)/(73-84) 129/84 (08/18 0444) SpO2:  [92 %-96 %] 96 % (08/18 0444) FiO2 (%):  [21 %] 21 % (08/17 1912)     Height: 6' 2 (188 cm) Weight: 104.1 kg BMI (Calculated): 29.45   Intake/Output last 2 shifts:  08/17 0701 - 08/18 0700 In: 2390.6 [P.O.:480; I.V.:958.1; IV Piggyback:952.6] Out: 1940 [Urine:650; Drains:1290]   Physical Exam:  Constitutional: alert, cooperative and no distress  Respiratory: breathing non-labored at rest  Cardiovascular: regular rate and sinus rhythm  Gastrointestinal: Soft, non-tender, non-distended, no rebound/guarding. Drain in place; Hospital doctor in [employer] Integumentary: Laparoscopic incisions are CDI with dermabond  Labs:     Latest Ref Rng & Units 06/19/2024    4:48 AM 06/18/2024    6:04 AM 06/17/2024    4:37 AM  CBC  WBC 4.0 - 10.5 K/uL 40.2  42.9  45.0   Hemoglobin 13.0 - 17.0 g/dL 85.4  84.1  83.8   Hematocrit 39.0 - 52.0 % 42.8  44.6  45.5   Platelets 150 - 400 K/uL 171  159  186       Latest Ref Rng & Units 06/19/2024    4:48 AM 06/18/2024    6:04 AM 06/17/2024    4:37 AM  CMP   Glucose 70 - 99 mg/dL 864  828  862   BUN 8 - 23 mg/dL 26  23  30    Creatinine 0.61 - 1.24 mg/dL 8.90  9.15  8.98   Sodium 135 - 145 mmol/L 138  135  138   Potassium 3.5 - 5.1 mmol/L 4.1  4.3  4.3   Chloride 98 - 111 mmol/L 104  108  109   CO2 22 - 32 mmol/L 25  22  22    Calcium  8.9 - 10.3 mg/dL 8.4  8.1  8.3   Total Protein 6.5 - 8.1 g/dL 4.7  4.6  5.3   Total Bilirubin 0.0 - 1.2 mg/dL 1.3  1.8  1.3   Alkaline Phos 38 - 126 U/L 59  58  76   AST 15 - 41 U/L 42  41  63   ALT 0 - 44 U/L 48  55  67      Imaging studies: No new pertinent imaging studies   Assessment/Plan:  73 y.o. male s/p robotic assisted laparoscopic cholecystectomy on 08/09 now 4 days s/p ERCP for biliary leak, also with new Dx of HCC   - Appreciate IR evaluation and assistance today; consider cytology to check for malignant ascites - Continue NPO for now given need for IR procedure today; Okay to resume afterwards from surgical perspective - Continue  antimicrobials (Unasyn , Fluconazole ); monitor leukocytosis  - Continue surgical drain; monitor and record output      - Low threshold to check for drain bilirubin, or albumin, but this does not appear overtly bilious this AM  - Monitor abdominal examination   - Pain control prn; antiemetics prn   - Mobilize as feasible    - Further management per primary service; we will follow   All of the above findings and recommendations were discussed with the patient, and the medical team, and all of patient's questions were answered to his expressed satisfaction.  -- Arthea Platt, PA-C [facility] 06/19/2024, 7:14 AM M-F: 7am - 4pm

## 2024-06-20 DIAGNOSIS — K8000 Calculus of gallbladder with acute cholecystitis without obstruction: Secondary | ICD-10-CM | POA: Diagnosis not present

## 2024-06-20 LAB — GLUCOSE, CAPILLARY
Glucose-Capillary: 152 mg/dL — ABNORMAL HIGH (ref 70–99)
Glucose-Capillary: 186 mg/dL — ABNORMAL HIGH (ref 70–99)
Glucose-Capillary: 145 mg/dL — ABNORMAL HIGH (ref 70–99)
Glucose-Capillary: 128 mg/dL — ABNORMAL HIGH (ref 70–99)

## 2024-06-20 LAB — CBC WITH DIFFERENTIAL/PLATELET
Abs Immature Granulocytes: 0.69 K/uL — ABNORMAL HIGH (ref 0.00–0.07)
Basophils Absolute: 0.1 K/uL (ref 0.0–0.1)
Basophils Relative: 0 %
Eosinophils Absolute: 0.5 K/uL (ref 0.0–0.5)
Eosinophils Relative: 1 %
HCT: 45 % (ref 39.0–52.0)
Hemoglobin: 14.5 g/dL (ref 13.0–17.0)
Immature Granulocytes: 2 %
Lymphocytes Relative: 56 %
Lymphs Abs: 18.7 K/uL — ABNORMAL HIGH (ref 0.7–4.0)
MCH: 34 pg (ref 26.0–34.0)
MCHC: 32.2 g/dL (ref 30.0–36.0)
MCV: 105.6 fL — ABNORMAL HIGH (ref 80.0–100.0)
Monocytes Absolute: 2 K/uL — ABNORMAL HIGH (ref 0.1–1.0)
Monocytes Relative: 6 %
Neutro Abs: 11.5 K/uL — ABNORMAL HIGH (ref 1.7–7.7)
Neutrophils Relative %: 35 %
Platelets: 147 K/uL — ABNORMAL LOW (ref 150–400)
RBC: 4.26 MIL/uL (ref 4.22–5.81)
RDW: 15.9 % — ABNORMAL HIGH (ref 11.5–15.5)
Smear Review: NORMAL
WBC: 33.4 K/uL — ABNORMAL HIGH (ref 4.0–10.5)
nRBC: 0 % (ref 0.0–0.2)

## 2024-06-20 LAB — COMPREHENSIVE METABOLIC PANEL WITH GFR
ALT: 50 U/L — ABNORMAL HIGH (ref 0–44)
AST: 47 U/L — ABNORMAL HIGH (ref 15–41)
Albumin: 1.8 g/dL — ABNORMAL LOW (ref 3.5–5.0)
Alkaline Phosphatase: 66 U/L (ref 38–126)
Anion gap: 9 (ref 5–15)
BUN: 26 mg/dL — ABNORMAL HIGH (ref 8–23)
CO2: 21 mmol/L — ABNORMAL LOW (ref 22–32)
Calcium: 8.4 mg/dL — ABNORMAL LOW (ref 8.9–10.3)
Chloride: 106 mmol/L (ref 98–111)
Creatinine, Ser: 0.8 mg/dL (ref 0.61–1.24)
GFR, Estimated: >60 mL/min (ref >60)
Glucose, Bld: 145 mg/dL — ABNORMAL HIGH (ref 70–99)
Potassium: 4.6 mmol/L (ref 3.5–5.1)
Sodium: 136 mmol/L (ref 135–145)
Total Bilirubin: 1.1 mg/dL (ref 0.0–1.2)
Total Protein: 4.7 g/dL — ABNORMAL LOW (ref 6.5–8.1)

## 2024-06-20 MED ORDER — EMPAGLIFLOZIN 10 MG PO TABS
10.0000 mg | ORAL_TABLET | Freq: Every day | ORAL | Status: DC
  Administered 2024-06-21: 10 mg via ORAL
  Filled 2024-06-20: qty 1

## 2024-06-20 NOTE — Plan of Care (Signed)

## 2024-06-20 NOTE — Progress Notes (Signed)
 [facility] SURGICAL PROGRESS NOTE  Hospital Day(s): 11.   Post op day(s): 5 Days Post-Op.   Interval History:  Patient seen and examined No acute events or new complaints overnight.  No fever last 24 hours  Patient reports he is doing okay; pain comes and \\goes  mainly in morning/evening No nausea, emesis  Leukocytosis improving; down to 33.4K Renal function normal; sCr - 0.80 Bilirubin normalized; 1.1 LFTs overall stable Drain with 1075 ccs recorded in last 24 hours; this is grossly serous  IR Aspiration; Cx and cytology pending Continues on Unasyn /Fluconazole   Regular diet   Vital signs in last 24 hours: [min-max] current  Temp:  [97.9 F (36.6 C)-99.6 F (37.6 C)] 97.9 F (36.6 C) (08/19 0308) Pulse Rate:  [64-80] 64 (08/19 0308) Resp:  [14-19] 19 (08/19 0308) BP: (114-141)/(78-84) 141/84 (08/19 0308) SpO2:  [92 %-97 %] 97 % (08/19 0308) FiO2 (%):  [21 %] 21 % (08/18 1933)     Height: 6' 2 (188 cm) Weight: 104.1 kg BMI (Calculated): 29.45   Intake/Output last 2 shifts:  08/18 0701 - 08/19 0700 In: 946.4 [P.O.:240; IV Piggyback:706.4] Out: 1475 [Urine:400; Drains:1075]   Physical Exam:  Constitutional: alert, cooperative and no distress  Respiratory: breathing non-labored at rest  Cardiovascular: regular rate and sinus rhythm  Gastrointestinal: Soft, non-tender, non-distended, no rebound/guarding. Drain in place; grossly serous Integumentary: Laparoscopic incisions are CDI with dermabond  Labs:     Latest Ref Rng & Units 06/20/2024    3:28 AM 06/19/2024    4:48 AM 06/18/2024    6:04 AM  CBC  WBC 4.0 - 10.5 K/uL 33.4  40.2  42.9   Hemoglobin 13.0 - 17.0 g/dL 85.4  85.4  84.1   Hematocrit 39.0 - 52.0 % 45.0  42.8  44.6   Platelets 150 - 400 K/uL 147  171  159       Latest Ref Rng & Units 06/20/2024    3:28 AM 06/19/2024    4:48 AM 06/18/2024    6:04 AM  CMP  Glucose 70 - 99 mg/dL 854  864  828   BUN 8 - 23 mg/dL 26  26  23    Creatinine 0.61  - 1.24 mg/dL 9.19  8.90  9.15   Sodium 135 - 145 mmol/L 136  138  135   Potassium 3.5 - 5.1 mmol/L 4.6  4.1  4.3   Chloride 98 - 111 mmol/L 106  104  108   CO2 22 - 32 mmol/L 21  25  22    Calcium  8.9 - 10.3 mg/dL 8.4  8.4  8.1   Total Protein 6.5 - 8.1 g/dL 4.7  4.7  4.6   Total Bilirubin 0.0 - 1.2 mg/dL 1.1  1.3    1.3  1.8   Alkaline Phos 38 - 126 U/L 66  59  58   AST 15 - 41 U/L 47  42  41   ALT 0 - 44 U/L 50  48  55      Imaging studies: No new pertinent imaging studies   Assessment/Plan:  73 y.o. male s/p robotic assisted laparoscopic cholecystectomy on 08/09 now 4 days s/p ERCP for biliary leak, also with new Dx of HCC   - Appreciate IR evaluation and assistance; follow up cytology, Cx - Okay to continue diet as tolerated  - Continue antimicrobials (Unasyn , Fluconazole ); monitor leukocytosis  - Continue surgical drain; monitor and record output; slowly trending down, grossly serous   - Monitor abdominal  examination   - Pain control prn; antiemetics prn   - Mobilize as feasible    - Further management per primary service; we will follow    - Discharge Planning: From a surgical perspective, he is doing reasonably well. Leukocytosis is making improvements. Drain is serous but still high output. I do think he may benefit from another 24-48 hours to ensure WBC continues to make improvements, drain output slows, and nothing grows from Cx on 08/18  All of the above findings and recommendations were discussed with the patient, and the medical team, and all of patient's questions were answered to his expressed satisfaction.  -- Temesgen Weightman, PA-C [facility] 06/20/2024, 7:45 AM M-F: 7am - 4pm

## 2024-06-20 NOTE — Discharge Instructions (Addendum)
 Information on my medicine - ELIQUIS  (apixaban )  This medication education was reviewed with me or my healthcare representative as part of my discharge preparation.  The pharmacist that spoke with me during my hospital stay was:    Why was Eliquis  prescribed for you? Eliquis  was prescribed to treat blood clots that may have been found in the veins of your legs (deep vein thrombosis) or in your lungs (pulmonary embolism) and to reduce the risk of them occurring again.  What do You need to know about Eliquis  ? The starting dose is 10 mg (two 5 mg tablets) taken TWICE daily for the FIRST SEVEN (7) DAYS, then on (enter date)  06/26/2024  the dose is reduced to ONE 5 mg tablet taken TWICE daily.  Eliquis  may be taken with or without food.   Try to take the dose about the same time in the morning and in the evening. If you have difficulty swallowing the tablet whole please discuss with your pharmacist how to take the medication safely.  Take Eliquis  exactly as prescribed and DO NOT stop taking Eliquis  without talking to the doctor who prescribed the medication.  Stopping may increase your risk of developing a new blood clot.  Refill your prescription before you run out.  After discharge, you should have regular check-up appointments with your healthcare provider that is prescribing your Eliquis .    What do you do if you miss a dose? If a dose of ELIQUIS  is not taken at the scheduled time, take it as soon as possible on the same day and twice-daily administration should be resumed. The dose should not be doubled to make up for a missed dose.  Important Safety Information A possible side effect of Eliquis  is bleeding. You should call your healthcare provider right away if you experience any of the following: Bleeding from an injury or your nose that does not stop. Unusual colored urine (red or dark brown) or unusual colored stools (red or black). Unusual bruising for unknown reasons. A  serious fall or if you hit your head (even if there is no bleeding).  Some medicines may interact with Eliquis  and might increase your risk of bleeding or clotting while on Eliquis . To help avoid this, consult your healthcare provider or pharmacist prior to using any new prescription or non-prescription medications, including herbals, vitamins, non-steroidal anti-inflammatory drugs (NSAIDs) and supplements.  This website has more information on Eliquis  (apixaban ): http://[email or web address] .com/eliquis dena

## 2024-06-20 NOTE — Progress Notes (Signed)
 Progress Note   Patient: Nicholas Abbott FMW:[id number] DOB: 10/12/1951 DOA: 06/08/2024     11 DOS: the patient was seen and examined on 06/20/2024   Brief hospital course: 73yo with h/o CAD s/p stenting, HTN, HLD, OSA on CPAP, cirrhosis with chronic thrombocytopenia, and class 1 obesity who presented on 8/7 with abdominal pain.  Found to have acute cholecystitis, now s/p lap chole with drain placement.  Had biliary leak and required stent placement.  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  Oncology is also following.  Also with portal venous thrombosis, needs AC.  Assessment and Plan:  Sepsis due to acute cholecystitis, now with bile leak Sepsis physiology on presentation, has resolved Suspected source cholecystitis, s/p lap chole on 8/9 JP drain left in place and continues to have copious brown drainage HIDA scan with contained bile leak, likely secondary to the remnant cystic duct portion GI was consulted  ERCP on 8/14 with biliary sphincterotomy and stent placement; will need f/u ERCP in 3 months to remove stent Still with significant biliary drain output, ongoing despite stent Planned to stop Ceftriaxone /metronidazole  after 8/14 dose, as he has completed 7 days of therapy; however, still with uptrending leukocytosis so continuing antibiotics for now WBC peaked on 8/16 at 45,000, unclear etiology but likely intra-abdominal pathology given ongoing biliary drainage + leukocytosis; appears to be downtrending Surgery is aware and also closely monitoring Discussed with ID Dr. Luiz - will change antibiotics to Unasyn  to cover Enterococcus + Fluconazole ; WBC count slow improving IR drained some fluid 8/18 and studies are pending His drain output finally appears to be improving, as well as his WBC count   Multiple liver and pancreatic lesions Noted on dedicated liver MRI Liver lesions concerning for [facility] in the background of cirrhosis; pathology from 8/13 confirmed  moderate differentiated hepatocellular carcinoma Pancreatic lesions suspicious for IPMN AFP and CA 19-9 elevated Oncology also consulted, has been notified of result and Dr. Melanee has arranged for outpatient f/u on 8/22 (she saw the patient on 8/15 and is planning to present at tumor board, but there is no note available at this time)   Portal venous thrombosis Started on heparin  briefly but this will be held until after ERCP on 8/14 Started heparin  on 8/15; transitioned to Eliquis  on 8/18 Pharmacy says Eliquis  will cost about $115 per month; patient and wife [facility] with [facility] and want to proceed with Eliquis    Liver cirrhosis with chronic transaminitis/hyperbilirubinemia and chronic thrombocytopenia Unknown etiology Does not appear to have had a significant ETOH use d/o in his lifetime so that is unlikely etiology Appears to be stable, no significant impairment of synthetic function of liver with normal albumin level and INR   CAD Holding aspirin /Plavix  Will transition to DOAC therapy and stop anti-platelet therapies   HTN Hold Toprol  XL (bradycardia), Jardiance  (AKI), hydrochlorothiazide /lisinopril   Resume when needed/indicated Will restart Jardiance  on 8/20   HLD Resume Zetia, rosuvastatin  at time of dc He has had mild transaminitis and will need f/u LFTs as an outpatient   Hypothyroidism Continue Synthroid , Cytomel    Sinus bradycardia Frequent PVCs Compared to his recent office record, baseline heart rate in 40-60s and blood pressure stable BP max 126/72 and HR 72 so will continue to hold metoprolol  for now ([facility] recommends resumption when appropriate)   Disposition Patient is eager to go home, has a job interview this week at [employer] in [place] Needs improvement in biliary drain output, WBC as  well as tolerance of Eliquis  prior to dc Discussing with onc/surgery/GI  If ongoing improvement in WBC count and drain output, possible dc as early as  8/20           Consultants: Cardiology Surgery Oncology IR ID (telephone only)   Procedures: Echocardiogram 8/8 Laparoscopic cholecystectomy 8/9 US -guided Liver biopsy (IR) 8/13 ERCP 8/14 US -guided perihepatic fluid aspiration 8/18   Antibiotics: Ceftriaxone  8/8-16 Metronidazole  8/8-16 Unasyn  8/16- Fluconazole  8/16-   30 Day Unplanned Readmission Risk Score    Flowsheet Row ED to Hosp-Admission (Current) from 06/08/2024 in [facility]  30 Day Unplanned Readmission Risk Score (%) 16.21 Filed at 06/20/2024 0400    This score is the patient's risk of an unplanned readmission within 30 days of being discharged (0 -100%). The score is based on dignosis, age, lab data, medications, orders, and past utilization.   Low:  0-14.9   Medium: 15-21.9   High: 22-29.9   Extreme: 30 and above           Subjective: Feeling well, drain output is slowing down and clearing up.   Objective: Vitals:   06/20/24 0308 06/20/24 0754  BP: (!) 141/84 122/83  Pulse: 64 70  Resp: 19 14  Temp: 97.9 F (36.6 C) 98.1 F (36.7 C)  SpO2: 97% 94%    Intake/Output Summary (Last 24 hours) at 06/20/2024 1445 Last data filed at 06/20/2024 1402 Gross per 24 hour  Intake 946.36 ml  Output 950 ml  Net -3.64 ml   Filed Weights   06/16/24 0436 06/16/24 1500 06/17/24 0314  Weight: 110.4 kg 110.4 kg 104.1 kg    Exam:  General:  Appears calm and comfortable and is in NAD Eyes:  normal lids, iris ENT:  grossly normal hearing, lips & tongue, mmm Cardiovascular:  RRR. No LE edema.  Respiratory:   CTA bilaterally with no wheezes/rales/rhonchi.  Normal respiratory effort. Abdomen:  soft, NT, ND; surgical wounds healing nicely; R-sided JP drain in place with copious brown drainage Skin:  no rash or induration seen on limited exam; LLQ abdominal ecchymosis noted Musculoskeletal:  grossly normal tone BUE/BLE, good ROM, no bony abnormality Psychiatric:  grossly  normal mood and affect, speech fluent and appropriate, AOx3 Neurologic:  CN 2-12 grossly intact, moves all extremities in coordinated fashion  Data Reviewed: I have reviewed the patient's lab results since admission.  Pertinent labs for today include:   Glucose 145 Albumin 1.8 AST 47/ALT 50/Bili 1.1 Fluid culture NTD Fluid fungal culture negative    Family Communication: Wife and 2 additional family members were present  Disposition: Status is: Inpatient Remains inpatient appropriate because: ongoing management     Time spent: 50 minutes  Unresulted Labs (From admission, onward)     Start     Ordered   06/21/24 0500  CBC with Differential/Platelet  Tomorrow morning,   R       Question:  Specimen collection method  Answer:  Lab=Lab collect   06/20/24 1445   06/21/24 0500  Comprehensive metabolic panel with GFR  Tomorrow morning,   R       Question:  Specimen collection method  Answer:  Lab=Lab collect   06/20/24 1445             Author: Delon Herald, MD 06/20/2024 2:45 PM  For on call review [email or web address].

## 2024-06-21 ENCOUNTER — Other Ambulatory Visit: Payer: Self-pay

## 2024-06-21 DIAGNOSIS — K8000 Calculus of gallbladder with acute cholecystitis without obstruction: Secondary | ICD-10-CM | POA: Diagnosis not present

## 2024-06-21 DIAGNOSIS — K838 Other specified diseases of biliary tract: Secondary | ICD-10-CM

## 2024-06-21 DIAGNOSIS — K7460 Unspecified cirrhosis of liver: Secondary | ICD-10-CM | POA: Diagnosis not present

## 2024-06-21 DIAGNOSIS — R160 Hepatomegaly, not elsewhere classified: Secondary | ICD-10-CM | POA: Diagnosis not present

## 2024-06-21 LAB — COMPREHENSIVE METABOLIC PANEL WITH GFR
ALT: 59 U/L — ABNORMAL HIGH (ref 0–44)
AST: 62 U/L — ABNORMAL HIGH (ref 15–41)
Albumin: 1.9 g/dL — ABNORMAL LOW (ref 3.5–5.0)
Alkaline Phosphatase: 89 U/L (ref 38–126)
Anion gap: 6 (ref 5–15)
BUN: 24 mg/dL — ABNORMAL HIGH (ref 8–23)
CO2: 25 mmol/L (ref 22–32)
Calcium: 8.5 mg/dL — ABNORMAL LOW (ref 8.9–10.3)
Chloride: 105 mmol/L (ref 98–111)
Creatinine, Ser: 1.21 mg/dL (ref 0.61–1.24)
GFR, Estimated: >60 mL/min (ref >60)
Glucose, Bld: 120 mg/dL — ABNORMAL HIGH (ref 70–99)
Potassium: 5 mmol/L (ref 3.5–5.1)
Sodium: 136 mmol/L (ref 135–145)
Total Bilirubin: 1.4 mg/dL — ABNORMAL HIGH (ref 0.0–1.2)
Total Protein: 4.9 g/dL — ABNORMAL LOW (ref 6.5–8.1)

## 2024-06-21 LAB — CBC WITH DIFFERENTIAL/PLATELET
Abs Immature Granulocytes: 0.5 K/uL — ABNORMAL HIGH (ref 0.00–0.07)
Basophils Absolute: 0.2 K/uL — ABNORMAL HIGH (ref 0.0–0.1)
Basophils Relative: 1 %
Eosinophils Absolute: 0.5 K/uL (ref 0.0–0.5)
Eosinophils Relative: 2 %
HCT: 42.1 % (ref 39.0–52.0)
Hemoglobin: 14.5 g/dL (ref 13.0–17.0)
Immature Granulocytes: 2 %
Lymphocytes Relative: 61 %
Lymphs Abs: 18.5 K/uL — ABNORMAL HIGH (ref 0.7–4.0)
MCH: 34.2 pg — ABNORMAL HIGH (ref 26.0–34.0)
MCHC: 34.4 g/dL (ref 30.0–36.0)
MCV: 99.3 fL (ref 80.0–100.0)
Monocytes Absolute: 1.7 K/uL — ABNORMAL HIGH (ref 0.1–1.0)
Monocytes Relative: 6 %
Neutro Abs: 8.2 K/uL — ABNORMAL HIGH (ref 1.7–7.7)
Neutrophils Relative %: 28 %
Platelets: 196 K/uL (ref 150–400)
RBC: 4.24 MIL/uL (ref 4.22–5.81)
RDW: 15.9 % — ABNORMAL HIGH (ref 11.5–15.5)
Smear Review: NORMAL
WBC: 29.5 K/uL — ABNORMAL HIGH (ref 4.0–10.5)
nRBC: 0 % (ref 0.0–0.2)

## 2024-06-21 LAB — GLUCOSE, CAPILLARY
Glucose-Capillary: 126 mg/dL — ABNORMAL HIGH (ref 70–99)
Glucose-Capillary: 175 mg/dL — ABNORMAL HIGH (ref 70–99)

## 2024-06-21 LAB — CYTOLOGY - NON PAP

## 2024-06-21 MED ORDER — AMOXICILLIN-POT CLAVULANATE 875-125 MG PO TABS
1.0000 | ORAL_TABLET | Freq: Two times a day (BID) | ORAL | 0 refills | Status: AC
  Filled 2024-06-21: qty 6, 3d supply, fill #0

## 2024-06-21 MED ORDER — FLUCONAZOLE 200 MG PO TABS
400.0000 mg | ORAL_TABLET | Freq: Every day | ORAL | 0 refills | Status: AC
  Filled 2024-06-21: qty 6, 3d supply, fill #0

## 2024-06-21 MED ORDER — APIXABAN 5 MG PO TABS
ORAL_TABLET | ORAL | 0 refills | Status: AC
  Filled 2024-06-21: qty 70, 30d supply, fill #0

## 2024-06-21 NOTE — Progress Notes (Signed)
 [facility] SURGICAL PROGRESS NOTE  Hospital Day(s): 12.   Post op day(s): 6 Days Post-Op.   Interval History:  Patient seen and examined No acute events or new complaints overnight.  Patient reports he is feeling better Abdominal soreness; worse in AM and at night but well controlled  No nausea, emesis  Leukocytosis improving; down to 29K Renal function normal; sCr - 1.21 Bilirubin fluctuating: 1.4 LFTs overall stable Drain with 895 ccs recorded in last 24 hours; this is grossly serous  IR Aspiration; Cx and cytology pending Continues on Unasyn /Fluconazole   Regular diet   Vital signs in last 24 hours: [min-max] current  Temp:  [98.1 F (36.7 C)-99.2 F (37.3 C)] 98.5 F (36.9 C) (08/20 0255) Pulse Rate:  [66-70] 66 (08/20 0255) Resp:  [14-20] 20 (08/20 0255) BP: (122-141)/(72-85) 141/85 (08/20 0255) SpO2:  [94 %-100 %] 97 % (08/20 0255) FiO2 (%):  [21 %] 21 % (08/19 1906) Weight:  [108.4 kg] 108.4 kg (08/20 0146)     Height: 6' 2 (188 cm) Weight: 108.4 kg BMI (Calculated): 30.67   Intake/Output last 2 shifts:  08/19 0701 - 08/20 0700 In: 961.8 [P.O.:240; IV Piggyback:721.8] Out: 895 [Drains:895]   Physical Exam:  Constitutional: alert, cooperative and no distress  Respiratory: breathing non-labored at rest  Cardiovascular: regular rate and sinus rhythm  Gastrointestinal: Soft, non-tender, non-distended, no rebound/guarding. Drain in place; grossly serous Integumentary: Laparoscopic incisions are CDI with dermabond, significant ecchymosis to the LLQ  Labs:     Latest Ref Rng & Units 06/21/2024    3:32 AM 06/20/2024    3:28 AM 06/19/2024    4:48 AM  CBC  WBC 4.0 - 10.5 K/uL 29.5  33.4  40.2   Hemoglobin 13.0 - 17.0 g/dL 85.4  85.4  85.4   Hematocrit 39.0 - 52.0 % 42.1  45.0  42.8   Platelets 150 - 400 K/uL 196  147  171       Latest Ref Rng & Units 06/21/2024    3:32 AM 06/20/2024    3:28 AM 06/19/2024    4:48 AM  CMP  Glucose 70 - 99 mg/dL  879  854  864   BUN 8 - 23 mg/dL 24  26  26    Creatinine 0.61 - 1.24 mg/dL 8.78  9.19  8.90   Sodium 135 - 145 mmol/L 136  136  138   Potassium 3.5 - 5.1 mmol/L 5.0  4.6  4.1   Chloride 98 - 111 mmol/L 105  106  104   CO2 22 - 32 mmol/L 25  21  25    Calcium  8.9 - 10.3 mg/dL 8.5  8.4  8.4   Total Protein 6.5 - 8.1 g/dL 4.9  4.7  4.7   Total Bilirubin 0.0 - 1.2 mg/dL 1.4  1.1  1.3    1.3   Alkaline Phos 38 - 126 U/L 89  66  59   AST 15 - 41 U/L 62  47  42   ALT 0 - 44 U/L 59  50  48      Imaging studies: No new pertinent imaging studies   Assessment/Plan:  73 y.o. male s/p robotic assisted laparoscopic cholecystectomy on 08/09 now 4 days s/p ERCP for biliary leak, also with new Dx of HCC   - Appreciate IR evaluation and assistance; follow up cytology, Cx - Okay to continue diet as tolerated  - Continue antimicrobials (Unasyn , Fluconazole ); monitor leukocytosis; improving - Continue surgical drain; monitor and record  output; slowly trending down, grossly serous   - Monitor abdominal examination   - Pain control prn; antiemetics prn   - Monitor leukocytosis; improving   - Mobilize as feasible    - Further management per primary service; we will follow    - Discharge Planning: From a surgical perspective, he can be discharged home. Will need to go with drain. We will follow up next week for anticipated removal as long as this remains serous in nature. I will update instructions as well. Will likely benefit from PO Abx for home to complete 7-10 days  All of the above findings and recommendations were discussed with the patient, and the medical team, and all of patient's questions were answered to his expressed satisfaction.  -- Arthea Platt, PA-C [facility] 06/21/2024, 7:25 AM M-F: 7am - 4pm

## 2024-06-21 NOTE — Progress Notes (Signed)
 Miquel Sack Hoene to be D/C'd Home per MD order.  Discussed with the patient and all questions fully answered.  VSS, Skin clean, dry and intact without evidence of skin break down, no evidence of skin tears noted. IV catheters discontinued intact. Site without signs and symptoms of complications. Dressing and pressure applied.  An After Visit Summary was printed and given to the patient. Patient prescriptions sent to pharmacy.  D/c education completed with patient/family including follow up instructions, medication list, d/c activities limitations if indicated, with other d/c instructions as indicated by MD, and drain care- patient able to verbalize understanding, all questions fully answered.   Patient instructed to return to ED, call 911, or call MD for any changes in condition.   Patient escorted via WC, and D/C home via private auto.  Ileana LITTIE Gainer 06/21/2024 1:36 PM

## 2024-06-21 NOTE — Plan of Care (Signed)
 Having to change dressing to drain site multiple times a day from leaking at site.  Problem: Education: Goal: Knowledge of General Education information will improve Description: Including pain rating scale, medication(s)/side effects and non-pharmacologic comfort measures Outcome: Progressing   Problem: Health Behavior/Discharge Planning: Goal: Ability to manage health-related needs will improve Outcome: Progressing   Problem: Clinical Measurements: Goal: Ability to maintain clinical measurements within normal limits will improve Outcome: Progressing Goal: Will remain free from infection Outcome: Progressing Goal: Diagnostic test results will improve Outcome: Progressing Goal: Respiratory complications will improve Outcome: Progressing Goal: Cardiovascular complication will be avoided Outcome: Progressing   Problem: Activity: Goal: Risk for activity intolerance will decrease Outcome: Progressing   Problem: Nutrition: Goal: Adequate nutrition will be maintained Outcome: Progressing   Problem: Coping: Goal: Level of anxiety will decrease Outcome: Progressing   Problem: Elimination: Goal: Will not experience complications related to bowel motility Outcome: Progressing Goal: Will not experience complications related to urinary retention Outcome: Progressing   Problem: Pain Managment: Goal: General experience of comfort will improve and/or be controlled Outcome: Progressing   Problem: Safety: Goal: Ability to remain free from injury will improve Outcome: Progressing   Problem: Skin Integrity: Goal: Risk for impaired skin integrity will decrease Outcome: Progressing   Problem: Education: Goal: Ability to describe self-care measures that may prevent or decrease complications (Diabetes Survival Skills Education) will improve Outcome: Progressing Goal: Individualized Educational Video(s) Outcome: Progressing   Problem: Coping: Goal: Ability to adjust to condition or  change in health will improve Outcome: Progressing   Problem: Fluid Volume: Goal: Ability to maintain a balanced intake and output will improve Outcome: Progressing   Problem: Health Behavior/Discharge Planning: Goal: Ability to identify and utilize available resources and services will improve Outcome: Progressing Goal: Ability to manage health-related needs will improve Outcome: Progressing   Problem: Metabolic: Goal: Ability to maintain appropriate glucose levels will improve Outcome: Progressing   Problem: Nutritional: Goal: Maintenance of adequate nutrition will improve Outcome: Progressing Goal: Progress toward achieving an optimal weight will improve Outcome: Progressing   Problem: Skin Integrity: Goal: Risk for impaired skin integrity will decrease Outcome: Progressing   Problem: Tissue Perfusion: Goal: Adequacy of tissue perfusion will improve Outcome: Progressing

## 2024-06-21 NOTE — Discharge Summary (Signed)
 Physician Discharge Summary   Patient: Nicholas Abbott MRN: [id number] DOB: 1951-06-24  Admit date:     06/08/2024  Discharge date: 06/21/24  Discharge Physician: Murvin Mana   PCP: Sherial Bail, MD   Recommendations at discharge:   PCP in 1 week. Follow-up with general surgery as scheduled. Follow-up with oncology in 1 week.  Discharge Diagnoses: Principal Problem:   Calculus of gallbladder with acute cholecystitis without obstruction Active Problems:   Obesity   Type 2 diabetes mellitus without complication, without long-term current use of insulin  (HCC)   Hypertension   Cirrhosis of liver with ascites (HCC)   Liver masses   Bile leak   Portal vein thrombosis   Bile duct leak  Resolved Problems:   * No resolved hospital problems. [facility] Course: [id number] with h/o CAD s/p stenting, HTN, HLD, OSA on CPAP, cirrhosis with chronic thrombocytopenia, and class 1 obesity who presented on 8/7 with abdominal pain.  Found to have acute cholecystitis, now s/p lap chole with drain placement.  Had biliary leak and required stent placement.  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  Oncology is also following.  Also with portal venous thrombosis, needs AC.  Assessment and Plan: Sepsis due to acute cholecystitis, now with bile leak Sepsis physiology on presentation, has resolved Suspected source cholecystitis, s/p lap chole on 8/9 JP drain left in place and continues to have copious brown drainage HIDA scan with contained bile leak, likely secondary to the remnant cystic duct portion GI was consulted  ERCP on 8/14 with biliary sphincterotomy and stent placement; will need f/u ERCP in 3 months to remove stent Still with significant biliary drain output, ongoing despite stent Planned to stop Ceftriaxone /metronidazole  after 8/14 dose, as he has completed 7 days of therapy; however, still with uptrending leukocytosis so continuing antibiotics for now WBC  peaked on 8/16 at 45,000, unclear etiology but likely intra-abdominal pathology given ongoing biliary drainage + leukocytosis; appears to be downtrending Surgery is aware and also closely monitoring Discussed with ID Dr. Luiz - will change antibiotics to Unasyn  to cover Enterococcus + Fluconazole ; WBC count slow improving IR drained some fluid 8/18, culture negative for bacteria and fungus. The volume of the JP drain has dramatically decreased.  Leukocytosis is gradually getting better, however, majority of cells are lymphocytes instead of neutrophils.  Will continue additional 3 days of Augmentin  and Diflucan  to complete a 7-day course since the restart of antibiotics. Surgery has cleared patient for discharge.   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 Melanee has arranged for outpatient f/u on 8/22 (she saw the patient on 8/15 and is planning to present at tumor board, but there is no note available at this time) Be followed with Dr. Melanee in 1 week.   Portal venous thrombosis Started on heparin  briefly but this will be held until after ERCP on 8/14 Started heparin  on 8/15; transitioned to Eliquis  on 8/18 Pharmacy says Eliquis  will cost about $115 per month; patient and wife have confirmed with [facility] and want to proceed with Eliquis .   Liver cirrhosis with chronic transaminitis/hyperbilirubinemia and chronic thrombocytopenia Unknown etiology Does not appear to have had a significant ETOH use d/o in his lifetime so that is unlikely  etiology Appears to be stable, no significant impairment of synthetic function of liver with normal albumin level and INR   CAD Patient was restarted on anticoagulation, Plavix   discontinued,    HLD Resume Zetia, rosuvastatin  at time of dc He has had mild transaminitis and will need f/u LFTs as an outpatient   Hypothyroidism Continue Synthroid , Cytomel    Sinus bradycardia Frequent PVCs Essential hypertension Has sinus bradycardia, blood pressure not significant elevated.  Would hold all blood pressure medicines at discharge.  Follow-up with PCP to restart if needed.          Consultants: General Surgery, Oncology Procedures performed: Cholecystectomy.  ERCP. Disposition: Home Diet recommendation:  Discharge Diet Orders (From admission, onward)     Start     Ordered   06/21/24 0000  Diet - low sodium heart healthy        06/21/24 1309           Cardiac diet DISCHARGE MEDICATION: Allergies as of 06/21/2024       Reactions   Pioglitazone Rash        Medication List     STOP taking these medications    clopidogrel  75 MG tablet Commonly known as: PLAVIX    lisinopril -hydrochlorothiazide  20-12.5 MG tablet Commonly known as: ZESTORETIC    metoprolol  succinate 25 MG 24 hr tablet Commonly known as: TOPROL -XL       TAKE these medications    acetaminophen  500 MG tablet Commonly known as: TYLENOL  Take 1,000 mg by mouth every 8 (eight) hours as needed.   amoxicillin -clavulanate 875-125 MG tablet Commonly known as: AUGMENTIN  Take 1 tablet by mouth 2 (two) times daily for 3 days.   apixaban  5 MG Tabs tablet Commonly known as: ELIQUIS  Take 2 tablets (10 mg total) by mouth 2 (two) times daily for 5 days, THEN 1 tablet (5 mg total) 2 (two) times daily. Start taking on: June 21, 2024   aspirin  EC 81 MG tablet Take 81 mg by mouth daily.   Blood Glucose Monitoring Suppl Devi 1 each by Does not apply route in the morning, at noon, and at bedtime. May substitute to any manufacturer covered by patient's insurance.   Cinnamon 500 MG capsule Take 1,000 mg by mouth daily.   ezetimibe 10 MG tablet Commonly known as: ZETIA Take 10  mg by mouth daily.   fluconazole  200 MG tablet Commonly known as: Diflucan  Take 2 tablets (400 mg total) by mouth daily for 3 days.   folic acid -pyridoxine -cyancobalamin 2.5-25-2 MG Tabs tablet Commonly known as: FOLTX Take 1 tablet by mouth daily.   hydrocortisone  2.5 % cream APPLY TO THE GROIN DAILY ON MONDAY, WEDNESDAY, AND FRIDAY.   Jardiance  10 MG Tabs tablet Generic drug: empagliflozin  Take 1 tablet by mouth daily with breakfast.   ketoconazole  2 % cream Commonly known as: NIZORAL  APPLY TO THE GROIN AT BEDTIME   levothyroxine  112 MCG tablet Commonly known as: SYNTHROID  Take 112 mcg by mouth daily.   liothyronine  5 MCG tablet Commonly known as: CYTOMEL  Take 5 mcg by mouth every morning.   nitroGLYCERIN  0.4 MG SL tablet Commonly known as: NITROSTAT  Place 0.4 mg under the tongue every 5 (five) minutes as needed for chest pain.   PHOSPHATIDYLSERINE PO Take by mouth.   rosuvastatin  40 MG tablet Commonly known as: CRESTOR  Take by mouth.   tadalafil 5 MG tablet Commonly known as: CIALIS Take 5 mg by mouth daily as needed for erectile dysfunction.   traMADol  50 MG tablet Commonly known as: ULTRAM   Take 50 mg by mouth daily as needed for severe pain or moderate pain.   Vitamin B-12 5000 MCG Tbdp Take 5,000 mcg by mouth daily.               Discharge Care Instructions  (From admission, onward)           Start     Ordered   06/21/24 0000  Discharge wound care:       Comments: Follow with pcp   06/21/24 1309            Follow-up Information     Joshua Elsie RAMAN, MD. Go in 1 week(s).   Specialty: Internal Medicine Contact information: [address] [place] [address] [phone]         Lane Shope, MD. Go on 06/27/2024.   Specialty: General Surgery Why: Go to appointment on 08/26 at 200 PM Contact information: [address] [place] [address] [phone]         Melanee Annah BROCKS, MD Follow up in 1  week(s).   Specialty: Oncology Contact information: [address] [place] [address] [phone]                Discharge Exam: Fredricka Weights   06/16/24 1500 06/17/24 0314 06/21/24 0146  Weight: 110.4 kg 104.1 kg 108.4 kg   General exam: Appears calm and comfortable  Respiratory system: Clear to auscultation. Respiratory effort normal. Cardiovascular system: S1 & S2 heard, RRR. No JVD, murmurs, rubs, gallops or clicks. No pedal edema. Gastrointestinal system: Abdomen is nondistended, soft and nontender. No organomegaly or masses felt. Normal bowel sounds heard. Central nervous system: Alert and oriented. No focal neurological deficits. Extremities: Symmetric 5 x 5 power. Skin: No rashes, lesions or ulcers Psychiatry: Judgement and insight appear normal. Mood & affect appropriate.    Condition at discharge: good  The results of significant diagnostics from this hospitalization (including imaging, microbiology, ancillary and laboratory) are listed below for reference.   Imaging Studies: IR US  Guide Bx Asp/Drain Result Date: 06/19/2024 INDICATION: 73 year old male with cirrhosis, multifocal hepatocellular cancer and tumor thrombus within the portal veins. He also recently had acute cholecystitis and underwent partial cholecystectomy. New perihepatic fluid collection with associated leukocytosis. Assess for ascites, malignant ascites or infection. EXAM: US  IMAGE GUIDED DRAINAGE BY PERCUTANEOUS CATHETE MEDICATIONS: None. ANESTHESIA/SEDATION: None. COMPLICATIONS: None immediate. PROCEDURE: Informed written consent was obtained from the patient after a thorough discussion of the procedural risks, benefits and alternatives. All questions were addressed. Maximal Sterile Barrier Technique was utilized including caps, mask, sterile gowns, sterile gloves, sterile drape, hand hygiene and skin antiseptic. A timeout was performed prior to the initiation of the procedure. The right  upper quadrant was interrogated with ultrasound. There is a small amount of perihepatic fluid. A skin entry site was selected and marked. Local anesthesia was attained by infiltration with 1% lidocaine . A 5 [place] Yueh centesis catheter was then carefully advanced over the rib and into the fluid collection. Aspiration was performed yielding approximately 190 mL of golden ascitic fluid. Samples were sent for Gram stain and culture as well as cytology. IMPRESSION: Successful ultrasound-guided aspiration of small volume perihepatic fluid yielding 190 mL of golden colored ascites. Fluid sent for Gram stain and culture as well as cytology. Electronically Signed   By: Wilkie Lent M.D.   On: 06/19/2024 13:48   CT ABDOMEN PELVIS W CONTRAST Result Date: 06/17/2024 EXAM: CT ABDOMEN AND PELVIS WITH CONTRAST 06/17/2024 12:16:09 PM TECHNIQUE:  CT of the abdomen and pelvis was performed with the administration of intravenous contrast. Multiplanar reformatted images are provided for review. Automated exposure control, iterative reconstruction, and/or weight based adjustment of the mA/kV was utilized to reduce the radiation dose to as low as reasonably achievable. COMPARISON: 06/11/2024 CLINICAL HISTORY: Peritonitis or perforation suspected. Ok to drink per MD. R/o abscess. Delay of abdomen due to recent abdominal surgery. FINDINGS: LOWER CHEST: Pneumomediastinum, subjectively decreased from the previous exam. Bilateral lower lobe atelectasis, improved from previous exam. LIVER: Morphologic features of the liver compatible with cirrhosis. Multiple enhancing liver lesions which show washout on the delayed images compatible with multifocal hepatocellular carcinoma. The dominant lesion is seen in the inferior right lobe of liver measuring 9.2 x 6.8 cm, image 32/5. GALLBLADDER AND BILE DUCTS: Interval placement of common bile duct stent. Pneumobilia is identified compatible with biliary patency. Status post cholecystectomy.  Similar appearance of prominent gallbladder or cystic duct remnant with mild wall thickening and soft tissue stranding. SPLEEN: Numerous calcified granulomas identified throughout the spleen. The spleen has a normal size measuring 12.2 cm. PANCREAS: No main duct dilatation or mass. ADRENAL GLANDS: No acute abnormality. KIDNEYS, URETERS AND BLADDER: Bilateral kidney cysts. The largest arises off the lateral cortex of the right kidney measuring 8 x 7.0 cm, image 43/2. No follow up imaging recommended. No signs of obstructive uropathy. GI AND BOWEL: Moderate distention of the stomach. No pathologic dilatation of the large or small bowel loops to suggest a bowel obstruction. Mild wall thickening involving the cecum and ascending colon which is nonspecific and may reflect incomplete distention. Colitis or hepatic colopathy are also potential diagnostic considerations. Moderate desiccated stool identified within the rectum. PERITONEUM AND RETROPERITONEUM: A small volume of abdominal pelvic ascites is noted. No loculated fluid collections identified to suggest abscess.There are a few tiny, barely visible foci of gas within the undersurface of the ventral abdominal wall which are decreased compared with 06/11/2024. VASCULATURE: Enlarged recanalized umbilical vein is again noted with signs of thrombus within the proximal lumen, unchanged from 06/11/2024, image 21/2. At the level of the umbilicus the patent and opacified recanalized umbilical vein communicates with a dilated right epigastric vein which connects with the right common femoral vein. Aortic atherosclerosis. There is no aneurysm. LYMPH NODES: No signs of abdominal or pelvic adenopathy. REPRODUCTIVE ORGANS: No acute abnormality. BONES AND SOFT TISSUES: No acute osseous findings. There is a drainage catheter which enters the abdomen from the right lower quadrant and terminates over the anterior inferior aspect of the left hepatic lobe. IMPRESSION: 1. Small volume of  free fluid noted within the upper abdomen and pelvis. Increased from 06/11/2024. No abdominal abscess or perforation. 2. Mild wall thickening involving the cecum and ascending colon, nonspecific, possibly reflecting incomplete distention. Colitis or hepatic colopathy are also potential diagnostic considerations. 3. Morphologic features of the liver compatible with cirrhosis. 4. Multiple enhancing liver lesions compatible with multifocal hepatocellular carcinoma. 5. Interval placement of common bile duct stent with pneumobilia, 6. Status post cholecystectomy with prominent cystic duct/gallbladder remnant The remnant shows mild wall thickening with pericholecystic soft tissue stranding. 7. Enlarged recanalized umbilical vein with signs of thrombus within the proximal lumen, unchanged from 06/11/24. 8. Decreased pneumoperitoneum compared with 06/11/2024 Interval improvement in pneumomediastinum and Bibasilar Atelectasis. Electronically signed by: Waddell Calk MD 06/17/2024 01:05 PM EDT RP Workstation: [id number]   DG C-Arm 1-60 Min-No Report Result Date: 06/15/2024 Fluoroscopy was utilized by the requesting physician.  No radiographic interpretation.   IR US  LIVER  BIOPSY Result Date: 06/14/2024 INDICATION: 73 year old male with hepatic cirrhosis and multiple hepatic lesions as well as probable tumor thrombus EXAM: LIVER CORE BIOPSY MEDICATIONS: None. ANESTHESIA/SEDATION: Moderate (conscious) sedation was employed during this procedure. A total of Versed  1 mg and Fentanyl  50 mcg was administered intravenously. Moderate Sedation Time: 10 minutes. The patient's level of consciousness and vital signs were monitored continuously by radiology nursing throughout the procedure under my direct supervision. FLUOROSCOPY TIME:  None COMPLICATIONS: None immediate. PROCEDURE: Informed written consent was obtained from the patient after a thorough discussion of the procedural risks, benefits and alternatives. All questions were  addressed. Maximal Sterile Barrier Technique was utilized including caps, mask, sterile gowns, sterile gloves, sterile drape, hand hygiene and skin antiseptic. A timeout was performed prior to the initiation of the procedure. The right upper quadrant was interrogated with ultrasound. The liver is diffusely heterogeneous. A cluster of nodules is present inferiorly within hepatic segment 6. a suitable skin entry site was selected and marked. The region was sterilely prepped and draped in the standard fashion using chlorhexidine  skin prep. Local anesthesia was attained by infiltration with 1% lidocaine . A small dermatotomy was made. Under real-time sonographic guidance, a 17 gauge introducer needle was advanced into the margin of the mass. Multiple 18 gauge biopsies were then coaxially obtained using the BioPince automated biopsy device. Biopsy samples were placed in formalin and delivered to pathology for further analysis. As a 17 gauge introducer needle was removed, the biopsy tract was embolized with a Gel-Foam slurry. Post biopsy ultrasound imaging demonstrates no evidence of active hemorrhage or perihepatic hematoma. The patient tolerated the procedure well. IMPRESSION: Technically successful ultrasound-guided core biopsy of hepatic lesion. Electronically Signed   By: Wilkie Lent M.D.   On: 06/14/2024 16:17   NM HEPATOBILIARY LEAK (POST-SURGICAL) Addendum Date: 06/12/2024 ADDENDUM REPORT: 06/12/2024 18:13 ADDENDUM: Further information was provided by the surgeon. Subtotal cholecystectomy performed. The contained activity in the region of the cystic duct is favored to represent activity in the gallbladder remnant following partial resection. That being the case, no evidence of biliary leak. Electronically Signed   By: Jackquline Boxer M.D.   On: 06/12/2024 18:13   Result Date: 06/12/2024 CLINICAL DATA:  Post surgery pain. Surgical drain in place. Cholecystectomy 06/11/2019 EXAM: NUCLEAR MEDICINE  HEPATOBILIARY IMAGING TECHNIQUE: Sequential images of the abdomen were obtained out to 60 minutes following intravenous administration of radiopharmaceutical. RADIOPHARMACEUTICALS:  5.4 mCi Tc-35m  Choletec  IV COMPARISON:  CT 08/11/2024 FINDINGS: The surgical drain was clamped for the procedure. Prompt clearance radiotracer and homogeneous uptake in liver. Counts are evident in the common bile duct and small bowel by 30 minutes. At 60 minutes, a small focus of activity is noted in the region the gallbladder fossa. This activity increases in intensity over the next 30 minutes. The activity remains in the region of the cystic duct without disbursement.a IMPRESSION: 1. Concern for small contained bile leak at the level of the cystic duct remnant. 2. Patent common bile duct. Electronically Signed: By: Jackquline Boxer M.D. On: 06/12/2024 15:51   DG Chest Port 1 View Result Date: 06/11/2024 CLINICAL DATA:  Shortness of breath EXAM: PORTABLE CHEST 1 VIEW COMPARISON:  06/08/2024 CT FINDINGS: Cardiac shadow is stable. Lungs are well aerated bilaterally. Mild left retrocardiac atelectasis is seen. No effusion is noted. No bony abnormality is seen. IMPRESSION: Mild left retrocardiac atelectasis. Electronically Signed   By: Oneil Devonshire M.D.   On: 06/11/2024 16:58   CT Angio Abd/Pel w/ and/or w/o  Addendum Date: 06/11/2024 ADDENDUM REPORT: 06/11/2024 16:20 ADDENDUM: Critical Value/emergent results were called by telephone at the time of interpretation on 06/11/2024 at 4:07 pm to provider [facility] , who verbally acknowledged these results. Electronically Signed   By: Leita Birmingham M.D.   On: 06/11/2024 16:20   Result Date: 06/11/2024 CLINICAL DATA:  Portal hypertension. Retroperitoneal bleed suspected. Possible venous bleeding. Recanalized umbilical vein. EXAM: CTA ABDOMEN AND PELVIS WITHOUT AND WITH CONTRAST TECHNIQUE: Multidetector CT imaging of the abdomen and pelvis was performed using the standard protocol  during bolus administration of intravenous contrast. Multiplanar reconstructed images and MIPs were obtained and reviewed to evaluate the vascular anatomy. RADIATION DOSE REDUCTION: This exam was performed according to the departmental dose-optimization program which includes automated exposure control, adjustment of the mA and/or kV according to patient size and/or use of iterative reconstruction technique. CONTRAST:  OMNIPAQUE  IOHEXOL  350 MG/ML SOLN COMPARISON:  06/08/2024. FINDINGS: VASCULAR Aorta: Normal caliber aorta without aneurysm, dissection, vasculitis or significant stenosis. Aortic atherosclerosis. Celiac: Patent without evidence of aneurysm, dissection, vasculitis or significant stenosis. SMA: Patent without evidence of aneurysm, dissection, vasculitis or significant stenosis. Renals: Both renal arteries are patent without evidence of aneurysm, dissection, vasculitis, fibromuscular dysplasia. There is mild 2 moderate stenosis at the renal ostia bilaterally, unchanged. IMA: Patent without evidence of aneurysm, dissection, vasculitis or significant stenosis. Inflow: Patent without evidence of aneurysm, dissection, vasculitis or significant stenosis. Proximal Outflow: Bilateral common femoral and visualized portions of the superficial and profunda femoral arteries are patent without evidence of aneurysm, dissection, vasculitis or significant stenosis. Veins: There is a large recanalized umbilical vein. Filling defects are identified in in the recannulized umbilical vein suggesting nonocclusive thrombus. No active contrast extravasation is seen. Review of the MIP images confirms the above findings. NON-VASCULAR Lower chest: The heart is enlarged and there is a trace pericardial effusion. Multi-vessel coronary artery calcifications are noted. There are trace bilateral pleural effusions with consolidation at the lung bases. There is partial visualization of pneumomediastinum. The ascending aorta is  distended measuring 4.2 cm. Hepatobiliary: The liver has a lobular contour and enhances heterogeneously. There is an ill-defined 4.9 x 4.6 cm enhancing lesion in the left lobe of the liver, hepatic segment 4A. No biliary ductal dilatation. Previously described gallbladder stone is no longer seen. There is mild gallbladder wall thickening with surrounding fat stranding and perihepatic free fluid. Pancreas: Previously described lesions in the pancreas seen on MRI are not visualized on this exam. No pancreatic ductal dilatation or surrounding inflammatory changes. Spleen: Enlarged with multiple calcified granuloma. Adrenals/Urinary Tract: The adrenal glands are within normal limits. The kidneys enhance symmetrically. Renal cysts are noted bilaterally. No renal calculus or hydronephrosis. The bladder is unremarkable. Stomach/Bowel: There is a small hiatal hernia. The stomach is within normal limits. No bowel obstruction or pneumatosis is seen. A small amount of free air is present in the upper abdomen in dissecting into the anterior and left lateral abdominal wall and into the inguinal region on the left. A few scattered diverticular present along the colon without evidence of diverticulitis. Appendix is not seen. Lymphatic: Prominent lymph nodes are noted at the porta hepatis and gastrohepatic ligament, likely reactive. Reproductive: Prostate is unremarkable. Other: Mesenteric edema and a small amount of ascites is present in the abdomen and pelvis. A drain enters the right lower quadrant and terminates in the right upper quadrant. No acute hemorrhage is seen. Musculoskeletal: Degenerative changes are present in the thoracolumbar spine. No acute osseous abnormality. IMPRESSION: VASCULAR  1. No evidence of active hemorrhage. 2. Recannulized umbilical vein with suspected filling defects at the level liver suggesting nonocclusive thrombus. 3. 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 Recommend annual imaging followup by CTA or MRA. This recommendation follows 2010 ACCF/AHA/AATS/ACR/ASA/SCA/SCAI/SIR/STS/SVM Guidelines for the Diagnosis and Management of Patients with Thoracic Aortic Disease. Circulation. 2010; 121: [id number]. Aortic aneurysm NOS (ICD10-I71.9). 4. Remaining findings are unchanged. NON-VASCULAR 1. No evidence of acute hemorrhage. 2. Pneumomediastinum, small amount of free air in the upper abdomen, and air dissecting in the anterior and left lateral abdominal and pelvic wall which may be iatrogenic given interval placement of right upper quadrant drain. Correlate clinically to exclude the possibility viscus perforation. Chest radiograph is recommended to exclude the possibility of associated pneumothorax. 3. Small bilateral pleural effusions with basilar atelectasis or consolidation. 4. Morphologic changes of cirrhosis and portal hypertension. A large enhancing lesion is seen in the left lobe of the liver, previously characterized as possible hepatocellular carcinoma on recent MRI. 5. Mesenteric edema and mild anasarca. Electronically Signed: By: Leita Birmingham M.D. On: 06/11/2024 15:48   MR LIVER W WO CONTRAST Result Date: 06/09/2024 CLINICAL DATA:  Hypoechoic liver masses seen on US  and CT. EXAM: MRI ABDOMEN WITHOUT AND WITH CONTRAST TECHNIQUE: Multiplanar multisequence MR imaging of the abdomen was performed both before and after the administration of intravenous contrast. CONTRAST:  10mL GADAVIST  GADOBUTROL  1 MMOL/ML IV SOLN COMPARISON:  CT angiography chest, abdomen and pelvis from 06/08/2024. FINDINGS: Lower chest: There are linear areas of scarring/atelectasis in the visualized bilateral lungs. Otherwise unremarkable MR appearance to the lung bases. No pleural effusion. No pericardial effusion. Normal heart size. Hepatobiliary: There is cirrhotic liver morphology. There are innumerable mildly T2 hyperintense lesions throughout the liver occupying more than  25% of the total liver parenchyma. The lesions exhibit arterial hyperenhancement and washout on delayed/equilibrium phase images. Findings favor multifocal HCC. The left portal vein appears expanded with loss of signal void on T2 weighted images. However, there is no measurable contrast enhancement on the postcontrast images. Findings therefore favor acute probable bland thrombus. However, attention on follow-up examination is recommended. There is dilated and recanalized umbilical vein, suggesting sequela of portal hypertension. There is diminutive right portal vein however otherwise patent. The gallbladder is distended with width up to 5.9 cm. There is a stone in the gallbladder neck region. There is mild-to-moderate pericholecystic fat stranding and fluid, which is new since the prior study. In appropriate clinical setting, findings favor acute cholecystitis. No intra or extrahepatic bile duct dilation. No choledocholithiasis. Pancreas: There is a 7 x 10 mm T2 hyperintense nonenhancing structure in the pancreatic head. There are multiple additional subcentimeter sized T2 hyperintense nonenhancing structures in the uncinate process, head, neck and body region of the pancreas (marked with electronic arrow sign on series 3). There is no direct communication of these lesions with main pancreatic duct. 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 Spleen: Moderately enlarged spleen measuring 10.2 x 16.2 cm orthogonally on coronal plane. No focal lesion. Adrenals/Urinary Tract: Unremarkable adrenal glands. No hydroureteronephrosis. There are multiple simple cysts throughout bilateral kidneys with largest arising from the right kidney laterally measuring up to 7.4 x 7.6 cm. Stomach/Bowel: There is a tiny sliding hiatal hernia. Visualized portions within the abdomen are unremarkable. No disproportionate dilation of bowel loops. Vascular/Lymphatic: No pathologically enlarged  lymph nodes identified. No abdominal aortic aneurysm demonstrated. There is mild ascites mainly in the perihepatic  and perisplenic region. No walled-off abscess seen. Other:  None. Musculoskeletal: No suspicious bone lesions identified. IMPRESSION: 1. Cirrhotic liver morphology with innumerable lesions throughout the liver occupying more than 25% of the total liver parenchyma. Findings favor multifocal HCC. There is acute probable bland thrombus in the left portal vein. However, attention on follow-up examination is recommended. 2. There is a 7 x 10 mm T2 hyperintense nonenhancing structure in the pancreatic head. There are multiple additional subcentimeter sized T2 hyperintense nonenhancing structures in the uncinate process, head, neck and body region of the pancreas. 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 3. There is a stone in the gallbladder neck region. There is new, mild-to-moderate pericholecystic fat stranding and fluid. Findings favor acute cholecystitis. 4. There is mild ascites mainly in the perihepatic and perisplenic region. No walled-off abscess seen. 5. Multiple other nonacute observations, as described above. Electronically Signed   By: Ree Molt M.D.   On: 06/09/2024 16:32   ECHOCARDIOGRAM COMPLETE Result Date: 06/09/2024    ECHOCARDIOGRAM REPORT   Patient Name:   JAKAYDEN CANCIO [facility] Date of Exam: 06/09/2024 Medical Rec #:  [id number]       Height:       74.0 in Accession #:    [id number]      Weight:       244.0 lb Date of Birth:  [birth year]       BSA:          2.365 m Patient Age:    73 years        BP:           102/56 mmHg Patient Gender: M               HR:           84 bpm. Exam Location:  [facility] Procedure: 2D Echo, Cardiac Doppler and Color Doppler (Both Spectral and Color            Flow Doppler were utilized during procedure). Indications:     Chest pain R07.9  History:         Patient has no prior history of Echocardiogram examinations.                   Previous Myocardial Infarction; Risk Factors:Diabetes and                  Hypertension.  Sonographer:     Christopher Furnace Referring Phys:  [id number] CORT ONEIDA MANA Diagnosing Phys: Cara JONETTA Lovelace MD IMPRESSIONS  1. Left ventricular ejection fraction, by estimation, is 60 to 65%. The left ventricle has normal function. The left ventricle has no regional wall motion abnormalities. There is moderate asymmetric left ventricular hypertrophy of the septal segment. Left ventricular diastolic parameters are consistent with Grade I diastolic dysfunction (impaired relaxation).  2. Right ventricular systolic function is normal. The right ventricular size is normal. Mildly increased right ventricular wall thickness.  3. The mitral valve is normal in structure. Trivial mitral valve regurgitation.  4. The aortic valve is normal in structure. Aortic valve regurgitation is trivial. FINDINGS  Left Ventricle: Left ventricular ejection fraction, by estimation, is 60 to 65%. The left ventricle has normal function. The left ventricle has no regional wall motion abnormalities. Strain was performed and the global longitudinal strain is indeterminate. The left ventricular internal cavity size was small. There is moderate asymmetric left ventricular hypertrophy of the septal segment. Left ventricular diastolic parameters are consistent with  Grade I diastolic dysfunction (impaired relaxation). Right Ventricle: The right ventricular size is normal. Mildly increased right ventricular wall thickness. Right ventricular systolic function is normal. Left Atrium: Left atrial size was normal in size. Right Atrium: Right atrial size was normal in size. Pericardium: There is no evidence of pericardial effusion. Mitral Valve: The mitral valve is normal in structure. Trivial mitral valve regurgitation. MV peak gradient, 4.8 mmHg. The mean mitral valve gradient is 2.0 mmHg. Tricuspid Valve: The tricuspid valve is normal in structure. Tricuspid  valve regurgitation is mild. Aortic Valve: The aortic valve is normal in structure. Aortic valve regurgitation is trivial. Aortic valve mean gradient measures 5.0 mmHg. Aortic valve peak gradient measures 8.5 mmHg. Aortic valve area, by VTI measures 5.01 cm. Pulmonic Valve: The pulmonic valve was normal in structure. Pulmonic valve regurgitation is not visualized. Aorta: The ascending aorta was not well visualized. IAS/Shunts: No atrial level shunt detected by color flow Doppler. Additional Comments: 3D was performed not requiring image post processing on an independent workstation and was indeterminate.  LEFT VENTRICLE PLAX 2D LVIDd:         4.20 cm   Diastology LVIDs:         2.80 cm   LV e' medial:    6.42 cm/s LV PW:         1.20 cm   LV E/e' medial:  8.8 LV IVS:        1.90 cm   LV e' lateral:   8.92 cm/s LVOT diam:     2.30 cm   LV E/e' lateral: 6.3 LV SV:         120 LV SV Index:   51 LVOT Area:     4.15 cm  RIGHT VENTRICLE RV Basal diam:  3.30 cm RV Mid diam:    2.60 cm LEFT ATRIUM             Index        RIGHT ATRIUM           Index LA diam:        4.10 cm 1.73 cm/m   RA Area:     10.70 cm LA Vol (A2C):   45.5 ml 19.24 ml/m  RA Volume:   19.10 ml  8.08 ml/m LA Vol (A4C):   27.7 ml 11.71 ml/m LA Biplane Vol: 38.4 ml 16.24 ml/m  AORTIC VALVE AV Area (Vmax):    4.10 cm AV Area (Vmean):   4.10 cm AV Area (VTI):     5.01 cm AV Vmax:           146.00 cm/s AV Vmean:          107.500 cm/s AV VTI:            0.240 m AV Peak Grad:      8.5 mmHg AV Mean Grad:      5.0 mmHg LVOT Vmax:         144.00 cm/s LVOT Vmean:        106.000 cm/s LVOT VTI:          0.290 m LVOT/AV VTI ratio: 1.21  AORTA Ao Root diam: 3.60 cm MITRAL VALVE               TRICUSPID VALVE MV Area (PHT): 2.10 cm    TR Peak grad:   9.1 mmHg MV Area VTI:   4.56 cm    TR Vmax:        151.00 cm/s MV Peak grad:  4.8 mmHg MV Mean grad:  2.0 mmHg    SHUNTS MV Vmax:       1.09 m/s    Systemic VTI:  0.29 m MV Vmean:      59.8 cm/s   Systemic Diam:  2.30 cm MV Decel Time: 361 msec MV E velocity: 56.60 cm/s MV A velocity: 87.00 cm/s MV E/A ratio:  0.65 Dwayne D Callwood MD Electronically signed by Cara JONETTA Lovelace MD Signature Date/Time: 06/09/2024/1:47:37 PM    Final    US  ABDOMEN LIMITED WITH LIVER DOPPLER Result Date: 06/08/2024 CLINICAL DATA:  Abdominal pain, jaundice EXAM: DUPLEX ULTRASOUND OF LIVER TECHNIQUE: Color and duplex Doppler ultrasound was performed to evaluate the hepatic in-flow and out-flow vessels. COMPARISON:  06/08/2024, 02/17/2022 FINDINGS: Liver: Liver displays diffusely heterogeneous liver echotexture, with multiple hyperechoic masses throughout the liver parenchyma. Index mass within the anterior right lobe liver measures 2.5 x 2.3 x 2.4 cm. Given ultrasound and recent CT findings, further evaluation with dedicated liver CT or MRI is recommended. Liver is enlarged measuring 17.6 cm in the midclavicular line. The gallbladder is moderately distended, with 2.2 cm shadowing gallstone within the gallbladder neck. Gallbladder wall measures up to 4 mm in maximal thickness. Main Portal Vein size: 1.7 cm Portal Vein Velocities Main Prox:  11.9 cm/sec Main Mid: Occluded Main Dist: Occluded Right: Occluded Left: Occluded Hepatic Vein Velocities Right:  19.5 cm/sec Middle:  19.3 cm/sec Left:  24.7 cm/sec Normal directional flow, though waveforms are pulsatile and turbulent. IVC: Present and patent with normal respiratory phasicity. Hepatic Artery Velocity:  41.3 cm/sec Splenic Vein Velocity:  12.2 cm/sec Spleen: 16.5 cm x 8.5 cm x 17.8 cm with a total volume of 1306.9 cm^3 (411 cm^3 is upper limit normal) Portal Vein Occlusion/Thrombus: Yes, occlusion Splenic Vein Occlusion/Thrombus: No Ascites: None Varices: Large recanalized umbilical vein is noted, with echogenic thrombus near the liver. IMPRESSION: 1. Heterogeneous liver echotexture with multiple hyperechoic masses as above. Further evaluation with dedicated liver MRI is recommended when  clinical situation permits. 2. Portal vein occlusion as above, with only the proximal aspect of the main portal vein demonstrating patency on Doppler evaluation. 3. Large recanalized umbilical vein, with echogenic thrombus near the liver. 4. Marked splenomegaly. Electronically Signed   By: Ozell Daring M.D.   On: 06/08/2024 16:08   CT Angio Chest/Abd/Pel for Dissection W and/or Wo Contrast Result Date: 06/08/2024 EXAM: CTA CHEST, ABDOMEN AND PELVIS WITH AND WITHOUT CONTRAST 06/08/2024 08:19:56 AM TECHNIQUE: CTA of the chest was performed with and without the administration of intravenous contrast. CTA of the abdomen and pelvis was performed with and without the administration of intravenous contrast. Multiplanar reformatted images are provided for review. MIP images are provided for review. Automated exposure control, iterative reconstruction, and/or weight based adjustment of the mA/kV was utilized to reduce the radiation dose to as low as reasonably achievable. COMPARISON: None available. CLINICAL HISTORY: Acute aortic syndrome (AAS) suspected. Patient ambulatory to triage with steady gait, without difficulty or distress noted; pt reports awakening at 3am with generalized abd pain accomp by N/V; denies hx of same; Hx of heart stents. 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 There is moderate calcific plaque present throughout the thoracic aorta. The descending thoracic aorta is tortuous. The proximal descending thoracic aorta measures approximately 3.5 cm in diameter and the distal descending thoracic aorta approximately 3.1 cm in  diameter. There is no evidence of acute aortic syndrome. PULMONARY ARTERIES: No pulmonary embolism with the limits of this exam. GREAT VESSELS OF AORTIC ARCH: No acute finding. No dissection. No arterial occlusion or significant stenosis.  CELIAC TRUNK: No acute finding. No occlusion or significant stenosis. SUPERIOR MESENTERIC ARTERY: No acute finding. No occlusion or significant stenosis. The mesenteric arteries are widely patent. INFERIOR MESENTERIC ARTERY: No acute finding. No occlusion or significant stenosis. RENAL ARTERIES: There is mild stenosis of the origins of the renal arteries bilaterally, worse on the right, which is approximately 50% stenotic. 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 CHEST: MEDIASTINUM: The heart is mildly enlarged and there is moderate to severe calcific coronary artery disease. LUNGS AND PLEURA: There are ground-glass and reticular opacities present dependently within the lungs bilaterally. The nondependent lungs are clear. No evidence of pleural effusion or pneumothorax. THORACIC BONES AND SOFT TISSUES: No acute bone or soft tissue abnormality. ABDOMEN AND PELVIS: LIVER: There is heterogeneous enhancement of the liver and cirrhotic morphology. Hepatic lesions cannot be excluded given the heterogeneous attenuation. There is recanalization of the umbilical vein. GALLBLADDER AND BILE DUCTS: The gallbladder is moderately distended, measuring greater than 13 cm in length and greater than 5 cm in transverse dimension. There is a calcified stone layering dependently within the gallbladder. There is no definite evidence of cholecystitis. SPLEEN: There is moderate splenomegaly. The spleen measures approximately 16 cm in length. PANCREAS: The pancreas is unremarkable. ADRENAL GLANDS: Bilateral adrenal glands demonstrate no acute abnormality. KIDNEYS, URETERS AND BLADDER: There are simple renal cysts present bilaterally, including an exophytic cyst on the right measuring nearly 8 cm in diameter. No follow up of the cysts is necessary. No stones in the kidneys or ureters. No hydronephrosis. No perinephric or periureteral stranding.  Urinary bladder is unremarkable. GI AND BOWEL: There is a small sliding hiatus hernia. There are few sigmoid diverticula, but no evidence of diverticulitis. Stomach and duodenal sweep demonstrate no acute abnormality. There is no bowel obstruction. No abnormal bowel wall thickening or distension. REPRODUCTIVE: Reproductive organs are unremarkable. PERITONEUM AND RETROPERITONEUM: No ascites or free air. LYMPH NODES: No lymphadenopathy. ABDOMINAL BONES AND SOFT TISSUES: No acute abnormality of the bones. No acute soft tissue abnormality. IMPRESSION: 1. No evidence of acute aortic syndrome. 2. 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 3. Moderate calcific plaque throughout the thoracic aorta. 4. Mild stenosis of the origins of the renal arteries bilaterally, worse on the right (approximately 50% stenotic). 5. Heterogeneous enhancement of the liver with cirrhotic morphology. Hepatic lesions cannot be excluded. Follow up MRI of the liver without and with gadolinium contrast is suggested. 6. Moderate splenomegaly, measuring approximately 16 cm in length. 7. Small sliding hiatus hernia. Electronically signed by: evalene coho 06/08/2024 09:19 AM EDT RP Workstation: [id number]    Microbiology: Results for orders placed or performed during the hospital encounter of 06/08/24  MRSA Next Gen by PCR, Nasal     Status: None   Collection Time: 06/09/24  1:09 PM   Specimen: Nasal Mucosa; Nasal Swab  Result Value Ref Range Status   MRSA by PCR Next Gen NOT DETECTED NOT DETECTED Final    Comment: (NOTE) The GeneXpert MRSA Assay (FDA approved for NASAL specimens only), is one component of a comprehensive MRSA colonization surveillance  program. It is not intended to diagnose MRSA infection nor to guide or monitor treatment for MRSA infections. Test performance is  not FDA approved in patients less than 104 years old. Performed at [facility], [address] [place], [place] [address]   Culture, blood (Routine X 2) w Reflex to ID Panel     Status: None   Collection Time: 06/09/24  1:11 PM   Specimen: BLOOD LEFT ARM  Result Value Ref Range Status   Specimen Description BLOOD LEFT ARM  Final   Special Requests   Final    BOTTLES DRAWN AEROBIC AND ANAEROBIC Blood Culture adequate volume   Culture   Final    NO GROWTH 5 DAYS Performed at [facility], [address] [place], [place] [address]    Report Status 06/14/2024 FINAL  Final  Culture, blood (Routine X 2) w Reflex to ID Panel     Status: None   Collection Time: 06/09/24  1:18 PM   Specimen: BLOOD LEFT HAND  Result Value Ref Range Status   Specimen Description BLOOD LEFT HAND  Final   Special Requests   Final    BOTTLES DRAWN AEROBIC AND ANAEROBIC Blood Culture adequate volume   Culture   Final    NO GROWTH 5 DAYS Performed at [facility], [address] [place], [place] [address]    Report Status 06/14/2024 FINAL  Final  Aerobic/Anaerobic Culture w Gram Stain (surgical/deep wound)     Status: None (Preliminary result)   Collection Time: 06/19/24 11:35 AM   Specimen: Peritoneal Washings; Abdominal Fluid  Result Value Ref Range Status   Specimen Description   Final    PERITONEAL Performed at [facility], [address], [place], [place] [address]    Special Requests   Final    Normal Performed at [facility], [address] [place], [place] [address]    Gram Stain   Final    ABUNDANT WBC PRESENT, PREDOMINANTLY PMN NO ORGANISMS SEEN    Culture   Final    NO GROWTH 2 DAYS Performed at [facility], [address], [place], [place] [address]    Report Status PENDING  Incomplete  Culture, fungus without smear     Status: None (Preliminary result)   Collection Time: 06/19/24 11:35 AM   Specimen: Peritoneal Washings;  Peritoneal Fluid  Result Value Ref Range Status   Specimen Description   Final    PERITONEAL Performed at [facility], [address], [place], [place] [address]    Special Requests   Final    NONE Performed at [facility], [address], [place], [place] [address]    Culture   Final    NO FUNGUS ISOLATED AFTER 2 DAYS Performed at [facility], [address] [place], [place] [address]    Report Status PENDING  Incomplete    Labs: CBC: Recent Labs  Lab 06/16/24 0338 06/17/24 0437 06/18/24 0604 06/19/24 0448 06/20/24 0328 06/21/24 0332  WBC 26.6* 45.0* 42.9* 40.2* 33.4* 29.5*  NEUTROABS 9.4* 12.8*  --   --  11.5* 8.2*  HGB 15.1 16.1 15.8 14.5 14.5 14.5  HCT 44.0 45.5 44.6 42.8 45.0 42.1  MCV 99.1 97.0 99.6 100.5* 105.6* 99.3  PLT 147* 186 159 171 147* 196   Basic Metabolic Panel: Recent Labs  Lab 06/15/24 0417 06/16/24 0338 06/17/24 0437 06/18/24 0604 06/19/24 0448 06/20/24 0328 06/21/24 0332  NA 140   < >  138 135 138 136 136  K 4.4   < > 4.3 4.3 4.1 4.6 5.0  CL 109   < > 109 108 104 106 105  CO2 22   < > 22 22 25  21* 25  GLUCOSE 114*   < > 137* 171* 135* 145* 120*  BUN 24*   < > 30* 23 26* 26* 24*  CREATININE 0.95   < > 1.01 0.84 1.09 0.80 1.21  CALCIUM  8.3*   < > 8.3* 8.1* 8.4* 8.4* 8.5*  MG 2.0  --   --   --   --   --   --   PHOS 3.9  --   --   --   --   --   --    < > = values in this interval not displayed.   Liver Function Tests: Recent Labs  Lab 06/17/24 0437 06/18/24 0604 06/19/24 0448 06/20/24 0328 06/21/24 0332  AST 63* 41 42* 47* 62*  ALT 67* 55* 48* 50* 59*  ALKPHOS 76 58 59 66 89  BILITOT 1.3* 1.8* 1.3*  1.3* 1.1 1.4*  PROT 5.3* 4.6* 4.7* 4.7* 4.9*  ALBUMIN 2.3* 1.9* 1.8* 1.8* 1.9*   CBG: Recent Labs  Lab 06/20/24 1207 06/20/24 1549 06/20/24 2027 06/21/24 0827 06/21/24 1121  GLUCAP 186* 145* 128* 126* 175*    Discharge time spent: 35 minutes.  Signed: Murvin Mana, MD [facility] 06/21/2024

## 2024-06-22 ENCOUNTER — Telehealth: Payer: Self-pay | Admitting: Oncology

## 2024-06-22 ENCOUNTER — Other Ambulatory Visit

## 2024-06-22 ENCOUNTER — Other Ambulatory Visit: Payer: Self-pay

## 2024-06-22 ENCOUNTER — Other Ambulatory Visit: Payer: Self-pay | Admitting: Physician Assistant

## 2024-06-22 DIAGNOSIS — K838 Other specified diseases of biliary tract: Secondary | ICD-10-CM

## 2024-06-22 MED ORDER — METHOCARBAMOL 500 MG PO TABS: 500.0000 mg | ORAL_TABLET | Freq: Three times a day (TID) | ORAL | 0 refills | Status: AC | PRN

## 2024-06-22 MED ORDER — TRAMADOL HCL 50 MG PO TABS: 50.0000 mg | ORAL_TABLET | Freq: Four times a day (QID) | ORAL | 0 refills | Status: AC | PRN

## 2024-06-22 NOTE — Telephone Encounter (Signed)
 Patients wife called and was very unhappy with me for switching pt over to Dr. Jacobo. Before switching him over, I had spoken to the patient and explained to him that since he was already an established pt with Dr. Jacobo we would need to have his hospital follow up with Dr. Jacobo. The husband seemed to understand this and agreed to the change. His wife then called and was very unhappy and said they are supposed to be seeing Dr. Melanee and not Dr. Jacobo and I explained that the same thing to his wife and she adamant that he was to see Melanee and not finnegan. She said that they did not have a problem with Georgina but that they didn't know him and wanted Dr. Melanee. I told the wife I would let the team know. Please advise

## 2024-06-23 ENCOUNTER — Inpatient Hospital Stay: Attending: Oncology | Admitting: Oncology

## 2024-06-23 NOTE — Telephone Encounter (Signed)
 Nicholas Abbott- please call patient/ wife and explain the situation to them.

## 2024-06-23 NOTE — Telephone Encounter (Signed)
 Discussed with Dr. Melanee clarifying the patient has always been seen and  Dr. Jacobo. Dr. Melanee was consulted on a weekend while she was on call and visited patient in the hospital; provider visited again with patient in hospital while she was on call and was unaware that the patient was currently established with Dr. Jacobo.  Respectfully it was the decision for patient to continue seeing Dr. Jacobo since he already is an established patient with him. Spoke to [place], informed of above. Reviewed visit history w/ Dr. Jacobo; 09/10/22 first OV for thrombocytopenia and then video visits after that.  Alisa also mentioned that Dr. Melanee and them discussed second opinions while in the hospital.  Reviewed upcoming appt w/ Dr. Jacobo this Monday @ 3pm; spouse verbalized understanding.

## 2024-06-24 LAB — AEROBIC/ANAEROBIC CULTURE W GRAM STAIN (SURGICAL/DEEP WOUND)
Culture: NO GROWTH
Special Requests: NORMAL

## 2024-06-26 ENCOUNTER — Encounter: Payer: Self-pay | Admitting: Oncology

## 2024-06-26 ENCOUNTER — Inpatient Hospital Stay: Attending: Oncology | Admitting: Oncology

## 2024-06-26 VITALS — BP 115/79 | HR 95 | Temp 97.7°F | Resp 16 | Wt 228.0 lb

## 2024-06-26 DIAGNOSIS — J982 Interstitial emphysema: Secondary | ICD-10-CM | POA: Diagnosis not present

## 2024-06-26 DIAGNOSIS — R531 Weakness: Secondary | ICD-10-CM | POA: Diagnosis not present

## 2024-06-26 DIAGNOSIS — K7460 Unspecified cirrhosis of liver: Secondary | ICD-10-CM | POA: Diagnosis not present

## 2024-06-26 DIAGNOSIS — Z79890 Hormone replacement therapy (postmenopausal): Secondary | ICD-10-CM | POA: Insufficient documentation

## 2024-06-26 DIAGNOSIS — I700 Atherosclerosis of aorta: Secondary | ICD-10-CM | POA: Insufficient documentation

## 2024-06-26 DIAGNOSIS — R109 Unspecified abdominal pain: Secondary | ICD-10-CM | POA: Diagnosis not present

## 2024-06-26 DIAGNOSIS — Z85828 Personal history of other malignant neoplasm of skin: Secondary | ICD-10-CM | POA: Insufficient documentation

## 2024-06-26 DIAGNOSIS — I81 Portal vein thrombosis: Secondary | ICD-10-CM | POA: Diagnosis not present

## 2024-06-26 DIAGNOSIS — Z79899 Other long term (current) drug therapy: Secondary | ICD-10-CM | POA: Insufficient documentation

## 2024-06-26 DIAGNOSIS — E785 Hyperlipidemia, unspecified: Secondary | ICD-10-CM | POA: Diagnosis not present

## 2024-06-26 DIAGNOSIS — C220 Liver cell carcinoma: Secondary | ICD-10-CM | POA: Diagnosis present

## 2024-06-26 DIAGNOSIS — I82890 Acute embolism and thrombosis of other specified veins: Secondary | ICD-10-CM | POA: Diagnosis not present

## 2024-06-26 DIAGNOSIS — J90 Pleural effusion, not elsewhere classified: Secondary | ICD-10-CM | POA: Diagnosis not present

## 2024-06-26 DIAGNOSIS — D696 Thrombocytopenia, unspecified: Secondary | ICD-10-CM | POA: Diagnosis present

## 2024-06-26 DIAGNOSIS — N281 Cyst of kidney, acquired: Secondary | ICD-10-CM | POA: Insufficient documentation

## 2024-06-26 DIAGNOSIS — K8000 Calculus of gallbladder with acute cholecystitis without obstruction: Secondary | ICD-10-CM | POA: Insufficient documentation

## 2024-06-26 DIAGNOSIS — I7121 Aneurysm of the ascending aorta, without rupture: Secondary | ICD-10-CM | POA: Insufficient documentation

## 2024-06-26 DIAGNOSIS — I119 Hypertensive heart disease without heart failure: Secondary | ICD-10-CM | POA: Insufficient documentation

## 2024-06-26 DIAGNOSIS — Z7901 Long term (current) use of anticoagulants: Secondary | ICD-10-CM | POA: Insufficient documentation

## 2024-06-26 DIAGNOSIS — K449 Diaphragmatic hernia without obstruction or gangrene: Secondary | ICD-10-CM | POA: Insufficient documentation

## 2024-06-26 DIAGNOSIS — I252 Old myocardial infarction: Secondary | ICD-10-CM | POA: Diagnosis not present

## 2024-06-26 DIAGNOSIS — R188 Other ascites: Secondary | ICD-10-CM | POA: Insufficient documentation

## 2024-06-26 DIAGNOSIS — K766 Portal hypertension: Secondary | ICD-10-CM | POA: Insufficient documentation

## 2024-06-26 DIAGNOSIS — I082 Rheumatic disorders of both aortic and tricuspid valves: Secondary | ICD-10-CM | POA: Insufficient documentation

## 2024-06-26 DIAGNOSIS — I3139 Other pericardial effusion (noninflammatory): Secondary | ICD-10-CM | POA: Insufficient documentation

## 2024-06-26 DIAGNOSIS — Z7982 Long term (current) use of aspirin: Secondary | ICD-10-CM | POA: Diagnosis not present

## 2024-06-26 MED ORDER — PROCHLORPERAZINE MALEATE 10 MG PO TABS: 10.0000 mg | ORAL_TABLET | Freq: Four times a day (QID) | ORAL | 1 refills | Status: DC | PRN

## 2024-06-26 MED ORDER — LIDOCAINE-PRILOCAINE 2.5-2.5 % EX CREA: TOPICAL_CREAM | CUTANEOUS | 3 refills | Status: DC

## 2024-06-26 NOTE — Telephone Encounter (Signed)
 Procedure and blood thinner clearance faxed

## 2024-06-26 NOTE — Progress Notes (Signed)
 START ON PATHWAY REGIMEN - Hepatobiliary     A cycle is every 21 days:     Atezolizumab      Bevacizumab-xxxx   **Always confirm dose/schedule in your pharmacy ordering system**  Patient Characteristics: Hepatocellular Carcinoma, Unresectable or Nonsurgical Candidate, Locally Advanced or Metastatic Disease Not Amenable to Locoregional Therapy, Systemic Therapy, First Line, No Prior Transplant and Candidate for Immunotherapy Hepatobiliary Disease Type: Hepatocellular Carcinoma Line of Therapy: First Line Intent of Therapy: Non-Curative / Palliative Intent, Discussed with Patient

## 2024-06-26 NOTE — Progress Notes (Unsigned)
 [facility]  [phone] [phone]  ID: Nicholas Abbott OB: [birth year]  MR#: [id number]  RDW#:[id number]  Patient Care Team: Sherial Bail, MD as PCP - General (Internal Medicine) Jacobo Evalene PARAS, MD as Consulting Physician (Oncology)   CHIEF COMPLAINT: Multifocal hepatocellular carcinoma.  INTERVAL HISTORY: Patient returns to clinic today for hospital follow-up for further evaluation, discussion of his final pathology results, and treatment planning.  He continues to have weakness and fatigue, but this is mildly improved.  He continues to have abdominal discomfort. He has no neurologic complaints.  He denies any recent fevers.  He has a fair appetite.  He has no chest pain, shortness of breath, cough, or hemoptysis.  He denies any nausea, vomiting, constipation, or diarrhea.  He has no urinary complaints.  Patient offers no further specific complaints today.  REVIEW OF SYSTEMS:   Review of Systems  Constitutional:  Positive for malaise/fatigue. Negative for fever and weight loss.  Respiratory: Negative.  Negative for cough, hemoptysis and shortness of breath.   Cardiovascular: Negative.  Negative for chest pain and leg swelling.  Gastrointestinal:  Positive for abdominal pain.  Genitourinary: Negative.  Negative for dysuria.  Musculoskeletal:  Negative for back pain.  Skin: Negative.  Negative for rash.  Neurological:  Positive for weakness. Negative for dizziness, focal weakness and headaches.  Endo/Heme/Allergies:  Does not bruise/bleed easily.  Psychiatric/Behavioral: Negative.  The patient is not nervous/anxious.     As per HPI. Otherwise, a complete review of systems is negative.  PAST MEDICAL HISTORY: Past Medical History:  Diagnosis Date   Basal cell carcinoma    L ear, txted in past   Diabetes mellitus without complication (HCC)    GERD (gastroesophageal reflux disease)    Hyperlipidemia    Hypertension    Left shoulder  pain    Myocardial infarction (HCC)     PAST SURGICAL HISTORY: Past Surgical History:  Procedure Laterality Date   BILIARY STENT PLACEMENT  06/15/2024   Procedure: INSERTION, STENT, BILE DUCT;  Surgeon: Jinny Carmine, MD;  Location: [facility] ENDOSCOPY;  Service: Endoscopy;;   CATARACT EXTRACTION     COLONOSCOPY WITH PROPOFOL  N/A 01/01/2020   Procedure: COLONOSCOPY WITH PROPOFOL ;  Surgeon: [name], [name], MD;  Location: [facility] ENDOSCOPY;  Service: Gastroenterology;  Laterality: N/A;   CORONARY ANGIOPLASTY     ERCP N/A 06/15/2024   Procedure: ERCP, WITH INTERVENTION IF INDICATED;  Surgeon: Jinny Carmine, MD;  Location: [facility] ENDOSCOPY;  Service: Endoscopy;  Laterality: N/A;   ESOPHAGOGASTRODUODENOSCOPY (EGD) WITH PROPOFOL  N/A 01/01/2020   Procedure: ESOPHAGOGASTRODUODENOSCOPY (EGD) WITH PROPOFOL ;  Surgeon: [name], [name], MD;  Location: [facility] ENDOSCOPY;  Service: Gastroenterology;  Laterality: N/A;   EYE SURGERY     HOT HEMOSTASIS  06/15/2024   Procedure: EGD, WITH ARGON PLASMA COAGULATION;  Surgeon: Jinny Carmine, MD;  Location: [facility] ENDOSCOPY;  Service: Endoscopy;;   IR US  GUIDE BX ASP/DRAIN  06/19/2024   IR US  LIVER BIOPSY  06/14/2024   LEFT HEART CATH AND CORONARY ANGIOGRAPHY N/A 04/29/2022   Procedure: LEFT HEART CATH AND CORONARY ANGIOGRAPHY;  Surgeon: [name], MD;  Location: [facility] INVASIVE CV LAB;  Service: Cardiovascular;  Laterality: N/A;   RETINAL DETACHMENT SURGERY      FAMILY HISTORY: Family History  Problem Relation Age of Onset   Leukemia Father     ADVANCED DIRECTIVES (Y/N):  N  HEALTH MAINTENANCE: Social History   Tobacco Use   Smoking status: Never   Smokeless tobacco: Never  Vaping Use  Vaping status: Never Used  Substance Use Topics   Alcohol use: Yes    Alcohol/week: 5.0 standard drinks of alcohol    Types: 5 Shots of liquor per week   Drug use: Never     Colonoscopy:  PAP:  Bone density:  Lipid panel:  Allergies  Allergen Reactions    Pioglitazone Rash    Current Outpatient Medications  Medication Sig Dispense Refill   acetaminophen  (TYLENOL ) 500 MG tablet Take 1,000 mg by mouth every 8 (eight) hours as needed.     apixaban  (ELIQUIS ) 5 MG TABS tablet Take 2 tablets (10 mg total) by mouth 2 (two) times daily for 5 days, THEN 1 tablet (5 mg total) 2 (two) times daily. 100 tablet 0   aspirin  EC 81 MG tablet Take 81 mg by mouth daily.     Blood Glucose Monitoring Suppl DEVI 1 each by Does not apply route in the morning, at noon, and at bedtime. May substitute to any manufacturer covered by patient's insurance. 1 each 0   Cinnamon 500 MG capsule Take 1,000 mg by mouth daily.     Cyanocobalamin  (VITAMIN B-12) 5000 MCG TBDP Take 5,000 mcg by mouth daily.      empagliflozin  (JARDIANCE ) 10 MG TABS tablet Take 1 tablet by mouth daily with breakfast.     ezetimibe (ZETIA) 10 MG tablet Take 10 mg by mouth daily.     fluconazole  (DIFLUCAN ) 200 MG tablet Take 2 tablets (400 mg total) by mouth daily for 3 days. 6 tablet 0   folic acid -pyridoxine -cyancobalamin (FOLTX) 2.5-25-2 MG TABS tablet Take 1 tablet by mouth daily.     hydrocortisone  2.5 % cream APPLY TO THE GROIN DAILY ON MONDAY, WEDNESDAY, AND FRIDAY. 28 g 1   ketoconazole  (NIZORAL ) 2 % cream APPLY TO THE GROIN AT BEDTIME 60 g 11   levothyroxine  (SYNTHROID ) 112 MCG tablet Take 112 mcg by mouth daily.     liothyronine  (CYTOMEL ) 5 MCG tablet Take 5 mcg by mouth every morning.     methocarbamol  (ROBAXIN ) 500 MG tablet Take 1 tablet (500 mg total) by mouth every 8 (eight) hours as needed for muscle spasms. 30 tablet 0   nitroGLYCERIN  (NITROSTAT ) 0.4 MG SL tablet Place 0.4 mg under the tongue every 5 (five) minutes as needed for chest pain.     PHOSPHATIDYLSERINE PO Take by mouth.     rosuvastatin  (CRESTOR ) 40 MG tablet Take by mouth.     tadalafil (CIALIS) 5 MG tablet Take 5 mg by mouth daily as needed for erectile dysfunction.     traMADol  (ULTRAM ) 50 MG tablet Take 1-2 tablets  (50-100 mg total) by mouth every 6 (six) hours as needed for severe pain (pain score 7-10) or moderate pain (pain score 4-6). 30 tablet 0   amoxicillin -clavulanate (AUGMENTIN ) 875-125 MG tablet Take 1 tablet by mouth 2 (two) times daily for 3 days. (Patient not taking: Reported on 06/26/2024) 6 tablet 0   No current facility-administered medications for this visit.    OBJECTIVE: Vitals:   06/26/24 1453  BP: 115/79  Pulse: 95  Resp: 16  Temp: 97.7 F (36.5 C)  SpO2: 95%      Body mass index is 29.27 kg/m.    ECOG FS:1 - Symptomatic but completely ambulatory  General: Well-developed, well-nourished, no acute distress.  Sitting in a wheelchair. Eyes: Pink conjunctiva, anicteric sclera. HEENT: Normocephalic, moist mucous membranes. Lungs: No audible wheezing or coughing. Heart: Regular rate and rhythm. Abdomen: Soft, nontender, no obvious distention. Musculoskeletal:  No edema, cyanosis, or clubbing. Neuro: Alert, answering all questions appropriately. Cranial nerves grossly intact. Skin: No rashes or petechiae noted. Psych: Normal affect.  LAB RESULTS:  Lab Results  Component Value Date   NA 136 06/21/2024   K 5.0 06/21/2024   CL 105 06/21/2024   CO2 25 06/21/2024   GLUCOSE 120 (H) 06/21/2024   BUN 24 (H) 06/21/2024   CREATININE 1.21 06/21/2024   CALCIUM  8.5 (L) 06/21/2024   PROT 4.9 (L) 06/21/2024   ALBUMIN 1.9 (L) 06/21/2024   AST 62 (H) 06/21/2024   ALT 59 (H) 06/21/2024   ALKPHOS 89 06/21/2024   BILITOT 1.4 (H) 06/21/2024   GFRNONAA >60 06/21/2024   GFRAA >60 01/31/2020    Lab Results  Component Value Date   WBC 29.5 (H) 06/21/2024   NEUTROABS 8.2 (H) 06/21/2024   HGB 14.5 06/21/2024   HCT 42.1 06/21/2024   MCV 99.3 06/21/2024   PLT 196 06/21/2024   Lab Results  Component Value Date   IRON 109 09/10/2022   TIBC 242 (L) 09/10/2022   IRONPCTSAT 45 (H) 09/10/2022   Lab Results  Component Value Date   FERRITIN 301 09/10/2022     STUDIES: IR US   Guide Bx Asp/Drain Result Date: 06/19/2024 INDICATION: 73 year old male with cirrhosis, multifocal hepatocellular cancer and tumor thrombus within the portal veins. He also recently had acute cholecystitis and underwent partial cholecystectomy. New perihepatic fluid collection with associated leukocytosis. Assess for ascites, malignant ascites or infection. EXAM: US  IMAGE GUIDED DRAINAGE BY PERCUTANEOUS CATHETE MEDICATIONS: None. ANESTHESIA/SEDATION: None. COMPLICATIONS: None immediate. PROCEDURE: Informed written consent was obtained from the patient after a thorough discussion of the procedural risks, benefits and alternatives. All questions were addressed. Maximal Sterile Barrier Technique was utilized including caps, mask, sterile gowns, sterile gloves, sterile drape, hand hygiene and skin antiseptic. A timeout was performed prior to the initiation of the procedure. The right upper quadrant was interrogated with ultrasound. There is a small amount of perihepatic fluid. A skin entry site was selected and marked. Local anesthesia was attained by infiltration with 1% lidocaine . A 5 [place] Yueh centesis catheter was then carefully advanced over the rib and into the fluid collection. Aspiration was performed yielding approximately 190 mL of golden ascitic fluid. Samples were sent for Gram stain and culture as well as cytology. IMPRESSION: Successful ultrasound-guided aspiration of small volume perihepatic fluid yielding 190 mL of golden colored ascites. Fluid sent for Gram stain and culture as well as cytology. Electronically Signed   By: Wilkie Lent M.D.   On: 06/19/2024 13:48   CT ABDOMEN PELVIS W CONTRAST Result Date: 06/17/2024 EXAM: CT ABDOMEN AND PELVIS WITH CONTRAST 06/17/2024 12:16:09 PM TECHNIQUE: CT of the abdomen and pelvis was performed with the administration of intravenous contrast. Multiplanar reformatted images are provided for review. Automated exposure control, iterative reconstruction,  and/or weight based adjustment of the mA/kV was utilized to reduce the radiation dose to as low as reasonably achievable. COMPARISON: 06/11/2024 CLINICAL HISTORY: Peritonitis or perforation suspected. Ok to drink per MD. R/o abscess. Delay of abdomen due to recent abdominal surgery. FINDINGS: LOWER CHEST: Pneumomediastinum, subjectively decreased from the previous exam. Bilateral lower lobe atelectasis, improved from previous exam. LIVER: Morphologic features of the liver compatible with cirrhosis. Multiple enhancing liver lesions which show washout on the delayed images compatible with multifocal hepatocellular carcinoma. The dominant lesion is seen in the inferior right lobe of liver measuring 9.2 x 6.8 cm, image 32/5. GALLBLADDER AND BILE DUCTS: Interval placement of common  bile duct stent. Pneumobilia is identified compatible with biliary patency. Status post cholecystectomy. Similar appearance of prominent gallbladder or cystic duct remnant with mild wall thickening and soft tissue stranding. SPLEEN: Numerous calcified granulomas identified throughout the spleen. The spleen has a normal size measuring 12.2 cm. PANCREAS: No main duct dilatation or mass. ADRENAL GLANDS: No acute abnormality. KIDNEYS, URETERS AND BLADDER: Bilateral kidney cysts. The largest arises off the lateral cortex of the right kidney measuring 8 x 7.0 cm, image 43/2. No follow up imaging recommended. No signs of obstructive uropathy. GI AND BOWEL: Moderate distention of the stomach. No pathologic dilatation of the large or small bowel loops to suggest a bowel obstruction. Mild wall thickening involving the cecum and ascending colon which is nonspecific and may reflect incomplete distention. Colitis or hepatic colopathy are also potential diagnostic considerations. Moderate desiccated stool identified within the rectum. PERITONEUM AND RETROPERITONEUM: A small volume of abdominal pelvic ascites is noted. No loculated fluid collections  identified to suggest abscess.There are a few tiny, barely visible foci of gas within the undersurface of the ventral abdominal wall which are decreased compared with 06/11/2024. VASCULATURE: Enlarged recanalized umbilical vein is again noted with signs of thrombus within the proximal lumen, unchanged from 06/11/2024, image 21/2. At the level of the umbilicus the patent and opacified recanalized umbilical vein communicates with a dilated right epigastric vein which connects with the right common femoral vein. Aortic atherosclerosis. There is no aneurysm. LYMPH NODES: No signs of abdominal or pelvic adenopathy. REPRODUCTIVE ORGANS: No acute abnormality. BONES AND SOFT TISSUES: No acute osseous findings. There is a drainage catheter which enters the abdomen from the right lower quadrant and terminates over the anterior inferior aspect of the left hepatic lobe. IMPRESSION: 1. Small volume of free fluid noted within the upper abdomen and pelvis. Increased from 06/11/2024. No abdominal abscess or perforation. 2. Mild wall thickening involving the cecum and ascending colon, nonspecific, possibly reflecting incomplete distention. Colitis or hepatic colopathy are also potential diagnostic considerations. 3. Morphologic features of the liver compatible with cirrhosis. 4. Multiple enhancing liver lesions compatible with multifocal hepatocellular carcinoma. 5. Interval placement of common bile duct stent with pneumobilia, 6. Status post cholecystectomy with prominent cystic duct/gallbladder remnant The remnant shows mild wall thickening with pericholecystic soft tissue stranding. 7. Enlarged recanalized umbilical vein with signs of thrombus within the proximal lumen, unchanged from 06/11/24. 8. Decreased pneumoperitoneum compared with 06/11/2024 Interval improvement in pneumomediastinum and Bibasilar Atelectasis. Electronically signed by: Waddell Calk MD 06/17/2024 01:05 PM EDT RP Workstation: [id number]   DG C-Arm 1-60 Min-No  Report Result Date: 06/15/2024 Fluoroscopy was utilized by the requesting physician.  No radiographic interpretation.   IR US  LIVER BIOPSY Result Date: 06/14/2024 INDICATION: 73 year old male with hepatic cirrhosis and multiple hepatic lesions as well as probable tumor thrombus EXAM: LIVER CORE BIOPSY MEDICATIONS: None. ANESTHESIA/SEDATION: Moderate (conscious) sedation was employed during this procedure. A total of Versed  1 mg and Fentanyl  50 mcg was administered intravenously. Moderate Sedation Time: 10 minutes. The patient's level of consciousness and vital signs were monitored continuously by radiology nursing throughout the procedure under my direct supervision. FLUOROSCOPY TIME:  None COMPLICATIONS: None immediate. PROCEDURE: Informed written consent was obtained from the patient after a thorough discussion of the procedural risks, benefits and alternatives. All questions were addressed. Maximal Sterile Barrier Technique was utilized including caps, mask, sterile gowns, sterile gloves, sterile drape, hand hygiene and skin antiseptic. A timeout was performed prior to the initiation of the procedure. The right upper  quadrant was interrogated with ultrasound. The liver is diffusely heterogeneous. A cluster of nodules is present inferiorly within hepatic segment 6. a suitable skin entry site was selected and marked. The region was sterilely prepped and draped in the standard fashion using chlorhexidine  skin prep. Local anesthesia was attained by infiltration with 1% lidocaine . A small dermatotomy was made. Under real-time sonographic guidance, a 17 gauge introducer needle was advanced into the margin of the mass. Multiple 18 gauge biopsies were then coaxially obtained using the BioPince automated biopsy device. Biopsy samples were placed in formalin and delivered to pathology for further analysis. As a 17 gauge introducer needle was removed, the biopsy tract was embolized with a Gel-Foam slurry. Post biopsy  ultrasound imaging demonstrates no evidence of active hemorrhage or perihepatic hematoma. The patient tolerated the procedure well. IMPRESSION: Technically successful ultrasound-guided core biopsy of hepatic lesion. Electronically Signed   By: Wilkie Lent M.D.   On: 06/14/2024 16:17   NM HEPATOBILIARY LEAK (POST-SURGICAL) Addendum Date: 06/12/2024 ADDENDUM REPORT: 06/12/2024 18:13 ADDENDUM: Further information was provided by the surgeon. Subtotal cholecystectomy performed. The contained activity in the region of the cystic duct is favored to represent activity in the gallbladder remnant following partial resection. That being the case, no evidence of biliary leak. Electronically Signed   By: Jackquline Boxer M.D.   On: 06/12/2024 18:13   Result Date: 06/12/2024 CLINICAL DATA:  Post surgery pain. Surgical drain in place. Cholecystectomy 06/11/2019 EXAM: NUCLEAR MEDICINE HEPATOBILIARY IMAGING TECHNIQUE: Sequential images of the abdomen were obtained out to 60 minutes following intravenous administration of radiopharmaceutical. RADIOPHARMACEUTICALS:  5.4 mCi Tc-22m  Choletec  IV COMPARISON:  CT 08/11/2024 FINDINGS: The surgical drain was clamped for the procedure. Prompt clearance radiotracer and homogeneous uptake in liver. Counts are evident in the common bile duct and small bowel by 30 minutes. At 60 minutes, a small focus of activity is noted in the region the gallbladder fossa. This activity increases in intensity over the next 30 minutes. The activity remains in the region of the cystic duct without disbursement.a IMPRESSION: 1. Concern for small contained bile leak at the level of the cystic duct remnant. 2. Patent common bile duct. Electronically Signed: By: Jackquline Boxer M.D. On: 06/12/2024 15:51   DG Chest Port 1 View Result Date: 06/11/2024 CLINICAL DATA:  Shortness of breath EXAM: PORTABLE CHEST 1 VIEW COMPARISON:  06/08/2024 CT FINDINGS: Cardiac shadow is stable. Lungs are well aerated  bilaterally. Mild left retrocardiac atelectasis is seen. No effusion is noted. No bony abnormality is seen. IMPRESSION: Mild left retrocardiac atelectasis. Electronically Signed   By: Oneil Devonshire M.D.   On: 06/11/2024 16:58   CT Angio Abd/Pel w/ and/or w/o Addendum Date: 06/11/2024 ADDENDUM REPORT: 06/11/2024 16:20 ADDENDUM: Critical Value/emergent results were called by telephone at the time of interpretation on 06/11/2024 at 4:07 pm to provider [facility] , who verbally acknowledged these results. Electronically Signed   By: Leita Birmingham M.D.   On: 06/11/2024 16:20   Result Date: 06/11/2024 CLINICAL DATA:  Portal hypertension. Retroperitoneal bleed suspected. Possible venous bleeding. Recanalized umbilical vein. EXAM: CTA ABDOMEN AND PELVIS WITHOUT AND WITH CONTRAST TECHNIQUE: Multidetector CT imaging of the abdomen and pelvis was performed using the standard protocol during bolus administration of intravenous contrast. Multiplanar reconstructed images and MIPs were obtained and reviewed to evaluate the vascular anatomy. RADIATION DOSE REDUCTION: This exam was performed according to the departmental dose-optimization program which includes automated exposure control, adjustment of the mA and/or kV according to patient  size and/or use of iterative reconstruction technique. CONTRAST:  OMNIPAQUE  IOHEXOL  350 MG/ML SOLN COMPARISON:  06/08/2024. FINDINGS: VASCULAR Aorta: Normal caliber aorta without aneurysm, dissection, vasculitis or significant stenosis. Aortic atherosclerosis. Celiac: Patent without evidence of aneurysm, dissection, vasculitis or significant stenosis. SMA: Patent without evidence of aneurysm, dissection, vasculitis or significant stenosis. Renals: Both renal arteries are patent without evidence of aneurysm, dissection, vasculitis, fibromuscular dysplasia. There is mild 2 moderate stenosis at the renal ostia bilaterally, unchanged. IMA: Patent without evidence of aneurysm, dissection,  vasculitis or significant stenosis. Inflow: Patent without evidence of aneurysm, dissection, vasculitis or significant stenosis. Proximal Outflow: Bilateral common femoral and visualized portions of the superficial and profunda femoral arteries are patent without evidence of aneurysm, dissection, vasculitis or significant stenosis. Veins: There is a large recanalized umbilical vein. Filling defects are identified in in the recannulized umbilical vein suggesting nonocclusive thrombus. No active contrast extravasation is seen. Review of the MIP images confirms the above findings. NON-VASCULAR Lower chest: The heart is enlarged and there is a trace pericardial effusion. Multi-vessel coronary artery calcifications are noted. There are trace bilateral pleural effusions with consolidation at the lung bases. There is partial visualization of pneumomediastinum. The ascending aorta is distended measuring 4.2 cm. Hepatobiliary: The liver has a lobular contour and enhances heterogeneously. There is an ill-defined 4.9 x 4.6 cm enhancing lesion in the left lobe of the liver, hepatic segment 4A. No biliary ductal dilatation. Previously described gallbladder stone is no longer seen. There is mild gallbladder wall thickening with surrounding fat stranding and perihepatic free fluid. Pancreas: Previously described lesions in the pancreas seen on MRI are not visualized on this exam. No pancreatic ductal dilatation or surrounding inflammatory changes. Spleen: Enlarged with multiple calcified granuloma. Adrenals/Urinary Tract: The adrenal glands are within normal limits. The kidneys enhance symmetrically. Renal cysts are noted bilaterally. No renal calculus or hydronephrosis. The bladder is unremarkable. Stomach/Bowel: There is a small hiatal hernia. The stomach is within normal limits. No bowel obstruction or pneumatosis is seen. A small amount of free air is present in the upper abdomen in dissecting into the anterior and left  lateral abdominal wall and into the inguinal region on the left. A few scattered diverticular present along the colon without evidence of diverticulitis. Appendix is not seen. Lymphatic: Prominent lymph nodes are noted at the porta hepatis and gastrohepatic ligament, likely reactive. Reproductive: Prostate is unremarkable. Other: Mesenteric edema and a small amount of ascites is present in the abdomen and pelvis. A drain enters the right lower quadrant and terminates in the right upper quadrant. No acute hemorrhage is seen. Musculoskeletal: Degenerative changes are present in the thoracolumbar spine. No acute osseous abnormality. IMPRESSION: VASCULAR 1. No evidence of active hemorrhage. 2. Recannulized umbilical vein with suspected filling defects at the level liver suggesting nonocclusive thrombus. 3. 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 Recommend annual imaging followup by CTA or MRA. This recommendation follows 2010 ACCF/AHA/AATS/ACR/ASA/SCA/SCAI/SIR/STS/SVM Guidelines for the Diagnosis and Management of Patients with Thoracic Aortic Disease. Circulation. 2010; 121: Z733-z630. Aortic aneurysm NOS (ICD10-I71.9). 4. Remaining findings are unchanged. NON-VASCULAR 1. No evidence of acute hemorrhage. 2. Pneumomediastinum, small amount of free air in the upper abdomen, and air dissecting in the anterior and left lateral abdominal and pelvic wall which may be iatrogenic given interval placement of right upper quadrant drain. Correlate clinically to exclude the possibility viscus perforation. Chest radiograph is recommended to exclude the possibility of associated pneumothorax. 3. Small bilateral pleural effusions  with basilar atelectasis or consolidation. 4. Morphologic changes of cirrhosis and portal hypertension. A large enhancing lesion is seen in the left lobe of the liver, previously characterized as possible hepatocellular carcinoma on recent MRI. 5. Mesenteric  edema and mild anasarca. Electronically Signed: By: Leita Birmingham M.D. On: 06/11/2024 15:48   MR LIVER W WO CONTRAST Result Date: 06/09/2024 CLINICAL DATA:  Hypoechoic liver masses seen on US  and CT. EXAM: MRI ABDOMEN WITHOUT AND WITH CONTRAST TECHNIQUE: Multiplanar multisequence MR imaging of the abdomen was performed both before and after the administration of intravenous contrast. CONTRAST:  10mL GADAVIST  GADOBUTROL  1 MMOL/ML IV SOLN COMPARISON:  CT angiography chest, abdomen and pelvis from 06/08/2024. FINDINGS: Lower chest: There are linear areas of scarring/atelectasis in the visualized bilateral lungs. Otherwise unremarkable MR appearance to the lung bases. No pleural effusion. No pericardial effusion. Normal heart size. Hepatobiliary: There is cirrhotic liver morphology. There are innumerable mildly T2 hyperintense lesions throughout the liver occupying more than 25% of the total liver parenchyma. The lesions exhibit arterial hyperenhancement and washout on delayed/equilibrium phase images. Findings favor multifocal HCC. The left portal vein appears expanded with loss of signal void on T2 weighted images. However, there is no measurable contrast enhancement on the postcontrast images. Findings therefore favor acute probable bland thrombus. However, attention on follow-up examination is recommended. There is dilated and recanalized umbilical vein, suggesting sequela of portal hypertension. There is diminutive right portal vein however otherwise patent. The gallbladder is distended with width up to 5.9 cm. There is a stone in the gallbladder neck region. There is mild-to-moderate pericholecystic fat stranding and fluid, which is new since the prior study. In appropriate clinical setting, findings favor acute cholecystitis. No intra or extrahepatic bile duct dilation. No choledocholithiasis. Pancreas: There is a 7 x 10 mm T2 hyperintense nonenhancing structure in the pancreatic head. There are multiple  additional subcentimeter sized T2 hyperintense nonenhancing structures in the uncinate process, head, neck and body region of the pancreas (marked with electronic arrow sign on series 3). There is no direct communication of these lesions with main pancreatic duct. 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 Spleen: Moderately enlarged spleen measuring 10.2 x 16.2 cm orthogonally on coronal plane. No focal lesion. Adrenals/Urinary Tract: Unremarkable adrenal glands. No hydroureteronephrosis. There are multiple simple cysts throughout bilateral kidneys with largest arising from the right kidney laterally measuring up to 7.4 x 7.6 cm. Stomach/Bowel: There is a tiny sliding hiatal hernia. Visualized portions within the abdomen are unremarkable. No disproportionate dilation of bowel loops. Vascular/Lymphatic: No pathologically enlarged lymph nodes identified. No abdominal aortic aneurysm demonstrated. There is mild ascites mainly in the perihepatic and perisplenic region. No walled-off abscess seen. Other:  None. Musculoskeletal: No suspicious bone lesions identified. IMPRESSION: 1. Cirrhotic liver morphology with innumerable lesions throughout the liver occupying more than 25% of the total liver parenchyma. Findings favor multifocal HCC. There is acute probable bland thrombus in the left portal vein. However, attention on follow-up examination is recommended. 2. There is a 7 x 10 mm T2 hyperintense nonenhancing structure in the pancreatic head. There are multiple additional subcentimeter sized T2 hyperintense nonenhancing structures in the uncinate process, head, neck and body region of the pancreas. 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 3. There is a stone in the gallbladder neck region. There is new, mild-to-moderate pericholecystic fat stranding and fluid. Findings favor acute cholecystitis. 4. There is mild ascites  mainly in the  perihepatic and perisplenic region. No walled-off abscess seen. 5. Multiple other nonacute observations, as described above. Electronically Signed   By: Ree Molt M.D.   On: 06/09/2024 16:32   ECHOCARDIOGRAM COMPLETE Result Date: 06/09/2024    ECHOCARDIOGRAM REPORT   Patient Name:   ELWOOD BAZINET [facility] Date of Exam: 06/09/2024 Medical Rec #:  [id number]       Height:       74.0 in Accession #:    [id number]      Weight:       244.0 lb Date of Birth:  08-01-1951       BSA:          2.365 m Patient Age:    73 years        BP:           102/56 mmHg Patient Gender: M               HR:           84 bpm. Exam Location:  [facility] Procedure: 2D Echo, Cardiac Doppler and Color Doppler (Both Spectral and Color            Flow Doppler were utilized during procedure). Indications:     Chest pain R07.9  History:         Patient has no prior history of Echocardiogram examinations.                  Previous Myocardial Infarction; Risk Factors:Diabetes and                  Hypertension.  Sonographer:     Christopher Furnace Referring Phys:  [id number] CORT ONEIDA MANA Diagnosing Phys: Cara JONETTA Lovelace MD IMPRESSIONS  1. Left ventricular ejection fraction, by estimation, is 60 to 65%. The left ventricle has normal function. The left ventricle has no regional wall motion abnormalities. There is moderate asymmetric left ventricular hypertrophy of the septal segment. Left ventricular diastolic parameters are consistent with Grade I diastolic dysfunction (impaired relaxation).  2. Right ventricular systolic function is normal. The right ventricular size is normal. Mildly increased right ventricular wall thickness.  3. The mitral valve is normal in structure. Trivial mitral valve regurgitation.  4. The aortic valve is normal in structure. Aortic valve regurgitation is trivial. FINDINGS  Left Ventricle: Left ventricular ejection fraction, by estimation, is 60 to 65%. The left ventricle has normal function. The left ventricle has no regional  wall motion abnormalities. Strain was performed and the global longitudinal strain is indeterminate. The left ventricular internal cavity size was small. There is moderate asymmetric left ventricular hypertrophy of the septal segment. Left ventricular diastolic parameters are consistent with Grade I diastolic dysfunction (impaired relaxation). Right Ventricle: The right ventricular size is normal. Mildly increased right ventricular wall thickness. Right ventricular systolic function is normal. Left Atrium: Left atrial size was normal in size. Right Atrium: Right atrial size was normal in size. Pericardium: There is no evidence of pericardial effusion. Mitral Valve: The mitral valve is normal in structure. Trivial mitral valve regurgitation. MV peak gradient, 4.8 mmHg. The mean mitral valve gradient is 2.0 mmHg. Tricuspid Valve: The tricuspid valve is normal in structure. Tricuspid valve regurgitation is mild. Aortic Valve: The aortic valve is normal in structure. Aortic valve regurgitation is trivial. Aortic valve mean gradient measures 5.0 mmHg. Aortic valve peak gradient measures 8.5 mmHg. Aortic valve area, by VTI measures 5.01 cm. Pulmonic Valve: The pulmonic valve was normal in structure. Pulmonic valve  regurgitation is not visualized. Aorta: The ascending aorta was not well visualized. IAS/Shunts: No atrial level shunt detected by color flow Doppler. Additional Comments: 3D was performed not requiring image post processing on an independent workstation and was indeterminate.  LEFT VENTRICLE PLAX 2D LVIDd:         4.20 cm   Diastology LVIDs:         2.80 cm   LV e' medial:    6.42 cm/s LV PW:         1.20 cm   LV E/e' medial:  8.8 LV IVS:        1.90 cm   LV e' lateral:   8.92 cm/s LVOT diam:     2.30 cm   LV E/e' lateral: 6.3 LV SV:         120 LV SV Index:   51 LVOT Area:     4.15 cm  RIGHT VENTRICLE RV Basal diam:  3.30 cm RV Mid diam:    2.60 cm LEFT ATRIUM             Index        RIGHT ATRIUM            Index LA diam:        4.10 cm 1.73 cm/m   RA Area:     10.70 cm LA Vol (A2C):   45.5 ml 19.24 ml/m  RA Volume:   19.10 ml  8.08 ml/m LA Vol (A4C):   27.7 ml 11.71 ml/m LA Biplane Vol: 38.4 ml 16.24 ml/m  AORTIC VALVE AV Area (Vmax):    4.10 cm AV Area (Vmean):   4.10 cm AV Area (VTI):     5.01 cm AV Vmax:           146.00 cm/s AV Vmean:          107.500 cm/s AV VTI:            0.240 m AV Peak Grad:      8.5 mmHg AV Mean Grad:      5.0 mmHg LVOT Vmax:         144.00 cm/s LVOT Vmean:        106.000 cm/s LVOT VTI:          0.290 m LVOT/AV VTI ratio: 1.21  AORTA Ao Root diam: 3.60 cm MITRAL VALVE               TRICUSPID VALVE MV Area (PHT): 2.10 cm    TR Peak grad:   9.1 mmHg MV Area VTI:   4.56 cm    TR Vmax:        151.00 cm/s MV Peak grad:  4.8 mmHg MV Mean grad:  2.0 mmHg    SHUNTS MV Vmax:       1.09 m/s    Systemic VTI:  0.29 m MV Vmean:      59.8 cm/s   Systemic Diam: 2.30 cm MV Decel Time: 361 msec MV E velocity: 56.60 cm/s MV A velocity: 87.00 cm/s MV E/A ratio:  0.65 Dwayne D Callwood MD Electronically signed by Cara JONETTA Lovelace MD Signature Date/Time: 06/09/2024/1:47:37 PM    Final    US  ABDOMEN LIMITED WITH LIVER DOPPLER Result Date: 06/08/2024 CLINICAL DATA:  Abdominal pain, jaundice EXAM: DUPLEX ULTRASOUND OF LIVER TECHNIQUE: Color and duplex Doppler ultrasound was performed to evaluate the hepatic in-flow and out-flow vessels. COMPARISON:  06/08/2024, 02/17/2022 FINDINGS: Liver: Liver displays diffusely heterogeneous liver echotexture, with multiple hyperechoic  masses throughout the liver parenchyma. Index mass within the anterior right lobe liver measures 2.5 x 2.3 x 2.4 cm. Given ultrasound and recent CT findings, further evaluation with dedicated liver CT or MRI is recommended. Liver is enlarged measuring 17.6 cm in the midclavicular line. The gallbladder is moderately distended, with 2.2 cm shadowing gallstone within the gallbladder neck. Gallbladder wall measures up to 4 mm in maximal  thickness. Main Portal Vein size: 1.7 cm Portal Vein Velocities Main Prox:  11.9 cm/sec Main Mid: Occluded Main Dist: Occluded Right: Occluded Left: Occluded Hepatic Vein Velocities Right:  19.5 cm/sec Middle:  19.3 cm/sec Left:  24.7 cm/sec Normal directional flow, though waveforms are pulsatile and turbulent. IVC: Present and patent with normal respiratory phasicity. Hepatic Artery Velocity:  41.3 cm/sec Splenic Vein Velocity:  12.2 cm/sec Spleen: 16.5 cm x 8.5 cm x 17.8 cm with a total volume of 1306.9 cm^3 (411 cm^3 is upper limit normal) Portal Vein Occlusion/Thrombus: Yes, occlusion Splenic Vein Occlusion/Thrombus: No Ascites: None Varices: Large recanalized umbilical vein is noted, with echogenic thrombus near the liver. IMPRESSION: 1. Heterogeneous liver echotexture with multiple hyperechoic masses as above. Further evaluation with dedicated liver MRI is recommended when clinical situation permits. 2. Portal vein occlusion as above, with only the proximal aspect of the main portal vein demonstrating patency on Doppler evaluation. 3. Large recanalized umbilical vein, with echogenic thrombus near the liver. 4. Marked splenomegaly. Electronically Signed   By: Ozell Daring M.D.   On: 06/08/2024 16:08   CT Angio Chest/Abd/Pel for Dissection W and/or Wo Contrast Result Date: 06/08/2024 EXAM: CTA CHEST, ABDOMEN AND PELVIS WITH AND WITHOUT CONTRAST 06/08/2024 08:19:56 AM TECHNIQUE: CTA of the chest was performed with and without the administration of intravenous contrast. CTA of the abdomen and pelvis was performed with and without the administration of intravenous contrast. Multiplanar reformatted images are provided for review. MIP images are provided for review. Automated exposure control, iterative reconstruction, and/or weight based adjustment of the mA/kV was utilized to reduce the radiation dose to as low as reasonably achievable. COMPARISON: None available. CLINICAL HISTORY: Acute aortic syndrome (AAS)  suspected. Patient ambulatory to triage with steady gait, without difficulty or distress noted; pt reports awakening at 3am with generalized abd pain accomp by N/V; denies hx of same; Hx of heart stents. 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 There is moderate calcific plaque present throughout the thoracic aorta. The descending thoracic aorta is tortuous. The proximal descending thoracic aorta measures approximately 3.5 cm in diameter and the distal descending thoracic aorta approximately 3.1 cm in diameter. There is no evidence of acute aortic syndrome. PULMONARY ARTERIES: No pulmonary embolism with the limits of this exam. GREAT VESSELS OF AORTIC ARCH: No acute finding. No dissection. No arterial occlusion or significant stenosis. CELIAC TRUNK: No acute finding. No occlusion or significant stenosis. SUPERIOR MESENTERIC ARTERY: No acute finding. No occlusion or significant stenosis. The mesenteric arteries are widely patent. INFERIOR MESENTERIC ARTERY: No acute finding. No occlusion or significant stenosis. RENAL ARTERIES: There is mild stenosis of the origins of the renal arteries bilaterally, worse on the right, which is approximately 50% stenotic. 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 CHEST: MEDIASTINUM: The heart is mildly enlarged and there is moderate to severe calcific coronary artery disease. LUNGS AND PLEURA: There  are ground-glass and reticular opacities present dependently within the lungs bilaterally. The nondependent lungs are clear. No evidence of pleural effusion or pneumothorax. THORACIC BONES AND SOFT TISSUES: No acute bone or soft tissue abnormality. ABDOMEN AND PELVIS: LIVER: There is heterogeneous enhancement of the liver and cirrhotic morphology.  Hepatic lesions cannot be excluded given the heterogeneous attenuation. There is recanalization of the umbilical vein. GALLBLADDER AND BILE DUCTS: The gallbladder is moderately distended, measuring greater than 13 cm in length and greater than 5 cm in transverse dimension. There is a calcified stone layering dependently within the gallbladder. There is no definite evidence of cholecystitis. SPLEEN: There is moderate splenomegaly. The spleen measures approximately 16 cm in length. PANCREAS: The pancreas is unremarkable. ADRENAL GLANDS: Bilateral adrenal glands demonstrate no acute abnormality. KIDNEYS, URETERS AND BLADDER: There are simple renal cysts present bilaterally, including an exophytic cyst on the right measuring nearly 8 cm in diameter. No follow up of the cysts is necessary. No stones in the kidneys or ureters. No hydronephrosis. No perinephric or periureteral stranding. Urinary bladder is unremarkable. GI AND BOWEL: There is a small sliding hiatus hernia. There are few sigmoid diverticula, but no evidence of diverticulitis. Stomach and duodenal sweep demonstrate no acute abnormality. There is no bowel obstruction. No abnormal bowel wall thickening or distension. REPRODUCTIVE: Reproductive organs are unremarkable. PERITONEUM AND RETROPERITONEUM: No ascites or free air. LYMPH NODES: No lymphadenopathy. ABDOMINAL BONES AND SOFT TISSUES: No acute abnormality of the bones. No acute soft tissue abnormality. IMPRESSION: 1. No evidence of acute aortic syndrome. 2. 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 3. Moderate calcific plaque throughout the thoracic aorta. 4. Mild stenosis of the origins of the renal arteries bilaterally, worse on the right (approximately 50% stenotic). 5. Heterogeneous enhancement of the liver with cirrhotic morphology. Hepatic lesions  cannot be excluded. Follow up MRI of the liver without and with gadolinium contrast is suggested. 6. Moderate splenomegaly, measuring approximately 16 cm in length. 7. Small sliding hiatus hernia. Electronically signed by: evalene coho 06/08/2024 09:19 AM EDT RP Workstation: [id number]    ASSESSMENT: Multifocal hepatocellular carcinoma.  PLAN:    Multifocal hepatocellular carcinoma: Diagnosed by biopsy on June 14, 2024.  Patient has a mildly elevated AFP at 21.5 as well as elevated CA 19-9 of 45.  MRI results from June 09, 2024 revealed innumerable lesions throughout the liver as well as a 7 x 10 mm lesion in the head of the pancreas of unclear clinical significance.  Patient wishes to proceed treatment and will require port placement prior to initiating therapy.  Plan to use Tecentriq and Avastin every 3 weeks until progression of disease.  Return to clinic on July 05, 2024 to initiate cycle 1. History of thrombocytopenia: Patient's most recent platelet count was reported at 196. 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  The clinical significance of this is unclear.  Previously, flow cytometry revealed a clonal population that has ranged from 24% to 61% of lymphocytes.  The CD103+, CD11+, CD25-, CD5- and CD10- population is not typical for CLL, mantle cell, or hairy cell leukemia.  The CD103 phenotype can be partially expressed and an atypical hairy cell leukemia.  No intervention is needed.  Continue to monitor on a yearly basis.  Return to clinic as above.  I spent a total of 30 minutes reviewing chart data, face-to-face evaluation with the  patient, counseling and coordination of care as detailed above.    Patient expressed understanding and was in agreement with this plan. He also understands that He can call clinic at any time with any questions, concerns, or complaints.    Evalene JINNY Reusing, MD   06/26/2024 3:06 PM

## 2024-06-27 ENCOUNTER — Ambulatory Visit: Payer: Self-pay | Admitting: Surgery

## 2024-06-27 ENCOUNTER — Ambulatory Visit (INDEPENDENT_AMBULATORY_CARE_PROVIDER_SITE_OTHER): Admitting: Surgery

## 2024-06-27 ENCOUNTER — Telehealth: Payer: Self-pay

## 2024-06-27 ENCOUNTER — Encounter: Payer: Self-pay | Admitting: Oncology

## 2024-06-27 ENCOUNTER — Other Ambulatory Visit: Payer: Self-pay

## 2024-06-27 ENCOUNTER — Encounter: Payer: Self-pay | Admitting: Surgery

## 2024-06-27 ENCOUNTER — Other Ambulatory Visit: Payer: Self-pay | Admitting: Oncology

## 2024-06-27 VITALS — BP 113/75 | HR 99 | Temp 97.8°F | Ht 74.0 in | Wt 225.4 lb

## 2024-06-27 DIAGNOSIS — C220 Liver cell carcinoma: Secondary | ICD-10-CM

## 2024-06-27 NOTE — Progress Notes (Signed)
 Patient ID: Nicholas Abbott, male   DOB: 1951-09-30, 73 y.o.   MRN: [id number]  Chief Complaint: Follow-up after hospitalization for acute cholecystitis with hepatocellular carcinoma with cirrhosis and persistent ascitic drainage.  History of Present Illness Nicholas Abbott is a 73 y.o. male with presentation follow-up still having his right upper quadrant Jackson-Pratt drain which is draining alternating serous or cloudy serous fluid.  Still with relatively large outputs.  He denies any history of fever or chills.  Has seen Dr. Phenergan and is being scheduled for initiation of Avastin.  Last Eliquis  dose was this morning.  Past Medical History Past Medical History:  Diagnosis Date   Basal cell carcinoma    L ear, txted in past   Diabetes mellitus without complication (HCC)    GERD (gastroesophageal reflux disease)    Hyperlipidemia    Hypertension    Left shoulder pain    Myocardial infarction [facility])       Past Surgical History:  Procedure Laterality Date   BILIARY STENT PLACEMENT  06/15/2024   Procedure: INSERTION, STENT, BILE DUCT;  Surgeon: Jinny Carmine, MD;  Location: [facility] ENDOSCOPY;  Service: Endoscopy;;   CATARACT EXTRACTION     COLONOSCOPY WITH PROPOFOL  N/A 01/01/2020   Procedure: COLONOSCOPY WITH PROPOFOL ;  Surgeon: [name], [name], MD;  Location: [facility] ENDOSCOPY;  Service: Gastroenterology;  Laterality: N/A;   CORONARY ANGIOPLASTY     ERCP N/A 06/15/2024   Procedure: ERCP, WITH INTERVENTION IF INDICATED;  Surgeon: Jinny Carmine, MD;  Location: [facility] ENDOSCOPY;  Service: Endoscopy;  Laterality: N/A;   ESOPHAGOGASTRODUODENOSCOPY (EGD) WITH PROPOFOL  N/A 01/01/2020   Procedure: ESOPHAGOGASTRODUODENOSCOPY (EGD) WITH PROPOFOL ;  Surgeon: [name], [name], MD;  Location: [facility] ENDOSCOPY;  Service: Gastroenterology;  Laterality: N/A;   EYE SURGERY     HOT HEMOSTASIS  06/15/2024   Procedure: EGD, WITH ARGON PLASMA COAGULATION;  Surgeon: Jinny Carmine, MD;  Location: [facility] ENDOSCOPY;  Service:  Endoscopy;;   IR US  GUIDE BX ASP/DRAIN  06/19/2024   IR US  LIVER BIOPSY  06/14/2024   LEFT HEART CATH AND CORONARY ANGIOGRAPHY N/A 04/29/2022   Procedure: LEFT HEART CATH AND CORONARY ANGIOGRAPHY;  Surgeon: [name], MD;  Location: [facility] INVASIVE CV LAB;  Service: Cardiovascular;  Laterality: N/A;   RETINAL DETACHMENT SURGERY      Allergies  Allergen Reactions   Pioglitazone Rash    Current Outpatient Medications  Medication Sig Dispense Refill   acetaminophen  (TYLENOL ) 500 MG tablet Take 1,000 mg by mouth every 8 (eight) hours as needed.     apixaban  (ELIQUIS ) 5 MG TABS tablet Take 2 tablets (10 mg total) by mouth 2 (two) times daily for 5 days, THEN 1 tablet (5 mg total) 2 (two) times daily. 100 tablet 0   Blood Glucose Monitoring Suppl DEVI 1 each by Does not apply route in the morning, at noon, and at bedtime. May substitute to any manufacturer covered by patient's insurance. 1 each 0   Cinnamon 500 MG capsule Take 1,000 mg by mouth daily.     Cyanocobalamin  (VITAMIN B-12) 5000 MCG TBDP Take 5,000 mcg by mouth daily.      empagliflozin  (JARDIANCE ) 10 MG TABS tablet Take 1 tablet by mouth daily with breakfast.     ezetimibe (ZETIA) 10 MG tablet Take 10 mg by mouth daily.     folic acid -pyridoxine -cyancobalamin (FOLTX) 2.5-25-2 MG TABS tablet Take 1 tablet by mouth daily.     hydrocortisone  2.5 % cream APPLY TO THE GROIN DAILY ON MONDAY, WEDNESDAY, AND  FRIDAY. 28 g 1   ketoconazole  (NIZORAL ) 2 % cream APPLY TO THE GROIN AT BEDTIME 60 g 11   levothyroxine  (SYNTHROID ) 112 MCG tablet Take 112 mcg by mouth daily.     lidocaine -prilocaine  (EMLA ) cream Apply to affected area once 30 g 3   liothyronine  (CYTOMEL ) 5 MCG tablet Take 5 mcg by mouth every morning.     methocarbamol  (ROBAXIN ) 500 MG tablet Take 1 tablet (500 mg total) by mouth every 8 (eight) hours as needed for muscle spasms. 30 tablet 0   nitroGLYCERIN  (NITROSTAT ) 0.4 MG SL tablet Place 0.4 mg under the tongue every 5  (five) minutes as needed for chest pain.     PHOSPHATIDYLSERINE PO Take by mouth.     prochlorperazine  (COMPAZINE ) 10 MG tablet Take 1 tablet (10 mg total) by mouth every 6 (six) hours as needed for nausea or vomiting. 60 tablet 1   rosuvastatin  (CRESTOR ) 40 MG tablet Take by mouth.     tadalafil (CIALIS) 5 MG tablet Take 5 mg by mouth daily as needed for erectile dysfunction.     traMADol  (ULTRAM ) 50 MG tablet Take 1-2 tablets (50-100 mg total) by mouth every 6 (six) hours as needed for severe pain (pain score 7-10) or moderate pain (pain score 4-6). 30 tablet 0   No current facility-administered medications for this visit.    Family History Family History  Problem Relation Age of Onset   Leukemia Father       Social History Social History   Tobacco Use   Smoking status: Never   Smokeless tobacco: Never  Vaping Use   Vaping status: Never Used  Substance Use Topics   Alcohol use: Yes    Alcohol/week: 5.0 standard drinks of alcohol    Types: 5 Shots of liquor per week   Drug use: Never      Physical Exam Blood pressure 113/75, pulse 99, temperature 97.8 F (36.6 C), temperature source Oral, height 6' 2 (1.88 m), weight 225 lb 6.4 oz (102.2 kg), SpO2 96%. Last Weight  Most recent update: 06/27/2024  1:44 PM    Weight  102.2 kg (225 lb 6.4 oz)             CONSTITUTIONAL: Well developed, and nourished, appropriately responsive and aware without distress.   EYES: Sclera non-icteric.   EARS, NOSE, MOUTH AND THROAT:  The oropharynx is clear. Oral mucosa is pink and moist.    Hearing is intact to voice.  NECK: Trachea is midline, and there is no jugular venous distension.  LYMPH NODES:  Lymph nodes in the neck are not appreciated. RESPIRATORY:  Lungs are clear, and breath sounds are equal bilaterally.  Normal respiratory effort without pathologic use of accessory muscles. CARDIOVASCULAR: Heart is regular in rate and rhythm.   Well perfused.  GI: The abdomen is notable for  the right upper quadrant Jackson-Pratt drain, removed it.  Applied a gauze dressing and warned him of possible persistent drainage.  Otherwise soft, nontender, and nondistended. There were no palpable masses. MUSCULOSKELETAL:  Symmetrical muscle tone appreciated in all four extremities.    SKIN: Skin turgor is normal. No pathologic skin lesions appreciated.  NEUROLOGIC:  Motor and sensation appear grossly normal.  Cranial nerves are grossly without defect. PSYCH:  Alert and oriented to person, place and time. Affect is appropriate for situation.  Data Reviewed I have personally reviewed what is currently available of the patient's imaging, recent labs and medical records.   Labs:  Latest Ref Rng & Units 06/21/2024    3:32 AM 06/20/2024    3:28 AM 06/19/2024    4:48 AM  CBC  WBC 4.0 - 10.5 K/uL 29.5  33.4  40.2   Hemoglobin 13.0 - 17.0 g/dL 85.4  85.4  85.4   Hematocrit 39.0 - 52.0 % 42.1  45.0  42.8   Platelets 150 - 400 K/uL 196  147  171       Latest Ref Rng & Units 06/21/2024    3:32 AM 06/20/2024    3:28 AM 06/19/2024    4:48 AM  CMP  Glucose 70 - 99 mg/dL 879  854  864   BUN 8 - 23 mg/dL 24  26  26    Creatinine 0.61 - 1.24 mg/dL 8.78  9.19  8.90   Sodium 135 - 145 mmol/L 136  136  138   Potassium 3.5 - 5.1 mmol/L 5.0  4.6  4.1   Chloride 98 - 111 mmol/L 105  106  104   CO2 22 - 32 mmol/L 25  21  25    Calcium  8.9 - 10.3 mg/dL 8.5  8.4  8.4   Total Protein 6.5 - 8.1 g/dL 4.9  4.7  4.7   Total Bilirubin 0.0 - 1.2 mg/dL 1.4  1.1  1.3    1.3   Alkaline Phos 38 - 126 U/L 89  66  59   AST 15 - 41 U/L 62  47  42   ALT 0 - 44 U/L 59  50  48      Imaging: Radiological images personally reviewed:   Within last 24 hrs: No results found.  Assessment    Peripheral venous insufficiency, need for central venous access for anticipated Avastin therapy. Patient Active Problem List   Diagnosis Date Noted   Hepatocellular carcinoma (HCC) 06/26/2024   Bile duct leak 06/15/2024    Bile leak 06/14/2024   Portal vein thrombosis 06/14/2024   Calculus of gallbladder with acute cholecystitis without obstruction 06/10/2024   Cirrhosis of liver with ascites (HCC) 06/10/2024   Liver masses 06/10/2024   Abdominal pain 06/08/2024   Closed nondisplaced fracture of fifth cervical vertebra with routine healing 10/25/2023   Pain in joint of right shoulder 10/25/2023   OSA on CPAP 08/30/2023   CPAP use counseling 08/30/2023   S/P angioplasty with stent 05/01/2022   GERD (gastroesophageal reflux disease) 04/28/2021   Sleep apnea 04/28/2021   Hyperlipidemia 04/28/2021   Hyperlipidemia    Hypertension    Diabetes mellitus without complication (HCC)    CAD (coronary artery disease)    Thrombocytopenia (HCC)    Acute metabolic encephalopathy    Hypoglycemia    Type 2 diabetes mellitus without complication, without long-term current use of insulin  (HCC) 02/11/2017   Combined arterial insufficiency and corporo-venous occlusive erectile dysfunction 02/10/2017   Coronary artery disease 06/06/2014   Hypertension 06/06/2014   Obesity 05/30/2012   Myocardial infarction (HCC) 03/02/2000    Plan    Port-A-Cath placement  Risks of central venous line/Port-A-Cath placement discussed in detail.  These risks include but are not limited to anesthesia, bleeding, infection, thrombosis, venous/vascular injury, possible pneumothorax, cardiac arrhythmia etc.  Believe the patient understands that these risks are not all-inclusive and that catheter placement may require tunneling to other potential sites.  Questions answered to what I believe is the patient's satisfaction.  No guarantees were ever expressed or implied.     These notes generated with voice recognition software. I apologize for  typographical errors.  [name] M.D., FACS 06/27/2024, 2:41 PM

## 2024-06-27 NOTE — Telephone Encounter (Signed)
 Patient has been advised of Pre-Admission date/time, and Surgery date at [facility].  Surgery Date: 06/28/24 Preadmission Testing Date: patient will arrive at the hospital at 10:00 am and check in at the [facility] for pre admit testing. Patient is aware no food or drink in am and may take Thyroid meds with sip of water. He is to bring his other medications with him. He is aware to hold his Eliquis .

## 2024-06-27 NOTE — H&P (View-Only) (Signed)
 Patient ID: Nicholas Abbott, male   DOB: 1951-09-30, 73 y.o.   MRN: [id number]  Chief Complaint: Follow-up after hospitalization for acute cholecystitis with hepatocellular carcinoma with cirrhosis and persistent ascitic drainage.  History of Present Illness Nicholas Abbott is a 73 y.o. male with presentation follow-up still having his right upper quadrant Jackson-Pratt drain which is draining alternating serous or cloudy serous fluid.  Still with relatively large outputs.  He denies any history of fever or chills.  Has seen Dr. Phenergan and is being scheduled for initiation of Avastin.  Last Eliquis  dose was this morning.  Past Medical History Past Medical History:  Diagnosis Date   Basal cell carcinoma    L ear, txted in past   Diabetes mellitus without complication (HCC)    GERD (gastroesophageal reflux disease)    Hyperlipidemia    Hypertension    Left shoulder pain    Myocardial infarction [facility])       Past Surgical History:  Procedure Laterality Date   BILIARY STENT PLACEMENT  06/15/2024   Procedure: INSERTION, STENT, BILE DUCT;  Surgeon: Jinny Carmine, MD;  Location: [facility] ENDOSCOPY;  Service: Endoscopy;;   CATARACT EXTRACTION     COLONOSCOPY WITH PROPOFOL  N/A 01/01/2020   Procedure: COLONOSCOPY WITH PROPOFOL ;  Surgeon: [name], [name], MD;  Location: [facility] ENDOSCOPY;  Service: Gastroenterology;  Laterality: N/A;   CORONARY ANGIOPLASTY     ERCP N/A 06/15/2024   Procedure: ERCP, WITH INTERVENTION IF INDICATED;  Surgeon: Jinny Carmine, MD;  Location: [facility]OS[facility];  Service: Endoscopy;  Laterality: N/A;   ESOPHAGOGASTRODUODENOSCOPY (EGD) WITH PROPOFOL  N/A 01/01/2020   Procedure: ESOPHAGOGASTRODUODENOSCOPY (EGD) WITH PROPOFOL ;  Surgeon: [name], [name], MD;  Location: [facility] ENDOSCOPY;  Service: Gastroenterology;  Laterality: N/A;   EYE SURGERY     HOT HEMOSTASIS  06/15/2024   Procedure: EGD, WITH ARGON PLASMA COAGULATION;  Surgeon: Jinny Carmine, MD;  Location: [facility] ENDOSCOPY;  Service:  Endoscopy;;   IR US  GUIDE BX ASP/DRAIN  06/19/2024   IR US  LIVER BIOPSY  06/14/2024   LEFT HEART CATH AND CORONARY ANGIOGRAPHY N/A 04/29/2022   Procedure: LEFT HEART CATH AND CORONARY ANGIOGRAPHY;  Surgeon: [name], MD;  Location: [facility] INVASIVE CV LAB;  Service: Cardiovascular;  Laterality: N/A;   RETINAL DETACHMENT SURGERY      Allergies  Allergen Reactions   Pioglitazone Rash    Current Outpatient Medications  Medication Sig Dispense Refill   acetaminophen  (TYLENOL ) 500 MG tablet Take 1,000 mg by mouth every 8 (eight) hours as needed.     apixaban  (ELIQUIS ) 5 MG TABS tablet Take 2 tablets (10 mg total) by mouth 2 (two) times daily for 5 days, THEN 1 tablet (5 mg total) 2 (two) times daily. 100 tablet 0   Blood Glucose Monitoring Suppl DEVI 1 each by Does not apply route in the morning, at noon, and at bedtime. May substitute to any manufacturer covered by patient's insurance. 1 each 0   Cinnamon 500 MG capsule Take 1,000 mg by mouth daily.     Cyanocobalamin  (VITAMIN B-12) 5000 MCG TBDP Take 5,000 mcg by mouth daily.      empagliflozin  (JARDIANCE ) 10 MG TABS tablet Take 1 tablet by mouth daily with breakfast.     ezetimibe (ZETIA) 10 MG tablet Take 10 mg by mouth daily.     folic acid -pyridoxine -cyancobalamin (FOLTX) 2.5-25-2 MG TABS tablet Take 1 tablet by mouth daily.     hydrocortisone  2.5 % cream APPLY TO THE GROIN DAILY ON MONDAY, WEDNESDAY, AND  FRIDAY. 28 g 1   ketoconazole  (NIZORAL ) 2 % cream APPLY TO THE GROIN AT BEDTIME 60 g 11   levothyroxine  (SYNTHROID ) 112 MCG tablet Take 112 mcg by mouth daily.     lidocaine -prilocaine  (EMLA ) cream Apply to affected area once 30 g 3   liothyronine  (CYTOMEL ) 5 MCG tablet Take 5 mcg by mouth every morning.     methocarbamol  (ROBAXIN ) 500 MG tablet Take 1 tablet (500 mg total) by mouth every 8 (eight) hours as needed for muscle spasms. 30 tablet 0   nitroGLYCERIN  (NITROSTAT ) 0.4 MG SL tablet Place 0.4 mg under the tongue every 5  (five) minutes as needed for chest pain.     PHOSPHATIDYLSERINE PO Take by mouth.     prochlorperazine  (COMPAZINE ) 10 MG tablet Take 1 tablet (10 mg total) by mouth every 6 (six) hours as needed for nausea or vomiting. 60 tablet 1   rosuvastatin  (CRESTOR ) 40 MG tablet Take by mouth.     tadalafil (CIALIS) 5 MG tablet Take 5 mg by mouth daily as needed for erectile dysfunction.     traMADol  (ULTRAM ) 50 MG tablet Take 1-2 tablets (50-100 mg total) by mouth every 6 (six) hours as needed for severe pain (pain score 7-10) or moderate pain (pain score 4-6). 30 tablet 0   No current facility-administered medications for this visit.    Family History Family History  Problem Relation Age of Onset   Leukemia Father       Social History Social History   Tobacco Use   Smoking status: Never   Smokeless tobacco: Never  Vaping Use   Vaping status: Never Used  Substance Use Topics   Alcohol use: Yes    Alcohol/week: 5.0 standard drinks of alcohol    Types: 5 Shots of liquor per week   Drug use: Never      Physical Exam Blood pressure 113/75, pulse 99, temperature 97.8 F (36.6 C), temperature source Oral, height 6' 2 (1.88 m), weight 225 lb 6.4 oz (102.2 kg), SpO2 96%. Last Weight  Most recent update: 06/27/2024  1:44 PM    Weight  102.2 kg (225 lb 6.4 oz)             CONSTITUTIONAL: Well developed, and nourished, appropriately responsive and aware without distress.   EYES: Sclera non-icteric.   EARS, NOSE, MOUTH AND THROAT:  The oropharynx is clear. Oral mucosa is pink and moist.    Hearing is intact to voice.  NECK: Trachea is midline, and there is no jugular venous distension.  LYMPH NODES:  Lymph nodes in the neck are not appreciated. RESPIRATORY:  Lungs are clear, and breath sounds are equal bilaterally.  Normal respiratory effort without pathologic use of accessory muscles. CARDIOVASCULAR: Heart is regular in rate and rhythm.   Well perfused.  GI: The abdomen is notable for  the right upper quadrant Jackson-Pratt drain, removed it.  Applied a gauze dressing and warned him of possible persistent drainage.  Otherwise soft, nontender, and nondistended. There were no palpable masses. MUSCULOSKELETAL:  Symmetrical muscle tone appreciated in all four extremities.    SKIN: Skin turgor is normal. No pathologic skin lesions appreciated.  NEUROLOGIC:  Motor and sensation appear grossly normal.  Cranial nerves are grossly without defect. PSYCH:  Alert and oriented to person, place and time. Affect is appropriate for situation.  Data Reviewed I have personally reviewed what is currently available of the patient's imaging, recent labs and medical records.   Labs:  Latest Ref Rng & Units 06/21/2024    3:32 AM 06/20/2024    3:28 AM 06/19/2024    4:48 AM  CBC  WBC 4.0 - 10.5 K/uL 29.5  33.4  40.2   Hemoglobin 13.0 - 17.0 g/dL 85.4  85.4  85.4   Hematocrit 39.0 - 52.0 % 42.1  45.0  42.8   Platelets 150 - 400 K/uL 196  147  171       Latest Ref Rng & Units 06/21/2024    3:32 AM 06/20/2024    3:28 AM 06/19/2024    4:48 AM  CMP  Glucose 70 - 99 mg/dL 879  854  864   BUN 8 - 23 mg/dL 24  26  26    Creatinine 0.61 - 1.24 mg/dL 8.78  9.19  8.90   Sodium 135 - 145 mmol/L 136  136  138   Potassium 3.5 - 5.1 mmol/L 5.0  4.6  4.1   Chloride 98 - 111 mmol/L 105  106  104   CO2 22 - 32 mmol/L 25  21  25    Calcium  8.9 - 10.3 mg/dL 8.5  8.4  8.4   Total Protein 6.5 - 8.1 g/dL 4.9  4.7  4.7   Total Bilirubin 0.0 - 1.2 mg/dL 1.4  1.1  1.3    1.3   Alkaline Phos 38 - 126 U/L 89  66  59   AST 15 - 41 U/L 62  47  42   ALT 0 - 44 U/L 59  50  48      Imaging: Radiological images personally reviewed:   Within last 24 hrs: No results found.  Assessment    Peripheral venous insufficiency, need for central venous access for anticipated Avastin therapy. Patient Active Problem List   Diagnosis Date Noted   Hepatocellular carcinoma (HCC) 06/26/2024   Bile duct leak 06/15/2024    Bile leak 06/14/2024   Portal vein thrombosis 06/14/2024   Calculus of gallbladder with acute cholecystitis without obstruction 06/10/2024   Cirrhosis of liver with ascites (HCC) 06/10/2024   Liver masses 06/10/2024   Abdominal pain 06/08/2024   Closed nondisplaced fracture of fifth cervical vertebra with routine healing 10/25/2023   Pain in joint of right shoulder 10/25/2023   OSA on CPAP 08/30/2023   CPAP use counseling 08/30/2023   S/P angioplasty with stent 05/01/2022   GERD (gastroesophageal reflux disease) 04/28/2021   Sleep apnea 04/28/2021   Hyperlipidemia 04/28/2021   Hyperlipidemia    Hypertension    Diabetes mellitus without complication (HCC)    CAD (coronary artery disease)    Thrombocytopenia (HCC)    Acute metabolic encephalopathy    Hypoglycemia    Type 2 diabetes mellitus without complication, without long-term current use of insulin  (HCC) 02/11/2017   Combined arterial insufficiency and corporo-venous occlusive erectile dysfunction 02/10/2017   Coronary artery disease 06/06/2014   Hypertension 06/06/2014   Obesity 05/30/2012   Myocardial infarction (HCC) 03/02/2000    Plan    Port-A-Cath placement  Risks of central venous line/Port-A-Cath placement discussed in detail.  These risks include but are not limited to anesthesia, bleeding, infection, thrombosis, venous/vascular injury, possible pneumothorax, cardiac arrhythmia etc.  Believe the patient understands that these risks are not all-inclusive and that catheter placement may require tunneling to other potential sites.  Questions answered to what I believe is the patient's satisfaction.  No guarantees were ever expressed or implied.     These notes generated with voice recognition software. I apologize for  typographical errors.  [name] M.D., FACS 06/27/2024, 2:41 PM

## 2024-06-27 NOTE — Patient Instructions (Signed)
 We have seen you today and have spoken about your port placement. This will be scheduled at [facility] with Dr. Lane.  If you are on any injectable weight loss medication, you will need to stop taking your GLP-1 injectable (weight loss) medications 8 days before your surgery to avoid any complications with anesthesia.   Please see the [facility]) Sheet provided for further details. Our surgery scheduler will call you to look at surgery dates and go over surgery information.   Please call our office with any questions or concerns that you have.   Port-a-Cath [facility]) A central line is a soft, flexible tube (catheter) that can be used to collect blood for testing or to give medicine or nutrition through a vein. The tip of the central line ends in a large vein just above the heart called the vena cava. A central line may be placed because: You need to get medicines or fluids through an IV tube for a long period of time. You need nutrition but cannot eat or absorb nutrients. The veins in your hands or arms are hard to access. You need to have blood taken often for blood tests. You need a blood transfusion You need chemotherapy or dialysis.  There are many types of central lines: Peripherally inserted central catheter (PICC) line. This type is used for intermediate access to long-term access of one week or more. It can be used to draw blood and give fluids or medicines. A PICC looks like an IV tube, but it goes up the arm to the heart. It is usually inserted in the upper arm and taped in place on the arm. Tunneled central line. This type is used for long-term therapy and dialysis. It is placed in a large vein in the neck, chest, or groin. A tunneled central line is inserted through a small incision made over the vein and is advanced into the heart. It is tunneled beneath the skin and brought out through a second incision. Non-tunneled central line. This type is used for short-term access,  usually of a maximum of 7 days. It is often used in the emergency department. A non-tunneled central line is inserted in the neck, chest, or groin. Implanted port. This type is used for long-term therapy. It can stay in place longer than other types of central lines. An implanted port is normally inserted in the upper chest but can also be placed in the upper arm or in the abdomen. It is inserted and removed with surgery, and it is accessed using a special needle.  The type of central line that you receive depends on how long you will need it, your medical condition, and the condition of your veins. What are the risks? Using any type of central line has risks that you should be aware of, including: Infection. A blood clot that blocks the central line or forms in the vein and travels to the heart. Bleeding from the place where the central line was put in. Developing a hole or crack within the central line. If this happens, the central line will need to be replaced. Developing an abnormal heart rhythm (arrhythmia). This is rare. Central line failure.  Follow these instructions at home: Flushing and cleaning the central line Follow instructions from the health care provider about flushing and cleaning the central line. Wear a mask when flushing or cleaning the central line. Before you flush or clean the central line: Wash your hands with soap and water. Clean the central line  hub with rubbing alcohol. Insertion site care Keep the insertion site of your central line clean and dry at all times. Check your incision or central line site every day for signs of infection. Check for: More redness, swelling, or pain. More fluid or blood. Warmth. Pus or a bad smell. General instructions Follow instructions from your health care provider for the type of device that you have. If the central line accidentally gets pulled on, make sure: The bandage (dressing) is okay. There is no bleeding. The line  has not been pulled out. Return to your normal activities as told by your health care provider. Ask your health care provider what activities are safe for you. You may be restricted from lifting or making repetitive arm movements on the side with the catheter. Do not swim or bathe unless your health care provider approves. Keep your dressing dry. Your health care provider can instruct you about how to keep your specific type of dressing from getting wet. Keep all follow-up visits as told by your health care provider. This is important. Contact a health care provider if: You have more redness, swelling, or pain around your incision. You have more fluid or blood coming from your incision. Your incision feels warm to the touch. You have pus or a bad smell coming from your incision. Get help right away if: You have: Chills. A fever. Shortness of breath. Trouble breathing. Chest pain. Swelling in your neck, face, chest, or arm on the side of your central line. You are coughing. You feel your heart beating rapidly or skipping beats. You feel dizzy or you faint. Your incision or central line site has red streaks spreading away from the area. Your incision or central line site is bleeding and does not stop. Your central line is difficult to flush or will not flush. You do not get a blood return from the central line. Your central line gets loose or comes out. Your central line gets damaged. Your catheter leaks when flushed or when fluids are infused into it. This information is not intended to replace advice given to you by your health care provider. Make sure you discuss any questions you have with your health care provider. Document Released: 12/10/2005 Document Revised: 06/17/2016 Document Reviewed: 05/27/2016 Elsevier Interactive Patient Education  2017 [employer].

## 2024-06-27 NOTE — Progress Notes (Signed)
 Pharmacist Chemotherapy Monitoring - Initial Assessment    Anticipated start date: 07/06/24   The following has been reviewed per standard work regarding the patient's treatment regimen: The patient's diagnosis, treatment plan and drug doses, and organ/hematologic function Lab orders and baseline tests specific to treatment regimen  The treatment plan start date, drug sequencing, and pre-medications Prior authorization status  Patient's documented medication list, including drug-drug interaction screen and prescriptions for anti-emetics and supportive care specific to the treatment regimen The drug concentrations, fluid compatibility, administration routes, and timing of the medications to be used The patient's access for treatment and lifetime cumulative dose history, if applicable  The patient's medication allergies and previous infusion related reactions, if applicable      Follow up needed:  Patient scheduled for port placement. Dr jacobo alerted about starting avastin 7-10 days after port placement   Redell JINNY Gaskins, [facility], 06/27/2024  12:35 PM

## 2024-06-28 ENCOUNTER — Other Ambulatory Visit: Payer: Self-pay

## 2024-06-28 ENCOUNTER — Ambulatory Visit

## 2024-06-28 ENCOUNTER — Ambulatory Visit
Admission: RE | Admit: 2024-06-28 | Discharge: 2024-06-28 | Disposition: A | Discharge status: Home / Self Care | Attending: Surgery | Admitting: Surgery

## 2024-06-28 ENCOUNTER — Ambulatory Visit: Admitting: Anesthesiology

## 2024-06-28 ENCOUNTER — Encounter: Payer: Self-pay | Admitting: Surgery

## 2024-06-28 ENCOUNTER — Encounter
Admission: RE | Disposition: A | Discharge status: Home / Self Care | Payer: Self-pay | Source: Home / Self Care | Attending: Surgery

## 2024-06-28 ENCOUNTER — Other Ambulatory Visit
Admission: RE | Admit: 2024-06-28 | Discharge: 2024-06-28 | Disposition: A | Discharge status: Home / Self Care | Source: Ambulatory Visit | Attending: Internal Medicine | Admitting: Internal Medicine

## 2024-06-28 DIAGNOSIS — E785 Hyperlipidemia, unspecified: Secondary | ICD-10-CM | POA: Insufficient documentation

## 2024-06-28 DIAGNOSIS — E119 Type 2 diabetes mellitus without complications: Secondary | ICD-10-CM | POA: Diagnosis not present

## 2024-06-28 DIAGNOSIS — Z794 Long term (current) use of insulin: Secondary | ICD-10-CM | POA: Insufficient documentation

## 2024-06-28 DIAGNOSIS — Z79890 Hormone replacement therapy (postmenopausal): Secondary | ICD-10-CM | POA: Insufficient documentation

## 2024-06-28 DIAGNOSIS — I2510 Atherosclerotic heart disease of native coronary artery without angina pectoris: Secondary | ICD-10-CM | POA: Diagnosis not present

## 2024-06-28 DIAGNOSIS — I252 Old myocardial infarction: Secondary | ICD-10-CM | POA: Insufficient documentation

## 2024-06-28 DIAGNOSIS — Z6828 Body mass index (BMI) 28.0-28.9, adult: Secondary | ICD-10-CM | POA: Diagnosis not present

## 2024-06-28 DIAGNOSIS — Z7984 Long term (current) use of oral hypoglycemic drugs: Secondary | ICD-10-CM | POA: Insufficient documentation

## 2024-06-28 DIAGNOSIS — I10 Essential (primary) hypertension: Secondary | ICD-10-CM | POA: Insufficient documentation

## 2024-06-28 DIAGNOSIS — K219 Gastro-esophageal reflux disease without esophagitis: Secondary | ICD-10-CM | POA: Insufficient documentation

## 2024-06-28 DIAGNOSIS — C220 Liver cell carcinoma: Secondary | ICD-10-CM | POA: Diagnosis present

## 2024-06-28 DIAGNOSIS — Z7901 Long term (current) use of anticoagulants: Secondary | ICD-10-CM | POA: Insufficient documentation

## 2024-06-28 DIAGNOSIS — I872 Venous insufficiency (chronic) (peripheral): Secondary | ICD-10-CM | POA: Insufficient documentation

## 2024-06-28 DIAGNOSIS — E669 Obesity, unspecified: Secondary | ICD-10-CM | POA: Diagnosis not present

## 2024-06-28 DIAGNOSIS — Z955 Presence of coronary angioplasty implant and graft: Secondary | ICD-10-CM | POA: Insufficient documentation

## 2024-06-28 DIAGNOSIS — G4733 Obstructive sleep apnea (adult) (pediatric): Secondary | ICD-10-CM | POA: Diagnosis not present

## 2024-06-28 DIAGNOSIS — Z419 Encounter for procedure for purposes other than remedying health state, unspecified: Secondary | ICD-10-CM

## 2024-06-28 HISTORY — PX: PORTACATH PLACEMENT: SHX2246

## 2024-06-28 LAB — CBC WITH DIFFERENTIAL/PLATELET
Abs Immature Granulocytes: 0.15 K/uL — ABNORMAL HIGH (ref 0.00–0.07)
Basophils Absolute: 0.1 K/uL (ref 0.0–0.1)
Basophils Relative: 0 %
Eosinophils Absolute: 0.3 K/uL (ref 0.0–0.5)
Eosinophils Relative: 1 %
HCT: 46 % (ref 39.0–52.0)
Hemoglobin: 15.6 g/dL (ref 13.0–17.0)
Immature Granulocytes: 1 %
Lymphocytes Relative: 62 %
Lymphs Abs: 13 K/uL — ABNORMAL HIGH (ref 0.7–4.0)
MCH: 34.4 pg — ABNORMAL HIGH (ref 26.0–34.0)
MCHC: 33.9 g/dL (ref 30.0–36.0)
MCV: 101.5 fL — ABNORMAL HIGH (ref 80.0–100.0)
Monocytes Absolute: 1.3 K/uL — ABNORMAL HIGH (ref 0.1–1.0)
Monocytes Relative: 6 %
Neutro Abs: 6.4 K/uL (ref 1.7–7.7)
Neutrophils Relative %: 30 %
Platelets: 220 K/uL (ref 150–400)
RBC: 4.53 MIL/uL (ref 4.22–5.81)
RDW: 16.1 % — ABNORMAL HIGH (ref 11.5–15.5)
Smear Review: NORMAL
WBC: 21.1 K/uL — ABNORMAL HIGH (ref 4.0–10.5)
nRBC: 0.1 % (ref 0.0–0.2)

## 2024-06-28 LAB — GLUCOSE, CAPILLARY
Glucose-Capillary: 161 mg/dL — ABNORMAL HIGH (ref 70–99)
Glucose-Capillary: 142 mg/dL — ABNORMAL HIGH (ref 70–99)

## 2024-06-28 SURGERY — INSERTION, TUNNELED CENTRAL VENOUS DEVICE, WITH PORT
Anesthesia: General | Site: Chest | Laterality: Right

## 2024-06-28 MED ORDER — SODIUM CHLORIDE (PF) 0.9 % IJ SOLN
INTRAMUSCULAR | Status: AC
  Filled 2024-06-28: qty 10

## 2024-06-28 MED ORDER — ACETAMINOPHEN 500 MG PO TABS
ORAL_TABLET | ORAL | Status: AC
  Filled 2024-06-28: qty 2

## 2024-06-28 MED ORDER — GABAPENTIN 300 MG PO CAPS
ORAL_CAPSULE | ORAL | Status: AC
  Filled 2024-06-28: qty 1

## 2024-06-28 MED ORDER — HEPARIN SODIUM (PORCINE) 5000 UNIT/ML IJ SOLN
INTRAMUSCULAR | Status: AC
  Filled 2024-06-28: qty 1

## 2024-06-28 MED ORDER — CHLORHEXIDINE GLUCONATE CLOTH 2 % EX PADS: 6.0000 | MEDICATED_PAD | Freq: Once | CUTANEOUS | Status: DC

## 2024-06-28 MED ORDER — LIDOCAINE HCL (PF) 2 % IJ SOLN
INTRAMUSCULAR | Status: AC
  Filled 2024-06-28: qty 5

## 2024-06-28 MED ORDER — ORAL CARE MOUTH RINSE: 15.0000 mL | Freq: Once | OROMUCOSAL | Status: AC

## 2024-06-28 MED ORDER — CHLORHEXIDINE GLUCONATE 0.12 % MT SOLN
OROMUCOSAL | Status: AC
  Filled 2024-06-28: qty 15

## 2024-06-28 MED ORDER — SODIUM CHLORIDE 0.9 % IV SOLN
INTRAVENOUS | Status: DC | PRN
  Administered 2024-06-28: 15 mL via INTRAMUSCULAR

## 2024-06-28 MED ORDER — CELECOXIB 200 MG PO CAPS
ORAL_CAPSULE | ORAL | Status: AC
  Filled 2024-06-28: qty 1

## 2024-06-28 MED ORDER — OXYCODONE HCL 5 MG/5ML PO SOLN: 5.0000 mg | Freq: Once | ORAL | Status: DC | PRN

## 2024-06-28 MED ORDER — ACETAMINOPHEN 500 MG PO TABS
1000.0000 mg | ORAL_TABLET | ORAL | Status: AC
  Administered 2024-06-28: 1000 mg via ORAL

## 2024-06-28 MED ORDER — OXYCODONE HCL 5 MG PO TABS: 5.0000 mg | ORAL_TABLET | Freq: Once | ORAL | Status: DC | PRN

## 2024-06-28 MED ORDER — CEFAZOLIN SODIUM-DEXTROSE 2-4 GM/100ML-% IV SOLN
INTRAVENOUS | Status: AC
  Filled 2024-06-28: qty 100

## 2024-06-28 MED ORDER — FENTANYL CITRATE (PF) 100 MCG/2ML IJ SOLN: 25.0000 ug | INTRAMUSCULAR | Status: DC | PRN

## 2024-06-28 MED ORDER — SODIUM CHLORIDE (PF) 0.9 % IJ SOLN
INTRAMUSCULAR | Status: AC
  Filled 2024-06-28: qty 50

## 2024-06-28 MED ORDER — CEFAZOLIN SODIUM-DEXTROSE 2-4 GM/100ML-% IV SOLN: 2.0000 g | INTRAVENOUS | Status: DC

## 2024-06-28 MED ORDER — BUPIVACAINE LIPOSOME 1.3 % IJ SUSP: 20.0000 mL | Freq: Once | INTRAMUSCULAR | Status: DC

## 2024-06-28 MED ORDER — HEPARIN SODIUM (PORCINE) 5000 UNIT/ML IJ SOLN
INTRAMUSCULAR | Status: DC | PRN
  Administered 2024-06-28: 5000 [IU] via INTRAVENOUS

## 2024-06-28 MED ORDER — GLYCOPYRROLATE 0.2 MG/ML IJ SOLN
INTRAMUSCULAR | Status: AC
  Filled 2024-06-28: qty 1

## 2024-06-28 MED ORDER — 0.9 % SODIUM CHLORIDE (POUR BTL) OPTIME
TOPICAL | Status: DC | PRN
  Administered 2024-06-28: 500 mL

## 2024-06-28 MED ORDER — BUPIVACAINE-EPINEPHRINE (PF) 0.25% -1:200000 IJ SOLN
INTRAMUSCULAR | Status: AC
  Filled 2024-06-28: qty 30

## 2024-06-28 MED ORDER — PROPOFOL 1000 MG/100ML IV EMUL
INTRAVENOUS | Status: AC
  Filled 2024-06-28: qty 100

## 2024-06-28 MED ORDER — SODIUM CHLORIDE 0.9 % IV SOLN: INTRAVENOUS | Status: DC

## 2024-06-28 MED ORDER — HEPARIN SODIUM (PORCINE) 5000 UNIT/ML IJ SOLN
INTRAMUSCULAR | Status: AC
  Filled 2024-06-28: qty 2

## 2024-06-28 MED ORDER — LACTATED RINGERS IV SOLN: INTRAVENOUS | Status: DC

## 2024-06-28 MED ORDER — BUPIVACAINE-EPINEPHRINE (PF) 0.25% -1:200000 IJ SOLN
INTRAMUSCULAR | Status: DC | PRN
  Administered 2024-06-28: 10 mL

## 2024-06-28 MED ORDER — GABAPENTIN 300 MG PO CAPS
300.0000 mg | ORAL_CAPSULE | ORAL | Status: AC
  Administered 2024-06-28: 300 mg via ORAL

## 2024-06-28 MED ORDER — CHLORHEXIDINE GLUCONATE 0.12 % MT SOLN
15.0000 mL | Freq: Once | OROMUCOSAL | Status: AC
  Administered 2024-06-28: 15 mL via OROMUCOSAL

## 2024-06-28 MED ORDER — ONDANSETRON HCL 4 MG/2ML IJ SOLN: 4.0000 mg | Freq: Once | INTRAMUSCULAR | Status: DC | PRN

## 2024-06-28 MED ORDER — CELECOXIB 200 MG PO CAPS
200.0000 mg | ORAL_CAPSULE | ORAL | Status: AC
  Administered 2024-06-28: 200 mg via ORAL

## 2024-06-28 MED ORDER — PROPOFOL 500 MG/50ML IV EMUL
INTRAVENOUS | Status: DC | PRN
  Administered 2024-06-28: 50 mg via INTRAVENOUS
  Administered 2024-06-28: 125 ug/kg/min via INTRAVENOUS
  Administered 2024-06-28: 100 mg via INTRAVENOUS

## 2024-06-28 MED ORDER — LIDOCAINE HCL (CARDIAC) PF 100 MG/5ML IV SOSY
PREFILLED_SYRINGE | INTRAVENOUS | Status: DC | PRN
  Administered 2024-06-28: 100 mg via INTRAVENOUS

## 2024-06-28 SURGICAL SUPPLY — 26 items
BAG DECANTER FOR FLEXI CONT (MISCELLANEOUS) ×1 IMPLANT
BLADE SURG 15 STRL LF DISP TIS (BLADE) ×1 IMPLANT
CHLORAPREP W/TINT 26 (MISCELLANEOUS) IMPLANT
CLAMP SUTURE YELLOW 5 PAIRS (MISCELLANEOUS) ×1 IMPLANT
COVER LIGHT HANDLE STERIS (MISCELLANEOUS) ×2 IMPLANT
DERMABOND ADVANCED .7 DNX12 (GAUZE/BANDAGES/DRESSINGS) ×1 IMPLANT
DRAPE C-ARM XRAY 36X54 (DRAPES) ×1 IMPLANT
ELECT CAUTERY BLADE 6.4 (BLADE) ×1 IMPLANT
ELECTRODE REM PT RTRN 9FT ADLT (ELECTROSURGICAL) ×1 IMPLANT
GLOVE ORTHO TXT STRL SZ7.5 (GLOVE) ×2 IMPLANT
GOWN STRL REUS W/ TWL LRG LVL3 (GOWN DISPOSABLE) ×1 IMPLANT
GOWN STRL REUS W/ TWL XL LVL3 (GOWN DISPOSABLE) ×1 IMPLANT
KIT PORT INFUSION SMART 8FR (Port) ×1 IMPLANT
KIT TURNOVER KIT A (KITS) ×1 IMPLANT
MANIFOLD NEPTUNE II (INSTRUMENTS) ×1 IMPLANT
NDL HYPO 22X1.5 SAFETY MO (MISCELLANEOUS) ×1 IMPLANT
NDL SAFETY ECLIPSE 18X1.5 (NEEDLE) IMPLANT
NEEDLE HYPO 22X1.5 SAFETY MO (MISCELLANEOUS) ×1 IMPLANT
NS IRRIG 500ML POUR BTL (IV SOLUTION) IMPLANT
PACK PORT-A-CATH (MISCELLANEOUS) ×1 IMPLANT
SUT MNCRL AB 4-0 PS2 18 (SUTURE) ×1 IMPLANT
SUT VIC AB 3-0 SH 27X BRD (SUTURE) ×1 IMPLANT
SYR 10ML LL (SYRINGE) ×1 IMPLANT
SYR 3ML LL SCALE MARK (SYRINGE) ×1 IMPLANT
SYR 5ML LL (SYRINGE) ×1 IMPLANT
TRAP FLUID SMOKE EVACUATOR (MISCELLANEOUS) ×1 IMPLANT

## 2024-06-28 NOTE — Transfer of Care (Signed)
 Immediate Anesthesia Transfer of Care Note  Patient: Nicholas Abbott  Procedure(s) Performed: INSERTION, TUNNELED CENTRAL VENOUS DEVICE, WITH PORT (Right: Chest)  Patient Location: PACU  Anesthesia Type:MAC  Level of Consciousness: sedated  Airway & Oxygen Therapy: Patient Spontanous Breathing  Post-op Assessment: Report given to RN and Post -op Vital signs reviewed and stable  Post vital signs: Reviewed and stable  Last Vitals:  Vitals Value Taken Time  BP 114/81 06/28/24 13:00  Temp    Pulse 71 06/28/24 13:04  Resp 13 06/28/24 13:04  SpO2 97 % 06/28/24 13:04  Vitals shown include unfiled device data.  Last Pain:  Vitals:   06/28/24 1031  TempSrc: Oral         Complications: There were no known notable events for this encounter.

## 2024-06-28 NOTE — Interval H&P Note (Signed)
 History and Physical Interval Note:  06/28/2024 11:30 AM  Nicholas Abbott  has presented today for surgery, with the diagnosis of Hepatocellular Carcinoma.  The various methods of treatment have been discussed with the patient and family. After consideration of risks, benefits and other options for treatment, the patient has consented to  Procedure(s): INSERTION, TUNNELED CENTRAL VENOUS DEVICE, WITH PORT (N/A) as a surgical intervention.  The patient's history has been reviewed, patient examined, no change in status, stable for surgery.  I have reviewed the patient's chart and labs.  Questions were answered to the patient's satisfaction.     [name]

## 2024-06-28 NOTE — Op Note (Signed)
 SURGICAL PROCEDURE REPORT  DATE OF PROCEDURE: 06/28/2024   ATTENDING SURGEON:  [name], M.D., FACS   ANESTHESIA: MAC/local   PRE-OPERATIVE DIAGNOSIS: Advanced Hepatocellular cancer requiring durable central venous access for chemotherapy   POST-OPERATIVE DIAGNOSIS: Same.  PROCEDURE: (cpt: 36561, 77001)  Insertion of right subclavian vein central venous catheter with subcutaneous port under fluoroscopic guidance   INTRAOPERATIVE FINDINGS: Fluoroscopic confirmation of guidewire in superior vena cava, tip at fluoroscopy appears to be at caval atrial junction, excellent return and easy flush, well-secured tunneled central venous catheter with subcutaneous port at completion of the procedure   FLUOROSCOPY: as recorded.   ESTIMATED BLOOD LOSS: Minimal (<20 mL)   SPECIMENS: None   IMPLANTS: 25F tunneled central venous catheter with subcutaneous port  DRAINS: None   COMPLICATIONS: None apparent   CONDITION AT COMPLETION: Hemodynamically stable, awake   DISPOSITION: PACU   INDICATION(S) FOR PROCEDURE:  Patient is a 73 y.o. male who presented with advanced Hepatocellular cancer requiring durable central venous access for chemotherapy. All risks, benefits, and alternatives to above elective procedures were discussed with the patient, who elected to proceed, and informed consent was accordingly obtained at that time.  DETAILS OF PROCEDURE:  Patient was brought to the operative suite and appropriately identified. Right subclavian access site and planned port placement site were prepped and draped in the usual sterile fashion. Following a brief timeout, in Trendelenburg position, percutaneous right subclavian venous access was obtained using Seldinger technique, by which local anesthetic was injected over the right subclavian region, and access needle was inserted into the SCV vein, through which soft guidewire was advanced, over which access needle was withdrawn. Length of catheter  needed to position the catheter tip at the atrio-caval junction was then measured under direct fluoroscopic visualization, after which the catheter was cut to the measured length. Guidewire was secured, attention was directed to injection of local anesthetic along the planned port site, 2-3 cm transverse ipsilateral chest incision was made and confirmed to accommodate the subcutaneous port, and flushed measured catheter was attached to port, then the port was placed within the subcutaneous pocket. Insertion sheath was advanced over the guidewire, which was withdrawn along with the insertion sheath dilator.  The catheter was then advanced through the sheath into the SCV and SVC.  Port was confirmed to withdraw blood and flush easily, after which concentrated heparin  was instilled into the port and catheter. Dermis at the subcutaneous pocket was re-approximated using buried interrupted 3-0 Vicryl suture, and 4-0 Monocryl suture was used to re-approximate skin at the insertion/subcutaneous port site in running subcuticular fashion for the subcutaneous port and buried interrupted fashion for the insertion site.  Using the straight Huebner needle the port was flushed with heparin  at 1000 units/mL 4 to 5 mL was injected through the port.  Skin was cleaned, dried, and sterile skin glue was applied. Patient was then safely transferred to PACU for a chest x-ray.  I was present for all aspects of the procedures, and no intraprocedural complications were apparent.   [name], M.D., [facility] 06/28/2024 12:54 PM

## 2024-06-28 NOTE — Anesthesia Preprocedure Evaluation (Addendum)
 Anesthesia Evaluation  Patient identified by MRN, date of birth, ID band Patient awake    Reviewed: Allergy & Precautions, NPO status , Patient's Chart, lab work & pertinent test results  History of Anesthesia Complications Negative for: history of anesthetic complications  Airway Mallampati: III   Neck ROM: Full    Dental no notable dental hx.    Pulmonary sleep apnea    Pulmonary exam normal breath sounds clear to auscultation       Cardiovascular hypertension, + CAD (s/p MI and stents on Eliquis )  Normal cardiovascular exam Rhythm:Regular Rate:Normal  ECG 06/08/24: SR; PVCs; old inferior infarct  Echo 04/23/22:  ABNORMAL STRESS ECHOCARDIOGRAM  NORMAL RIGHT VENTRICULAR SYSTOLIC FUNCTION  MILD VALVULAR REGURGITATION   NO VALVULAR STENOSIS NOTED  HIGH RISK STRESS TEST - HYPOKINESIS OF MULTIPLE VASCULAR TERRITORIES  FREQUENT MULTFORM PVC'S WITH EXERTION    Neuro/Psych Alcohol use disorder, none used in past month    GI/Hepatic ,GERD  ,,Hepatocellular carcinoma   Endo/Other  diabetes, Type 2    Renal/GU negative Renal ROS     Musculoskeletal   Abdominal   Peds  Hematology negative hematology ROS (+)   Anesthesia Other Findings Cardiology note 04/06/24:  1. CAD s/p PCI Successful PCI LAD-D2 with 2.75x28 DES dLAD, 3.0x24 DES mLAD. Posted with 3.0, 3.5, 4.0, 4.5 [place] balloons. Asymptomatic.  Recommendations: Discontinue Plavix  Continue ASA monotherapy  2. Hyperlipidemia Last Lipids: Lab Results  Component Value Date  CHOLTOTAL 202 (H) 09/02/2023  LDLCALC 124 09/02/2023  HDL 50.8 09/02/2023  TRIG 861 09/02/2023  Recommendations: Crestor  40 mg daily  Unable to afford [id number] Start EZE 10 QD  3. T2DM Lab Results  Component Value Date  HGBA1C 5.9 (H) 09/02/2023  HGBA1C 6.0 (H) 01/26/2023  Recommendations: Continue Jardiance   4. HTN Recommendations: BP log at home Continue Zestoretic  20-12.5  5.  Fatigue TSH elevated on repeat checks. No recent changes in synthroid  dose per patient report. Will discuss with PCP.   Disposition:RTC 12 months    Reproductive/Obstetrics                              Anesthesia Physical Anesthesia Plan  ASA: 3  Anesthesia Plan: General   Post-op Pain Management:    Induction: Intravenous  PONV Risk Score and Plan: 2 and Propofol  infusion, TIVA and Treatment may vary due to age or medical condition  Airway Management Planned: Natural Airway  Additional Equipment:   Intra-op Plan:   Post-operative Plan:   Informed Consent: I have reviewed the patients History and Physical, chart, labs and discussed the procedure including the risks, benefits and alternatives for the proposed anesthesia with the patient or authorized representative who has indicated his/her understanding and acceptance.       Plan Discussed with: CRNA  Anesthesia Plan Comments: (LMA/GETA backup discussed.  Patient consented for risks of anesthesia including but not limited to:  - adverse reactions to medications - damage to eyes, teeth, lips or other oral mucosa - nerve damage due to positioning  - sore throat or hoarseness - damage to heart, brain, nerves, lungs, other parts of body or loss of life  Informed patient about role of CRNA in peri- and intra-operative care.  Patient voiced understanding.)         Anesthesia Quick Evaluation

## 2024-06-28 NOTE — Anesthesia Postprocedure Evaluation (Signed)
 Anesthesia Post Note  Patient: Nicholas Abbott  Procedure(s) Performed: INSERTION, TUNNELED CENTRAL VENOUS DEVICE, WITH PORT (Right: Chest)  Patient location during evaluation: PACU Anesthesia Type: General Level of consciousness: awake and alert, oriented and patient cooperative Pain management: pain level controlled Vital Signs Assessment: post-procedure vital signs reviewed and stable Respiratory status: spontaneous breathing, nonlabored ventilation and respiratory function stable Cardiovascular status: blood pressure returned to baseline and stable Postop Assessment: adequate PO intake Anesthetic complications: no   There were no known notable events for this encounter.   Last Vitals:  Vitals:   06/28/24 1330 06/28/24 1352  BP: 134/79 135/84  Pulse: 69 68  Resp: 15 15  Temp: (!) 36.1 C   SpO2: 100% 99%    Last Pain:  Vitals:   06/28/24 1352  TempSrc:   PainSc: 0-No pain                 Alfonso Ruths

## 2024-06-29 ENCOUNTER — Encounter: Payer: Self-pay | Admitting: Surgery

## 2024-06-30 ENCOUNTER — Telehealth: Payer: Self-pay | Admitting: Oncology

## 2024-06-30 ENCOUNTER — Other Ambulatory Visit: Payer: Self-pay | Admitting: Oncology

## 2024-06-30 NOTE — Telephone Encounter (Signed)
 Pt spouse called and said they have transferred care to [facility] - wished to cancel all appts with Finn - LH

## 2024-07-02 NOTE — Anesthesia Postprocedure Evaluation (Signed)
 Anesthesia Post Note  Patient: Nicholas Abbott  Procedure(s) Performed: ERCP, WITH INTERVENTION IF INDICATED INSERTION, STENT, BILE DUCT EGD, WITH ARGON PLASMA COAGULATION  Patient location during evaluation: PACU Anesthesia Type: General Level of consciousness: awake and alert Pain management: pain level controlled Vital Signs Assessment: post-procedure vital signs reviewed and stable Respiratory status: spontaneous breathing, nonlabored ventilation, respiratory function stable and patient connected to nasal cannula oxygen Cardiovascular status: blood pressure returned to baseline and stable Postop Assessment: no apparent nausea or vomiting Anesthetic complications: no   No notable events documented.   Last Vitals:  Vitals:   06/20/24 1959 06/21/24 0255  BP: 127/84 (!) 141/85  Pulse: 66 66  Resp: 18 20  Temp: 36.9 C 36.9 C  SpO2: 100% 97%    Last Pain:  Vitals:   06/21/24 0859  TempSrc:   PainSc: 0-No pain                 Prentice Murphy

## 2024-07-04 ENCOUNTER — Inpatient Hospital Stay

## 2024-07-06 ENCOUNTER — Inpatient Hospital Stay

## 2024-07-06 ENCOUNTER — Inpatient Hospital Stay: Admitting: Oncology

## 2024-07-10 LAB — CULTURE, FUNGUS WITHOUT SMEAR

## 2024-07-11 NOTE — Progress Notes (Signed)
 Pharmacy: First Cycle Immunotherapy Patient Education  Immunotherapy regimen/agents: tremelimumab and durvalumab IV Access: port Caregivers present for education: wife and friend  Nicholas Abbott is a 73 y.o. year old with [facility] who will receive their first cycle of immunotherapy. Patient education reviewing the chemotherapy is provided to the patient by the oncology pharmacy team.   For each of the following items listed below and where applicable, signs and symptoms of adverse effects as well as self-care management were reviewed.  Adverse effects discussed included:  Nausea/vomiting Fatigue Muscle/Joint pain Diarrhea Colitis Hepatitis Rash Pneumonitis Hypophysitis Nephritis Neuritis Opthalmic changes   The patient verbalized understanding of this information. Medication reconciliation was performed and the patient's medication list was updated in Epic.   The patient was given educational documentation that included resources from [employer] and [employer].  Approximate face time spent with patient: 20 minutes.  Rosaline Rimes, PharmD, CPP

## 2024-07-11 NOTE — Progress Notes (Signed)
 [facility] GI MEDICAL ONCOLOGY RETURN VISIT     PATIENT IDENTIFICATION  Nicholas Abbott is a 73 y.o. 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 AFP=21.5 06/09/24, MRI abdomen [facility] - Cirrhosis with portal hypertension. Multiple hepatic lesions involving all liver segments c/f multifocal HCC with the largest measuring 9.3 cm. Enhancing thrombus within the L PV, LR-TIV. New pericholecystic fat stranding and fluid with a stone in the gallbladder neck c/f acute cholecystitis.  06/10/24, lap subtotal cholecystectomy with reconsitution. Intra op noted Highly vascularized/omental encasement of gallbladder. Large umbilical vein. Extensive collateralization of soft tissue vessels to gallbladder itself and surrounding soft tissues with increasing frequency as we progressed toward the neck of the gallbladder.  JP drain placed. Surgical pathology showed acute cholecystitis. Cholelithaisis.  06/10/24, CA 19-9=45 06/12/24, HIDA scan showed contained small bile leak at the level of the cystic duct remnant. 06/14/24, US -guided liver biopsy; Path demonstrated moderately differentiated hepatocellular carcinoma.  06/15/24, ERCP done for bile leak - biliary sphincterotomy and one plastic stent was placed into the bile duct. .  06/19/24, US -guided paracentesis; 190 ml of perihepatic fluid removed. Cytology negative for malignant cells. 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 Labs are adequate to treat. We reviewed common side effects from immunotherapy again today.   PLAN - Proceed with C1 durva+treme today - RTC in 4 weeks for labs, follow up and infusion - Plan restaging upon completion of 3 months of STRIDE.   # Leukocytosis: WBC count was 20.2 on 06/27/24. Flow cytometry showed atypical lymphocytosis involved by CD5-/CD10- B cells representing 50% of the WBC. WBC today normalized. Lymphocytosis persisted but improved from 8/29.  - Referral to Malignant Hematology made  # Bile leak s/p ERCP sphincterotomy and plastic stent placement into the CBD on 06/15/24,  - Repeat ERCP scheduled on 09/11/24 to remove the stent.    # Decompensated cirrhosis Decompensated cirrhosis with portal hypertension, splenomegaly, large varices and ascites. - Furosemide  20 mg po daily and Spironolactone 50 mg po daily for ascites given soft BP - Metoprolol  discontinued - Carvedilol 3.125 mg po twice daily given soft BP - Consider upper endoscopy for esophageal varices. - Referral to Dr. Prentice Feeling per patient's request   # Coronary artery disease with prior stent placement Coronary artery disease with prior stent placement. Not on metoprolol  due to recent hospitalization. Blood pressure well controlled but may increase with treatment regimens. - Management per PCP   # Type 2 diabetes mellitus - Management per PCP   # Hypothyroidism Longstanding hypothyroidism managed by PCP. Potential thyroid dysfunction with immunotherapy. - Monitor thyroid function tests regularly during immunotherapy. - Management per PCP  No orders of the defined types were placed in this encounter.   The natural history of cancer was  reviewed and discussed with the patient and family members. Treatment options as well as risks and benefits were discussed with the  patient/family. Patient and family member's questions answered to their satisfaction.   I spent a total of 55 minutes in both face-to-face and non-face-to-face activities for this visit on the date of this encounter.  All documented time was specific to the E/M visit and does not include any procedures that may have been performed. Medical decision making based high complexity of cancer, co-morbidities and treatment risk of complications and monitoring toxicity of chemotherapy.     INTERVAL HISTORY  Nicholas Abbott is here for follow up.   History of Present Illness Nicholas Abbott is a 73 year old male who presents for his first immunotherapy infusion. He was referred by hematology for evaluation of elevated white blood cell count.  He has a history of elevated white blood cell count, which he attributes to trauma, beginning last year. The count was stable until August when he was hospitalized, after which it increased significantly. Currently, the count is down to 11,000. He has not received any communication from hematology regarding this issue.  He has a history of thyroid issues, with a TSH level around 9, which he states is his baseline. His thyroid management is overseen by Dr. Thressa at [facility]. Recently, a second thyroid medication was added approximately six to eight weeks ago to address out-of-range numbers. He reports that his thyroid problem has not been a major issue, and adjustments are being made to bring the numbers into range.  He has a history of liver enzyme elevation, with AST, ALT, and alkaline phosphatase levels reported as stable to slightly improved. He has discontinued his statin and blood pressure medications since August due to concerns about liver side effects and reports that his blood pressure has remained manageable without medication.  He is planning upcoming travel, including a beach trip and a cruise, and is coordinating his treatment  schedule around these plans. He inquires about the genetic implications of his condition for his children.  He expresses difficulty accessing his medical team through MyChart and is seeking assistance to resolve this issue. He also has questions about medication management, particularly regarding the timing and necessity of certain prescriptions.    REVIEW OF SYSTEMS  The remainder of a comprehensive 10 systems review is negative.   Allergies and Medications reviewed as per chart  PHYSICAL EXAMS  There were no vitals filed for this visit.        07/11/24 (!) 109.9 kg (242 lb 4.6 oz)  06/30/24 (!) 103.8 kg (228 lb 12.8 oz)  11/18/11 (!) 122.5 kg (270 lb 1 oz)    ECOG: (1) Restricted in physically strenuous activity, ambulatory and able to do work of light nature  GENERAL: well developed, well nourished, in no distress PSYCH: full and appropriate range of affect with good insight and judgement HEENT: NCAT, pupils equal, sclerae anicteric SKIN: No rashes    OBJECTIVE DATA  LABS:  Results for orders placed or performed in visit on 07/11/24  Comprehensive Metabolic Panel  Result Value Ref Range   Sodium 143 135 - 145 mmol/L   Potassium 4.1 3.5 - 5.1 mmol/L   Chloride 107 98 - 107 mmol/L   CO2 24.0 20.0 - 31.0 mmol/L   Anion Gap 12 5 - 14 mmol/L   BUN 24 (H) 9 - 23 mg/dL   Creatinine 9.09 9.26 - 1.18 mg/dL   BUN/Creatinine Ratio 27  eGFR CKD-EPI (2021) Male 90 >=60 mL/min/1.84m2   Glucose 207 (H) 70 - 179 mg/dL   Calcium  8.2 (L) 8.7 - 10.4 mg/dL   Albumin 2.0 (L) 3.4 - 5.0 g/dL   Total Protein 5.7 5.7 - 8.2 g/dL   Total Bilirubin 1.0 0.3 - 1.2 mg/dL   AST 864 (H) <=65 U/L   ALT 126 (H) 10 - 49 U/L   Alkaline Phosphatase 198 (H) 46 - 116 U/L  AFP tumor marker  Result Value Ref Range   AFP-Tumor Marker 16 (H) <=8 ng/mL  TSH  Result Value Ref Range   TSH 9.129 (H) 0.550 - 4.780 uIU/mL  T4, free  Result Value Ref Range   Free T4 1.14 0.89 - 1.76 ng/dL  CBC w/  Differential  Result Value Ref Range   WBC 11.2 3.6 - 11.2 10*9/L   RBC 4.18 (L) 4.26 - 5.60 10*12/L   HGB 14.3 12.9 - 16.5 g/dL   HCT 57.3 60.9 - 51.9 %   MCV 101.8 (H) 77.6 - 95.7 fL   MCH 34.2 (H) 25.9 - 32.4 pg   MCHC 33.6 32.0 - 36.0 g/dL   RDW 83.4 (H) 87.7 - 84.7 %   MPV 9.8 6.8 - 10.7 fL   Platelet 122 (L) 150 - 450 10*9/L   Neutrophils % 36.1 %   Lymphocytes % 52.7 %   Monocytes % 7.1 %   Eosinophils % 3.1 %   Basophils % 1.0 %   Absolute Neutrophils 4.0 1.8 - 7.8 10*9/L   Absolute Lymphocytes 5.9 (H) 1.1 - 3.6 10*9/L   Absolute Monocytes 0.8 0.3 - 0.8 10*9/L   Absolute Eosinophils 0.3 0.0 - 0.5 10*9/L   Absolute Basophils 0.1 0.0 - 0.1 10*9/L   Macrocytosis Slight (A) Not Present   Anisocytosis Slight (A) Not Present   RADIOLOGY RESULTS  None.    CARE TEAM     FOLLOW UP   Future Appointments  Date Time Provider Department Center  08/08/2024 12:30 PM ADULT ONC LAB [facility]  08/08/2024  1:30 PM Mitchel France, MD [id number]  08/08/2024  2:30 PM ONCINF CHAIR 39 [id number]  09/05/2024 10:30 AM ADULT ONC LAB [facility]  09/05/2024 11:30 AM ONCINF CHAIR 46 [id number]  10/03/2024 10:30 AM ADULT ONC LAB [facility]  10/03/2024 11:30 AM ONCINF CHAIR 40 [id number]

## 2024-07-11 NOTE — Progress Notes (Signed)
 Clinical Pharmacist Brief Note:  Revised treatment conditions in treatment plan to reflect instruction to notify provider if AST/ALT is >3x baseline (rather than >3x ULN).  Russell Acosta, PharmD, BCOP, CPP GI Oncology Clinical Pharmacist Practitioner

## 2024-07-12 ENCOUNTER — Telehealth: Payer: Self-pay | Admitting: Surgery

## 2024-07-12 NOTE — Telephone Encounter (Signed)
 Pt said he had a Drain removed By [employer] around a week or so ago but pt abdomen is very swollen and is asking is this normal.  He is very concerned of all the fluid that he has. He said he has  had about 12 lbs of fluid in the last few weeks. Pt # is [phone]

## 2024-07-12 NOTE — Telephone Encounter (Signed)
 Call to speak with the patient, he states that Dr Lane had already called and spoken to him.

## 2024-07-26 ENCOUNTER — Ambulatory Visit: Admitting: Oncology

## 2024-07-26 ENCOUNTER — Ambulatory Visit

## 2024-07-26 ENCOUNTER — Other Ambulatory Visit

## 2024-08-09 NOTE — Telephone Encounter (Signed)
 Fax received from Dr Jones-cardiology clearing pt for procedure. Pt will be done with the 60 days of Eliquis  prior to the procedure

## 2024-08-16 ENCOUNTER — Ambulatory Visit

## 2024-08-16 ENCOUNTER — Other Ambulatory Visit

## 2024-08-16 ENCOUNTER — Ambulatory Visit: Admitting: Oncology

## 2024-09-06 ENCOUNTER — Other Ambulatory Visit

## 2024-09-06 ENCOUNTER — Ambulatory Visit

## 2024-09-06 ENCOUNTER — Ambulatory Visit: Admitting: Oncology

## 2024-09-11 ENCOUNTER — Ambulatory Visit: Admit: 2024-09-11 | Admitting: Gastroenterology

## 2024-09-11 SURGERY — ERCP, WITH INTERVENTION IF INDICATED
Anesthesia: General

## 2024-10-01 NOTE — Progress Notes (Deleted)
 [facility] [address] [place], [place] [address]  Pulmonary Sleep Medicine   Office Visit Note  Patient Name: Nicholas Abbott DOB: 1951-04-11 MRN [id number]    Chief Complaint: Obstructive Sleep Apnea visit  Brief History:  Nicholas Abbott is seen today for an annual follow up visit for CPAP@ 10 cmH2O. The patient has a 7 year history of sleep apnea. Patient is using PAP nightly.  The patient feels rested after sleeping with PAP.  The patient reports benefiting from PAP use. Reported sleepiness is  improved and the Epworth Sleepiness Score is *** out of 24. The patient *** take naps. The patient complains of the following: ***  The compliance download shows 78% compliance with an average use time of 8 hours 34 minutes. The AHI is 2.7.  The patient *** of limb movements disrupting sleep. The patient continues to require PAP therapy in order to eliminate sleep apnea.   ROS  General: (-) fever, (-) chills, (-) night sweat Nose and Sinuses: (-) nasal stuffiness or itchiness, (-) postnasal drip, (-) nosebleeds, (-) sinus trouble. Mouth and Throat: (-) sore throat, (-) hoarseness. Neck: (-) swollen glands, (-) enlarged thyroid, (-) neck pain. Respiratory: *** cough, *** shortness of breath, *** wheezing. Neurologic: *** numbness, *** tingling. Psychiatric: *** anxiety, *** depression   Current Medication: Outpatient Encounter Medications as of 10/02/2024  Medication Sig   acetaminophen  (TYLENOL ) 500 MG tablet Take 1,000 mg by mouth every 8 (eight) hours as needed.   apixaban  (ELIQUIS ) 5 MG TABS tablet Take 2 tablets (10 mg total) by mouth 2 (two) times daily for 5 days, THEN 1 tablet (5 mg total) 2 (two) times daily.   Blood Glucose Monitoring Suppl DEVI 1 each by Does not apply route in the morning, at noon, and at bedtime. May substitute to any manufacturer covered by patient's insurance.   Cinnamon 500 MG capsule Take 1,000 mg by mouth daily.   Cyanocobalamin  (VITAMIN B-12)  5000 MCG TBDP Take 5,000 mcg by mouth daily.    empagliflozin  (JARDIANCE ) 10 MG TABS tablet Take 1 tablet by mouth daily with breakfast.   ezetimibe (ZETIA) 10 MG tablet Take 10 mg by mouth daily.   folic acid -pyridoxine -cyancobalamin (FOLTX) 2.5-25-2 MG TABS tablet Take 1 tablet by mouth daily.   hydrocortisone  2.5 % cream APPLY TO THE GROIN DAILY ON MONDAY, WEDNESDAY, AND FRIDAY.   ketoconazole  (NIZORAL ) 2 % cream APPLY TO THE GROIN AT BEDTIME   levothyroxine  (SYNTHROID ) 112 MCG tablet Take 112 mcg by mouth daily.   liothyronine  (CYTOMEL ) 5 MCG tablet Take 5 mcg by mouth every morning.   magnesium gluconate (MAGONATE) 500 (27 Mg) MG TABS tablet Take 500 mg by mouth in the morning and at bedtime.   methocarbamol  (ROBAXIN ) 500 MG tablet Take 1 tablet (500 mg total) by mouth every 8 (eight) hours as needed for muscle spasms.   nitroGLYCERIN  (NITROSTAT ) 0.4 MG SL tablet Place 0.4 mg under the tongue every 5 (five) minutes as needed for chest pain.   PHOSPHATIDYLSERINE PO Take by mouth.   rosuvastatin  (CRESTOR ) 40 MG tablet Take by mouth.   tadalafil (CIALIS) 5 MG tablet Take 5 mg by mouth daily as needed for erectile dysfunction.   traMADol  (ULTRAM ) 50 MG tablet Take 1-2 tablets (50-100 mg total) by mouth every 6 (six) hours as needed for severe pain (pain score 7-10) or moderate pain (pain score 4-6).   vitamin E 180 MG (400 UNITS) capsule Take 400 Units by mouth daily.  No facility-administered encounter medications on file as of 10/02/2024.    Surgical History: Past Surgical History:  Procedure Laterality Date   BILIARY STENT PLACEMENT  06/15/2024   Procedure: INSERTION, STENT, BILE DUCT;  Surgeon: Jinny Carmine, MD;  Location: [facility];  Service: Endoscopy;;   CATARACT EXTRACTION     COLONOSCOPY WITH PROPOFOL  N/A 01/01/2020   Procedure: COLONOSCOPY WITH PROPOFOL ;  Surgeon: [name], [name], MD;  Location: [facility] END[facility];  Service: Gastroenterology;  Laterality: N/A;   CORONARY  ANGIOPLASTY     ERCP N/A 06/15/2024   Procedure: ERCP, WITH INTERVENTION IF INDICATED;  Surgeon: Jinny Carmine, MD;  Location: [facility];  Service: Endoscopy;  Laterality: N/A;   ESOPHAGOGASTRODUODENOSCOPY (EGD) WITH PROPOFOL  N/A 01/01/2020   Procedure: ESOPHAGOGASTRODUODENOSCOPY (EGD) WITH PROPOFOL ;  Surgeon: [name], [name], MD;  Location: [facility];  Service: Gastroenterology;  Laterality: N/A;   EYE SURGERY     HOT HEMOSTASIS  06/15/2024   Procedure: EGD, WITH ARGON PLASMA COAGULATION;  Surgeon: Jinny Carmine, MD;  Location: [facility];  Service: Endoscopy;;   IR US  GUIDE BX ASP/DRAIN  06/19/2024   IR US  LIVER BIOPSY  06/14/2024   LEFT HEART CATH AND CORONARY ANGIOGRAPHY N/A 04/29/2022   Procedure: LEFT HEART CATH AND CORONARY ANGIOGRAPHY;  Surgeon: [name], MD;  Location: [facility] INVASIVE CV LAB;  Service: Cardiovascular;  Laterality: N/A;   PORTACATH PLACEMENT Right 06/28/2024   Procedure: INSERTION, TUNNELED CENTRAL VENOUS DEVICE, WITH PORT;  Surgeon: Lane Shope, MD;  Location: [facility] ORS;  Service: General;  Laterality: Right;   RETINAL DETACHMENT SURGERY      Medical History: Past Medical History:  Diagnosis Date   Basal cell carcinoma    L ear, txted in past   Diabetes mellitus without complication (HCC)    GERD (gastroesophageal reflux disease)    Hyperlipidemia    Hypertension    Left shoulder pain    Myocardial infarction (HCC)     Family History: Non contributory to the present illness  Social History: Social History   Socioeconomic History   Marital status: Married    Spouse name: Not on file   Number of children: Not on file   Years of education: Not on file   Highest education level: Not on file  Occupational History   Not on file  Tobacco Use   Smoking status: Never   Smokeless tobacco: Never  Vaping Use   Vaping status: Never Used  Substance and Sexual Activity   Alcohol use: Yes    Alcohol/week: 5.0 standard drinks of alcohol     Types: 5 Shots of liquor per week   Drug use: Never   Sexual activity: Yes    Birth control/protection: Post-menopausal  Other Topics Concern   Not on file  Social History Narrative   Not on file   Social Drivers of Health   Financial Resource Strain: Low Risk (09/19/2024)   Received from [facility]   Overall Financial Resource Strain (CARDIA)    How hard is it for you to pay for the very basics like food, housing, medical care, and heating?: Not very hard  Food Insecurity: No Food Insecurity (08/15/2024)   Received from [facility]   Hunger Vital Sign    Within the past 12 months, you worried that your food would run out before you got the money to buy more.: Never true    Within the past 12 months, the food you bought just didn't last and you didn't have  money to get more.: Never true  Transportation Needs: No Transportation Needs (08/15/2024)   Received from [facility] - Transportation    In the past 12 months, has lack of transportation kept you from medical appointments or from getting medications?: No    Lack of Transportation (Non-Medical): No  Physical Activity: Not on file  Stress: Not on file  Social Connections: Socially Integrated (06/08/2024)   Social Connection and Isolation Panel    Frequency of Communication with Friends and Family: More than three times a week    Frequency of Social Gatherings with Friends and Family: More than three times a week    Attends Religious Services: 1 to 4 times per year    Active Member of [employer] or Organizations: Yes    Attends Banker Meetings: More than 4 times per year    Marital Status: Married  Catering Manager Violence: Not At Risk (06/26/2024)   Humiliation, Afraid, Rape, and Kick questionnaire    Fear of Current or Ex-Partner: No    Emotionally Abused: No    Physically Abused: No    Sexually Abused: No    Vital Signs: There were no vitals taken for this  visit. There is no height or weight on file to calculate BMI.    Examination: General Appearance: The patient is well-developed, well-nourished, and in no distress. Neck Circumference: 46 cm Skin: Gross inspection of skin unremarkable. Head: normocephalic, no gross deformities. Eyes: no gross deformities noted. ENT: ears appear grossly normal Neurologic: Alert and oriented. No involuntary movements.  STOP BANG RISK ASSESSMENT S (snore) Have you been told that you snore?     NO   T (tired) Are you often tired, fatigued, or sleepy during the day?   NO  O (obstruction) Do you stop breathing, choke, or gasp during sleep? NO   P (pressure) Do you have or are you being treated for high blood pressure? YES   B (BMI) Is your body index greater than 35 kg/m? YES   A (age) Are you 97 years old or older? YES   N (neck) Do you have a neck circumference greater than 16 inches?   YES   G (gender) Are you a male? YES   TOTAL STOP/BANG "YES" ANSWERS 5       A STOP-Bang score of 2 or less is considered low risk, and a score of 5 or more is high risk for having either moderate or severe OSA. For people who score 3 or 4, doctors may need to perform further assessment to determine how likely they are to have OSA.         EPWORTH SLEEPINESS SCALE:  Scale:  (0)= no chance of dozing; (1)= slight chance of dozing; (2)= moderate chance of dozing; (3)= high chance of dozing  Chance  Situtation    Sitting and reading: ***    Watching TV: ***    Sitting Inactive in public: ***    As a passenger in car: ***      Lying down to rest: ***    Sitting and talking: ***    Sitting quielty after lunch: ***    In a car, stopped in traffic: ***   TOTAL SCORE:   *** out of 24    SLEEP STUDIES:  PSG (03/2017) AHI 73/hr, min SpO2 82% Titration (04/2018) CPAP@ 9 cmH2O   CPAP COMPLIANCE DATA:  Date Range: 09/28/2023-09/26/2024  Average Daily Use: 8 hours 34 minutes  Median Use: 8  hours 53 minutes  Compliance for > 4 Hours: 78%  AHI: 2.7 respiratory events per hour  Days Used: 301/365 days  Mask Leak: 35.5  95th Percentile Pressure: 10         LABS: No results found for this or any previous visit (from the past 2160 hours).  Radiology: DG CHEST PORT 1 VIEW Result Date: 06/28/2024 CLINICAL DATA:  Port-A-Cath placement. EXAM: PORTABLE CHEST 1 VIEW COMPARISON:  June 11, 2024. FINDINGS: The heart size and mediastinal contours are within normal limits. Interval placement of right subclavian Port-A-Cath with distal tip in expected position of the SVC. No pneumothorax is noted. Mild bibasilar subsegmental atelectasis is noted. The visualized skeletal structures are unremarkable. IMPRESSION: Interval placement of right subclavian Port-A-Cath with distal tip in expected position of the SVC. Electronically Signed   By: Lynwood Landy Raddle M.D.   On: 06/28/2024 17:06   DG C-Arm 1-60 Min-No Report Result Date: 06/28/2024 Fluoroscopy was utilized by the requesting physician.  No radiographic interpretation.    No results found.  No results found.    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   Calculus of gallbladder with acute cholecystitis without obstruction 06/10/2024   Cirrhosis of liver with ascites (HCC) 06/10/2024   Liver masses 06/10/2024   Abdominal pain 06/08/2024   Closed nondisplaced fracture of fifth cervical vertebra with routine healing 10/25/2023   Pain in joint of right shoulder 10/25/2023   OSA on CPAP 08/30/2023   CPAP use counseling 08/30/2023   S/P angioplasty with stent 05/01/2022   GERD (gastroesophageal reflux disease) 04/28/2021   Sleep apnea 04/28/2021   Hyperlipidemia 04/28/2021   Hyperlipidemia    Hypertension    Diabetes mellitus without complication (HCC)    CAD  (coronary artery disease)    Thrombocytopenia    Acute metabolic encephalopathy    Hypoglycemia    Type 2 diabetes mellitus without complication, without long-term current use of insulin  (HCC) 02/11/2017   Combined arterial insufficiency and corporo-venous occlusive erectile dysfunction 02/10/2017   Coronary artery disease 06/06/2014   Hypertension 06/06/2014   Obesity 05/30/2012   Myocardial infarction (HCC) 03/02/2000      The patient *** tolerate PAP and reports *** benefit from PAP use. The patient was reminded how to *** and advised to ***. The patient was also counselled on ***. The compliance is ***. The AHI is ***.   ***  General Counseling: I have discussed the findings of the evaluation and examination with Nicholas Abbott.  I have also discussed any further diagnostic evaluation thatmay be needed or ordered today. Nicholas Abbott verbalizes understanding of the findings of todays visit. We also reviewed his medications today and discussed drug interactions and side effects including but not limited excessive drowsiness and altered mental states. We also discussed that there is always a risk not just to him but also people around him. he has been encouraged to call the office with any questions or concerns that should arise related to todays visit.  No orders of the defined types were placed in this encounter.       I have personally obtained a history, examined the patient, evaluated laboratory and imaging results, formulated the assessment and plan and placed orders.  Nicholas DELENA Bathe, MD [facility] Diplomate ABMS Pulmonary Critical Care Medicine and Sleep Medicine

## 2024-10-02 ENCOUNTER — Ambulatory Visit

## 2024-11-02 DEATH — deceased

## 2024-12-20 ENCOUNTER — Ambulatory Visit: Payer: Medicare Other | Admitting: Dermatology

## 2025-04-20 ENCOUNTER — Other Ambulatory Visit

## 2025-04-27 ENCOUNTER — Telehealth: Admitting: Oncology
